# Patient Record
Sex: Female | Born: 1940
Health system: Southern US, Community
[De-identification: ages and names within clinical notes are randomized; demographics above are authoritative.]

## PROBLEM LIST (undated history)

## (undated) DIAGNOSIS — C189 Malignant neoplasm of colon, unspecified: Secondary | ICD-10-CM

## (undated) DIAGNOSIS — C50911 Malignant neoplasm of unspecified site of right female breast: Secondary | ICD-10-CM

## (undated) DIAGNOSIS — K219 Gastro-esophageal reflux disease without esophagitis: Secondary | ICD-10-CM

## (undated) DIAGNOSIS — M1990 Unspecified osteoarthritis, unspecified site: Secondary | ICD-10-CM

## (undated) DIAGNOSIS — Z9889 Other specified postprocedural states: Secondary | ICD-10-CM

## (undated) DIAGNOSIS — R7303 Prediabetes: Secondary | ICD-10-CM

## (undated) DIAGNOSIS — K635 Polyp of colon: Secondary | ICD-10-CM

## (undated) DIAGNOSIS — R112 Nausea with vomiting, unspecified: Secondary | ICD-10-CM

## (undated) DIAGNOSIS — I499 Cardiac arrhythmia, unspecified: Secondary | ICD-10-CM

## (undated) DIAGNOSIS — Z923 Personal history of irradiation: Secondary | ICD-10-CM

## (undated) DIAGNOSIS — I10 Essential (primary) hypertension: Secondary | ICD-10-CM

## (undated) DIAGNOSIS — E559 Vitamin D deficiency, unspecified: Secondary | ICD-10-CM

## (undated) DIAGNOSIS — R51 Headache: Secondary | ICD-10-CM

## (undated) DIAGNOSIS — E236 Other disorders of pituitary gland: Secondary | ICD-10-CM

## (undated) DIAGNOSIS — J189 Pneumonia, unspecified organism: Secondary | ICD-10-CM

## (undated) DIAGNOSIS — F32A Depression, unspecified: Secondary | ICD-10-CM

## (undated) DIAGNOSIS — F329 Major depressive disorder, single episode, unspecified: Secondary | ICD-10-CM

## (undated) HISTORY — DX: Vitamin D deficiency, unspecified: E55.9

## (undated) HISTORY — PX: TUMOR REMOVAL: SHX12

## (undated) HISTORY — DX: Malignant neoplasm of colon, unspecified: C18.9

## (undated) HISTORY — PX: CATARACT EXTRACTION: SUR2

## (undated) HISTORY — DX: Prediabetes: R73.03

## (undated) HISTORY — PX: BACK SURGERY: SHX140

## (undated) HISTORY — PX: EYE SURGERY: SHX253

---

## 1986-06-06 HISTORY — PX: COLON RESECTION: SHX5231

## 1989-02-04 HISTORY — PX: COLON SURGERY: SHX602

## 1997-06-06 DIAGNOSIS — I10 Essential (primary) hypertension: Secondary | ICD-10-CM

## 1997-06-06 HISTORY — DX: Essential (primary) hypertension: I10

## 1997-09-09 ENCOUNTER — Other Ambulatory Visit: Admission: RE | Admit: 1997-09-09 | Discharge: 1997-09-09 | Payer: Self-pay | Admitting: *Deleted

## 1997-09-22 ENCOUNTER — Ambulatory Visit (HOSPITAL_COMMUNITY): Admission: RE | Admit: 1997-09-22 | Discharge: 1997-09-22 | Payer: Self-pay | Admitting: Internal Medicine

## 1998-01-02 ENCOUNTER — Ambulatory Visit (HOSPITAL_COMMUNITY): Admission: RE | Admit: 1998-01-02 | Discharge: 1998-01-02 | Payer: Self-pay | Admitting: Gastroenterology

## 1998-03-02 ENCOUNTER — Ambulatory Visit (HOSPITAL_COMMUNITY): Admission: RE | Admit: 1998-03-02 | Discharge: 1998-03-02 | Payer: Self-pay | Admitting: Gastroenterology

## 1998-04-02 ENCOUNTER — Inpatient Hospital Stay (HOSPITAL_COMMUNITY): Admission: RE | Admit: 1998-04-02 | Discharge: 1998-04-06 | Payer: Self-pay | Admitting: *Deleted

## 1999-02-16 ENCOUNTER — Ambulatory Visit (HOSPITAL_COMMUNITY): Admission: RE | Admit: 1999-02-16 | Discharge: 1999-02-16 | Payer: Self-pay | Admitting: Internal Medicine

## 1999-02-16 ENCOUNTER — Encounter: Payer: Self-pay | Admitting: Internal Medicine

## 2001-05-25 ENCOUNTER — Encounter (INDEPENDENT_AMBULATORY_CARE_PROVIDER_SITE_OTHER): Payer: Self-pay | Admitting: Specialist

## 2001-05-25 ENCOUNTER — Ambulatory Visit (HOSPITAL_COMMUNITY): Admission: RE | Admit: 2001-05-25 | Discharge: 2001-05-25 | Payer: Self-pay | Admitting: Gastroenterology

## 2001-06-19 ENCOUNTER — Encounter: Payer: Self-pay | Admitting: Gastroenterology

## 2001-06-19 ENCOUNTER — Ambulatory Visit (HOSPITAL_COMMUNITY): Admission: RE | Admit: 2001-06-19 | Discharge: 2001-06-19 | Payer: Self-pay | Admitting: Gastroenterology

## 2001-07-09 ENCOUNTER — Ambulatory Visit (HOSPITAL_COMMUNITY): Admission: RE | Admit: 2001-07-09 | Discharge: 2001-07-09 | Payer: Self-pay | Admitting: Gastroenterology

## 2002-01-17 ENCOUNTER — Other Ambulatory Visit: Admission: RE | Admit: 2002-01-17 | Discharge: 2002-01-17 | Payer: Self-pay | Admitting: Internal Medicine

## 2002-06-11 ENCOUNTER — Encounter: Payer: Self-pay | Admitting: Emergency Medicine

## 2002-06-11 ENCOUNTER — Observation Stay (HOSPITAL_COMMUNITY): Admission: EM | Admit: 2002-06-11 | Discharge: 2002-06-12 | Payer: Self-pay | Admitting: Emergency Medicine

## 2003-04-14 ENCOUNTER — Ambulatory Visit (HOSPITAL_COMMUNITY): Admission: RE | Admit: 2003-04-14 | Discharge: 2003-04-14 | Payer: Self-pay | Admitting: Internal Medicine

## 2004-05-25 ENCOUNTER — Ambulatory Visit (HOSPITAL_COMMUNITY): Admission: RE | Admit: 2004-05-25 | Discharge: 2004-05-25 | Payer: Self-pay | Admitting: Internal Medicine

## 2004-06-21 ENCOUNTER — Encounter: Admission: RE | Admit: 2004-06-21 | Discharge: 2004-06-21 | Payer: Self-pay | Admitting: Orthopedic Surgery

## 2006-06-19 ENCOUNTER — Encounter: Admission: RE | Admit: 2006-06-19 | Discharge: 2006-06-19 | Payer: Self-pay | Admitting: Orthopaedic Surgery

## 2006-08-30 ENCOUNTER — Ambulatory Visit (HOSPITAL_COMMUNITY): Admission: RE | Admit: 2006-08-30 | Discharge: 2006-08-31 | Payer: Self-pay | Admitting: Orthopaedic Surgery

## 2006-11-01 ENCOUNTER — Other Ambulatory Visit: Admission: RE | Admit: 2006-11-01 | Discharge: 2006-11-01 | Payer: Self-pay | Admitting: Internal Medicine

## 2007-11-12 ENCOUNTER — Ambulatory Visit: Payer: Self-pay

## 2007-12-11 ENCOUNTER — Other Ambulatory Visit: Admission: RE | Admit: 2007-12-11 | Discharge: 2007-12-11 | Payer: Self-pay | Admitting: Internal Medicine

## 2010-10-22 NOTE — Op Note (Signed)
NAMESANDRA, [name] NO.:  [id number]   MEDICAL RECORD NO.:  [id number]          PATIENT TYPE:  OIB   LOCATION:  5031                         FACILITY:  [facility]   PHYSICIAN:  Sharolyn Douglas, M.D.        DATE OF BIRTH:  [birth year]   DATE OF PROCEDURE:  08/31/2006  DATE OF DISCHARGE:                               OPERATIVE REPORT   DIAGNOSIS:  Lumbar spinal stenosis and herniated nucleus pulposus.   PROCEDURE:  L3-4, L4-5 and L5-S1 laminectomy.   SURGEON:  Sharolyn Douglas, MD.   ASSISTANTJill Side Mahar, PA   ANESTHESIA:  General endotracheal.   ESTIMATED BLOOD LOSS:  Minimal.   COMPLICATIONS:  None.  Needle and sponge count correct.   INDICATIONS:  The patient is a pleasant female with severe left lower  extremity radicular pain and neurogenic claudication.  Her imaging  studies demonstrate a large disk rupture at L4-5 with severe stenosis.  She has moderate stenosis at L3-4 and L5-S1.  She now presents for  lumbar laminectomy and diskectomy in hopes of improving her symptoms.  Risk, benefits, alternatives reviewed.   PROCEDURE:  After informed consent she was taken to the operating room.  Underwent general endotracheal anesthesia without difficulty given  prophylactic IV antibiotics.  Carefully turned prone onto the Wilson  frame.  All bony prominences padded.  Face eyes protected at all times.  Back prepped, draped usual sterile fashion.  Midline incision was made  from L3-S1.  Dissection was carried sharply through the deep fascia.  Subperiosteal exposure carried out over the lamina.  The pars  interarticularis was identified at L3, L4 and L5 bilaterally.  Intraoperative x-ray was taken to confirm levels.  We then completed  laminectomy using Leksell rongeur, a high-speed bur and Kerrison  punches, removing the entire lamina of L4 and L5.  We removed the  inferior two thirds of the L3.  We then remove the ligamentum flavum and  completed the lateral recess  decompression of the posterior pedicles  bilaterally.  We found moderate stenosis at L3-4, severe stenosis at L4-  5 and moderate narrowing at L5-S1.  We then turned our attention to  removing the disk herniation at L4-5.  We found that the L5 nerve root  was severely compressed by the disk herniation.  We mobilized nerve root  and thecal sac medially, identified the disk herniation.  It was entered  with 50 blade.  Epstein curette used to decompress the large disk  rupture back into the interspace which was then removed with a  pituitary.  We removed as much of the loose disk material as possible  using the pituitary rongeurs.  Disk space was irrigated several times  using Angiocath irrigator.  Hemostasis was achieved.  We evaluated the  nerve roots L4, L5 and S1 and felt that they were well decompressed.  Gelfoam was left over the exposed epidural space.  Deep fascia was  closed with a running 4-0 Vicryl suture.  Subcutaneous layer closed with  0-0 Vicryl, 2-0 Vicryl followed by running 3-0 subcuticular Vicryl  suture on the skin edges.  Dermabond was applied.  The patient was  turned supine, extubated without difficulty and transferred to recovery  in stable condition able to move her upper and lower extremities.   It should be noted my assistant [facility], PA was present by  procedure including positioning, the exposure, the laminectomy.  She  also assisted with wound closure.      Sharolyn Douglas, M.D.  Electronically Signed     MC/MEDQ  D:  08/31/2006  T:  08/31/2006  Job:  [id number]

## 2010-10-22 NOTE — Procedures (Signed)
[facility]  Patient:    Kristen [name], Kristen [name] [facility] Visit Number: [id number] MRN: [id number]          Service Type: END Location: ENDO Attending Physician:  Louie Bun Dictated by:   Everardo All Madilyn Fireman, M.D. Proc. Date: 05/25/01 Admit Date:  05/25/2001   CC:         Marinus Maw, M.D.   Procedure Report  PROCEDURE: Esophagogastroduodenoscopy with esophageal dilatation.  INDICATIONS FOR PROCEDURE:  Patient undergoing surveillance colonoscopy who has noted an increasing solid food dysphagia with some episodes of forced regurgitation.  DESCRIPTION OF PROCEDURE:  The patient was placed in the left lateral decubitus position then placed on the pulse monitor with continuous low flow oxygen delivered by nasal cannula. She was sedated with 50 mg IV Demerol and 7 mg IV Versed. The Olympus video endoscope was advanced under direct vision into the oropharynx and esophagus. The esophagus was straight and of normal caliber with the squamocolumnar line sharply identified at 38 cm. There was no visible ring, stricture or other abnormality of the distal esophagus or gastroesophageal junction. However there was a significant amount of pooled saliva in the esophagus. All of this raised the question of possible acolasia. The scope passed through the gastroesophageal junction easily and retroflexed view of the cardia revealed no abnormality. The fundus, body, antrum and pylorus all appeared normal. The duodenum was entered and both the bulb and second portion were well inspected and appeared to be within normal limits. A guidewire was placed through the endoscope channel and the scope withdrawn. A single 17 mm Savary dilator was passed over the guidewire without any significant resistance and no blood seen on withdrawal. The patient was then prepared for colonoscopy to be done immediately after this procedure. She tolerated the procedure well and there were  no immediate complications.  IMPRESSION:  Essentially normal esophagus with no visible ring or stricture to account for her dysphagia status post empiric dilatation.  PLAN:  If does not respond to todays therapy will need workup for acolasia with barium swallow and possibly manometry. Dictated by:   Everardo All Madilyn Fireman, M.D. Attending Physician:  Louie Bun DD:  05/25/01 TD:  05/26/01 Job: [id number]

## 2010-10-22 NOTE — Procedures (Signed)
[facility]  Patient:    Kristen [name], Kristen [name] [facility] Visit Number: [id number] MRN: [id number]          Service Type: END Location: ENDO Attending Physician:  Louie Bun Dictated by:   Everardo All Madilyn Fireman, M.D. Proc. Date: 05/25/01 Admit Date:  05/25/2001   CC:         Marinus Maw, M.D.   Procedure Report  PROCEDURE:  Colonoscopy with polypectomy.  INDICATION FOR PROCEDURE:  History of sessile proximal cecal polyp requiring surgical removal one year ago.  DESCRIPTION OF PROCEDURE:  The patient was placed in the left lateral decubitus position then placed on the pulse monitor with continuous low flow oxygen delivered by nasal cannula. She was sedated with 20 mg IV Demerol and 1 mg IV Versed in addition to the medicine given for the previous EGD. The Olympus video colonoscope was inserted into the rectum and advanced to the cecum, confirmed by transillumination at McBurneys point and visualization of the ileocecal valve and appendiceal orifice. The prep was good. The cecum appeared normal. Within the ascending colon, there was an 8 mm bilobulated polyp which was removed by snare. The remainder of the ascending and transverse colon appeared normal. I should mention that although there appeared to be very little length of resection, there was an ileocolonic anastomosis that was easily identified rather than intact cecum. Within the descending colon, there was seen one more sessile polyp removed by hot biopsy. In the proximal rectum, two diminutive polyps were fulgurated in place with the hot biopsy forceps. No other abnormalities were seen down to the anus. The colonoscope was then withdrawn and the patient returned to the recovery room in stable condition. The patient tolerated the procedure well and there were no immediate complications.  IMPRESSION:  1. Several small colon polyps.  2. Intact ileocolonic anastomosis.  PLAN:  Await biopsy  results and will probably pursue repeat colonoscopy in three years. Dictated by:   Everardo All Madilyn Fireman, M.D. Attending Physician:  Louie Bun DD:  05/25/01 TD:  05/26/01 Job: [id number]

## 2010-10-22 NOTE — Discharge Summary (Signed)
[name], [name]                ACCOUNT NO.:  [id number]   MEDICAL RECORD NO.:  [id number]                   PATIENT TYPE:  INP   LOCATION:  2853                                 FACILITY:  [facility]   PHYSICIAN:  Nanetta Batty, M.D.                DATE OF BIRTH:  Feb 29, 1941   DATE OF ADMISSION:  06/11/2002  DATE OF DISCHARGE:  06/12/2002                                 DISCHARGE SUMMARY   ADMISSION DIAGNOSIS:  1. Unstable angina.  2. Hypertension.  3. Gastroesophageal reflux disease.  4. Insomnia/anxiety.   DISCHARGE DIAGNOSES:  1. Chest pain, questionable etiology, probable gastrointestinal.     a. Status post cardiac catheterization, June 12, 2002, which revealed        no coronary artery disease and normal left ventricular function.  2. Hypertension.  3. Gastroesophageal reflux disease.  4. Insomnia/anxiety.   HISTORY OF PRESENT ILLNESS:  The patient is a 70 year old white female who  has no prior cardiac history and no cardiac risk factors except for  hypertension, who presented to [facility] on June 11, 2002 with  complaints of chest pain.   She stated that her chest pain began at about 9 o'clock that morning.  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  She described it as a crushing and pressure.  She had radiation up to her  right neck.  She became diaphoretic, had not developed nausea and emesis.  There was no shortness of breath.  She then developed pain in her left  shoulder to her left elbow and then both arms felt heavy and she felt  extremely weak.   She sat down but had no relief and after about 30 minutes, it finally began  to ease somewhat.  She went home and continued to rest.  However, after a  while, the pressure seemed to be increasing again.   She tried position  changes but had no relief.  She took Maalox and had no relief.  She  continued to feel weak and did not feel well and contacted her primary care  physician, who referred her to the emergency room.   She said that she was given four baby aspirins but no nitroglycerins.  At  the time of our interview, she was having very slight chest discomfort.   At that time, she was stable with blood pressure 140/76, heart rate was 58,  no significant findings on physical exam.  EKG showed sinus rhythm with no  significant ST-T changes.  First set of enzymes was negative and other labs  were stable.   At that time, she was seen and evaluated by Dr. Nanetta Batty.  It was felt  that chest pain was consistent  with unstable angina.  She is currently pain-  free and EKG and enzymes are stable.  We plan to admit her to telemetry,  check serial enzymes and rule out MI.  She will be placed on aspirin, beta  blocker, heparin and nitroglycerin and we plan for definitive cardiac  catheterization in the morning.   HOSPITAL COURSE:  On June 12, 2002, she underwent cardiac catheterization  by Dr. Darlin Priestly.  She was found to have normal coronary arteries,  normal LV function with EF 60%.  She tolerated the procedure well and had no  complications.  He planned for discharge home later in the day if her  balloon site was stable, planned proton pump inhibitor and possible GI  evaluation if she continued to have chest pain.   Later in the afternoon of June 12, 2002, the patient remained stable.  Groin site is well-healed without hematoma or bleed, she has good peripheral  pulses and has remained hemodynamically stable.  She is now deemed stable  for discharge home.   HOSPITAL CONSULTATIONS:  None.   HOSPITAL PROCEDURES:  Cardiac catheterization on June 12, 2002 by Dr.  Lenise Herald.  This revealed normal coronary arteries, normal left  ventricular function, ejection fraction  60%.  She tolerated the procedure  well and had no complications.   LABORATORY DATA:  I do not have a full set of labs available in the chart at  this time, however, at admission, white count was 7.3, hemoglobin 12.0,  hematocrit 35.6, platelets 243,000.  Sodium was 138, potassium 3.7, chloride  106, BUN 12, creatinine 0.6.  Cardiac enzymes are all negative -- CK is 78,  MB 1.6, troponin less than 0.01.   EKG shows normal sinus rhythm, no ST-T change.   DISCHARGE MEDICATIONS:  1. Protonix 40 mg once a day.  2. Medroxyprogesterone 10 mg q.h.s.  3. Furosemide 40 mg a day.  4. Bisoprolol fumarate 10 mg a day.  5. Fluoxetine HCl 40 mg a day.  6. Estradiol 1 mg tablet -- a half tablet a day.   FOLLOWUP:  She is scheduled to see [place] back in the office for a groin check  on January 13th at 9 a.m.   We have reviewed post-catheterization instructions with her.   ACTIVITY:  No strenuous activity, lifting greater than 5 pounds, driving or  sexual activity for three days.    SPECIAL DISCHARGE INSTRUCTIONS:  Contact our office at [phone] if you have  any bleeding or increased pain in your groin site.      Mary B. Remer Macho, P.A.-C.                   Nanetta Batty, M.D.    MBE/MEDQ  D:  06/12/2002  T:  06/13/2002  Job:  [id number]   cc:   Lucky Cowboy, M.D.  [address], [address]  [place], [place] [address]  Fax: [phone]

## 2010-10-22 NOTE — Cardiovascular Report (Signed)
Kristen [name], Kristen [name]                ACCOUNT NO.:  [id number]   MEDICAL RECORD NO.:  [id number]                   [name] TYPE:  INP   LOCATION:  4733                                 FACILITY:  [facility]   PHYSICIAN:  Darlin Priestly, M.D.             DATE OF BIRTH:  07-15-1940   DATE OF PROCEDURE:  06/12/2002  DATE OF DISCHARGE:                              CARDIAC CATHETERIZATION   PROCEDURES:  1. Left heart catheterization.  2. Coronary angiography.  3. Left ventriculogram.   COMPLICATIONS:  None.   INDICATIONS:  Kristen [name] is a 70 year old female, [name] of Dr. Oneta Rack,  admitted on June 11, 2002, with unstable angina.  Kristen [name] developed  substernal chest pain with radiation to Kristen right side of neck associated  with nausea and diaphoresis.  She is subsequently referred for cardiac  catheterization to rule out significant CAD.   DESCRIPTION OF PROCEDURE:  After given informed written consent, Kristen [name]  was brought to Kristen cardiac catheterization lab where his right and left  groins were shaved, prepped, and draped in Kristen usual sterile fashion.  ECG  monitoring was established.  Using modified Seldinger technique, a #6 French  arterial sheath was inserted in Kristen right femoral artery.  A 6 French  diagnostic catheter was then used to perform diagnostic angiography.  This  reveals a long left main with no significant disease.   Kristen LAD is a medium sized vessel which coursed to Kristen apex and gave rise to  two diagonal branches.  Kristen LAD has mild irregularities and high-grade  stenosis.  Kristen first and second diagonals are medium sized vessels with no  significant disease.   Kristen left circumflex is a medium sized vessel which coursed in Kristen AV groove  and gave rise to two obtuse marginal branches.  Kristen AV groove circumflex has  no significant discharge.  Kristen first OM is a large vessel which bifurcates  distally and has no significant disease.  Kristen second OM is  a medium sized  vessel which bifurcates distally and has no significant disease.   Kristen right coronary artery is a large vessel which is dominant and gives rise  to both Kristen PDA as well as Kristen posterolateral branch.  There is no  significant disease in Kristen RCA, PDA, or posterolateral branch.   LEFT VENTRICULOGRAM:  Kristen left ventriculogram reveals preserved EF of 60%.    CONCLUSIONS:  1. No significant coronary artery disease.  2. Normal left ventricular systolic function.                                                  Darlin Priestly, M.D.    RHM/MEDQ  D:  06/12/2002  T:  06/12/2002  Job:  [id number]   cc:   Lucky Cowboy, M.D.  [address],  [address]  [place], [place] [address]  Fax: [phone]   Nanetta Batty, M.D.  [phone] [address]  [place]  [place] [address]  Fax: [phone]

## 2010-10-22 NOTE — Op Note (Signed)
[facility]  Patient:    Kristen [name], Kristen [name] [facility] Visit Number: [id number] MRN: [id number]          Service Type: END Location: ENDO Attending Physician:  Louie Bun Dictated by:   Everardo All Madilyn Fireman, M.D. Proc. Date: 07/09/01 Admit Date:  07/09/2001 Discharge Date: 07/09/2001   CC:         Marinus Maw, M.D.   Operative Report  PROCEDURE:  Esophageal motility study.  INDICATION FOR PROCEDURE:  Dysphagia with EGD and barium swallow suggesting achalasia.  RESULTS:  I. ESOPHAGEAL BODY:  There is 0% peristalsis with approximately 50% each     simultaneous and retrograde contractions.  The contractions are of normal     velocity and duration but borderline low amplitude in all leads.  II. LOWER ESOPHAGEAL SPHINcTER:  Rest LES pressures 45.5% which is minimally     elevated.  Relaxation is 97% which is normal.  IMPRESSION:  Severe dysmotility of esophageal body with borderline hypertensive lower esophageal sphincter.  Overall this does not meet criteria for a full blown achalasia although, I suspect, manometrically her conditions could be defined as diffuse esophageal spasm with probable transposition to achalasia.  PLAN:  Will review treatment options with the patient.  Will hold off on proceeding with Hellers myotomy due to the lack of full criteria for achalasia.  Will probably initiate a calcium channel blocker and consider Botox injection initially. Dictated by:   Everardo All Madilyn Fireman, M.D. Attending Physician:  Louie Bun DD:  07/18/01 TD:  07/18/01 Job: [id number]

## 2011-04-25 ENCOUNTER — Other Ambulatory Visit (HOSPITAL_COMMUNITY): Payer: Self-pay | Admitting: Internal Medicine

## 2011-04-25 ENCOUNTER — Ambulatory Visit (HOSPITAL_COMMUNITY)
Admission: RE | Admit: 2011-04-25 | Discharge: 2011-04-25 | Disposition: A | Discharge status: Home / Self Care | Payer: Medicare Other | Source: Ambulatory Visit | Attending: Internal Medicine | Admitting: Internal Medicine

## 2011-04-25 DIAGNOSIS — W19XXXA Unspecified fall, initial encounter: Secondary | ICD-10-CM | POA: Insufficient documentation

## 2011-04-25 DIAGNOSIS — Z1231 Encounter for screening mammogram for malignant neoplasm of breast: Secondary | ICD-10-CM

## 2011-04-25 DIAGNOSIS — M25519 Pain in unspecified shoulder: Secondary | ICD-10-CM

## 2011-04-25 DIAGNOSIS — M546 Pain in thoracic spine: Secondary | ICD-10-CM | POA: Insufficient documentation

## 2011-05-13 ENCOUNTER — Ambulatory Visit (HOSPITAL_COMMUNITY)
Admission: RE | Admit: 2011-05-13 | Discharge: 2011-05-13 | Disposition: A | Discharge status: Home / Self Care | Payer: Medicare Other | Source: Ambulatory Visit | Attending: Internal Medicine | Admitting: Internal Medicine

## 2011-05-13 DIAGNOSIS — Z1231 Encounter for screening mammogram for malignant neoplasm of breast: Secondary | ICD-10-CM | POA: Insufficient documentation

## 2011-05-17 ENCOUNTER — Other Ambulatory Visit: Payer: Self-pay | Admitting: Internal Medicine

## 2011-05-17 DIAGNOSIS — R928 Other abnormal and inconclusive findings on diagnostic imaging of breast: Secondary | ICD-10-CM

## 2011-06-01 ENCOUNTER — Ambulatory Visit
Admission: RE | Admit: 2011-06-01 | Discharge: 2011-06-01 | Disposition: A | Discharge status: Home / Self Care | Payer: No Typology Code available for payment source | Source: Ambulatory Visit | Attending: Internal Medicine | Admitting: Internal Medicine

## 2011-06-01 DIAGNOSIS — R928 Other abnormal and inconclusive findings on diagnostic imaging of breast: Secondary | ICD-10-CM

## 2011-07-14 DIAGNOSIS — K5730 Diverticulosis of large intestine without perforation or abscess without bleeding: Secondary | ICD-10-CM | POA: Diagnosis not present

## 2011-07-14 DIAGNOSIS — Z09 Encounter for follow-up examination after completed treatment for conditions other than malignant neoplasm: Secondary | ICD-10-CM | POA: Diagnosis not present

## 2011-07-14 DIAGNOSIS — Z86010 Personal history of colonic polyps: Secondary | ICD-10-CM | POA: Diagnosis not present

## 2011-07-14 DIAGNOSIS — Z980 Intestinal bypass and anastomosis status: Secondary | ICD-10-CM | POA: Diagnosis not present

## 2011-07-14 DIAGNOSIS — D126 Benign neoplasm of colon, unspecified: Secondary | ICD-10-CM | POA: Diagnosis not present

## 2011-07-14 DIAGNOSIS — Z800 Family history of malignant neoplasm of digestive organs: Secondary | ICD-10-CM | POA: Diagnosis not present

## 2011-08-19 DIAGNOSIS — D126 Benign neoplasm of colon, unspecified: Secondary | ICD-10-CM | POA: Diagnosis not present

## 2011-08-24 DIAGNOSIS — I10 Essential (primary) hypertension: Secondary | ICD-10-CM | POA: Diagnosis not present

## 2011-08-24 DIAGNOSIS — E559 Vitamin D deficiency, unspecified: Secondary | ICD-10-CM | POA: Diagnosis not present

## 2011-08-24 DIAGNOSIS — E782 Mixed hyperlipidemia: Secondary | ICD-10-CM | POA: Diagnosis not present

## 2011-08-24 DIAGNOSIS — R7309 Other abnormal glucose: Secondary | ICD-10-CM | POA: Diagnosis not present

## 2011-08-24 DIAGNOSIS — Z79899 Other long term (current) drug therapy: Secondary | ICD-10-CM | POA: Diagnosis not present

## 2011-09-20 ENCOUNTER — Encounter (HOSPITAL_COMMUNITY)
Admission: RE | Disposition: A | Discharge status: Home Health | Payer: Self-pay | Source: Ambulatory Visit | Attending: Gastroenterology

## 2011-09-20 ENCOUNTER — Encounter (HOSPITAL_COMMUNITY): Payer: Self-pay | Admitting: *Deleted

## 2011-09-20 ENCOUNTER — Ambulatory Visit (HOSPITAL_COMMUNITY)
Admission: RE | Admit: 2011-09-20 | Discharge: 2011-09-20 | Disposition: A | Discharge status: Home Health | Payer: Medicare Other | Source: Ambulatory Visit | Attending: Gastroenterology | Admitting: Gastroenterology

## 2011-09-20 DIAGNOSIS — K219 Gastro-esophageal reflux disease without esophagitis: Secondary | ICD-10-CM | POA: Diagnosis not present

## 2011-09-20 DIAGNOSIS — I10 Essential (primary) hypertension: Secondary | ICD-10-CM | POA: Diagnosis not present

## 2011-09-20 DIAGNOSIS — Z79899 Other long term (current) drug therapy: Secondary | ICD-10-CM | POA: Diagnosis not present

## 2011-09-20 DIAGNOSIS — D126 Benign neoplasm of colon, unspecified: Secondary | ICD-10-CM | POA: Diagnosis not present

## 2011-09-20 HISTORY — PX: HOT HEMOSTASIS: SHX5433

## 2011-09-20 HISTORY — DX: Gastro-esophageal reflux disease without esophagitis: K21.9

## 2011-09-20 HISTORY — PX: COLONOSCOPY: SHX5424

## 2011-09-20 HISTORY — DX: Pneumonia, unspecified organism: J18.9

## 2011-09-20 HISTORY — DX: Major depressive disorder, single episode, unspecified: F32.9

## 2011-09-20 HISTORY — DX: Depression, unspecified: F32.A

## 2011-09-20 HISTORY — DX: Essential (primary) hypertension: I10

## 2011-09-20 HISTORY — DX: Headache: R51

## 2011-09-20 SURGERY — COLONOSCOPY
Anesthesia: Moderate Sedation

## 2011-09-20 MED ORDER — MIDAZOLAM HCL 5 MG/5ML IJ SOLN
INTRAMUSCULAR | Status: DC | PRN
  Administered 2011-09-20 (×4): 2.5 mg via INTRAVENOUS

## 2011-09-20 MED ORDER — FENTANYL CITRATE 0.05 MG/ML IJ SOLN
INTRAMUSCULAR | Status: AC
  Filled 2011-09-20: qty 4

## 2011-09-20 MED ORDER — FENTANYL NICU IV SYRINGE 50 MCG/ML
INJECTION | INTRAMUSCULAR | Status: DC | PRN
  Administered 2011-09-20 (×4): 25 ug via INTRAVENOUS

## 2011-09-20 MED ORDER — SODIUM CHLORIDE 0.9 % IV SOLN
Freq: Once | INTRAVENOUS | Status: AC
  Administered 2011-09-20: 500 mL via INTRAVENOUS

## 2011-09-20 MED ORDER — MIDAZOLAM HCL 10 MG/2ML IJ SOLN
INTRAMUSCULAR | Status: AC
  Filled 2011-09-20: qty 4

## 2011-09-20 NOTE — Discharge Instructions (Addendum)
No aspirin or ibuprofen for 2 weeks. Dr. Madilyn Fireman will call with pathology results and further recommendations.  YOU HAD AN ENDOSCOPIC PROCEDURE TODAY: Refer to the procedure report that was given to you for any specific questions about what was found during the examination.  If the procedure report does not answer your questions, please call your gastroenterologist to clarify.  YOU SHOULD EXPECT: Some feelings of bloating in the abdomen. Passage of more gas than usual.  Walking can help get rid of the air that was put into your GI tract during the procedure and reduce the bloating. If you had a lower endoscopy (such as a colonoscopy or flexible sigmoidoscopy) you may notice spotting of blood in your stool or on the toilet paper.   DIET: Your first meal following the procedure should be a light meal and then it is ok to progress to your normal diet.  A half-sandwich or bowl of soup is an example of a good first meal.  Heavy or fried foods are harder to digest and may make you feel nasueas or bloated.  Drink plenty of fluids but you should avoid alcoholic beverages for 24 hours.  ACTIVITY: Your care partner should take you home directly after the procedure.  You should plan to take it easy, moving slowly for the rest of the day.  You can resume normal activity the day after the procedure however you should NOT DRIVE or use heavy machinery for 24 hours (because of the sedation medicines used during the test).    SYMPTOMS TO REPORT IMMEDIATELY  A gastroenterologist can be reached at any hour.  Please call your doctor's office for any of the following symptoms:   Following lower endoscopy (colonoscopy, flexible sigmoidoscopy)  Excessive amounts of blood in the stool  Significant tenderness, worsening of abdominal pains  Swelling of the abdomen that is new, acute  Fever of 100 or higher  Following upper endoscopy (EGD, EUS, ERCP)  Vomiting of blood or coffee ground material  New, significant abdominal  pain  New, significant chest pain or pain under the shoulder blades  Painful or persistently difficult swallowing  New shortness of breath  Black, tarry-looking stools  FOLLOW UP: If any biopsies were taken you will be contacted by phone or by letter within the next 1-3 weeks.  Call your gastroenterologist if you have not heard about the biopsies in 3 weeks.  Please also call your gastroenterologist's office with any specific questions about appointments or follow up tests.

## 2011-09-20 NOTE — Op Note (Signed)
[facility] [address] [place], [place]  [address]  COLONOSCOPY PROCEDURE REPORT  PATIENT:  Kristen [name], Kristen [name]  MR#:  [id number] BIRTHDATE:  1940/11/15, 70 yrs. old  GENDER:  female ENDOSCOPIST:  Dorena Cookey REF. BY: PROCEDURE DATE:  09/20/2011 PROCEDURE: ASA CLASS: INDICATIONS: MEDICATIONS:  Fentanyl 100 mcg, Versed 10 mg DESCRIPTION OF PROCEDURE:   After the risks benefits and alternatives of the procedure were thoroughly explained, informed consent was obtained.  The Pentax Ped Colon [id number] endoscope was introduced through the anus and advanced to the , without limitations.  The quality of the prep was .  The instrument was then slowly withdrawn as the colon was fully examined. <<PROCEDUREIMAGES>>  FINDINGS: There were 2 multilobular soft sessile polyps both overlying a haustral ridge in the proximal colon just distal to the ileocolonic anastomosis. They both straddled the same ridge but were separate from each other. The larger one was estimated to be about 2-1/2 cm in diameter overall and the smaller one about 1-1/2-2 cm. Both of these were very difficult to address endoscopically due to difficulty in positioning and the tethering of this soft slippery polyp tissue over the ridge. With considerable difficulty a significant portion of both polyps were sliced off with the snare and sent in separate specimen containers. Also the surface of the smaller polyp was cauterized with a closed hot biopsy forceps with the possibility that this polyp might have been completely destroyed although that is felt unlikely. A larger one could not be completely removed during this session.  COMPLICATIONS:  None immediate  IMPRESSION: Persistent bulky soft polypoid masses of the proximal colon near previous ileocolonic anastomosis, rule out malignancy.  RECOMMENDATIONS: Await histology and if high-grade dysplasia or carcinoma will recommend surgery. I do not think that  these lesions are likely amenable to complete endoscopic resection, and we'll discuss this with patient. I am willing to try one or 2 more attempts if she chooses, otherwise will refer for elective surgical resection.  ______________________________ Dorena Cookey  CC:  n. eSIGNEDDorena Cookey at 09/20/2011 11:25 AM  Mitzie Na, [phone]

## 2011-09-20 NOTE — H&P (Signed)
   [facility] Admission History & Physical  Chief Complaint:  here for a colonoscopy  HPI: Kristen [name] is an 71 y.o. White  female.   who presents for followup colonoscopy after recent colonoscopy revealed a bulky soft sessile mass at the previous ileocolonic anastomosis. Histology was negative for malignancy. The patient was offered a options of surgery versus repeat colonoscopy with attempted complete destruction of the lesion which the likelihood of which is uncertain. She has no new complaints   Past Medical History  Diagnosis Date  . Hypertension   . Shortness of breath     exertion  . Pneumonia     hx of pneumonia  . GERD (gastroesophageal reflux disease)   . Headache     hx migraines  . Depression     Past Surgical History  Procedure Date  . Colon resection 1988    Medications Prior to Admission  Medication Dose Route Frequency Provider Last Rate Last Dose  . 0.9 %  sodium chloride infusion   Intravenous Once Barrie Folk, MD 20 mL/hr at 09/20/11 0500 500 mL at 09/20/11 0500  . DISCONTD: fentaNYL NICU IV Syringe 50 mcg/mL    PRN Barrie Folk, MD   25 mcg at 09/20/11 1050  . DISCONTD: midazolam (VERSED) 5 MG/5ML injection    PRN Barrie Folk, MD   2.5 mg at 09/20/11 1050   Medications Prior to Admission  Medication Sig Dispense Refill  . aspirin 81 MG tablet Take 81 mg by mouth 2 (two) times daily.      Marland Kitchen atenolol (TENORMIN) 100 MG tablet Take 100 mg by mouth daily.      . fenofibrate micronized (LOFIBRA) 134 MG capsule Take 134 mg by mouth daily before breakfast.      . PARoxetine (PAXIL) 40 MG tablet Take 40 mg by mouth every morning.      . pravastatin (PRAVACHOL) 40 MG tablet Take 40 mg by mouth daily.      . ranitidine (ZANTAC) 300 MG capsule Take 300 mg by mouth every evening.        Allergies:  Allergies  Allergen Reactions  . Codeine Nausea And Vomiting    History reviewed. No pertinent family history.  Social History:  reports that she  has never smoked. She does not have any smokeless tobacco history on file. She reports that she does not drink alcohol or use illicit drugs.  Review of Systems: negative except  as above    Blood pressure 121/72, temperature 98.3 F (36.8 [name]), temperature source Oral, resp. rate 20, height 5\' 6"  (1.676 m), weight 84.823 kg (187 lb), SpO2 96.00%. Head: Normocephalic, without obvious abnormality, atraumatic NeAbdomen soft nondistended nontender history of incompletely resected ileocolonic polypoid mass history of incompletely resected ileocolonic polypoid mass history of incompletely resected ileocolonic polypoid mass proceed to colonoscopy abdomen soft, nontenderck: no adenopathy, no carotid bruit, no JVD, supple, symmetrical, trachea midline and thyroid not enlarged, symmetric, no tenderness/mass/nodules Resp: clear to auscultation bilaterally Cardio: regular rate and rhythm, S1, S2 normal, no murmur, click, rub or gallop [id number] soft, nontender.Extremities: extremities normal, atraumatic, no cyanosis or edema  No results found for this or any previous visit (from the past 48 hour(s)). No results found.  Assessment: Incompletely resected polypoid mass  Plan: Procede with colonoscopy  Kristen [name] 09/20/2011, 11:10 AM

## 2011-09-21 ENCOUNTER — Encounter (HOSPITAL_COMMUNITY): Payer: Self-pay | Admitting: Gastroenterology

## 2011-09-21 ENCOUNTER — Encounter (HOSPITAL_COMMUNITY): Payer: Self-pay

## 2011-11-07 DIAGNOSIS — H2510 Age-related nuclear cataract, unspecified eye: Secondary | ICD-10-CM | POA: Diagnosis not present

## 2011-11-23 DIAGNOSIS — E782 Mixed hyperlipidemia: Secondary | ICD-10-CM | POA: Diagnosis not present

## 2011-11-23 DIAGNOSIS — I10 Essential (primary) hypertension: Secondary | ICD-10-CM | POA: Diagnosis not present

## 2011-11-23 DIAGNOSIS — E559 Vitamin D deficiency, unspecified: Secondary | ICD-10-CM | POA: Diagnosis not present

## 2011-11-23 DIAGNOSIS — J40 Bronchitis, not specified as acute or chronic: Secondary | ICD-10-CM | POA: Diagnosis not present

## 2011-11-23 DIAGNOSIS — R7309 Other abnormal glucose: Secondary | ICD-10-CM | POA: Diagnosis not present

## 2011-11-24 DIAGNOSIS — H2510 Age-related nuclear cataract, unspecified eye: Secondary | ICD-10-CM | POA: Diagnosis not present

## 2011-11-28 DIAGNOSIS — H2510 Age-related nuclear cataract, unspecified eye: Secondary | ICD-10-CM | POA: Diagnosis not present

## 2011-11-28 DIAGNOSIS — IMO0002 Reserved for concepts with insufficient information to code with codable children: Secondary | ICD-10-CM | POA: Diagnosis not present

## 2011-12-07 DIAGNOSIS — H2510 Age-related nuclear cataract, unspecified eye: Secondary | ICD-10-CM | POA: Diagnosis not present

## 2011-12-12 DIAGNOSIS — H2510 Age-related nuclear cataract, unspecified eye: Secondary | ICD-10-CM | POA: Diagnosis not present

## 2011-12-12 DIAGNOSIS — IMO0002 Reserved for concepts with insufficient information to code with codable children: Secondary | ICD-10-CM | POA: Diagnosis not present

## 2011-12-15 ENCOUNTER — Ambulatory Visit (HOSPITAL_COMMUNITY)
Admission: RE | Admit: 2011-12-15 | Discharge: 2011-12-15 | Disposition: A | Discharge status: Home / Self Care | Payer: Medicare Other | Source: Ambulatory Visit | Attending: Gastroenterology | Admitting: Gastroenterology

## 2011-12-15 ENCOUNTER — Encounter (HOSPITAL_COMMUNITY): Payer: Self-pay | Admitting: *Deleted

## 2011-12-15 ENCOUNTER — Encounter (HOSPITAL_COMMUNITY)
Admission: RE | Disposition: A | Discharge status: Home / Self Care | Payer: Self-pay | Source: Ambulatory Visit | Attending: Gastroenterology

## 2011-12-15 DIAGNOSIS — D126 Benign neoplasm of colon, unspecified: Secondary | ICD-10-CM | POA: Diagnosis not present

## 2011-12-15 DIAGNOSIS — I10 Essential (primary) hypertension: Secondary | ICD-10-CM | POA: Diagnosis not present

## 2011-12-15 DIAGNOSIS — D649 Anemia, unspecified: Secondary | ICD-10-CM

## 2011-12-15 DIAGNOSIS — K219 Gastro-esophageal reflux disease without esophagitis: Secondary | ICD-10-CM | POA: Diagnosis not present

## 2011-12-15 DIAGNOSIS — Z9049 Acquired absence of other specified parts of digestive tract: Secondary | ICD-10-CM | POA: Diagnosis not present

## 2011-12-15 HISTORY — PX: COLONOSCOPY: SHX5424

## 2011-12-15 SURGERY — COLONOSCOPY
Anesthesia: Moderate Sedation

## 2011-12-15 MED ORDER — FENTANYL CITRATE 0.05 MG/ML IJ SOLN
INTRAMUSCULAR | Status: DC | PRN
  Administered 2011-12-15 (×2): 25 ug via INTRAVENOUS
  Administered 2011-12-15: 12.5 ug via INTRAVENOUS

## 2011-12-15 MED ORDER — MIDAZOLAM HCL 10 MG/2ML IJ SOLN
INTRAMUSCULAR | Status: AC
  Filled 2011-12-15: qty 4

## 2011-12-15 MED ORDER — MIDAZOLAM HCL 10 MG/2ML IJ SOLN
INTRAMUSCULAR | Status: DC | PRN
  Administered 2011-12-15 (×2): 2 mg via INTRAVENOUS
  Administered 2011-12-15: 1 mg via INTRAVENOUS

## 2011-12-15 MED ORDER — DIPHENHYDRAMINE HCL 50 MG/ML IJ SOLN
INTRAMUSCULAR | Status: AC
  Filled 2011-12-15: qty 1

## 2011-12-15 MED ORDER — SODIUM CHLORIDE 0.9 % IV SOLN
Freq: Once | INTRAVENOUS | Status: AC
  Administered 2011-12-15: 09:00:00 via INTRAVENOUS

## 2011-12-15 MED ORDER — ASPIRIN 81 MG PO TABS: 81.0000 mg | ORAL_TABLET | Freq: Two times a day (BID) | ORAL | Status: DC

## 2011-12-15 MED ORDER — FENTANYL CITRATE 0.05 MG/ML IJ SOLN
INTRAMUSCULAR | Status: AC
  Filled 2011-12-15: qty 4

## 2011-12-15 NOTE — H&P (Signed)
   [facility] Admission History & Physical  Chief Complaint: Patient here for colonoscopy HPI: Kristen [name] is an 71 y.o. white female.  Status post colonoscopy with incomplete resection of a flat carpet shaped villous adenoma in February 2013. There is no dysplasia or malignancy and she presents for repeat colonoscopy with attempted complete ablation of the adenoma.  Past Medical History  Diagnosis Date  . Hypertension   . Shortness of breath     exertion  . Pneumonia     hx of pneumonia  . GERD (gastroesophageal reflux disease)   . Headache     hx migraines  . Depression     Past Surgical History  Procedure Date  . Colon resection 1988  . Colonoscopy 09/20/2011    Procedure: COLONOSCOPY;  Surgeon: Barrie Folk, MD;  Location: [facility];  Service: Endoscopy;  Laterality: N/A;  . Hot hemostasis 09/20/2011    Procedure: HOT HEMOSTASIS (ARGON PLASMA COAGULATION/BICAP);  Surgeon: Barrie Folk, MD;  Location: [facility];  Service: Endoscopy;  Laterality: N/A;  . Cataract extraction June/july 2013    bilateral eyes    Medications Prior to Admission  Medication Sig Dispense Refill  . aspirin 81 MG tablet Take 81 mg by mouth 2 (two) times daily.      Marland Kitchen atenolol (TENORMIN) 100 MG tablet Take 100 mg by mouth daily.      . cholecalciferol (VITAMIN D) 1000 UNITS tablet Take 1,000 Units by mouth daily.      . fenofibrate micronized (LOFIBRA) 134 MG capsule Take 134 mg by mouth daily before breakfast.      . PARoxetine (PAXIL) 40 MG tablet Take 40 mg by mouth every morning.      . pravastatin (PRAVACHOL) 40 MG tablet Take 40 mg by mouth daily.      . ranitidine (ZANTAC) 300 MG capsule Take 300 mg by mouth every evening.        Allergies:  Allergies  Allergen Reactions  . Codeine Nausea And Vomiting    History reviewed. No pertinent family history.  Social History:  reports that she has never smoked. She does not have any smokeless tobacco history on file. She  reports that she drinks alcohol. She reports that she does not use illicit drugs.  Review of Systems: negative except as above   Blood pressure 127/70, temperature 97.2 F (36.2 C), temperature source Oral, resp. rate 21, height 5\' 6"  (1.676 m), weight 81.647 kg (180 lb), SpO2 100.00%. Head: Normocephalic, without obvious abnormality, atraumatic Neck: no adenopathy, no carotid bruit, no JVD, supple, symmetrical, trachea midline and thyroid not enlarged, symmetric, no tenderness/mass/nodules Resp: clear to auscultation bilaterally Cardio: regular rate and rhythm, S1, S2 normal, no murmur, click, rub or gallop GI: Soft nondistended with normoactive bowel sounds. Extremities: extremities normal, atraumatic, no cyanosis or edema  No results found for this or any previous visit (from the past 48 hour(s)). No results found.  Assessment: Incompletely resected colon polyp Plan: Proceed with colonoscopy.  Shonta Bourque C 12/15/2011, 9:13 AM

## 2011-12-15 NOTE — Op Note (Signed)
[facility] [address] [place], [place]  [address]  COLONOSCOPY PROCEDURE REPORT  PATIENT:  Kristen, [name]  MR#:  [id number] BIRTHDATE:  [birth year], 70 yrs. old  GENDER:  female ENDOSCOPIST:  Dorena Cookey REF. BY: PROCEDURE DATE:  12/15/2011 PROCEDURE: ASA CLASS: INDICATIONS:   and completely resected anastomotic colon polyp MEDICATIONS:  Fentanyl 62-1/2 mcg, Versed 5 mg  DESCRIPTION OF PROCEDURE:   After the risks benefits and alternatives of the procedure were thoroughly explained, informed consent was obtained.  The Pentax Ped Colon [id number] endoscope was introduced through the anus and advanced to the , without limitations.  The quality of the prep was .  The instrument was then slowly withdrawn as the colon was fully examined. <<PROCEDUREIMAGES>>  FINDINGS: 6-8 mm sessile polyp removed by snare just distal to the anastomosis. Larger 12 x 8 mm polyp astride the same fold several fragments removed by snare in piecemeal fashion. Attempted to fulgurate the rest with the argon plasma coagulator. Doubt it was fully fulgurated.  COMPLICATIONS:  None immediate  IMPRESSION: Further resection of previously seen. Anastomotic polyps, unsure whether completely removed. RECOMMENDATIONS: Await path and discuss further with patient. If dysplasia or carcinoma in situ would proceed with surgery if not and patient desires would consider one more attempt endoscopically in approximately 6 weeks.  ______________________________ Dorena Cookey  CC:  n. eSIGNED:   Dorena Cookey at 12/15/2011 10:12 AM  Mitzie Na, [id number]

## 2011-12-17 DIAGNOSIS — H16229 Keratoconjunctivitis sicca, not specified as Sjogren's, unspecified eye: Secondary | ICD-10-CM | POA: Diagnosis not present

## 2011-12-19 ENCOUNTER — Encounter (HOSPITAL_COMMUNITY): Payer: Self-pay | Admitting: Gastroenterology

## 2012-01-16 DIAGNOSIS — D126 Benign neoplasm of colon, unspecified: Secondary | ICD-10-CM | POA: Diagnosis not present

## 2012-01-26 ENCOUNTER — Encounter (HOSPITAL_COMMUNITY): Payer: Self-pay | Admitting: Gastroenterology

## 2012-01-26 ENCOUNTER — Encounter (HOSPITAL_COMMUNITY)
Admission: RE | Disposition: A | Discharge status: Home / Self Care | Payer: Self-pay | Source: Ambulatory Visit | Attending: Gastroenterology

## 2012-01-26 ENCOUNTER — Ambulatory Visit (HOSPITAL_COMMUNITY)
Admission: RE | Admit: 2012-01-26 | Discharge: 2012-01-26 | Disposition: A | Discharge status: Home / Self Care | Payer: Medicare Other | Source: Ambulatory Visit | Attending: Gastroenterology | Admitting: Gastroenterology

## 2012-01-26 DIAGNOSIS — Z79899 Other long term (current) drug therapy: Secondary | ICD-10-CM | POA: Insufficient documentation

## 2012-01-26 DIAGNOSIS — D371 Neoplasm of uncertain behavior of stomach: Secondary | ICD-10-CM | POA: Insufficient documentation

## 2012-01-26 DIAGNOSIS — D126 Benign neoplasm of colon, unspecified: Secondary | ICD-10-CM | POA: Insufficient documentation

## 2012-01-26 DIAGNOSIS — E7800 Pure hypercholesterolemia, unspecified: Secondary | ICD-10-CM | POA: Insufficient documentation

## 2012-01-26 DIAGNOSIS — K5730 Diverticulosis of large intestine without perforation or abscess without bleeding: Secondary | ICD-10-CM | POA: Diagnosis not present

## 2012-01-26 DIAGNOSIS — Z86010 Personal history of colonic polyps: Secondary | ICD-10-CM

## 2012-01-26 DIAGNOSIS — I10 Essential (primary) hypertension: Secondary | ICD-10-CM | POA: Insufficient documentation

## 2012-01-26 DIAGNOSIS — D378 Neoplasm of uncertain behavior of other specified digestive organs: Secondary | ICD-10-CM | POA: Insufficient documentation

## 2012-01-26 HISTORY — PX: COLONOSCOPY: SHX5424

## 2012-01-26 HISTORY — DX: Polyp of colon: K63.5

## 2012-01-26 LAB — HM COLONOSCOPY

## 2012-01-26 SURGERY — COLONOSCOPY
Anesthesia: Moderate Sedation

## 2012-01-26 MED ORDER — MIDAZOLAM HCL 10 MG/2ML IJ SOLN
INTRAMUSCULAR | Status: AC
  Filled 2012-01-26: qty 2

## 2012-01-26 MED ORDER — SODIUM CHLORIDE 0.9 % IV SOLN: INTRAVENOUS | Status: DC

## 2012-01-26 MED ORDER — FENTANYL CITRATE 0.05 MG/ML IJ SOLN
INTRAMUSCULAR | Status: AC
  Filled 2012-01-26: qty 2

## 2012-01-26 MED ORDER — MIDAZOLAM HCL 5 MG/5ML IJ SOLN
INTRAMUSCULAR | Status: DC | PRN
  Administered 2012-01-26 (×2): 1 mg via INTRAVENOUS
  Administered 2012-01-26: 2 mg via INTRAVENOUS
  Administered 2012-01-26: 1 mg via INTRAVENOUS
  Administered 2012-01-26: 2 mg via INTRAVENOUS

## 2012-01-26 MED ORDER — SPOT INK MARKER SYRINGE KIT
PACK | SUBMUCOSAL | Status: DC | PRN
  Administered 2012-01-26: 2 mL via SUBMUCOSAL

## 2012-01-26 MED ORDER — FENTANYL CITRATE 0.05 MG/ML IJ SOLN
INTRAMUSCULAR | Status: DC | PRN
  Administered 2012-01-26: 10 ug via INTRAVENOUS
  Administered 2012-01-26 (×2): 25 ug via INTRAVENOUS

## 2012-01-26 NOTE — Op Note (Signed)
[facility] [address] [place], [address]   COLONOSCOPY PROCEDURE REPORT  PATIENT: Kienna, Moncada  MR#: [id number] BIRTHDATE: 01-19-1941 , 70  yrs. old GENDER: Female ENDOSCOPIST: Bernette Redbird, MD REFERRED BY:   Dr. Oneta Rack PROCEDURE DATE:  01/26/2012 PROCEDURE:    colonoscopy with biopsy and directed submucosal injection ASA CLASS:    2 INDICATIONS:  71 year old female,patient of Dr. Dorena Cookey, 14 years status post right small colectomy for large villous adenoma, now with villous polyp near the anastomosis noted in April, partially removed at that time, and with further extirpation of tissue 6 weeks ago MEDICATIONS:    fentanyl 60 mcg, Versed 7 mg IV  DESCRIPTION OF PROCEDURE: the patient came as an outpatient to the [facility] long endoscopy unit and provided written consent. The Pentax adult video colonoscope was advanced without significant difficulty around the colon to her ileocolonic anastomosis, and for a short distance into a normal-appearing terminal ileum. The quality of the prep was excellent and it is felt that all areas were well seen.  On the way in, I encountered a prominent fold in the transverse colon, which appeared to be a small infiltrating colonic mass. Multiple biopsies were obtained of it, and a couple of tattoos were placed on either side of it, and a clip was placed for subsequent radiographic localization. Overall, this appears to be a benign lesion, but it had rather indistinct margins, was several centimeters across in greatest dimension, and did not appear to be the kind of lesion that would be readily and definitively removable by standard colonoscopic methodology, although if benign, it could be considered for possible endoscopic mucosal resection.  At the anastomosis, there was a fair amount of residual spreading verrucous polyp tissue, which I biopsied. Based on conversation with Dr. Madilyn Fireman, where the exam 6 weeks ago  had apparently achieved removal of all visible polyp tissue, it was my opinion that further attempts at colonoscopic excision of this lesion would not likely be effective.  There were a couple of small, hyperplastic-appearing or lymphoid-appearing lesions in the proximal colon which I elected not to biopsy.  There was mild to moderate sigmoid diverticulosis.  Retroflexion in the rectum and reinspection of the rectum were unremarkable. The patient tolerated the procedure well     COMPLICATIONS: None  ENDOSCOPIC IMPRESSION:  1. Persistence/recurrence of polyp tissue at the ileocolonic anastomosis 2. Newly identified lesion in the transverse colon, probably neoplastic, likely benign 3. Sigmoid diverticulosis 4. Status post proximal colectomy with ileocolonic anastomosis in 1999 for villous adenoma  RECOMMENDATIONS:  1. Await pathology results 2. It appears that this patient will probably require surgery for definitive removal of her colonic lesions, or possibly, peripheral [facility] for endoscopic mucosal resection, depending on the pathology results. 3. In the meantime, I will obtain a KUB to localize the transverse colonic lesion    _______________________________ eSigned:  Bernette Redbird, MD 01/26/2012 10:37 AM     PATIENT NAME:  Taylen, Wendland MR#: [id number]

## 2012-01-26 NOTE — H&P (Signed)
  This is a 71 year old female who presents today for repeat colonoscopic treatment of a villous adenomatous polyp in the proximal colon.  The patient has a strong family history of colorectal neoplasia, and she herself had resection of the proximal colon for a large benign villous adenoma in 1999.  On surveillance colonoscopy by Dr. Dorena Cookey in April of this year, there was noted to be villous polyp tissue at her anastomosis. This was treated with snare technique, but on repeat examination in July of this year, approximately 6 weeks ago, it was noted that there was still quite a bit of polyp tissue remaining. He was treated with a combination of snare technique and argon plasma coagulation therapy, to the point where all visible tissue was removed or extirpated.  She presents today to assess the adequacy of her last procedure, and to help determine whether colonoscopic management of this problem is realistic, or whether she will have to have another operation.  Past medical history:  Allergies: Codeine (nausea)  Outpatient medications: Pravastatin, ranitidine, fenofibrate, paroxetine, atenolol, and vitamin D supplement. The patient has also used low-dose aspirin in the past, but has not used it for approximately the past 4 weeks.  Operations: Proximal colon resection 1999, also back surgery, knee surgery, eye surgery  Chronic medical illnesses: Hypertension, hypercholesterolemia  Physical exam: A pleasant, somewhat overweight, healthy-appearing female who does not appear anxious, depressed, or in any distress. Chest clear to auscultation. Heart normal, with regular rhythm and no murmurs. Abdomen somewhat adipose, but without organomegaly, guarding, mass effect, or tenderness.  Impression: Persistent/recurring dysplastic tissue in the proximal colon  Plan: Proceed to colonoscopic evaluation today, with the intent of assessing the degree of residual tissue (if any), and if only a small amount  is present, attempting to remove it in its entirety.  Ria Clock, M.D.   [phone]

## 2012-01-27 ENCOUNTER — Encounter (HOSPITAL_COMMUNITY): Payer: Self-pay

## 2012-01-30 ENCOUNTER — Telehealth (INDEPENDENT_AMBULATORY_CARE_PROVIDER_SITE_OTHER): Payer: Self-pay | Admitting: Surgery

## 2012-01-30 ENCOUNTER — Encounter (HOSPITAL_COMMUNITY): Payer: Self-pay | Admitting: Gastroenterology

## 2012-01-30 NOTE — Telephone Encounter (Signed)
Kristen [name] had an ileal resection by Precious Reel around '99.  She suffers from recurrent cauliflower polyps and has two lesions to address.  One at anastomosis and another in prox transverse colon that has been tatooed.  She will need extended right hemicolectomy.  I am scheduled to see her this Friday.

## 2012-02-03 ENCOUNTER — Ambulatory Visit (INDEPENDENT_AMBULATORY_CARE_PROVIDER_SITE_OTHER): Payer: Medicare Other | Admitting: Surgery

## 2012-02-03 ENCOUNTER — Encounter (INDEPENDENT_AMBULATORY_CARE_PROVIDER_SITE_OTHER): Payer: Self-pay | Admitting: Surgery

## 2012-02-03 VITALS — BP 128/78 | HR 64 | Temp 97.6°F | Resp 20 | Ht 66.0 in | Wt 172.0 lb

## 2012-02-03 DIAGNOSIS — I10 Essential (primary) hypertension: Secondary | ICD-10-CM

## 2012-02-03 DIAGNOSIS — D369 Benign neoplasm, unspecified site: Secondary | ICD-10-CM

## 2012-02-03 DIAGNOSIS — E7800 Pure hypercholesterolemia, unspecified: Secondary | ICD-10-CM

## 2012-02-03 NOTE — Progress Notes (Signed)
Chief Complaint:  Recurrent adenomatous polyps of the right colon  History of Present Illness:  Kristen [name] is an 71 y.o. 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 Mardella Layman presents for repeat partial colectomy for same.  She underwent a colonoscopy by Dr. Madilyn Fireman who found a recurrent adenomatous polyp at her anastomosis and another in the proximal transverse colon which he biopsied and tattooed. She has been through this procedure before and we discussed laparoscopically-assisted partial colectomy. She wants to move forward with resection Nissen as possible. We will do an oral bowel prep and do her surgery at [facility].  Past Medical History  Diagnosis Date  . Hypertension   . Shortness of breath     exertion  . Pneumonia     hx of pneumonia  . GERD (gastroesophageal reflux disease)   . Headache     hx migraines  . Depression   . Colon polyps     Past Surgical History  Procedure Date  . Colon resection 1988  . Colonoscopy 09/20/2011    Procedure: COLONOSCOPY;  Surgeon: Barrie Folk, MD;  Location: [facility];  Service: Endoscopy;  Laterality: N/A;  . Hot hemostasis 09/20/2011    Procedure: HOT HEMOSTASIS (ARGON PLASMA COAGULATION/BICAP);  Surgeon: Barrie Folk, MD;  Location: [facility];  Service: Endoscopy;  Laterality: N/A;  . Cataract extraction June/july 2013    bilateral eyes  . Colonoscopy 12/15/2011    Procedure: COLONOSCOPY;  Surgeon: Barrie Folk, MD;  Location: [facility] ENDOSCOPY;  Service: Endoscopy;  Laterality: N/A;  . Colonoscopy 01/26/2012    Procedure: COLONOSCOPY;  Surgeon: Florencia Reasons, MD;  Location: [facility];  Service: Endoscopy;  Laterality: N/A;    Current Outpatient Prescriptions  Medication Sig Dispense Refill  . atenolol (TENORMIN) 100 MG tablet Take 100 mg by mouth daily.      . cholecalciferol (VITAMIN D)  1000 UNITS tablet Take 1,000 Units by mouth daily.      . fenofibrate micronized (LOFIBRA) 134 MG capsule Take 134 mg by mouth daily before breakfast.      . PARoxetine (PAXIL) 40 MG tablet Take 40 mg by mouth every morning.      . pravastatin (PRAVACHOL) 40 MG tablet Take 40 mg by mouth daily.      . ranitidine (ZANTAC) 300 MG capsule Take 300 mg by mouth every evening.      Marland Kitchen aspirin 81 MG tablet Take 1 tablet (81 mg total) by mouth 2 (two) times daily.  30 tablet  1   Codeine Family History  Problem Relation Age of Onset  . Colon cancer Brother   . Colon cancer Sister   . Heart disease Mother   . Heart disease Father    Social History:   reports that she has never smoked. She does not have any smokeless tobacco history on file. She reports that she drinks alcohol. She reports that she does not use illicit drugs.   REVIEW OF SYSTEMS - PERTINENT POSITIVES ONLY: No history of DVT  Physical Exam:   Blood pressure 128/78, pulse 64, temperature 97.6 F (36.4 C), temperature source Temporal, resp. rate 20, height 5\' 6"  (1.676 m), weight 172 lb (78.019 kg). Body mass index is 27.76 kg/(m^2).  Gen:  WDWN WF NAD  Neurological: Alert and oriented to person, place, and time. Motor and sensory function  is grossly intact  Head: Normocephalic and atraumatic.  Eyes: Conjunctivae are normal. Pupils are equal, round, and reactive to light. No scleral icterus.  Neck: Normal range of motion. Neck supple. No tracheal deviation or thyromegaly present.  Cardiovascular:  SR without murmurs or gallops.  No carotid bruits Respiratory: Effort normal.  No respiratory distress. No chest wall tenderness. Breath sounds normal.  No wheezes, rales or rhonchi.  Abdomen:  Transverse incision in the right mid abdomen GU: Musculoskeletal: Normal range of motion. Extremities are nontender. No cyanosis, edema or clubbing noted Lymphadenopathy: No cervical, preauricular, postauricular or axillary adenopathy is  present Skin: Skin is warm and dry. No rash noted. No diaphoresis. No erythema. No pallor. Pscyh: Normal mood and affect. Behavior is normal. Judgment and thought content normal.   LABORATORY RESULTS: No results found for this or any previous visit (from the past 48 hour(s)).  RADIOLOGY RESULTS: No results found.  Problem List: Patient Active Problem List  Diagnosis  . Hypertension  . Hypercholesterolemia  . Adenomatous polyps-recurrent    Assessment & Plan: Recurrent polyps of the right colon that are not amenable to colonoscopy resection.  Proceed with lap assisted partial colectomy.  Patient aware of risks.     Matt B. Daphine Deutscher, MD, [facility] [phone] beeper [phone]  02/03/2012 1:58 PM

## 2012-02-28 ENCOUNTER — Encounter (INDEPENDENT_AMBULATORY_CARE_PROVIDER_SITE_OTHER): Payer: Self-pay

## 2012-03-15 ENCOUNTER — Encounter (HOSPITAL_COMMUNITY): Payer: Self-pay | Admitting: Pharmacy Technician

## 2012-03-21 ENCOUNTER — Inpatient Hospital Stay (HOSPITAL_COMMUNITY): Admission: RE | Admit: 2012-03-21 | Payer: Medicare Other | Source: Ambulatory Visit

## 2012-03-23 ENCOUNTER — Encounter (HOSPITAL_COMMUNITY): Admission: RE | Payer: Self-pay | Source: Ambulatory Visit

## 2012-03-23 ENCOUNTER — Ambulatory Visit (HOSPITAL_COMMUNITY): Admission: RE | Admit: 2012-03-23 | Payer: Medicare Other | Source: Ambulatory Visit | Admitting: Surgery

## 2012-03-23 SURGERY — LAPAROSCOPIC RIGHT HEMI COLECTOMY
Anesthesia: General | Laterality: Right

## 2012-03-28 DIAGNOSIS — H26499 Other secondary cataract, unspecified eye: Secondary | ICD-10-CM | POA: Diagnosis not present

## 2012-03-28 DIAGNOSIS — H35379 Puckering of macula, unspecified eye: Secondary | ICD-10-CM | POA: Diagnosis not present

## 2012-03-29 DIAGNOSIS — H43399 Other vitreous opacities, unspecified eye: Secondary | ICD-10-CM | POA: Diagnosis not present

## 2012-03-29 DIAGNOSIS — H5347 Heteronymous bilateral field defects: Secondary | ICD-10-CM | POA: Diagnosis not present

## 2012-03-29 DIAGNOSIS — H43819 Vitreous degeneration, unspecified eye: Secondary | ICD-10-CM | POA: Diagnosis not present

## 2012-03-30 DIAGNOSIS — D497 Neoplasm of unspecified behavior of endocrine glands and other parts of nervous system: Secondary | ICD-10-CM | POA: Diagnosis not present

## 2012-03-30 DIAGNOSIS — R220 Localized swelling, mass and lump, head: Secondary | ICD-10-CM | POA: Diagnosis not present

## 2012-03-30 DIAGNOSIS — D353 Benign neoplasm of craniopharyngeal duct: Secondary | ICD-10-CM | POA: Diagnosis not present

## 2012-03-30 DIAGNOSIS — R0989 Other specified symptoms and signs involving the circulatory and respiratory systems: Secondary | ICD-10-CM | POA: Diagnosis not present

## 2012-03-30 DIAGNOSIS — R221 Localized swelling, mass and lump, neck: Secondary | ICD-10-CM | POA: Diagnosis not present

## 2012-04-06 ENCOUNTER — Other Ambulatory Visit: Payer: Self-pay | Admitting: Neurosurgery

## 2012-04-06 ENCOUNTER — Encounter (INDEPENDENT_AMBULATORY_CARE_PROVIDER_SITE_OTHER): Payer: Medicare Other | Admitting: Surgery

## 2012-04-06 DIAGNOSIS — R0989 Other specified symptoms and signs involving the circulatory and respiratory systems: Secondary | ICD-10-CM

## 2012-04-06 DIAGNOSIS — E236 Other disorders of pituitary gland: Secondary | ICD-10-CM

## 2012-04-06 DIAGNOSIS — I499 Cardiac arrhythmia, unspecified: Secondary | ICD-10-CM

## 2012-04-06 DIAGNOSIS — H5347 Heteronymous bilateral field defects: Secondary | ICD-10-CM | POA: Diagnosis not present

## 2012-04-06 HISTORY — DX: Other disorders of pituitary gland: E23.6

## 2012-04-06 HISTORY — DX: Cardiac arrhythmia, unspecified: I49.9

## 2012-04-10 DIAGNOSIS — H43399 Other vitreous opacities, unspecified eye: Secondary | ICD-10-CM | POA: Diagnosis not present

## 2012-04-10 DIAGNOSIS — H43819 Vitreous degeneration, unspecified eye: Secondary | ICD-10-CM | POA: Diagnosis not present

## 2012-04-10 DIAGNOSIS — H5347 Heteronymous bilateral field defects: Secondary | ICD-10-CM | POA: Diagnosis not present

## 2012-04-11 ENCOUNTER — Ambulatory Visit
Admission: RE | Admit: 2012-04-11 | Discharge: 2012-04-11 | Disposition: A | Discharge status: Home / Self Care | Payer: Medicare Other | Source: Ambulatory Visit | Attending: Neurosurgery | Admitting: Neurosurgery

## 2012-04-11 DIAGNOSIS — I658 Occlusion and stenosis of other precerebral arteries: Secondary | ICD-10-CM | POA: Diagnosis not present

## 2012-04-11 DIAGNOSIS — R0989 Other specified symptoms and signs involving the circulatory and respiratory systems: Secondary | ICD-10-CM

## 2012-04-16 ENCOUNTER — Other Ambulatory Visit: Payer: Self-pay | Admitting: Neurosurgery

## 2012-04-17 ENCOUNTER — Encounter (HOSPITAL_COMMUNITY): Payer: Self-pay | Admitting: Pharmacist

## 2012-04-17 ENCOUNTER — Encounter (HOSPITAL_COMMUNITY): Payer: Self-pay | Admitting: *Deleted

## 2012-04-17 DIAGNOSIS — D352 Benign neoplasm of pituitary gland: Secondary | ICD-10-CM | POA: Diagnosis not present

## 2012-04-17 DIAGNOSIS — D353 Benign neoplasm of craniopharyngeal duct: Secondary | ICD-10-CM | POA: Diagnosis not present

## 2012-04-17 MED ORDER — DEXTROSE 5 % IV SOLN
2.0000 g | INTRAVENOUS | Status: DC
  Filled 2012-04-17: qty 2

## 2012-04-17 MED ORDER — CEFAZOLIN SODIUM-DEXTROSE 2-3 GM-% IV SOLR
2.0000 g | INTRAVENOUS | Status: AC
  Administered 2012-04-18: 2 g via INTRAVENOUS
  Filled 2012-04-17 (×2): qty 50

## 2012-04-17 MED ORDER — CHLORHEXIDINE GLUCONATE 4 % EX LIQD: 1.0000 "application " | Freq: Once | CUTANEOUS | Status: DC

## 2012-04-17 MED ORDER — HEPARIN SODIUM (PORCINE) 5000 UNIT/ML IJ SOLN: 5000.0000 [IU] | Freq: Once | INTRAMUSCULAR | Status: DC

## 2012-04-17 NOTE — Progress Notes (Signed)
I called Dr Lovell Sheehan office and asked to have Dr Lovell Sheehan to sign orders.

## 2012-04-18 ENCOUNTER — Ambulatory Visit (HOSPITAL_COMMUNITY): Payer: Medicare Other

## 2012-04-18 ENCOUNTER — Inpatient Hospital Stay (HOSPITAL_COMMUNITY)
Admission: RE | Admit: 2012-04-18 | Discharge: 2012-04-21 | DRG: 621 | Disposition: A | Discharge status: Home / Self Care | Payer: Medicare Other | Source: Ambulatory Visit | Attending: Neurosurgery | Admitting: Neurosurgery

## 2012-04-18 ENCOUNTER — Encounter (HOSPITAL_COMMUNITY): Payer: Self-pay | Admitting: Anesthesiology

## 2012-04-18 ENCOUNTER — Ambulatory Visit (HOSPITAL_COMMUNITY): Payer: Medicare Other | Admitting: Anesthesiology

## 2012-04-18 ENCOUNTER — Encounter (HOSPITAL_COMMUNITY)
Admission: RE | Disposition: A | Discharge status: Home / Self Care | Payer: Self-pay | Source: Ambulatory Visit | Attending: Neurosurgery

## 2012-04-18 ENCOUNTER — Encounter (HOSPITAL_COMMUNITY): Payer: Self-pay | Admitting: *Deleted

## 2012-04-18 ENCOUNTER — Inpatient Hospital Stay (HOSPITAL_COMMUNITY): Payer: Medicare Other

## 2012-04-18 DIAGNOSIS — F3289 Other specified depressive episodes: Secondary | ICD-10-CM | POA: Diagnosis present

## 2012-04-18 DIAGNOSIS — H53469 Homonymous bilateral field defects, unspecified side: Secondary | ICD-10-CM | POA: Diagnosis present

## 2012-04-18 DIAGNOSIS — E7800 Pure hypercholesterolemia, unspecified: Secondary | ICD-10-CM

## 2012-04-18 DIAGNOSIS — Z23 Encounter for immunization: Secondary | ICD-10-CM

## 2012-04-18 DIAGNOSIS — R000 Tachycardia, unspecified: Secondary | ICD-10-CM | POA: Diagnosis not present

## 2012-04-18 DIAGNOSIS — D352 Benign neoplasm of pituitary gland: Secondary | ICD-10-CM | POA: Diagnosis not present

## 2012-04-18 DIAGNOSIS — I10 Essential (primary) hypertension: Secondary | ICD-10-CM | POA: Diagnosis present

## 2012-04-18 DIAGNOSIS — F329 Major depressive disorder, single episode, unspecified: Secondary | ICD-10-CM | POA: Diagnosis not present

## 2012-04-18 DIAGNOSIS — I471 Supraventricular tachycardia: Secondary | ICD-10-CM | POA: Diagnosis not present

## 2012-04-18 DIAGNOSIS — M129 Arthropathy, unspecified: Secondary | ICD-10-CM | POA: Diagnosis present

## 2012-04-18 DIAGNOSIS — K219 Gastro-esophageal reflux disease without esophagitis: Secondary | ICD-10-CM | POA: Diagnosis present

## 2012-04-18 DIAGNOSIS — I498 Other specified cardiac arrhythmias: Secondary | ICD-10-CM | POA: Diagnosis not present

## 2012-04-18 DIAGNOSIS — D353 Benign neoplasm of craniopharyngeal duct: Principal | ICD-10-CM | POA: Diagnosis present

## 2012-04-18 DIAGNOSIS — Z452 Encounter for adjustment and management of vascular access device: Secondary | ICD-10-CM | POA: Diagnosis not present

## 2012-04-18 DIAGNOSIS — I517 Cardiomegaly: Secondary | ICD-10-CM | POA: Diagnosis not present

## 2012-04-18 DIAGNOSIS — Z79899 Other long term (current) drug therapy: Secondary | ICD-10-CM

## 2012-04-18 DIAGNOSIS — J9819 Other pulmonary collapse: Secondary | ICD-10-CM | POA: Diagnosis not present

## 2012-04-18 DIAGNOSIS — R930 Abnormal findings on diagnostic imaging of skull and head, not elsewhere classified: Secondary | ICD-10-CM | POA: Diagnosis not present

## 2012-04-18 DIAGNOSIS — R072 Precordial pain: Secondary | ICD-10-CM | POA: Diagnosis not present

## 2012-04-18 HISTORY — PX: TRANSNASAL APPROACH: SHX6149

## 2012-04-18 HISTORY — DX: Unspecified osteoarthritis, unspecified site: M19.90

## 2012-04-18 HISTORY — PX: CRANIOTOMY: SHX93

## 2012-04-18 HISTORY — DX: Other specified postprocedural states: Z98.890

## 2012-04-18 HISTORY — DX: Other specified postprocedural states: R11.2

## 2012-04-18 LAB — BASIC METABOLIC PANEL
Calcium: 9.3 mg/dL (ref 8.4–10.5)
GFR calc Af Amer: >90 mL/min (ref >90)
GFR calc non Af Amer: 89 mL/min — ABNORMAL LOW (ref >90)
Potassium: 3.5 mEq/L (ref 3.5–5.1)
Sodium: 136 mEq/L (ref 135–145)

## 2012-04-18 LAB — CBC
MCH: 28.9 pg (ref 26.0–34.0)
Platelets: 224 10*3/uL (ref 150–400)
RBC: 4.33 MIL/uL (ref 3.87–5.11)
WBC: 4.6 10*3/uL (ref 4.0–10.5)

## 2012-04-18 LAB — SURGICAL PCR SCREEN
MRSA, PCR: NEGATIVE
Staphylococcus aureus: NEGATIVE

## 2012-04-18 LAB — TYPE AND SCREEN
ABO/RH(D): O POS
Antibody Screen: NEGATIVE

## 2012-04-18 SURGERY — CRANIOTOMY HYPOPHYSECTOMY TRANSNASAL APPROACH
Anesthesia: General | Site: Nose | Laterality: Bilateral | Wound class: Clean Contaminated

## 2012-04-18 MED ORDER — SODIUM CHLORIDE 0.9 % IV SOLN
INTRAVENOUS | Status: DC | PRN
  Administered 2012-04-18: 13:00:00 via INTRAVENOUS

## 2012-04-18 MED ORDER — MORPHINE SULFATE 2 MG/ML IJ SOLN
1.0000 mg | INTRAMUSCULAR | Status: DC | PRN
  Administered 2012-04-19: 2 mg via INTRAVENOUS
  Filled 2012-04-18: qty 1

## 2012-04-18 MED ORDER — SODIUM CHLORIDE 0.9 % IV SOLN
INTRAVENOUS | Status: AC
  Filled 2012-04-18: qty 500

## 2012-04-18 MED ORDER — DOCUSATE SODIUM 100 MG PO CAPS
100.0000 mg | ORAL_CAPSULE | Freq: Two times a day (BID) | ORAL | Status: DC
  Administered 2012-04-18 – 2012-04-21 (×6): 100 mg via ORAL
  Filled 2012-04-18 (×5): qty 1

## 2012-04-18 MED ORDER — INFLUENZA VIRUS VACC SPLIT PF IM SUSP
0.5000 mL | INTRAMUSCULAR | Status: AC
  Administered 2012-04-19: 0.5 mL via INTRAMUSCULAR
  Filled 2012-04-18: qty 0.5

## 2012-04-18 MED ORDER — BACITRACIN 50000 UNITS IM SOLR
INTRAMUSCULAR | Status: AC
  Filled 2012-04-18: qty 1

## 2012-04-18 MED ORDER — DEXAMETHASONE SODIUM PHOSPHATE 10 MG/ML IJ SOLN
6.0000 mg | Freq: Four times a day (QID) | INTRAMUSCULAR | Status: DC
  Administered 2012-04-18 – 2012-04-19 (×3): 6 mg via INTRAVENOUS
  Filled 2012-04-18 (×7): qty 0.6

## 2012-04-18 MED ORDER — PANTOPRAZOLE SODIUM 40 MG IV SOLR
40.0000 mg | Freq: Every day | INTRAVENOUS | Status: DC
  Administered 2012-04-18: 40 mg via INTRAVENOUS
  Filled 2012-04-18 (×2): qty 40

## 2012-04-18 MED ORDER — FAMOTIDINE 20 MG PO TABS
20.0000 mg | ORAL_TABLET | Freq: Two times a day (BID) | ORAL | Status: DC
  Administered 2012-04-18 – 2012-04-21 (×6): 20 mg via ORAL
  Filled 2012-04-18 (×7): qty 1

## 2012-04-18 MED ORDER — ACETAMINOPHEN 325 MG PO TABS: 650.0000 mg | ORAL_TABLET | ORAL | Status: DC | PRN

## 2012-04-18 MED ORDER — LACTATED RINGERS IV SOLN
INTRAVENOUS | Status: DC | PRN
  Administered 2012-04-18 (×2): via INTRAVENOUS

## 2012-04-18 MED ORDER — PROPOFOL 10 MG/ML IV BOLUS
INTRAVENOUS | Status: DC | PRN
  Administered 2012-04-18: 200 mg via INTRAVENOUS

## 2012-04-18 MED ORDER — THROMBIN 5000 UNITS EX KIT
PACK | CUTANEOUS | Status: DC | PRN
  Administered 2012-04-18 (×2): 5000 [IU] via TOPICAL

## 2012-04-18 MED ORDER — 0.9 % SODIUM CHLORIDE (POUR BTL) OPTIME
TOPICAL | Status: DC | PRN
  Administered 2012-04-18: 1000 mL

## 2012-04-18 MED ORDER — ROCURONIUM BROMIDE 100 MG/10ML IV SOLN
INTRAVENOUS | Status: DC | PRN
  Administered 2012-04-18: 10 mg via INTRAVENOUS
  Administered 2012-04-18: 50 mg via INTRAVENOUS

## 2012-04-18 MED ORDER — PNEUMOCOCCAL VAC POLYVALENT 25 MCG/0.5ML IJ INJ
0.5000 mL | INJECTION | INTRAMUSCULAR | Status: AC
  Administered 2012-04-19: 0.5 mL via INTRAMUSCULAR
  Filled 2012-04-18 (×2): qty 0.5

## 2012-04-18 MED ORDER — HYDROCORTISONE SOD SUCCINATE 100 MG IJ SOLR
100.0000 mg | Freq: Four times a day (QID) | INTRAMUSCULAR | Status: AC
  Administered 2012-04-18 – 2012-04-19 (×4): 100 mg via INTRAVENOUS
  Filled 2012-04-18 (×5): qty 2

## 2012-04-18 MED ORDER — ONDANSETRON HCL 4 MG PO TABS: 4.0000 mg | ORAL_TABLET | ORAL | Status: DC | PRN

## 2012-04-18 MED ORDER — NEOSTIGMINE METHYLSULFATE 1 MG/ML IJ SOLN
INTRAMUSCULAR | Status: DC | PRN
  Administered 2012-04-18: 4 mg via INTRAVENOUS

## 2012-04-18 MED ORDER — DEXAMETHASONE SODIUM PHOSPHATE 4 MG/ML IJ SOLN: 4.0000 mg | Freq: Four times a day (QID) | INTRAMUSCULAR | Status: DC

## 2012-04-18 MED ORDER — FENTANYL CITRATE 0.05 MG/ML IJ SOLN
INTRAMUSCULAR | Status: DC | PRN
  Administered 2012-04-18: 50 ug via INTRAVENOUS
  Administered 2012-04-18 (×2): 25 ug via INTRAVENOUS
  Administered 2012-04-18: 100 ug via INTRAVENOUS

## 2012-04-18 MED ORDER — HYDROMORPHONE HCL PF 1 MG/ML IJ SOLN: 0.2500 mg | INTRAMUSCULAR | Status: DC | PRN

## 2012-04-18 MED ORDER — EPHEDRINE SULFATE 50 MG/ML IJ SOLN
INTRAMUSCULAR | Status: DC | PRN
  Administered 2012-04-18 (×2): 5 mg via INTRAVENOUS
  Administered 2012-04-18: 10 mg via INTRAVENOUS

## 2012-04-18 MED ORDER — BACITRACIN 50000 UNITS IM SOLR
INTRAMUSCULAR | Status: DC | PRN
  Administered 2012-04-18: 15:00:00

## 2012-04-18 MED ORDER — ONDANSETRON HCL 4 MG/2ML IJ SOLN
INTRAMUSCULAR | Status: AC
  Filled 2012-04-18: qty 2

## 2012-04-18 MED ORDER — ONDANSETRON HCL 4 MG/2ML IJ SOLN
INTRAMUSCULAR | Status: DC | PRN
  Administered 2012-04-18: 4 mg via INTRAVENOUS

## 2012-04-18 MED ORDER — MUPIROCIN 2 % EX OINT
TOPICAL_OINTMENT | Freq: Two times a day (BID) | CUTANEOUS | Status: DC
  Administered 2012-04-20 – 2012-04-21 (×2): via NASAL
  Filled 2012-04-18: qty 22

## 2012-04-18 MED ORDER — SODIUM CHLORIDE 0.9 % IV SOLN
500.0000 mg | Freq: Two times a day (BID) | INTRAVENOUS | Status: DC
  Administered 2012-04-18 – 2012-04-21 (×6): 500 mg via INTRAVENOUS
  Filled 2012-04-18 (×7): qty 5

## 2012-04-18 MED ORDER — BACITRACIN ZINC 500 UNIT/GM EX OINT
TOPICAL_OINTMENT | CUTANEOUS | Status: DC | PRN
  Administered 2012-04-18: 1 via TOPICAL

## 2012-04-18 MED ORDER — ONDANSETRON HCL 4 MG/2ML IJ SOLN
4.0000 mg | Freq: Once | INTRAMUSCULAR | Status: AC | PRN
  Administered 2012-04-18: 4 mg via INTRAVENOUS

## 2012-04-18 MED ORDER — ATORVASTATIN CALCIUM 20 MG PO TABS
20.0000 mg | ORAL_TABLET | Freq: Every day | ORAL | Status: DC
  Administered 2012-04-19 – 2012-04-20 (×2): 20 mg via ORAL
  Filled 2012-04-18 (×3): qty 1

## 2012-04-18 MED ORDER — LABETALOL HCL 5 MG/ML IV SOLN
10.0000 mg | INTRAVENOUS | Status: DC | PRN
  Administered 2012-04-19: 10 mg via INTRAVENOUS
  Filled 2012-04-18: qty 4

## 2012-04-18 MED ORDER — HYDROMORPHONE HCL 2 MG PO TABS: 2.0000 mg | ORAL_TABLET | ORAL | Status: DC | PRN

## 2012-04-18 MED ORDER — OXYMETAZOLINE HCL 0.05 % NA SOLN
NASAL | Status: DC | PRN
  Administered 2012-04-18: 1 via NASAL

## 2012-04-18 MED ORDER — GLYCOPYRROLATE 0.2 MG/ML IJ SOLN
INTRAMUSCULAR | Status: DC | PRN
  Administered 2012-04-18: 0.6 mg via INTRAVENOUS

## 2012-04-18 MED ORDER — FUROSEMIDE 40 MG PO TABS
40.0000 mg | ORAL_TABLET | Freq: Every morning | ORAL | Status: DC
  Administered 2012-04-19 – 2012-04-21 (×3): 40 mg via ORAL
  Filled 2012-04-18 (×3): qty 1

## 2012-04-18 MED ORDER — PAROXETINE HCL 20 MG PO TABS: 20.0000 mg | ORAL_TABLET | Freq: Every day | ORAL | Status: DC

## 2012-04-18 MED ORDER — MUPIROCIN 2 % EX OINT
TOPICAL_OINTMENT | CUTANEOUS | Status: AC
  Administered 2012-04-18: 1
  Filled 2012-04-18: qty 22

## 2012-04-18 MED ORDER — DEXAMETHASONE SODIUM PHOSPHATE 4 MG/ML IJ SOLN: 4.0000 mg | Freq: Three times a day (TID) | INTRAMUSCULAR | Status: DC

## 2012-04-18 MED ORDER — DEXAMETHASONE SODIUM PHOSPHATE 4 MG/ML IJ SOLN
INTRAMUSCULAR | Status: DC | PRN
  Administered 2012-04-18: 8 mg via INTRAVENOUS

## 2012-04-18 MED ORDER — HYDROMORPHONE HCL PF 1 MG/ML IJ SOLN
INTRAMUSCULAR | Status: DC | PRN
  Administered 2012-04-18: .25 mg via INTRAVENOUS

## 2012-04-18 MED ORDER — LACTATED RINGERS IV SOLN
INTRAVENOUS | Status: DC
  Administered 2012-04-18: 1000 mL via INTRAVENOUS
  Administered 2012-04-19: 14:00:00 via INTRAVENOUS
  Administered 2012-04-20: 75 mL/h via INTRAVENOUS

## 2012-04-18 MED ORDER — CEFAZOLIN SODIUM-DEXTROSE 2-3 GM-% IV SOLR: 2.0000 g | INTRAVENOUS | Status: DC

## 2012-04-18 MED ORDER — LABETALOL HCL 5 MG/ML IV SOLN
10.0000 mg | INTRAVENOUS | Status: DC | PRN
  Administered 2012-04-18 (×2): 10 mg via INTRAVENOUS

## 2012-04-18 MED ORDER — ARTIFICIAL TEARS OP OINT
TOPICAL_OINTMENT | OPHTHALMIC | Status: DC | PRN
  Administered 2012-04-18: 1 via OPHTHALMIC

## 2012-04-18 MED ORDER — DEXTROSE 5 % IV SOLN
INTRAVENOUS | Status: DC | PRN
  Administered 2012-04-18: 14:00:00 via INTRAVENOUS

## 2012-04-18 MED ORDER — CEFAZOLIN SODIUM-DEXTROSE 2-3 GM-% IV SOLR
2.0000 g | Freq: Three times a day (TID) | INTRAVENOUS | Status: DC
  Administered 2012-04-18 – 2012-04-21 (×8): 2 g via INTRAVENOUS
  Filled 2012-04-18 (×10): qty 50

## 2012-04-18 MED ORDER — HEMOSTATIC AGENTS (NO CHARGE) OPTIME
TOPICAL | Status: DC | PRN
  Administered 2012-04-18: 1 via TOPICAL

## 2012-04-18 MED ORDER — LIDOCAINE-EPINEPHRINE 1 %-1:100000 IJ SOLN
INTRAMUSCULAR | Status: DC | PRN
  Administered 2012-04-18: 10 mL

## 2012-04-18 MED ORDER — ACETAMINOPHEN 650 MG RE SUPP: 650.0000 mg | RECTAL | Status: DC | PRN

## 2012-04-18 MED ORDER — PROMETHAZINE HCL 25 MG PO TABS: 12.5000 mg | ORAL_TABLET | ORAL | Status: DC | PRN

## 2012-04-18 MED ORDER — LIDOCAINE HCL (CARDIAC) 20 MG/ML IV SOLN
INTRAVENOUS | Status: DC | PRN
  Administered 2012-04-18: 80 mg via INTRAVENOUS

## 2012-04-18 MED ORDER — FENOFIBRATE 160 MG PO TABS
160.0000 mg | ORAL_TABLET | Freq: Every day | ORAL | Status: DC
  Administered 2012-04-19 – 2012-04-21 (×3): 160 mg via ORAL
  Filled 2012-04-18 (×3): qty 1

## 2012-04-18 MED ORDER — PAROXETINE HCL 20 MG PO TABS
20.0000 mg | ORAL_TABLET | Freq: Every day | ORAL | Status: DC
  Administered 2012-04-18 – 2012-04-20 (×3): 20 mg via ORAL
  Filled 2012-04-18 (×4): qty 1

## 2012-04-18 MED ORDER — ONDANSETRON HCL 4 MG/2ML IJ SOLN: 4.0000 mg | INTRAMUSCULAR | Status: DC | PRN

## 2012-04-18 MED ORDER — LABETALOL HCL 5 MG/ML IV SOLN
INTRAVENOUS | Status: AC
  Administered 2012-04-18: 10 mg via INTRAVENOUS
  Filled 2012-04-18: qty 4

## 2012-04-18 SURGICAL SUPPLY — 120 items
BENZOIN TINCTURE PRP APPL 2/3 (GAUZE/BANDAGES/DRESSINGS) ×2 IMPLANT
BLADE EYE SICKLE 84 5 BEAV (BLADE) IMPLANT
BLADE SURG 10 STRL SS (BLADE) ×2 IMPLANT
BLADE SURG 11 STRL SS (BLADE) IMPLANT
BLADE SURG 15 STRL LF DISP TIS (BLADE) IMPLANT
BLADE SURG 15 STRL SS (BLADE)
BUR MATCHSTICK NEURO 3.0 LAGG (BURR) IMPLANT
CANISTER SUCTION 2500CC (MISCELLANEOUS) ×2 IMPLANT
CATH ROBINSON RED A/P 14FR (CATHETERS) IMPLANT
CLEANER TIP ELECTROSURG 2X2 (MISCELLANEOUS) IMPLANT
CLOTH BEACON ORANGE TIMEOUT ST (SAFETY) ×2 IMPLANT
COAGULATOR SUCT 8FR VV (MISCELLANEOUS) IMPLANT
CONT SPEC 4OZ CLIKSEAL STRL BL (MISCELLANEOUS) ×4 IMPLANT
CORDS BIPOLAR (ELECTRODE) ×2 IMPLANT
COTTONBALL LRG STERILE PKG (GAUZE/BANDAGES/DRESSINGS) IMPLANT
DECANTER SPIKE VIAL GLASS SM (MISCELLANEOUS) ×2 IMPLANT
DEPRESSOR TONGUE BLADE STERILE (MISCELLANEOUS) IMPLANT
DRAIN SUBARACHNOID (WOUND CARE) IMPLANT
DRAPE EENT ADH APERT 15X15 STR (DRAPES) ×2 IMPLANT
DRAPE INCISE IOBAN 66X45 STRL (DRAPES) ×2 IMPLANT
DRAPE MICROSCOPE LEICA (MISCELLANEOUS) ×2 IMPLANT
DRAPE POUCH INSTRU U-SHP 10X18 (DRAPES) ×2 IMPLANT
DRAPE PROXIMA HALF (DRAPES) ×4 IMPLANT
DRESSING NASAL POPE 10X1.5X2.5 (GAUZE/BANDAGES/DRESSINGS) ×1 IMPLANT
DRESSING TELFA 8X3 (GAUZE/BANDAGES/DRESSINGS) IMPLANT
DRSG NASAL POPE 10X1.5X2.5 (GAUZE/BANDAGES/DRESSINGS) ×2
ELECT CAUTERY BLADE 6.4 (BLADE) ×4 IMPLANT
ELECT COATED BLADE 2.86 ST (ELECTRODE) ×2 IMPLANT
ELECT NEEDLE TIP 2.8 STRL (NEEDLE) ×2 IMPLANT
ELECT REM PT RETURN 9FT ADLT (ELECTROSURGICAL) ×2
ELECTRODE REM PT RTRN 9FT ADLT (ELECTROSURGICAL) ×1 IMPLANT
GAUZE PACKING FOLDED 1IN STRL (GAUZE/BANDAGES/DRESSINGS) IMPLANT
GAUZE PACKING FOLDED 2  STR (GAUZE/BANDAGES/DRESSINGS)
GAUZE PACKING FOLDED 2 STR (GAUZE/BANDAGES/DRESSINGS) IMPLANT
GAUZE SPONGE 2X2 8PLY STRL LF (GAUZE/BANDAGES/DRESSINGS) IMPLANT
GLOVE BIO SURGEON STRL SZ 6.5 (GLOVE) ×8 IMPLANT
GLOVE BIO SURGEON STRL SZ7 (GLOVE) IMPLANT
GLOVE BIO SURGEON STRL SZ7.5 (GLOVE) IMPLANT
GLOVE BIO SURGEON STRL SZ8 (GLOVE) IMPLANT
GLOVE BIO SURGEON STRL SZ8.5 (GLOVE) ×2 IMPLANT
GLOVE BIOGEL M 8.0 STRL (GLOVE) IMPLANT
GLOVE BIOGEL PI IND STRL 6.5 (GLOVE) ×1 IMPLANT
GLOVE BIOGEL PI INDICATOR 6.5 (GLOVE) ×1
GLOVE ECLIPSE 6.5 STRL STRAW (GLOVE) ×4 IMPLANT
GLOVE ECLIPSE 7.0 STRL STRAW (GLOVE) IMPLANT
GLOVE ECLIPSE 7.5 STRL STRAW (GLOVE) IMPLANT
GLOVE ECLIPSE 8.0 STRL XLNG CF (GLOVE) IMPLANT
GLOVE ECLIPSE 8.5 STRL (GLOVE) IMPLANT
GLOVE EXAM NITRILE LRG STRL (GLOVE) IMPLANT
GLOVE EXAM NITRILE MD LF STRL (GLOVE) IMPLANT
GLOVE EXAM NITRILE XL STR (GLOVE) IMPLANT
GLOVE EXAM NITRILE XS STR PU (GLOVE) IMPLANT
GLOVE INDICATOR 6.5 STRL GRN (GLOVE) IMPLANT
GLOVE INDICATOR 7.0 STRL GRN (GLOVE) ×2 IMPLANT
GLOVE INDICATOR 7.5 STRL GRN (GLOVE) IMPLANT
GLOVE INDICATOR 8.0 STRL GRN (GLOVE) IMPLANT
GLOVE INDICATOR 8.5 STRL (GLOVE) IMPLANT
GLOVE OPTIFIT SS 7.5 STRL LX (GLOVE) IMPLANT
GLOVE SS BIOGEL STRL SZ 6.5 (GLOVE) ×2 IMPLANT
GLOVE SS BIOGEL STRL SZ 7.5 (GLOVE) ×1 IMPLANT
GLOVE SS BIOGEL STRL SZ 8 (GLOVE) ×1 IMPLANT
GLOVE SUPERSENSE BIOGEL SZ 6.5 (GLOVE) ×2
GLOVE SUPERSENSE BIOGEL SZ 7.5 (GLOVE) ×1
GLOVE SUPERSENSE BIOGEL SZ 8 (GLOVE) ×1
GLOVE SURG SS PI 6.5 STRL IVOR (GLOVE) IMPLANT
GOWN BRE IMP SLV AUR LG STRL (GOWN DISPOSABLE) ×8 IMPLANT
GOWN BRE IMP SLV AUR XL STRL (GOWN DISPOSABLE) ×10 IMPLANT
GOWN PREVENTION PLUS XLARGE (GOWN DISPOSABLE) IMPLANT
GOWN STRL NON-REIN LRG LVL3 (GOWN DISPOSABLE) IMPLANT
KIT BASIN OR (CUSTOM PROCEDURE TRAY) ×2 IMPLANT
KIT ROOM TURNOVER OR (KITS) ×2 IMPLANT
MARKER SKIN DUAL TIP RULER LAB (MISCELLANEOUS) IMPLANT
NEEDLE 27GAX1X1/2 (NEEDLE) IMPLANT
NEEDLE HYPO 25X1 1.5 SAFETY (NEEDLE) ×2 IMPLANT
NEEDLE SPNL 22GX3.5 QUINCKE BK (NEEDLE) ×2 IMPLANT
NS IRRIG 1000ML POUR BTL (IV SOLUTION) ×2 IMPLANT
PAD ARMBOARD 7.5X6 YLW CONV (MISCELLANEOUS) ×6 IMPLANT
PATTIES SURGICAL .25X.25 (GAUZE/BANDAGES/DRESSINGS) IMPLANT
PATTIES SURGICAL .5 X.5 (GAUZE/BANDAGES/DRESSINGS) ×2 IMPLANT
PATTIES SURGICAL .5 X3 (DISPOSABLE) ×2 IMPLANT
PENCIL BUTTON HOLSTER BLD 10FT (ELECTRODE) ×2 IMPLANT
PENCIL FOOT CONTROL (ELECTRODE) IMPLANT
PIN MAYFIELD SKULL DISP (PIN) ×2 IMPLANT
RUBBERBAND STERILE (MISCELLANEOUS) ×4 IMPLANT
SHEET SIL 040 (INSTRUMENTS) IMPLANT
SPLINT NASAL DOYLE BI-VL (GAUZE/BANDAGES/DRESSINGS) ×2 IMPLANT
SPONGE GAUZE 2X2 STER 10/PKG (GAUZE/BANDAGES/DRESSINGS)
SPONGE GAUZE 4X4 12PLY (GAUZE/BANDAGES/DRESSINGS) IMPLANT
SPONGE LAP 4X18 X RAY DECT (DISPOSABLE) IMPLANT
STAPLER SKIN PROX WIDE 3.9 (STAPLE) ×2 IMPLANT
STRIP CLOSURE SKIN 1/2X4 (GAUZE/BANDAGES/DRESSINGS) ×2 IMPLANT
STRIP CLOSURE SKIN 1/4X4 (GAUZE/BANDAGES/DRESSINGS) IMPLANT
SUT BONE WAX W31G (SUTURE) IMPLANT
SUT CHROMIC 3 0 PS 2 (SUTURE) ×2 IMPLANT
SUT CHROMIC 4 0 P 3 18 (SUTURE) ×2 IMPLANT
SUT CHROMIC 4 0 PS 2 18 (SUTURE) ×2 IMPLANT
SUT ETHILON 3 0 PS 1 (SUTURE) IMPLANT
SUT ETHILON 4 0 PS 2 18 (SUTURE) IMPLANT
SUT ETHILON 6 0 P 1 (SUTURE) IMPLANT
SUT NOVAFIL 6 0 PRE 2 4412 13 (SUTURE) IMPLANT
SUT PLAIN 4 0 ~~LOC~~ 1 (SUTURE) IMPLANT
SUT PROLENE 6 0 BV (SUTURE) IMPLANT
SUT SILK 2 0 FS (SUTURE) IMPLANT
SUT VIC AB 2-0 CP2 18 (SUTURE) ×2 IMPLANT
SUT VIC AB 2-0 CT1 27 (SUTURE)
SUT VIC AB 2-0 CT1 27XBRD (SUTURE) IMPLANT
SUT VIC AB 2-0 CT2 18 VCP726D (SUTURE) ×2 IMPLANT
SUT VIC AB 3-0 SH 8-18 (SUTURE) IMPLANT
SUT VIC AB 4-0 P-3 18X BRD (SUTURE) IMPLANT
SUT VIC AB 4-0 P3 18 (SUTURE)
SYR 5ML LL (SYRINGE) IMPLANT
SYR CONTROL 10ML LL (SYRINGE) ×2 IMPLANT
SYR TB 1ML 25GX5/8 (SYRINGE) IMPLANT
TAPE CLOTH SURG 4X10 WHT LF (GAUZE/BANDAGES/DRESSINGS) ×2 IMPLANT
TOWEL OR 17X24 6PK STRL BLUE (TOWEL DISPOSABLE) ×2 IMPLANT
TOWEL OR 17X26 10 PK STRL BLUE (TOWEL DISPOSABLE) ×4 IMPLANT
TRAY ENT MC OR (CUSTOM PROCEDURE TRAY) ×2 IMPLANT
TRAY FOLEY CATH 14FRSI W/METER (CATHETERS) ×2 IMPLANT
UNDERPAD 30X30 INCONTINENT (UNDERPADS AND DIAPERS) IMPLANT
WATER STERILE IRR 1000ML POUR (IV SOLUTION) ×2 IMPLANT

## 2012-04-18 NOTE — Anesthesia Preprocedure Evaluation (Signed)
Anesthesia Evaluation  Patient identified by MRN, date of birth, ID band Patient awake    Reviewed: Allergy & Precautions, H&P , NPO status , Patient's Chart, lab work & pertinent test results  History of Anesthesia Complications (+) PONV  Airway Mallampati: I TM Distance: >3 FB Neck ROM: full    Dental   Pulmonary          Cardiovascular hypertension, Rhythm:regular Rate:Normal     Neuro/Psych  Headaches, PSYCHIATRIC DISORDERS    GI/Hepatic GERD-  ,  Endo/Other    Renal/GU      Musculoskeletal   Abdominal   Peds  Hematology   Anesthesia Other Findings   Reproductive/Obstetrics                           Anesthesia Physical Anesthesia Plan  ASA: II  Anesthesia Plan: General   Post-op Pain Management:    Induction: Intravenous  Airway Management Planned: Oral ETT  Additional Equipment: CVP  Intra-op Plan:   Post-operative Plan: Extubation in OR  Informed Consent: I have reviewed the patients History and Physical, chart, labs and discussed the procedure including the risks, benefits and alternatives for the proposed anesthesia with the patient or authorized representative who has indicated his/her understanding and acceptance.     Plan Discussed with: CRNA, Anesthesiologist and Surgeon  Anesthesia Plan Comments:         Anesthesia Quick Evaluation

## 2012-04-18 NOTE — H&P (Signed)
Subjective: The patient is a 71 year old white female who has had problems with her vision since August of 2013. She was seen by Dr. Luciana Axe who worked up with a visual field testing. This demonstrated bitemporal hemianopsia. The patient was further worked up with a brain MRI which demonstrated a sellar and suprasellar lesion with optic nerve chiasm compression. Patient was referred to me and I worked up further with a neurologic testing. Her prolactin level was mildly elevated which I attributed to stalk effect. I discussed the various treatment options with the patient and her family. We have discussed a transphenoidal resection of this tumor. I described the surgery to her. She has weighed the risks, benefits, and alternatives surgery and decided proceed with a transphenoidal resection of this tumor.  Past Medical History  Diagnosis Date  . Hypertension   . Shortness of breath     exertion  . Pneumonia     hx of pneumonia  . GERD (gastroesophageal reflux disease)   . Headache     hx migraines  . Depression   . Colon polyps   . Arthritis   . PONV (postoperative nausea and vomiting)     Past Surgical History  Procedure Date  . Colon resection 1988  . Colonoscopy 09/20/2011    Procedure: COLONOSCOPY;  Surgeon: Barrie Folk, MD;  Location: [facility] ENDOSCOPY;  Service: Endoscopy;  Laterality: N/A;  . Hot hemostasis 09/20/2011    Procedure: HOT HEMOSTASIS (ARGON PLASMA COAGULATION/BICAP);  Surgeon: Barrie Folk, MD;  Location: [facility];  Service: Endoscopy;  Laterality: N/A;  . Cataract extraction June/july 2013    bilateral eyes  . Colonoscopy 12/15/2011    Procedure: COLONOSCOPY;  Surgeon: Barrie Folk, MD;  Location: [facility] ENDOSCOPY;  Service: Endoscopy;  Laterality: N/A;  . Colonoscopy 01/26/2012    Procedure: COLONOSCOPY;  Surgeon: Florencia Reasons, MD;  Location: [facility] ENDOSCOPY;  Service: Endoscopy;  Laterality: N/A;  . Colon surgery   . Eye surgery     cataract bil  . Tumor removal    Left leg  . Back surgery     Allergies  Allergen Reactions  . Codeine Nausea And Vomiting    History  Substance Use Topics  . Smoking status: Never Smoker   . Smokeless tobacco: Not on file  . Alcohol Use: No     Comment: a couple times a year, one drink at a time    Family History  Problem Relation Age of Onset  . Colon cancer Brother   . Colon cancer Sister   . Heart disease Mother   . Heart disease Father    Prior to Admission medications   Medication Sig Start Date End Date Taking? Authorizing Provider  cholecalciferol (VITAMIN D) 1000 UNITS tablet Take 4,000 Units by mouth every morning.    Yes Historical Provider, MD  atorvastatin (LIPITOR) 20 MG tablet Take 20 mg by mouth every morning.    Historical Provider, MD  fenofibrate micronized (LOFIBRA) 134 MG capsule Take 134 mg by mouth at bedtime.     Historical Provider, MD  furosemide (LASIX) 40 MG tablet Take 40 mg by mouth every morning.    Historical Provider, MD  PARoxetine (PAXIL) 40 MG tablet Take 20 mg by mouth at bedtime.     Historical Provider, MD  ranitidine (ZANTAC) 300 MG capsule Take 300-600 mg by mouth See admin instructions. 1 capsule every morning but may take an extra at night if needed    Historical Provider, MD  Review of Systems  Positive ROS: As above  All other systems have been reviewed and were otherwise negative with the exception of those mentioned in the HPI and as above.  Objective: Vital signs in last 24 hours: Temp:  [98.1 F (36.7 C)] 98.1 F (36.7 C) (11/13 1020) Pulse Rate:  [79] 79  (11/13 1020) Resp:  [18] 18  (11/13 1020) BP: (149)/(92) 149/92 mmHg (11/13 1020) SpO2:  [99 %] 99 % (11/13 1020)  General Appearance: Alert, cooperative, no distress, appears stated age Head: Normocephalic, without obvious abnormality, atraumatic Eyes: PERRL, conjunctiva/corneas clear, EOM's intact, fundi benign, both eyes      Ears: Normal TM's and external ear canals, both ears Throat: Lips,  mucosa, and tongue normal; teeth and gums normal Neck: Supple, symmetrical, trachea midline, no adenopathy; thyroid: No enlargement/tenderness/nodules; no carotid bruit or JVD Back: Symmetric, no curvature, ROM normal, no CVA tenderness Lungs: Clear to auscultation bilaterally, respirations unlabored Heart: Regular rate and rhythm, S1 and S2 normal, no murmur, rub or gallop Abdomen: Soft, non-tender, bowel sounds active all four quadrants, no masses, no organomegaly Extremities: Extremities normal, atraumatic, no cyanosis or edema Pulses: 2+ and symmetric all extremities Skin: Skin color, texture, turgor normal, no rashes or lesions  NEUROLOGIC:   Mental status: alert and oriented, no aphasia, good attention span, Fund of knowledge/ memory ok Motor Exam - grossly normal Sensory Exam - grossly normal Reflexes:  Coordination - grossly normal Gait - grossly normal Balance - grossly normal Cranial Nerves: I: smell Not tested  II: visual acuity  OS: The patient has a bitemporal hemianopsia    OD: As above   II: visual fields Full to confrontation  II: pupils Equal, round, reactive to light  III,VII: ptosis None  III,IV,VI: extraocular muscles  Full ROM  V: mastication Normal  V: facial light touch sensation  Normal  V,VII: corneal reflex  Present  VII: facial muscle function - upper  Normal  VII: facial muscle function - lower Normal  VIII: hearing Not tested  IX: soft palate elevation  Normal  IX,X: gag reflex Present  XI: trapezius strength  5/5  XI: sternocleidomastoid strength 5/5  XI: neck flexion strength  5/5  XII: tongue strength  Normal    Data Review Lab Results  Component Value Date   WBC 4.6 04/18/2012   HGB 12.5 04/18/2012   HCT 37.2 04/18/2012   MCV 85.9 04/18/2012   PLT 224 04/18/2012   Lab Results  Component Value Date   NA 136 04/18/2012   K 3.5 04/18/2012   CL 103 04/18/2012   CO2 25 04/18/2012   BUN 10 04/18/2012   CREATININE 0.62 04/18/2012    GLUCOSE 95 04/18/2012   No results found for this basename: INR, PROTIME    Assessment/Plan: Sellar and suprasellar lesion, bitemporal hemianopsia: I discussed situation with the patient and her family. I reviewed the MR scan and lab testing with them. We have discussed the various treatment options including a transsphenoidal resection of this tumor. I described the surgery to them. I've shown him surgical models. We have discussed the risks, benefits, alternatives, and likelihood of achieving our goals with surgery. I have answered all their questions. The patient would like to proceed with the operation. Dr. Ezzard Standing is going to do the exposure surgery.   Tressie Stalker D 04/18/2012 12:46 PM

## 2012-04-18 NOTE — Transfer of Care (Signed)
Immediate Anesthesia Transfer of Care Note  Patient: Kristen [name]  Procedure(s) Performed: Procedure(s) (LRB) with comments: CRANIOTOMY HYPOPHYSECTOMY TRANSNASAL APPROACH (Bilateral) - Transphenoidal resection of Pituitary tumor.  TRANSNASAL APPROACH (Bilateral) - Transnasal approach  Patient Location: PACU  Anesthesia Type:General  Level of Consciousness: sedated, patient cooperative and responds to stimulation  Airway & Oxygen Therapy: Patient Spontanous Breathing and Patient connected to face mask oxygen  Post-op Assessment: Report given to PACU RN and Post -op Vital signs reviewed and stable  Post vital signs: Reviewed and stable  Complications: No apparent anesthesia complications

## 2012-04-18 NOTE — Progress Notes (Signed)
Subjective:  The patient is somnolent but easily arousable. She is in no apparent distress.  Objective: Vital signs in last 24 hours: Temp:  [98.1 F (36.7 C)] 98.1 F (36.7 C) (11/13 1020) Pulse Rate:  [79] 79  (11/13 1020) Resp:  [18] 18  (11/13 1020) BP: (149)/(92) 149/92 mmHg (11/13 1020) SpO2:  [99 %] 99 % (11/13 1020)  Intake/Output from previous day:   Intake/Output this shift: Total I/O In: 1700 [I.V.:1700] Out: 345 [Urine:325; Blood:20]  Physical exam the patient is somnolent but easy arousable. She is moving all 4 extremities. Her pupils are miotic and equal. There is no evidence of CSF rhinorrhea.  Lab Results:  [facility] 04/18/12 1042  WBC 4.6  HGB 12.5  HCT 37.2  PLT 224   BMET  Basename 04/18/12 1042  NA 136  K 3.5  CL 103  CO2 25  GLUCOSE 95  BUN 10  CREATININE 0.62  CALCIUM 9.3    Studies/Results: Dg Skull 1-3 Views  04/18/2012  *RADIOLOGY REPORT*  Clinical Data: Transphenoidal excision of pituitary tumor  DG C-ARM 1-60 MIN,SKULL - 1-3 VIEW  Comparison:  None.  Findings:  Two spot intraoperative radiographic images of the skull are provided for review.  Images demonstrate support apparatus overlying the expected location of the maxilla.  Endotracheal and enteric tube tips are excluded from view.  Cranial stabilization devices overlie the skull.  Image labeled #1 demonstrates a radiopaque surgical instrument with distal ring apparatus overlying the expected location of the sella.  Imaged labeled #2 demonstrates a linear radiopaque surgical instrument with tip overlying the expected location of the sella.  IMPRESSION: Intraoperative radiographic images of the skull as above.   Original Report Authenticated By: Tacey Ruiz, MD    Dg Chest 2 View  04/18/2012  *RADIOLOGY REPORT*  Clinical Data: History of spinal stenosis  CHEST - 2 VIEW  Comparison: Chest x-ray of 08/30/2006  Findings: No active infiltrate or effusion is seen.  Mediastinal contours are  stable.  The heart is mildly enlarged and stable. There are diffuse degenerative changes throughout the thoracic spine.  IMPRESSION: No active lung disease.  Stable chest x-ray.   Original Report Authenticated By: Dwyane Dee, M.D.    Dg C-arm 1-60 Min  04/18/2012  *RADIOLOGY REPORT*  Clinical Data: Transphenoidal excision of pituitary tumor  DG C-ARM 1-60 MIN,SKULL - 1-3 VIEW  Comparison:  None.  Findings:  Two spot intraoperative radiographic images of the skull are provided for review.  Images demonstrate support apparatus overlying the expected location of the maxilla.  Endotracheal and enteric tube tips are excluded from view.  Cranial stabilization devices overlie the skull.  Image labeled #1 demonstrates a radiopaque surgical instrument with distal ring apparatus overlying the expected location of the sella.  Imaged labeled #2 demonstrates a linear radiopaque surgical instrument with tip overlying the expected location of the sella.  IMPRESSION: Intraoperative radiographic images of the skull as above.   Original Report Authenticated By: Tacey Ruiz, MD     Assessment/Plan: The patient is doing well.  LOS: 0 days     Marquis Diles D 04/18/2012, 4:58 PM

## 2012-04-18 NOTE — Op Note (Signed)
Brief history: The patient is a 71 year old white female who has presented with vision loss. She was worked up with Astronomer by Dr. Luciana Axe. This demonstrated a bitemporal hemianopsia. He worked up further with a drain MRI which demonstrated a sellar and suprasellar lesion with compression of the optic chiasm. The patient underwent a neurologic workup which demonstrated a mildly elevated prolactin level. This was attributed to stalk effect. I discussed the various treatment options with the patient including surgery. She has weighed the risks, benefits, and alternatives surgery and decided proceed with a transsphenoidal resection of the tumor. Dr. Ezzard Standing is going to do the exposure.  Preop diagnosis: Pituitary macroadenoma with suprasellar extension , bitemporal hemianopsia  Postop diagnosis: Same  Procedure: Transsphenoidal resection of pituitary macroadenoma using microdissection and harvesting of abdominal fat graft.  Surgeon: Dr. Trey Paula Mickenzie Stolar/Dr. Narda Bonds  Assistant: Dr. Barbaraann Barthel  Anesthesia: Gen. endotracheal  Estimated blood loss: Minimal  Specimens: Tumor  Drains: None  Complications: None  Description of procedure: The patient was brought to the operating room by the anesthesia team. General endotracheal anesthesia was induced. The patient remained in the supine position. Her calvarium was then supported in the [place] 3 point headrest. Dr. Ezzard Standing position the patient and performed the exposure surgery. For further details of this part of the operation please refer to his operative note.  Once Dr. Ezzard Standing had exposed the floor of the sella via the sphenoid sinus I took over the operation. The floor of the sella was thin and I was able to remove it easily with the Kerrison punches. This exposed the dura along the floor of the sella. I coagulated this with bipolar electrocautery. I then incised the dura in the midline in a cruciate fashion with a 15 blade scalpel. This  exposed tissue which appeared to be tumor. I removed it with the ring curettes and the pituitary forceps. We sent off specimens to the pathologist. Upon removing a small amount of this tissue we encountered a large cystic cavity. The cyst was filled with light brownish fluid. We evacuated this fluid with suction. I then dissected around the borders of the cystic cavity with the ring curettes we obtained several more specimens of tumor. At this point it appeared that the diaphragm sella was sagging down into the cavity. We confirmed the extent of the resection using the intraoperative fluoroscopy. We then obtained hemostasis using Gelfoam. We then removed the Gelfoam and irrigated out the sella with bacitracin solution. He came back clear.  I then turned my attention to harvesting a fat graft. I used a scalpel to make an incision in the right upper quadrant of the patient's abdomen. I used the Industrial/product designer for exposure. I then used light cautery to obtain a abdominal subcutaneous fat graft. We then obtained hemostasis using electrocautery. We removed the retractor. I then reapproximated patient's subcutaneous tissue with interrupted 2-0 Vicryl suture. I reapproximated the skin with Steri-Strips and benzoin.  I then placed a small piece of the fat graft in the sella. I secured this in place with a small piece of cartilage.  At this point Dr. Ezzard Standing took over the operation and performed the closure. The drapes were then removed. The [place] 3 point headrest was removed from patient's calvarium. The patient was subtotally accident by the anesthesia team. She was then transported to the post anesthesia care unit in stable condition. All sponge instrument and needle counts were reportedly correct at the end this case.

## 2012-04-18 NOTE — Anesthesia Procedure Notes (Signed)
Procedure Name: Intubation Date/Time: 04/18/2012 1:51 PM Performed by: Julianne Rice K Pre-anesthesia Checklist: Emergency Drugs available, Timeout performed, Patient identified, Suction available and Patient being monitored Patient Re-evaluated:Patient Re-evaluated prior to inductionOxygen Delivery Method: Circle system utilized Preoxygenation: Pre-oxygenation with 100% oxygen Intubation Type: IV induction Ventilation: Mask ventilation without difficulty Laryngoscope Size: Mac and 3 Grade View: Grade II Tube type: Oral Tube size: 7.5 mm Number of attempts: 1 Airway Equipment and Method: Stylet and LTA kit utilized Placement Confirmation: ETT inserted through vocal cords under direct vision,  positive ETCO2 and breath sounds checked- equal and bilateral Secured at: 22 cm Tube secured with: Tape Dental Injury: Teeth and Oropharynx as per pre-operative assessment

## 2012-04-18 NOTE — Brief Op Note (Signed)
04/18/2012  4:28 PM  PATIENT:  Kristen [name]  71 y.o. female  PRE-OPERATIVE DIAGNOSIS:  Pituitary tumor  POST-OPERATIVE DIAGNOSIS:  Pituitary tumor  PROCEDURE:  Procedure(s) (LRB) with comments: CRANIOTOMY HYPOPHYSECTOMY TRANSNASAL APPROACH (Bilateral) - Transphenoidal resection of Pituitary tumor.  TRANSNASAL APPROACH (Bilateral) - Transnasal approach  SURGEON:  Surgeon(s) and Role: Panel 1:    * Cristi Loron, MD - Primary    * Carmela Hurt, MD  Panel 2:    * Drema Halon, MD - Primary  PHYSICIAN ASSISTANT:   ASSISTANTS: none   ANESTHESIA:   general  EBL:  Total I/O In: 1050 [I.V.:1050] Out: 325 [Urine:325]  BLOOD ADMINISTERED:none  DRAINS: none   LOCAL MEDICATIONS USED:  XYLOCAINE with EPI  SPECIMEN:  Source of Specimen:  pituitary gland  DISPOSITION OF SPECIMEN:  PATHOLOGY  COUNTS:  YES  TOURNIQUET:  * No tourniquets in log *  DICTATION: .Other Dictation: Dictation Number [id number]  PLAN OF CARE: Admit to inpatient   PATIENT DISPOSITION:  PACU - hemodynamically stable.   Delay start of Pharmacological VTE agent (>24hrs) due to surgical blood loss or risk of bleeding: yes

## 2012-04-18 NOTE — Preoperative (Signed)
Beta Blockers   Reason not to administer Beta Blockers:Not Applicable 

## 2012-04-18 NOTE — Anesthesia Postprocedure Evaluation (Signed)
  Anesthesia Post-op Note  Patient: Kristen [name]  Procedure(s) Performed: Procedure(s) (LRB) with comments: CRANIOTOMY HYPOPHYSECTOMY TRANSNASAL APPROACH (Bilateral) - Transphenoidal resection of Pituitary tumor.  TRANSNASAL APPROACH (Bilateral) - Transnasal approach  Patient Location: PACU  Anesthesia Type:General  Level of Consciousness: awake and alert   Airway and Oxygen Therapy: Patient Spontanous Breathing  Post-op Pain: mild  Post-op Assessment: Post-op Vital signs reviewed, Patient's Cardiovascular Status Stable, Respiratory Function Stable, Patent Airway, No signs of Nausea or vomiting and Pain level controlled  Post-op Vital Signs: stable  Complications: No apparent anesthesia complications

## 2012-04-19 ENCOUNTER — Encounter (HOSPITAL_COMMUNITY): Payer: Self-pay | Admitting: Neurosurgery

## 2012-04-19 DIAGNOSIS — R072 Precordial pain: Secondary | ICD-10-CM

## 2012-04-19 DIAGNOSIS — R000 Tachycardia, unspecified: Secondary | ICD-10-CM

## 2012-04-19 LAB — CK TOTAL AND CKMB (NOT AT ARMC)
CK, MB: 2.5 ng/mL (ref 0.3–4.0)
Total CK: 78 U/L (ref 7–177)

## 2012-04-19 LAB — BASIC METABOLIC PANEL
BUN: 9 mg/dL (ref 6–23)
Creatinine, Ser: 0.54 mg/dL (ref 0.50–1.10)
GFR calc Af Amer: >90 mL/min (ref >90)
GFR calc non Af Amer: >90 mL/min (ref >90)
Potassium: 3.5 mEq/L (ref 3.5–5.1)

## 2012-04-19 LAB — TROPONIN I: Troponin I: <0.3 ng/mL (ref <0.30)

## 2012-04-19 MED ORDER — METOPROLOL TARTRATE 25 MG PO TABS
25.0000 mg | ORAL_TABLET | Freq: Two times a day (BID) | ORAL | Status: DC
  Administered 2012-04-19 – 2012-04-20 (×2): 25 mg via ORAL
  Filled 2012-04-19 (×3): qty 1

## 2012-04-19 MED ORDER — PANTOPRAZOLE SODIUM 40 MG PO TBEC
40.0000 mg | DELAYED_RELEASE_TABLET | Freq: Every day | ORAL | Status: DC
  Administered 2012-04-19 – 2012-04-21 (×3): 40 mg via ORAL
  Filled 2012-04-19 (×3): qty 1

## 2012-04-19 MED ORDER — DEXAMETHASONE 4 MG PO TABS
4.0000 mg | ORAL_TABLET | Freq: Four times a day (QID) | ORAL | Status: DC
  Filled 2012-04-19 (×3): qty 1

## 2012-04-19 MED ORDER — DEXAMETHASONE 4 MG PO TABS: 4.0000 mg | ORAL_TABLET | Freq: Three times a day (TID) | ORAL | Status: DC

## 2012-04-19 MED ORDER — AMIODARONE HCL IN DEXTROSE 360-4.14 MG/200ML-% IV SOLN
60.0000 mg/h | INTRAVENOUS | Status: DC
  Administered 2012-04-19: 30.6 mg/h via INTRAVENOUS
  Administered 2012-04-19: 60 mg/h via INTRAVENOUS
  Filled 2012-04-19 (×9): qty 200

## 2012-04-19 MED ORDER — DILTIAZEM HCL 100 MG IV SOLR
5.0000 mg/h | INTRAVENOUS | Status: DC
  Administered 2012-04-19 (×2): 10 mg/h via INTRAVENOUS
  Filled 2012-04-19: qty 100

## 2012-04-19 MED ORDER — DEXAMETHASONE 6 MG PO TABS
6.0000 mg | ORAL_TABLET | Freq: Four times a day (QID) | ORAL | Status: AC
  Administered 2012-04-19 – 2012-04-21 (×8): 6 mg via ORAL
  Filled 2012-04-19 (×8): qty 1

## 2012-04-19 NOTE — Progress Notes (Signed)
The patient has developed a rapid regular supraventricular tachycardia. She appears comfortable. An EKG and cardiac enzymes are pending. I've contacted lobe our cardiology. They have kindly agreed to see the patient.

## 2012-04-19 NOTE — Progress Notes (Signed)
Will continue with beta blockade given atrial tachycardia.  Amiodarone 24 hours.

## 2012-04-19 NOTE — Consult Note (Signed)
CARDIOLOGY CONSULT NOTE  Patient ID: ESRA FRANKOWSKI MRN: [id number], DOB/AGE: 1941/04/14   Admit date: 04/18/2012 Date of Consult: 04/19/2012  Primary Physician: Nadean Corwin, MD Primary Cardiologist: New to [facility] Reason for Consultation: Tachycardia  History of Present Illness Ms. Beltran is a very pleasant 71 year old woman with HTN, dyslipidemia and GERD who was admitted following transnasal resection of a pituitary tumor yesterday. This afternoon she developed an abrupt tachycardia; therefore, we have been asked to see her in consultation. Ms. Natt reports 2 episodes of tachypalpitations in the last year. She describes "racing" palpitations that begin and end abruptly, lasting approximately 15 minutes in duration. These palpitations are accompanied by chest "tightness", SOB, cough and dizziness. She denies syncope. Currently she reports chest tightness but denies palpitations or SOB. She is hemodynamically stable. Her first set of cardiac enzymes are negative. She denies any history of CAD, MI, CHF or valvular heart disease.  Past Medical History Past Medical History  Diagnosis Date  . Hypertension   . Shortness of breath     exertion  . Pneumonia     hx of pneumonia  . GERD (gastroesophageal reflux disease)   . Headache     hx migraines  . Depression   . Colon polyps   . Arthritis   . PONV (postoperative nausea and vomiting)     Past Surgical History Past Surgical History  Procedure Date  . Colon resection 1988  . Colonoscopy 09/20/2011    Procedure: COLONOSCOPY;  Surgeon: Barrie Folk, MD;  Location: [facility] ENDOSCOPY;  Service: Endoscopy;  Laterality: N/A;  . Hot hemostasis 09/20/2011    Procedure: HOT HEMOSTASIS (ARGON PLASMA COAGULATION/BICAP);  Surgeon: Barrie Folk, MD;  Location: [facility]COPY;  Service: Endoscopy;  Laterality: N/A;  . Cataract extraction June/july 2013    bilateral eyes  . Colonoscopy 12/15/2011    Procedure: COLONOSCOPY;  Surgeon: Barrie Folk, MD;  Location: [facility] ENDOSCOPY;  Service: Endoscopy;  Laterality: N/A;  . Colonoscopy 01/26/2012    Procedure: COLONOSCOPY;  Surgeon: Florencia Reasons, MD;  Location: [facility] ENDOSCOPY;  Service: Endoscopy;  Laterality: N/A;  . Colon surgery   . Eye surgery     cataract bil  . Tumor removal     Left leg  . Back surgery      Allergies/Intolerances Allergies  Allergen Reactions  . Codeine Nausea And Vomiting    Inpatient Medications    . atorvastatin  20 mg Oral q1800  . [EXPIRED] bacitracin      .  ceFAZolin (ANCEF) IV  2 g Intravenous Q8H  . dexamethasone  4 mg Oral Q6H  . dexamethasone  4 mg Oral Q8H  . dexamethasone  6 mg Oral Q6H  . docusate sodium  100 mg Oral BID  . famotidine  20 mg Oral BID  . fenofibrate  160 mg Oral Daily  . furosemide  40 mg Oral q morning - 10a  . hydrocortisone sod succinate (SOLU-CORTEF) injection  100 mg Intravenous Q6H  . [COMPLETED] influenza  inactive virus vaccine  0.5 mL Intramuscular Tomorrow-1000  . levetiracetam  500 mg Intravenous Q12H  . mupirocin ointment   Nasal BID  . [EXPIRED] ondansetron      . pantoprazole  40 mg Oral Daily  . PARoxetine  20 mg Oral QHS  . [COMPLETED] pneumococcal 23 valent vaccine  0.5 mL Intramuscular Tomorrow-1000  . [EXPIRED] sodium chloride      . [DISCONTINUED]  ceFAZolin (ANCEF) IV  2 g  Intravenous On Call to OR  . [DISCONTINUED] dexamethasone  4 mg Intravenous Q6H  . [DISCONTINUED] dexamethasone  4 mg Intravenous Q8H  . [DISCONTINUED] dexamethasone  6 mg Intravenous Q6H  . [DISCONTINUED] pantoprazole (PROTONIX) IV  40 mg Intravenous QHS  . [DISCONTINUED] PARoxetine  20 mg Oral QHS      . lactated ringers 75 mL/hr at 04/19/12 1400    Family History Family History  Problem Relation Age of Onset  . Colon cancer Brother   . Colon cancer Sister   . Heart disease Mother   . Heart disease Father      Social History Social History  . Marital Status: Widowed   Social History Main Topics  .  Smoking status: Never Smoker   . Smokeless tobacco: Not on file  . Alcohol Use: No     Comment: a couple times a year, one drink at a time  . Drug Use: No   Review of Systems General: No chills, fever, night sweats or weight changes  Cardiovascular: +chest pain +palpitations No edema, orthopnea, paroxysmal nocturnal dyspnea Dermatological: No rash, lesions or masses Respiratory: +cough with palpitations Urologic: No hematuria, dysuria Abdominal: No nausea, vomiting, diarrhea, bright red blood per rectum, melena, or hematemesis Neurologic: No visual changes, weakness, changes in mental status All other systems reviewed and are otherwise negative except as noted above.  Physical Exam Blood pressure 141/80, pulse 133, temperature 98.2 F (36.8 C), temperature source Oral, resp. rate 16, height 5\' 6"  (1.676 m), weight 170 lb 3.1 oz (77.2 kg), SpO2 96.00%.  General: Well developed, well appearing 71 year old female in no acute distress. HEENT: Normocephalic, atraumatic. EOMs intact. Sclera nonicteric. Oropharynx clear.  Neck: Supple. No JVD. Lungs: Respirations regular and unlabored, CTA bilaterally. No wheezes, rales or rhonchi. Heart: Regular, tachycardic. S1, S2 present. No murmurs, rub, S3 or S4. Abdomen: Soft, non-tender, non-distended. BS present x 4 quadrants. No hepatosplenomegaly.  Extremities: No clubbing, cyanosis or edema. DP/PT/Radials 2+ and equal bilaterally. Psych: Normal affect. Neuro: Alert and oriented X 3. Moves all extremities spontaneously. Musculoskeletal: No kyphosis. Skin: Intact. Warm and dry. No rashes or petechiae in exposed areas.   Labs CK 73, CK-MB 2.5, troponin <0.3  Lab Results  Component Value Date   WBC 4.6 04/18/2012   HGB 12.5 04/18/2012   HCT 37.2 04/18/2012   MCV 85.9 04/18/2012   PLT 224 04/18/2012    Lab 04/19/12 0515  NA 135  K 3.5  CL 100  CO2 23  BUN 9  CREATININE 0.54  CALCIUM 9.1  PROT --  BILITOT --  ALKPHOS --  ALT --    AST --  GLUCOSE 186*   No components found with this basename: MAGNESIUM No components found with this basename: POCBNP:3 No results found for this basename: TSH,T4TOTAL,FREET3,T3FREE,THYROIDAB in the last 72 hours   Radiology/Studies Dg Chest 2 View 04/18/2012  *RADIOLOGY REPORT*  Clinical Data: History of spinal stenosis  CHEST - 2 VIEW  Comparison: Chest x-ray of 08/30/2006  Findings: No active infiltrate or effusion is seen.  Mediastinal contours are stable.  The heart is mildly enlarged and stable. There are diffuse degenerative changes throughout the thoracic spine.  IMPRESSION: No active lung disease.  Stable chest x-ray.   Original Report Authenticated By: Dwyane Dee, M.D.    US Carotid Duplex Bilateral 04/11/2012  *RADIOLOGY REPORT*  Clinical Data: Carotid bruit, history of hypertension and visual disturbance, hypercholesterolemia, headaches  BILATERAL CAROTID DUPLEX ULTRASOUND   IMPRESSION:  1.  Minimal amount of bilateral intimal wall thickening, not resulting in elevated peak systolic velocities to suggest hemodynamically significant stenosis. 2.  Incidental note is made of an approximately 0.9 cm hypoechoic solid nodule within the right lobe of the thyroid.  Further evaluation with dedicated thyroid ultrasound may be performed as clinically indicated.   Original Report Authenticated By: Tacey Ruiz, MD     12-lead ECG today at 1320 shows a regular, narrow complex tachycardia at 145 bpm, long RP tachycardia, most consistent with an atrial tachycardia although cannot completely exclude atypical atrial flutter; there are no ischemic ST-T wave changes Telemetry shows regular, narrow complex tachycardia at 134 bpm - long RP tachycardia, most likely an atrial tachycardia   Assessment and Plan 1. Atrial tachycardia Ms. Kooyman has an atrial tachycardia. She is mildly symptomatic and hemodynamically stable. She was given IV labetalol x1 with minimal improvement. At this time, Dr. Eden Emms  recommends adding IV diltiazem infusion in addition to PO metoprolol tartrate. We agree with cycling her cardiac enzymes and will obtain an echocardiogram and thyroid profile. We will follow with you.   This plan of care was formulated with Dr. Eden Emms who was in to interview and examine the patient. Signed, EDMISTEN, Nehemiah Settle, PA-C 04/19/2012, 2:07 PM  Appears to be long RP tachycardia which can be hard to break.  Discussed with Dr Lovell Sheehan and ideally she would have no anticoagulation for 5 days.  Risk of apoplexy if she bleeds post transphenoidal resection.  Was on atenolol at home and will give oral dose now.  Low risk of thombus with atrial tachycardia Start cardizem and amiodarone to help with rate control and conversion.  She is tolerating it well and is essentially asymptomatic with good BP  Charlton Haws

## 2012-04-19 NOTE — Progress Notes (Signed)
Patient ID: Kristen [name], female   DOB: December 11, 1940, 71 y.o.   MRN: [id number] Subjective:  The patient is alert and pleasant. She looks well. She says her vision is "much better".  Objective: Vital signs in last 24 hours: Temp:  [97.9 F (36.6 C)-98.7 F (37.1 C)] 97.9 F (36.6 C) (11/14 0000) Pulse Rate:  [58-81] 81  (11/14 0700) Resp:  [9-22] 13  (11/14 0700) BP: (126-184)/(59-103) 130/66 mmHg (11/14 0700) SpO2:  [93 %-100 %] 93 % (11/14 0700) Weight:  [77.2 kg (170 lb 3.1 oz)] 77.2 kg (170 lb 3.1 oz) (11/13 1930)  Intake/Output from previous day: 11/13 0701 - 11/14 0700 In: 3795 [P.O.:840; I.V.:2750; IV Piggyback:205] Out: 2005 [Urine:1985; Blood:20] Intake/Output this shift:    Physical exam the patient is alert and oriented. Her vision is grossly normal. There is no evidence of CSF rhinorrhea. She is moving all 4 extremities well. Her speech is normal.  Lab Results:  Basename 04/18/12 1042  WBC 4.6  HGB 12.5  HCT 37.2  PLT 224   BMET  Basename 04/19/12 0515 04/18/12 1042  NA 135 136  K 3.5 3.5  CL 100 103  CO2 23 25  GLUCOSE 186* 95  BUN 9 10  CREATININE 0.54 0.62  CALCIUM 9.1 9.3    Studies/Results: Dg Skull 1-3 Views  04/18/2012  *RADIOLOGY REPORT*  Clinical Data: Transphenoidal excision of pituitary tumor  DG C-ARM 1-60 MIN,SKULL - 1-3 VIEW  Comparison:  None.  Findings:  Two spot intraoperative radiographic images of the skull are provided for review.  Images demonstrate support apparatus overlying the expected location of the maxilla.  Endotracheal and enteric tube tips are excluded from view.  Cranial stabilization devices overlie the skull.  Image labeled #1 demonstrates a radiopaque surgical instrument with distal ring apparatus overlying the expected location of the sella.  Imaged labeled #2 demonstrates a linear radiopaque surgical instrument with tip overlying the expected location of the sella.  IMPRESSION: Intraoperative radiographic images of  the skull as above.   Original Report Authenticated By: Tacey Ruiz, MD    Dg Chest 2 View  04/18/2012  *RADIOLOGY REPORT*  Clinical Data: History of spinal stenosis  CHEST - 2 VIEW  Comparison: Chest x-ray of 08/30/2006  Findings: No active infiltrate or effusion is seen.  Mediastinal contours are stable.  The heart is mildly enlarged and stable. There are diffuse degenerative changes throughout the thoracic spine.  IMPRESSION: No active lung disease.  Stable chest x-ray.   Original Report Authenticated By: Dwyane Dee, M.D.    Dg Chest Port 1 View  04/18/2012  *RADIOLOGY REPORT*  Clinical Data: Post right IJ central venous line placement  PORTABLE CHEST - 1 VIEW  Comparison:  Chest x-ray of 04/18/2012.  Findings: A right IJ central venous line is now present with the tip overlying the lower SVC.  No pneumothorax is seen.  There has been an increase in bibasilar linear atelectasis right greater than left.  No effusion is seen.  Heart size is stable.  IMPRESSION:  1.  Right IJ central venous line tip overlies the lower SVC.  No pneumothorax. 2.  Increase in bibasilar linear atelectasis.   Original Report Authenticated By: Dwyane Dee, M.D.    Dg C-arm 1-60 Min  04/18/2012  *RADIOLOGY REPORT*  Clinical Data: Transphenoidal excision of pituitary tumor  DG C-ARM 1-60 MIN,SKULL - 1-3 VIEW  Comparison:  None.  Findings:  Two spot intraoperative radiographic images of the skull are provided for  review.  Images demonstrate support apparatus overlying the expected location of the maxilla.  Endotracheal and enteric tube tips are excluded from view.  Cranial stabilization devices overlie the skull.  Image labeled #1 demonstrates a radiopaque surgical instrument with distal ring apparatus overlying the expected location of the sella.  Imaged labeled #2 demonstrates a linear radiopaque surgical instrument with tip overlying the expected location of the sella.  IMPRESSION: Intraoperative radiographic images of the  skull as above.   Original Report Authenticated By: Tacey Ruiz, MD     Assessment/Plan: Postop day 1: The patient is doing well. Her labs look good there is no evidence of diabetes insipidus. We will move her to the floor today. She may be able to go home tomorrow.  LOS: 1 day     Camyah Pultz D 04/19/2012, 8:07 AM

## 2012-04-19 NOTE — Progress Notes (Signed)
UR COMPLETED  

## 2012-04-19 NOTE — Progress Notes (Signed)
Ms. Scherr has converted to NSR. She remains hemodynamically stable. I discussed plan of care with Dr. Eden Emms. We will discontinue IV diltiazem and continue metoprolol tartrate. For the next 24 hours will give IV amiodarone infusion to promote maintenance SR in the post-operative period. No need for anticoagulation at this time.

## 2012-04-19 NOTE — Progress Notes (Signed)
POD 1 Awake Alert   VSS AF No bleeding or drainage from nose.  Minimal discomfort. Will plan on removing nasal packs tomorrow.

## 2012-04-19 NOTE — Progress Notes (Signed)
Pts heart rate suddemly increased to 150. BP stable Pt. States she has tightness in center of chest 3/10 discomfort.. Pt returned to bed, o2 placed, 12 lead ordered, MD notified. Continue close monitoring.

## 2012-04-19 NOTE — Op Note (Signed)
NAMELORIEN, PARSLOW NO.:  [id number]  MEDICAL RECORD NO.:  [id number]  LOCATION:  3108                         FACILITY:  [facility]  PHYSICIAN:  Kristine Garbe. Ezzard Standing, M.D.DATE OF BIRTH:  Jul 07, 1940  DATE OF PROCEDURE:  04/18/2012 DATE OF DISCHARGE:                              OPERATIVE REPORT   PREOPERATIVE DIAGNOSIS:  Pituitary tumor.  POSTOPERATIVE DIAGNOSIS:  Pituitary tumor.  OPERATION PERFORMED:  Transseptal transsphenoidal resection of pituitary tumor.  SURGEON:  Kristine Garbe. Ezzard Standing, MD  CO-SURGEON:  Cristi Loron, MD  ANESTHESIA:  General endotracheal.  COMPLICATIONS:  None.  BRIEF CLINICAL NOTE:  Kristen [name] is a 71 year old female who several weeks ago noticed some decline in her vision.  She had cataract surgery performed by Dr. Emmit Pomfret a couple months ago and noticed some decrease in her vision and on evaluation of her visual loss, she was found to have a pituitary tumor that extended up to the optic chiasm. He has felt that this was contributing some to her visual changes.  She has had some gradual decline in her vision over the last week or 2 and is taken to the operating room semi-emergently for resection of pituitary tumor via transsphenoidal approach.  DESCRIPTION OF PROCEDURE:  The patient was admitted via the neuro OR and positioned in pins by myself and Dr. Lovell Sheehan.  The nose and abdomen was then prepped with Betadine solution.  The nose was then further prepped by myself with cotton pledgets soaked in Afrin.  Septum and floor of the nose was injected with approximately 10 mL of 1% Xylocaine with 1:100,000 epinephrine.  A extended hemitransfixion incision was made along the cartilage of the septum on the right side.  This extended down to the floor of the nose.  Mucoperichondrial and mucoperiosteal flaps were elevated posteriorly on the right side.  Then on the left side, the mucoperiosteum was elevated off to the floor of  the nose up to the cartilaginous septum.  Cartilaginous septum was then transected vertically about 2 to 2.5 cm posteriorly just anterior to the bony septum.  The cartilaginous septum was mobilized off of the maxillary crest into the left airway.  Some cartilage and bone was resected and used later for closure.  The mucoperiosteal flaps were elevated posteriorly on both sides back to the base of the sphenoid sinus.  The septal bone was removed.  The sphenoid sinus was entered with a small osteotome and the sphenoidotomy was enlarged with Kerrison's.  The Hardy retractors were then positioned and the sphenoidotomy was enlarged.  The microscope was brought in for visualization to be used by Neurosurgery. The sella was exposed.  Majority of the mucoperiosteum was removed from the sphenoid sinuses on both sides of the septum.  After adequate exposure of the sella, Dr. Lovell Sheehan scrubbed in for removal of the sphenoid tumor.  Following removal of the sphenoid tumor, I was called back in for closure.  The sella and sphenoid had been packed with some fat.  The cartilaginous septum was brought back onto the maxillary crest.  The hemitransfixion incision was closed with interrupted 4-0 chromic suture.  The septum was basted with another 4-0 chromic suture. Silastic splints were  secured to either side of the septum with a 3-0 nylon suture.  The nose was then packed on both sides with anterior posterior Merocel packs.  They were soaked in bacitracin ointment and hydrated with saline.  This completed the procedure.  The patient was subsequently awakened from anesthesia and transferred to recovery room postop in stable condition.  Will be admitted for observation in the neuro ICU for the next 48 hours.  We will plan on removing the nasal packs in 2-3 days.  However, follow up in my office in 7-10 days for recheck and removal of the silastic splints on the septum.           ______________________________ Kristine Garbe. Ezzard Standing, M.D.     CEN/MEDQ  D:  04/18/2012  T:  04/19/2012  Job:  [id number]  cc:   Cristi Loron, M.D.

## 2012-04-19 NOTE — Progress Notes (Signed)
  Echocardiogram 2D Echocardiogram has been performed.  Kristen [name] 04/19/2012, 5:54 PM

## 2012-04-20 DIAGNOSIS — I10 Essential (primary) hypertension: Secondary | ICD-10-CM

## 2012-04-20 DIAGNOSIS — E7800 Pure hypercholesterolemia, unspecified: Secondary | ICD-10-CM

## 2012-04-20 LAB — CK TOTAL AND CKMB (NOT AT ARMC)
CK, MB: 2.6 ng/mL (ref 0.3–4.0)
Total CK: 81 U/L (ref 7–177)

## 2012-04-20 LAB — BASIC METABOLIC PANEL
BUN: 10 mg/dL (ref 6–23)
Calcium: 9.1 mg/dL (ref 8.4–10.5)
GFR calc non Af Amer: 89 mL/min — ABNORMAL LOW (ref >90)
Glucose, Bld: 166 mg/dL — ABNORMAL HIGH (ref 70–99)

## 2012-04-20 LAB — TROPONIN I: Troponin I: <0.3 ng/mL (ref <0.30)

## 2012-04-20 NOTE — Progress Notes (Signed)
Patient ID: Kristen [name], female   DOB: Oct 13, 1940, 71 y.o.   MRN: [id number] Subjective:  The patient is alert and pleasant. She looks and feels well. She has no complaints.  Objective: Vital signs in last 24 hours: Temp:  [98 F (36.7 C)-99.3 F (37.4 C)] 98 F (36.7 C) (11/15 0400) Pulse Rate:  [58-146] 58  (11/15 0700) Resp:  [11-23] 13  (11/15 0700) BP: (104-161)/(51-93) 141/57 mmHg (11/15 0700) SpO2:  [92 %-99 %] 95 % (11/15 0700) Weight:  [78.9 kg (173 lb 15.1 oz)] 78.9 kg (173 lb 15.1 oz) (11/15 0600)  Intake/Output from previous day: 11/14 0701 - 11/15 0700 In: 2600 [P.O.:240; I.V.:2000; IV Piggyback:360] Out: 1825 [Urine:1825] Intake/Output this shift:    Physical exam the patient is alert and oriented. Her pupils in vision are equal. Her speech is normal her strength is normal. There is no evidence of CSF rhinorrhea.  Lab Results:  [facility] 04/18/12 1042  WBC 4.6  HGB 12.5  HCT 37.2  PLT 224   BMET  Basename 04/20/12 0420 04/19/12 0515  NA 138 135  K 3.8 3.5  CL 104 100  CO2 28 23  GLUCOSE 166* 186*  BUN 10 9  CREATININE 0.62 0.54  CALCIUM 9.1 9.1    Studies/Results: Dg Skull 1-3 Views  04/18/2012  *RADIOLOGY REPORT*  Clinical Data: Transphenoidal excision of pituitary tumor  DG C-ARM 1-60 MIN,SKULL - 1-3 VIEW  Comparison:  None.  Findings:  Two spot intraoperative radiographic images of the skull are provided for review.  Images demonstrate support apparatus overlying the expected location of the maxilla.  Endotracheal and enteric tube tips are excluded from view.  Cranial stabilization devices overlie the skull.  Image labeled #1 demonstrates a radiopaque surgical instrument with distal ring apparatus overlying the expected location of the sella.  Imaged labeled #2 demonstrates a linear radiopaque surgical instrument with tip overlying the expected location of the sella.  IMPRESSION: Intraoperative radiographic images of the skull as above.   Original  Report Authenticated By: Tacey Ruiz, MD    Dg Chest 2 View  04/18/2012  *RADIOLOGY REPORT*  Clinical Data: History of spinal stenosis  CHEST - 2 VIEW  Comparison: Chest x-ray of 08/30/2006  Findings: No active infiltrate or effusion is seen.  Mediastinal contours are stable.  The heart is mildly enlarged and stable. There are diffuse degenerative changes throughout the thoracic spine.  IMPRESSION: No active lung disease.  Stable chest x-ray.   Original Report Authenticated By: Dwyane Dee, M.D.    Dg Chest Port 1 View  04/18/2012  *RADIOLOGY REPORT*  Clinical Data: Post right IJ central venous line placement  PORTABLE CHEST - 1 VIEW  Comparison:  Chest x-ray of 04/18/2012.  Findings: A right IJ central venous line is now present with the tip overlying the lower SVC.  No pneumothorax is seen.  There has been an increase in bibasilar linear atelectasis right greater than left.  No effusion is seen.  Heart size is stable.  IMPRESSION:  1.  Right IJ central venous line tip overlies the lower SVC.  No pneumothorax. 2.  Increase in bibasilar linear atelectasis.   Original Report Authenticated By: Dwyane Dee, M.D.    Dg C-arm 1-60 Min  04/18/2012  *RADIOLOGY REPORT*  Clinical Data: Transphenoidal excision of pituitary tumor  DG C-ARM 1-60 MIN,SKULL - 1-3 VIEW  Comparison:  None.  Findings:  Two spot intraoperative radiographic images of the skull are provided for review.  Images demonstrate  support apparatus overlying the expected location of the maxilla.  Endotracheal and enteric tube tips are excluded from view.  Cranial stabilization devices overlie the skull.  Image labeled #1 demonstrates a radiopaque surgical instrument with distal ring apparatus overlying the expected location of the sella.  Imaged labeled #2 demonstrates a linear radiopaque surgical instrument with tip overlying the expected location of the sella.  IMPRESSION: Intraoperative radiographic images of the skull as above.   Original Report  Authenticated By: Tacey Ruiz, MD     Assessment/Plan: Postop day #2: The patient is doing well neurologically and there is no signs of diabetes insipidus. Her urine output and sodium are normal.  Supraventricular tachycardia: I appreciate Dr. Talmadge Chad help. The patient's back into sinus rhythm.  I will transfer the patient to the floor on telemetry if it's okay with cardiology. The patient will be tentatively discharged to home tomorrow. I gave the patient discharge instructions.  LOS: 2 days     Sary Bogie D 04/20/2012, 7:37 AM

## 2012-04-20 NOTE — Progress Notes (Signed)
Patient ID: Kristen [name], female   DOB: [birth year], 71 y.o.   MRN: [id number]    Subjective:  Denies SSCP, palpitations or Dyspnea   Objective:  Filed Vitals:   04/20/12 0500 04/20/12 0600 04/20/12 0700 04/20/12 0808  BP: 140/61 114/61 141/57   Pulse: 60 61 58   Temp:    97.6 F (36.4 C)  TempSrc:    Axillary  Resp: 11 11 13    Height:      Weight:  173 lb 15.1 oz (78.9 kg)    SpO2: 95% 95% 95%     Intake/Output from previous day:  Intake/Output Summary (Last 24 hours) at 04/20/12 4098 Last data filed at 04/20/12 0700  Gross per 24 hour  Intake 2525.03 ml  Output   1825 ml  Net 700.03 ml    Physical Exam: Affect appropriate Healthy:  appears stated age HEENT transphenoidal surgery Neck supple with no adenopathy JVP normal no bruits no thyromegaly Lungs clear with no wheezing and good diaphragmatic motion Heart:  S1/S2 no murmur, no rub, gallop or click PMI normal Abdomen: benighn, BS positve, no tenderness, no AAA no bruit.  No HSM or HJR Distal pulses intact with no bruits No edema Neuro non-focal Skin warm and dry No muscular weakness   Lab Results: Basic Metabolic Panel:  Basename 04/20/12 0420 04/19/12 0515  NA 138 135  K 3.8 3.5  CL 104 100  CO2 28 23  GLUCOSE 166* 186*  BUN 10 9  CREATININE 0.62 0.54  CALCIUM 9.1 9.1  MG -- --  PHOS -- --    CBC:  Basename 04/18/12 1042  WBC 4.6  NEUTROABS --  HGB 12.5  HCT 37.2  MCV 85.9  PLT 224   Cardiac Enzymes:  Basename 04/20/12 0104 04/19/12 1916 04/19/12 1337  CKTOTAL 81 78 73  CKMB 2.6 2.5 2.5  CKMBINDEX -- -- --  TROPONINI <0.30 <0.30 <0.30   Thyroid Function Tests:  Basename 04/19/12 1700  TSH 0.791  T4TOTAL --  T3FREE --  THYROIDAB --    Imaging: Dg Skull 1-3 Views  04/18/2012  *RADIOLOGY REPORT*  Clinical Data: Transphenoidal excision of pituitary tumor  DG C-ARM 1-60 MIN,SKULL - 1-3 VIEW  Comparison:  None.  Findings:  Two spot intraoperative radiographic images of  the skull are provided for review.  Images demonstrate support apparatus overlying the expected location of the maxilla.  Endotracheal and enteric tube tips are excluded from view.  Cranial stabilization devices overlie the skull.  Image labeled #1 demonstrates a radiopaque surgical instrument with distal ring apparatus overlying the expected location of the sella.  Imaged labeled #2 demonstrates a linear radiopaque surgical instrument with tip overlying the expected location of the sella.  IMPRESSION: Intraoperative radiographic images of the skull as above.   Original Report Authenticated By: Tacey Ruiz, MD    Dg Chest 2 View  04/18/2012  *RADIOLOGY REPORT*  Clinical Data: History of spinal stenosis  CHEST - 2 VIEW  Comparison: Chest x-ray of 08/30/2006  Findings: No active infiltrate or effusion is seen.  Mediastinal contours are stable.  The heart is mildly enlarged and stable. There are diffuse degenerative changes throughout the thoracic spine.  IMPRESSION: No active lung disease.  Stable chest x-ray.   Original Report Authenticated By: Dwyane Dee, M.D.    Dg Chest Port 1 View  04/18/2012  *RADIOLOGY REPORT*  Clinical Data: Post right IJ central venous line placement  PORTABLE CHEST - 1 VIEW  Comparison:  Chest  x-ray of 04/18/2012.  Findings: A right IJ central venous line is now present with the tip overlying the lower SVC.  No pneumothorax is seen.  There has been an increase in bibasilar linear atelectasis right greater than left.  No effusion is seen.  Heart size is stable.  IMPRESSION:  1.  Right IJ central venous line tip overlies the lower SVC.  No pneumothorax. 2.  Increase in bibasilar linear atelectasis.   Original Report Authenticated By: Dwyane Dee, M.D.    Dg C-arm 1-60 Min  04/18/2012  *RADIOLOGY REPORT*  Clinical Data: Transphenoidal excision of pituitary tumor  DG C-ARM 1-60 MIN,SKULL - 1-3 VIEW  Comparison:  None.  Findings:  Two spot intraoperative radiographic images of the  skull are provided for review.  Images demonstrate support apparatus overlying the expected location of the maxilla.  Endotracheal and enteric tube tips are excluded from view.  Cranial stabilization devices overlie the skull.  Image labeled #1 demonstrates a radiopaque surgical instrument with distal ring apparatus overlying the expected location of the sella.  Imaged labeled #2 demonstrates a linear radiopaque surgical instrument with tip overlying the expected location of the sella.  IMPRESSION: Intraoperative radiographic images of the skull as above.   Original Report Authenticated By: Tacey Ruiz, MD     Cardiac Studies:  ECG:  Baseline ECG NSR normal  11/14 long R-P tachycardia   Telemetry: NSR rate 60-70 04/20/2012   Echo:  Reviewed EF 60-65% normal  Medications:     . atorvastatin  20 mg Oral q1800  .  ceFAZolin (ANCEF) IV  2 g Intravenous Q8H  . dexamethasone  4 mg Oral Q6H  . dexamethasone  4 mg Oral Q8H  . dexamethasone  6 mg Oral Q6H  . docusate sodium  100 mg Oral BID  . famotidine  20 mg Oral BID  . fenofibrate  160 mg Oral Daily  . furosemide  40 mg Oral q morning - 10a  . [COMPLETED] hydrocortisone sod succinate (SOLU-CORTEF) injection  100 mg Intravenous Q6H  . [COMPLETED] influenza  inactive virus vaccine  0.5 mL Intramuscular Tomorrow-1000  . levetiracetam  500 mg Intravenous Q12H  . metoprolol tartrate  25 mg Oral BID  . mupirocin ointment   Nasal BID  . pantoprazole  40 mg Oral Daily  . PARoxetine  20 mg Oral QHS  . [COMPLETED] pneumococcal 23 valent vaccine  0.5 mL Intramuscular Tomorrow-1000  . [DISCONTINUED] dexamethasone  4 mg Intravenous Q6H  . [DISCONTINUED] dexamethasone  4 mg Intravenous Q8H  . [DISCONTINUED] dexamethasone  6 mg Intravenous Q6H  . [DISCONTINUED] pantoprazole (PROTONIX) IV  40 mg Intravenous QHS       . amiodarone (NEXTERONE PREMIX) 360 mg/200 mL dextrose 30.6 mg/hr (04/20/12 0700)  . lactated ringers 75 mL/hr at 04/20/12 0700  .  [DISCONTINUED] diltiazem (CARDIZEM) infusion Stopped (04/19/12 1551)    Assessment/Plan:  SVT:  Self limited  On PO beta blocker at home resume this.  D/C amiodarone OK to D/C F/U with primary Echo normal baseline ECG normal and TSH normal  No clinical symptoms previously Chol:  Continue statin Pituitary Tumor:  Successful surgery FU with neuro  Charlton Haws 04/20/2012, 8:26 AM

## 2012-04-20 NOTE — Progress Notes (Signed)
POD 2 AF VSS  Doing well with minimal complaints. Nasal packing removed with minimal bleeding. OK to discharge.  Follow up 1 week.

## 2012-04-21 DIAGNOSIS — I498 Other specified cardiac arrhythmias: Secondary | ICD-10-CM

## 2012-04-21 DIAGNOSIS — I471 Supraventricular tachycardia: Secondary | ICD-10-CM | POA: Diagnosis not present

## 2012-04-21 LAB — BASIC METABOLIC PANEL
CO2: 27 mEq/L (ref 19–32)
Calcium: 9 mg/dL (ref 8.4–10.5)
Chloride: 103 mEq/L (ref 96–112)
Glucose, Bld: 200 mg/dL — ABNORMAL HIGH (ref 70–99)
Potassium: 3.6 mEq/L (ref 3.5–5.1)
Sodium: 140 mEq/L (ref 135–145)

## 2012-04-21 MED ORDER — DEXAMETHASONE 4 MG PO TABS: 4.0000 mg | ORAL_TABLET | Freq: Two times a day (BID) | ORAL | Status: DC

## 2012-04-21 NOTE — Progress Notes (Signed)
   TELEMETRY: Reviewed telemetry pt in NSR Filed Vitals:   04/20/12 2200 04/21/12 0200 04/21/12 0600 04/21/12 0946  BP: 151/88 135/50 164/66 162/67  Pulse: 63 53 60 65  Temp: 97.8 F (36.6 C) 97.6 F (36.4 C) 97.5 F (36.4 C) 97.8 F (36.6 C)  TempSrc: Oral Oral Oral Oral  Resp: 18 18 18 18   Height:      Weight:      SpO2: 98% 98% 98% 98%    Intake/Output Summary (Last 24 hours) at 04/21/12 1143 Last data filed at 04/20/12 2231  Gross per 24 hour  Intake    360 ml  Output      0 ml  Net    360 ml    SUBJECTIVE Feels well- ready to go home.  LABS: Basic Metabolic Panel:  Basename 04/21/12 0750 04/20/12 0420  NA 140 138  K 3.6 3.8  CL 103 104  CO2 27 28  GLUCOSE 200* 166*  BUN 13 10  CREATININE 0.56 0.62  CALCIUM 9.0 9.1  MG -- --  PHOS -- --   Cardiac Enzymes:  Basename 04/20/12 0104 04/19/12 1916 04/19/12 1337  CKTOTAL 81 78 73  CKMB 2.6 2.5 2.5  CKMBINDEX -- -- --  TROPONINI <0.30 <0.30 <0.30   Thyroid Function Tests:  Basename 04/19/12 1700  TSH 0.791  T4TOTAL --  T3FREE --  THYROIDAB --    Radiology/Studies:   Dg Chest 2 View  04/18/2012  *RADIOLOGY REPORT*  Clinical Data: History of spinal stenosis  CHEST - 2 VIEW  Comparison: Chest x-ray of 08/30/2006  Findings: No active infiltrate or effusion is seen.  Mediastinal contours are stable.  The heart is mildly enlarged and stable. There are diffuse degenerative changes throughout the thoracic spine.  IMPRESSION: No active lung disease.  Stable chest x-ray.   Original Report Authenticated By: Dwyane Dee, M.D.      PHYSICAL EXAM General: Well developed, well nourished, in no acute distress. Neck:  JVD not elevated. Lungs: Clear bilaterally to auscultation without wheezes, rales, or rhonchi. Breathing is unlabored. Heart: RRR S1 S2 without murmurs, rubs, or gallops.  Neuro: Alert and oriented X 3. Moves all extremities spontaneously. Psych:  Responds to questions appropriately with a normal  affect.  ASSESSMENT AND PLAN: 1. SVT- no recurrence. Continue prior beta blocker. OK for discharge from our standpoint. Follow up with Primary care.  Active Problems:  * No active hospital problems. *     Signed, Peter [place] MD,FACC 04/21/2012 11:45 AM

## 2012-04-21 NOTE — Discharge Summary (Signed)
  BP 162/67  Pulse 65  Temp 97.8 F (36.6 C) (Oral)  Resp 18  Ht 5\' 6"  (1.676 m)  Wt 78.9 kg (173 lb 15.1 oz)  BMI 28.08 kg/m2  SpO2 98% Admission dx: Pituitary mass Discharge dx: Pituitary mass Procedures: Transsphenoidal pituitary mass resection Complications: none Discharge Status: alive and well Surgeon: Tressie Stalker Mrs. Myracle was admitted to the hospital and taken to the OR for a transsphenoidal pituitary mass resection. She had a cystic mass which was compressing the optic chiasm. Post op she reports improved vision. She has a normal neurologic exam at discharge. Her nasal packs have been removed, and she is doing well.

## 2012-04-21 NOTE — Plan of Care (Signed)
Problem: Consults Goal: Diagnosis - Craniotomy Pituitary tumor

## 2012-05-07 NOTE — Progress Notes (Signed)
Patient is scheduled for surgery on 05/16/12.  Need orders placed in EPIC.  Thank You.

## 2012-05-22 ENCOUNTER — Encounter (INDEPENDENT_AMBULATORY_CARE_PROVIDER_SITE_OTHER): Payer: Self-pay

## 2012-05-31 ENCOUNTER — Encounter (INDEPENDENT_AMBULATORY_CARE_PROVIDER_SITE_OTHER): Payer: Medicare Other | Admitting: Surgery

## 2012-06-08 ENCOUNTER — Encounter (HOSPITAL_COMMUNITY): Payer: Self-pay | Admitting: Pharmacy Technician

## 2012-06-14 NOTE — Progress Notes (Signed)
Dr. Daphine Deutscher,           Mitzie Na -surgery date 1/22  - is coming to [facility] Tuesday  1/14  For her preop visit.  Please enter her preop orders in Epic.                       Thanks

## 2012-06-19 ENCOUNTER — Inpatient Hospital Stay (HOSPITAL_COMMUNITY): Admission: RE | Admit: 2012-06-19 | Payer: Medicare Other | Source: Ambulatory Visit

## 2012-06-19 ENCOUNTER — Other Ambulatory Visit (INDEPENDENT_AMBULATORY_CARE_PROVIDER_SITE_OTHER): Payer: Self-pay | Admitting: Surgery

## 2012-06-20 ENCOUNTER — Encounter (HOSPITAL_COMMUNITY)
Admission: RE | Admit: 2012-06-20 | Discharge: 2012-06-20 | Disposition: A | Discharge status: Home / Self Care | Payer: Medicare Other | Source: Ambulatory Visit | Attending: Surgery | Admitting: Surgery

## 2012-06-20 ENCOUNTER — Ambulatory Visit (HOSPITAL_COMMUNITY)
Admission: RE | Admit: 2012-06-20 | Discharge: 2012-06-20 | Disposition: A | Discharge status: Home / Self Care | Payer: Medicare Other | Source: Ambulatory Visit | Attending: Surgery | Admitting: Surgery

## 2012-06-20 ENCOUNTER — Encounter (HOSPITAL_COMMUNITY): Payer: Self-pay | Admitting: Pharmacy Technician

## 2012-06-20 ENCOUNTER — Encounter (HOSPITAL_COMMUNITY): Payer: Self-pay

## 2012-06-20 ENCOUNTER — Telehealth (INDEPENDENT_AMBULATORY_CARE_PROVIDER_SITE_OTHER): Payer: Self-pay

## 2012-06-20 ENCOUNTER — Telehealth (INDEPENDENT_AMBULATORY_CARE_PROVIDER_SITE_OTHER): Payer: Self-pay | Admitting: General Surgery

## 2012-06-20 DIAGNOSIS — Z01818 Encounter for other preprocedural examination: Secondary | ICD-10-CM | POA: Insufficient documentation

## 2012-06-20 DIAGNOSIS — Z01812 Encounter for preprocedural laboratory examination: Secondary | ICD-10-CM | POA: Insufficient documentation

## 2012-06-20 HISTORY — DX: Cardiac arrhythmia, unspecified: I49.9

## 2012-06-20 HISTORY — DX: Other disorders of pituitary gland: E23.6

## 2012-06-20 LAB — BASIC METABOLIC PANEL
Chloride: 103 mEq/L (ref 96–112)
GFR calc Af Amer: >90 mL/min (ref >90)
GFR calc non Af Amer: >90 mL/min (ref >90)
Glucose, Bld: 88 mg/dL (ref 70–99)
Potassium: 4.5 mEq/L (ref 3.5–5.1)
Sodium: 139 mEq/L (ref 135–145)

## 2012-06-20 LAB — CBC
Hemoglobin: 12.8 g/dL (ref 12.0–15.0)
MCHC: 33.1 g/dL (ref 30.0–36.0)
RBC: 4.37 MIL/uL (ref 3.87–5.11)
WBC: 6.7 10*3/uL (ref 4.0–10.5)

## 2012-06-20 LAB — SURGICAL PCR SCREEN
MRSA, PCR: NEGATIVE
Staphylococcus aureus: NEGATIVE

## 2012-06-20 MED ORDER — CHLORHEXIDINE GLUCONATE 4 % EX LIQD: 1.0000 "application " | Freq: Once | CUTANEOUS | Status: DC

## 2012-06-20 NOTE — Telephone Encounter (Signed)
LMOM for pt to let her know that her Rx and directions for her pre-op bowel prep have been signed, and that they are at the front desk.  She may pick them up at any time during office hours

## 2012-06-20 NOTE — Pre-Procedure Instructions (Addendum)
PREOP CBC, BMET, CXR WERE DONE TODAY AT [facility] AS PER ANESTHESIOLOGIST'S GUIDELINES AND T/S WILL BE DONE DAY OF SURGERY. PT HAS EKG 01/2012. PT'S PREOP B/P TODAY 163/89 -- PT STATES SHE STOPPED HER B/P MEDICATION AFTER HER CRANIOTOMY SURGERY NOV 2013--WITHOUT TALKING TO HER MEDICAL DOCTOR.  I ASKED PT TO CALL HER MEDICAL DOCTOR'S OFFICE - AND LET THEM KNOW ABOUT HER PLANNED SURGERY NEXT WEEK AND HER B/P READING TODAY AND THAT SHE HAS BEEN OFF HER B/P MEDICATION.  PT STATES SHE WILL GO BY HER DOCTOR'S OFFICE THIS AFTERNOON.

## 2012-06-20 NOTE — Telephone Encounter (Signed)
Received call from preop screening about bowel prep.  Per Dr. Daphine Deutscher, called in NuLytely, E-mycin and Neomycin to [facility].  Pt is aware.

## 2012-06-20 NOTE — Patient Instructions (Addendum)
YOUR SURGERY IS SCHEDULED AT [facility]  ON:  Wednesday  1/22  REPORT TO [facility]AY [facility] AT:  6:30 AM      PHONE # FOR SHORT STAY IS [phone]   FLEETS ENEMA NIGHT BEFORE YOUR SURGERY.   DO NOT EAT OR DRINK ANYTHING AFTER MIDNIGHT THE NIGHT BEFORE YOUR SURGERY.  YOU MAY BRUSH YOUR TEETH, RINSE OUT YOUR MOUTH--BUT NO WATER, NO FOOD, NO CHEWING GUM, NO MINTS, NO CANDIES, NO CHEWING TOBACCO.  PLEASE TAKE THE FOLLOWING MEDICATIONS THE AM OF YOUR SURGERY WITH A FEW SIPS OF WATER:  RANITIDINE    DO NOT BRING VALUABLES, MONEY, CREDIT CARDS.  DO NOT WEAR JEWELRY, MAKE-UP, NAIL POLISH AND NO METAL PINS OR CLIPS IN YOUR HAIR. CONTACT LENS, DENTURES / PARTIALS, GLASSES SHOULD NOT BE WORN TO SURGERY AND IN MOST CASES-HEARING AIDS WILL NEED TO BE REMOVED.  BRING YOUR GLASSES CASE, ANY EQUIPMENT NEEDED FOR YOUR CONTACT LENS. FOR PATIENTS ADMITTED TO THE HOSPITAL--CHECK OUT TIME THE DAY OF DISCHARGE IS 11:00 AM.  ALL INPATIENT ROOMS ARE PRIVATE - WITH BATHROOM, TELEPHONE, TELEVISION AND WIFI INTERNET.                             PLEASE READ OVER ANY  FACT SHEETS THAT YOU WERE GIVEN: MRSA INFORMATION, BLOOD TRANSFUSION INFORMATION. FAILURE TO FOLLOW THESE INSTRUCTIONS MAY RESULT IN THE CANCELLATION OF YOUR SURGERY.   PATIENT SIGNATURE_________________________________   PT CALLED THIS AM ( 06/25/12 ) TO SAY THAT SHE HAS STARTED BACK ON HER B/P MEDICATIONS--ATENOLOL AND LISINOPRIL.  I HAVE ADDED THOSE MEDS TO HER EPIC HOME MEDICATIONS AND INSTRUCTED PT TO TAKE HER ATENOLOL THE AM OF HER SURGERY.   PT ALSO STATES THE DRUG STORE TOLD HER THAT DR. MARTIN'S OFFICE HAS CALLED IN RX FOR LAXATIVE --I INSTRUCTED PT TO CALL DR. MARTIN'S OFFICE FOR INSTRUCTIONS ABOUT BOWEL PREP.  PT ALSO SAID SHE TAKES HER RANITIDINE AT BEDTIME- NOT IN AM--SO I INSTRUCTED HER TO TAKE RANITIDINE AT BEDTIME NIGHT BEFORE SURGERY INSTEAD OF THE MORNING OF SURGERY.  SHE VOICED UNDERSTANDING.

## 2012-06-25 ENCOUNTER — Telehealth (INDEPENDENT_AMBULATORY_CARE_PROVIDER_SITE_OTHER): Payer: Self-pay

## 2012-06-25 ENCOUNTER — Telehealth (INDEPENDENT_AMBULATORY_CARE_PROVIDER_SITE_OTHER): Payer: Self-pay | Admitting: General Surgery

## 2012-06-25 NOTE — Telephone Encounter (Signed)
Verified with pt that her prep and antibiotics are waiting to be p/u at [facility].  Went over all her instructions again.

## 2012-06-25 NOTE — Pre-Procedure Instructions (Signed)
PT CALLED THIS AM - STATES SHE DID NOT SEE HER MEDICAL DOCTOR--BUT DID DECIDE TO START BACK ON HER ATENOLOL AND HER LISINOPRIL  MEDICATIONS THAT SHE HAD STOPPED ON HER OWN BACK IN October 2013.  PT STATES SHE PICKED UP HER PRESCRIPTIONS SAT 06/23/12 AND STARTED THE MEDICATIONS Saturday.  PT ALSO STATES THAT THE PHARMACIST SAID DR. MARTIN'S OFFICE HAS CALLED IN PRESCRIPTION LAXATIVE SHE IS TO TAKE BEFORE SURGERY--BUT DR. MARTIN'S OFFICE HAS NOT CALLED HER WITH ANY INSTRUCTIONS.  PT INSTRUCTED TO CALL DR. MARTIN'S OFFICE - ASK TO SPEAK WITH HIS NURSE ABOUT HER BOWEL PREP AND ASK IF SHE IS TO STILL DO THE FLEETS ENEMA AS ORDERED.

## 2012-06-25 NOTE — Telephone Encounter (Signed)
Patient called in wanting to know if there was an additional prep required for her to take prior to surgery on 06/27/12. Patient stated she was told at hospital she only needed a fleets enema to prep for surgery. She is scheduled for a lap right hemi colectomy a [facility] by Dr. Daphine Deutscher. Advised patient that he will be here this afternoon and he should be able to call in the request to her pharmacy [facility]). Patient advised that she will be given prep instructions when called back to be advised that the prescription has been called in. Patient agreed.

## 2012-06-27 ENCOUNTER — Inpatient Hospital Stay (HOSPITAL_COMMUNITY): Payer: Medicare Other | Admitting: Anesthesiology

## 2012-06-27 ENCOUNTER — Encounter (HOSPITAL_COMMUNITY)
Admission: RE | Disposition: A | Discharge status: Home / Self Care | Payer: Self-pay | Source: Ambulatory Visit | Attending: Surgery

## 2012-06-27 ENCOUNTER — Encounter (HOSPITAL_COMMUNITY): Payer: Self-pay | Admitting: Anesthesiology

## 2012-06-27 ENCOUNTER — Encounter (HOSPITAL_COMMUNITY): Payer: Self-pay | Admitting: *Deleted

## 2012-06-27 ENCOUNTER — Inpatient Hospital Stay (HOSPITAL_COMMUNITY)
Admission: RE | Admit: 2012-06-27 | Discharge: 2012-07-01 | DRG: 331 | Disposition: A | Discharge status: Home / Self Care | Payer: Medicare Other | Source: Ambulatory Visit | Attending: Surgery | Admitting: Surgery

## 2012-06-27 DIAGNOSIS — K219 Gastro-esophageal reflux disease without esophagitis: Secondary | ICD-10-CM | POA: Diagnosis present

## 2012-06-27 DIAGNOSIS — D126 Benign neoplasm of colon, unspecified: Secondary | ICD-10-CM | POA: Diagnosis not present

## 2012-06-27 DIAGNOSIS — F329 Major depressive disorder, single episode, unspecified: Secondary | ICD-10-CM | POA: Diagnosis present

## 2012-06-27 DIAGNOSIS — D369 Benign neoplasm, unspecified site: Secondary | ICD-10-CM

## 2012-06-27 DIAGNOSIS — C189 Malignant neoplasm of colon, unspecified: Secondary | ICD-10-CM | POA: Diagnosis not present

## 2012-06-27 DIAGNOSIS — C184 Malignant neoplasm of transverse colon: Secondary | ICD-10-CM | POA: Diagnosis not present

## 2012-06-27 DIAGNOSIS — E7800 Pure hypercholesterolemia, unspecified: Secondary | ICD-10-CM | POA: Diagnosis not present

## 2012-06-27 DIAGNOSIS — I498 Other specified cardiac arrhythmias: Secondary | ICD-10-CM | POA: Diagnosis not present

## 2012-06-27 DIAGNOSIS — R0602 Shortness of breath: Secondary | ICD-10-CM | POA: Diagnosis not present

## 2012-06-27 DIAGNOSIS — I10 Essential (primary) hypertension: Secondary | ICD-10-CM | POA: Diagnosis not present

## 2012-06-27 DIAGNOSIS — F3289 Other specified depressive episodes: Secondary | ICD-10-CM | POA: Diagnosis present

## 2012-06-27 DIAGNOSIS — F325 Major depressive disorder, single episode, in full remission: Secondary | ICD-10-CM | POA: Diagnosis present

## 2012-06-27 DIAGNOSIS — E663 Overweight: Secondary | ICD-10-CM

## 2012-06-27 HISTORY — PX: LAPAROSCOPIC RIGHT HEMI COLECTOMY: SHX5926

## 2012-06-27 LAB — CBC
MCV: 89.6 fL (ref 78.0–100.0)
Platelets: 228 10*3/uL (ref 150–400)
RBC: 4.04 MIL/uL (ref 3.87–5.11)
WBC: 14.1 10*3/uL — ABNORMAL HIGH (ref 4.0–10.5)

## 2012-06-27 LAB — ABO/RH: ABO/RH(D): O POS

## 2012-06-27 LAB — CREATININE, SERUM: GFR calc Af Amer: >90 mL/min (ref >90)

## 2012-06-27 LAB — TYPE AND SCREEN: Antibody Screen: NEGATIVE

## 2012-06-27 SURGERY — LAPAROSCOPIC RIGHT HEMI COLECTOMY
Anesthesia: General | Site: Abdomen | Wound class: Clean Contaminated

## 2012-06-27 MED ORDER — ACETAMINOPHEN 10 MG/ML IV SOLN
INTRAVENOUS | Status: DC | PRN
  Administered 2012-06-27: 1000 mg via INTRAVENOUS

## 2012-06-27 MED ORDER — LACTATED RINGERS IV SOLN: INTRAVENOUS | Status: DC

## 2012-06-27 MED ORDER — PROPOFOL 10 MG/ML IV BOLUS
INTRAVENOUS | Status: DC | PRN
  Administered 2012-06-27: 160 mg via INTRAVENOUS

## 2012-06-27 MED ORDER — OXYCODONE-ACETAMINOPHEN 5-325 MG PO TABS: 1.0000 | ORAL_TABLET | ORAL | Status: DC | PRN

## 2012-06-27 MED ORDER — PROMETHAZINE HCL 25 MG/ML IJ SOLN: 6.2500 mg | INTRAMUSCULAR | Status: DC | PRN

## 2012-06-27 MED ORDER — DEXAMETHASONE SODIUM PHOSPHATE 10 MG/ML IJ SOLN
INTRAMUSCULAR | Status: DC | PRN
  Administered 2012-06-27: 10 mg via INTRAVENOUS

## 2012-06-27 MED ORDER — SODIUM CHLORIDE 0.9 % IV SOLN
1.0000 g | INTRAVENOUS | Status: AC
  Administered 2012-06-27: 1 g via INTRAVENOUS

## 2012-06-27 MED ORDER — ATENOLOL 100 MG PO TABS
100.0000 mg | ORAL_TABLET | Freq: Every day | ORAL | Status: DC
  Administered 2012-06-29 – 2012-07-01 (×3): 100 mg via ORAL
  Filled 2012-06-27 (×4): qty 1

## 2012-06-27 MED ORDER — GLYCOPYRROLATE 0.2 MG/ML IJ SOLN
INTRAMUSCULAR | Status: DC | PRN
  Administered 2012-06-27: 0.6 mg via INTRAVENOUS

## 2012-06-27 MED ORDER — ONDANSETRON HCL 4 MG/2ML IJ SOLN
INTRAMUSCULAR | Status: DC | PRN
  Administered 2012-06-27: 4 mg via INTRAVENOUS

## 2012-06-27 MED ORDER — HEPARIN SODIUM (PORCINE) 5000 UNIT/ML IJ SOLN
5000.0000 [IU] | Freq: Three times a day (TID) | INTRAMUSCULAR | Status: DC
  Administered 2012-06-27 – 2012-07-01 (×11): 5000 [IU] via SUBCUTANEOUS
  Filled 2012-06-27 (×14): qty 1

## 2012-06-27 MED ORDER — HEPARIN SODIUM (PORCINE) 5000 UNIT/ML IJ SOLN
5000.0000 [IU] | Freq: Three times a day (TID) | INTRAMUSCULAR | Status: DC
  Filled 2012-06-27 (×2): qty 1

## 2012-06-27 MED ORDER — LISINOPRIL 20 MG PO TABS
20.0000 mg | ORAL_TABLET | Freq: Every day | ORAL | Status: DC
  Administered 2012-06-27 – 2012-07-01 (×4): 20 mg via ORAL
  Filled 2012-06-27 (×5): qty 1

## 2012-06-27 MED ORDER — 0.9 % SODIUM CHLORIDE (POUR BTL) OPTIME
TOPICAL | Status: DC | PRN
  Administered 2012-06-27: 3000 mL

## 2012-06-27 MED ORDER — BUPIVACAINE LIPOSOME 1.3 % IJ SUSP
20.0000 mL | Freq: Once | INTRAMUSCULAR | Status: AC
  Administered 2012-06-27: 20 mL
  Filled 2012-06-27: qty 20

## 2012-06-27 MED ORDER — ACETAMINOPHEN 10 MG/ML IV SOLN
1000.0000 mg | Freq: Four times a day (QID) | INTRAVENOUS | Status: AC
  Administered 2012-06-27 – 2012-06-28 (×4): 1000 mg via INTRAVENOUS
  Filled 2012-06-27 (×6): qty 100

## 2012-06-27 MED ORDER — HYDROMORPHONE HCL PF 1 MG/ML IJ SOLN: 0.2500 mg | INTRAMUSCULAR | Status: DC | PRN

## 2012-06-27 MED ORDER — LACTATED RINGERS IV SOLN
INTRAVENOUS | Status: DC | PRN
  Administered 2012-06-27 (×4): via INTRAVENOUS

## 2012-06-27 MED ORDER — HYDROMORPHONE HCL PF 1 MG/ML IJ SOLN
INTRAMUSCULAR | Status: DC | PRN
  Administered 2012-06-27 (×4): 0.5 mg via INTRAVENOUS

## 2012-06-27 MED ORDER — MEPERIDINE HCL 50 MG/ML IJ SOLN: 6.2500 mg | INTRAMUSCULAR | Status: DC | PRN

## 2012-06-27 MED ORDER — LIDOCAINE HCL (CARDIAC) 20 MG/ML IV SOLN
INTRAVENOUS | Status: DC | PRN
  Administered 2012-06-27: 50 mg via INTRAVENOUS

## 2012-06-27 MED ORDER — KCL IN DEXTROSE-NACL 20-5-0.45 MEQ/L-%-% IV SOLN
INTRAVENOUS | Status: DC
  Administered 2012-06-27 – 2012-06-28 (×3): via INTRAVENOUS
  Administered 2012-06-29: 1000 mL via INTRAVENOUS
  Administered 2012-06-29: 05:00:00 via INTRAVENOUS
  Administered 2012-06-30: 100 mL/h via INTRAVENOUS
  Filled 2012-06-27 (×8): qty 1000

## 2012-06-27 MED ORDER — MIDAZOLAM HCL 5 MG/5ML IJ SOLN
INTRAMUSCULAR | Status: DC | PRN
  Administered 2012-06-27: 2 mg via INTRAVENOUS

## 2012-06-27 MED ORDER — HEPARIN SODIUM (PORCINE) 5000 UNIT/ML IJ SOLN
5000.0000 [IU] | Freq: Once | INTRAMUSCULAR | Status: AC
  Administered 2012-06-27: 5000 [IU] via SUBCUTANEOUS
  Filled 2012-06-27: qty 1

## 2012-06-27 MED ORDER — FENTANYL CITRATE 0.05 MG/ML IJ SOLN
INTRAMUSCULAR | Status: DC | PRN
  Administered 2012-06-27: 100 ug via INTRAVENOUS

## 2012-06-27 MED ORDER — NEOSTIGMINE METHYLSULFATE 1 MG/ML IJ SOLN
INTRAMUSCULAR | Status: DC | PRN
  Administered 2012-06-27: 4 mg via INTRAVENOUS

## 2012-06-27 MED ORDER — ROCURONIUM BROMIDE 100 MG/10ML IV SOLN
INTRAVENOUS | Status: DC | PRN
  Administered 2012-06-27: 50 mg via INTRAVENOUS
  Administered 2012-06-27 (×3): 10 mg via INTRAVENOUS

## 2012-06-27 MED ORDER — LACTATED RINGERS IR SOLN
Status: DC | PRN
  Administered 2012-06-27: 3000 mL

## 2012-06-27 MED ORDER — EPHEDRINE SULFATE 50 MG/ML IJ SOLN
INTRAMUSCULAR | Status: DC | PRN
  Administered 2012-06-27: 10 mg via INTRAVENOUS
  Administered 2012-06-27: 5 mg via INTRAVENOUS

## 2012-06-27 MED ORDER — HYDROMORPHONE HCL PF 1 MG/ML IJ SOLN: 0.5000 mg | INTRAMUSCULAR | Status: DC | PRN

## 2012-06-27 SURGICAL SUPPLY — 77 items
APPLIER CLIP 5 13 M/L LIGAMAX5 (MISCELLANEOUS)
APPLIER CLIP ROT 10 11.4 M/L (STAPLE)
BLADE EXTENDED COATED 6.5IN (ELECTRODE) IMPLANT
BLADE HEX COATED 2.75 (ELECTRODE) ×2 IMPLANT
BLADE SURG SZ10 CARB STEEL (BLADE) ×2 IMPLANT
CABLE HIGH FREQUENCY MONO STRZ (ELECTRODE) ×2 IMPLANT
CANISTER SUCTION 2500CC (MISCELLANEOUS) ×2 IMPLANT
CELLS DAT CNTRL 66122 CELL SVR (MISCELLANEOUS) ×1 IMPLANT
CLIP APPLIE 5 13 M/L LIGAMAX5 (MISCELLANEOUS) IMPLANT
CLIP APPLIE ROT 10 11.4 M/L (STAPLE) IMPLANT
CLOTH BEACON ORANGE TIMEOUT ST (SAFETY) ×2 IMPLANT
COVER MAYO STAND STRL (DRAPES) ×2 IMPLANT
DECANTER SPIKE VIAL GLASS SM (MISCELLANEOUS) IMPLANT
DRAIN CHANNEL 19F RND (DRAIN) IMPLANT
DRAPE LAPAROSCOPIC ABDOMINAL (DRAPES) ×2 IMPLANT
DRAPE LG THREE QUARTER DISP (DRAPES) IMPLANT
DRAPE WARM FLUID 44X44 (DRAPE) ×2 IMPLANT
DRSG TEGADERM 2-3/8X2-3/4 SM (GAUZE/BANDAGES/DRESSINGS) ×6 IMPLANT
ELECT REM PT RETURN 9FT ADLT (ELECTROSURGICAL) ×2
ELECTRODE REM PT RTRN 9FT ADLT (ELECTROSURGICAL) ×1 IMPLANT
GAUZE SPONGE 2X2 8PLY STRL LF (GAUZE/BANDAGES/DRESSINGS) ×1 IMPLANT
GLOVE BIOGEL M 8.0 STRL (GLOVE) ×4 IMPLANT
GLOVE BIOGEL PI IND STRL 7.0 (GLOVE) ×1 IMPLANT
GLOVE BIOGEL PI INDICATOR 7.0 (GLOVE) ×1
GOWN STRL NON-REIN LRG LVL3 (GOWN DISPOSABLE) IMPLANT
GOWN STRL REIN XL XLG (GOWN DISPOSABLE) ×14 IMPLANT
GRASPER LAPSCPC 5X35 EPIX (ENDOMECHANICALS) IMPLANT
HAND ACTIVATED (MISCELLANEOUS) IMPLANT
KIT BASIN OR (CUSTOM PROCEDURE TRAY) ×2 IMPLANT
LEGGING LITHOTOMY PAIR STRL (DRAPES) IMPLANT
LIGASURE IMPACT 36 18CM CVD LR (INSTRUMENTS) IMPLANT
NS IRRIG 1000ML POUR BTL (IV SOLUTION) ×4 IMPLANT
PENCIL BUTTON HOLSTER BLD 10FT (ELECTRODE) ×2 IMPLANT
RELOAD PROXIMATE 75MM BLUE (ENDOMECHANICALS) ×6 IMPLANT
RTRCTR WOUND ALEXIS 18CM MED (MISCELLANEOUS) ×2
SCISSORS LAP 5X35 DISP (ENDOMECHANICALS) ×2 IMPLANT
SEALER TISSUE G2 CVD JAW 35 (ENDOMECHANICALS) IMPLANT
SEALER TISSUE G2 CVD JAW 45CM (ENDOMECHANICALS)
SET IRRIG TUBING LAPAROSCOPIC (IRRIGATION / IRRIGATOR) ×2 IMPLANT
SHEARS CURVED HARMONIC AC 45CM (MISCELLANEOUS) ×2 IMPLANT
SLEEVE SURGEON STRL (DRAPES) ×2 IMPLANT
SOLUTION ANTI FOG 6CC (MISCELLANEOUS) ×2 IMPLANT
SPONGE GAUZE 2X2 STER 10/PKG (GAUZE/BANDAGES/DRESSINGS) ×1
SPONGE GAUZE 4X4 12PLY (GAUZE/BANDAGES/DRESSINGS) IMPLANT
SPONGE LAP 18X18 X RAY DECT (DISPOSABLE) ×4 IMPLANT
STAPLER PROXIMATE 75MM BLUE (STAPLE) ×2 IMPLANT
STAPLER VISISTAT 35W (STAPLE) ×2 IMPLANT
SUCTION POOLE TIP (SUCTIONS) ×2 IMPLANT
SUT NOVA NAB DX-16 0-1 5-0 T12 (SUTURE) ×4 IMPLANT
SUT PDS AB 1 CTX 36 (SUTURE) ×2 IMPLANT
SUT PDS AB 1 TP1 96 (SUTURE) IMPLANT
SUT PDS AB 4-0 SH 27 (SUTURE) ×4 IMPLANT
SUT PROLENE 2 0 KS (SUTURE) IMPLANT
SUT SILK 2 0 (SUTURE)
SUT SILK 2 0 SH CR/8 (SUTURE) ×2 IMPLANT
SUT SILK 2-0 18XBRD TIE 12 (SUTURE) IMPLANT
SUT SILK 3 0 (SUTURE)
SUT SILK 3 0 SH CR/8 (SUTURE) ×6 IMPLANT
SUT SILK 3-0 18XBRD TIE 12 (SUTURE) IMPLANT
SUT VIC AB 2-0 SH 18 (SUTURE) IMPLANT
SUT VIC AB 3-0 SH 18 (SUTURE) ×2 IMPLANT
SUT VICRYL 2 0 18  UND BR (SUTURE)
SUT VICRYL 2 0 18 UND BR (SUTURE) IMPLANT
SYR 30ML LL (SYRINGE) IMPLANT
SYS LAPSCP GELPORT 120MM (MISCELLANEOUS)
SYSTEM LAPSCP GELPORT 120MM (MISCELLANEOUS) IMPLANT
TOWEL OR 17X26 10 PK STRL BLUE (TOWEL DISPOSABLE) ×4 IMPLANT
TRAY FOLEY CATH 14FRSI W/METER (CATHETERS) ×2 IMPLANT
TRAY LAP CHOLE (CUSTOM PROCEDURE TRAY) ×2 IMPLANT
TROCAR BLADELESS OPT 5 75 (ENDOMECHANICALS) ×2 IMPLANT
TROCAR SLEEVE XCEL 5X75 (ENDOMECHANICALS) ×6 IMPLANT
TROCAR XCEL BLUNT TIP 100MML (ENDOMECHANICALS) IMPLANT
TROCAR XCEL NON-BLD 11X100MML (ENDOMECHANICALS) IMPLANT
TROCAR XCEL NON-BLD 5MMX100MML (ENDOMECHANICALS) IMPLANT
TUBING FILTER THERMOFLATOR (ELECTROSURGICAL) ×2 IMPLANT
YANKAUER SUCT BULB TIP 10FT TU (MISCELLANEOUS) ×2 IMPLANT
YANKAUER SUCT BULB TIP NO VENT (SUCTIONS) ×2 IMPLANT

## 2012-06-27 NOTE — Interval H&P Note (Signed)
History and Physical Interval Note:  06/27/2012 8:33 AM  Kristen [name]  has presented today for surgery, with the diagnosis of recurrent colon polyps  The various methods of treatment have been discussed with the patient and family. After consideration of risks, benefits and other options for treatment, the patient has consented to  Procedure(s) (LRB) with comments: LAPAROSCOPIC RIGHT HEMI COLECTOMY (N/A) - Laparoscopic Assited Right Hemicolectomy as a surgical intervention .  The patient's history has been reviewed, patient examined, no change in status, stable for surgery.  I have reviewed the patient's chart and labs.  Questions were answered to the patient's satisfaction.     Darion Milewski B

## 2012-06-27 NOTE — Anesthesia Postprocedure Evaluation (Signed)
  Anesthesia Post-op Note  Patient: Kristen [name]  Procedure(s) Performed: Procedure(s) (LRB): LAPAROSCOPIC RIGHT HEMI COLECTOMY (N/A)  Patient Location: PACU  Anesthesia Type: General  Level of Consciousness: awake and alert   Airway and Oxygen Therapy: Patient Spontanous Breathing  Post-op Pain: mild  Post-op Assessment: Post-op Vital signs reviewed, Patient's Cardiovascular Status Stable, Respiratory Function Stable, Patent Airway and No signs of Nausea or vomiting  Last Vitals:  Filed Vitals:   06/27/12 1230  BP: 133/72  Pulse: 59  Temp:   Resp: 17    Post-op Vital Signs: stable   Complications: No apparent anesthesia complications

## 2012-06-27 NOTE — Anesthesia Preprocedure Evaluation (Addendum)
Anesthesia Evaluation  Patient identified by MRN, date of birth, ID band Patient awake    Reviewed: Allergy & Precautions, H&P , NPO status , Patient's Chart, lab work & pertinent test results  History of Anesthesia Complications (+) PONV  Airway Mallampati: II TM Distance: >3 FB Neck ROM: Full    Dental No notable dental hx.    Pulmonary  breath sounds clear to auscultation  Pulmonary exam normal       Cardiovascular hypertension, Pt. on medications + dysrhythmias Supra Ventricular Tachycardia Rhythm:Regular Rate:Normal     Neuro/Psych negative neurological ROS  negative psych ROS   GI/Hepatic Neg liver ROS, GERD-  Medicated and Controlled,  Endo/Other  negative endocrine ROS  Renal/GU negative Renal ROS  negative genitourinary   Musculoskeletal negative musculoskeletal ROS (+)   Abdominal   Peds negative pediatric ROS (+)  Hematology negative hematology ROS (+)   Anesthesia Other Findings Upper front caps  Reproductive/Obstetrics negative OB ROS                          Anesthesia Physical Anesthesia Plan  ASA: II  Anesthesia Plan: General   Post-op Pain Management:    Induction: Intravenous  Airway Management Planned: Oral ETT  Additional Equipment:   Intra-op Plan:   Post-operative Plan: Extubation in OR  Informed Consent: I have reviewed the patients History and Physical, chart, labs and discussed the procedure including the risks, benefits and alternatives for the proposed anesthesia with the patient or authorized representative who has indicated his/her understanding and acceptance.   Dental advisory given  Plan Discussed with: CRNA  Anesthesia Plan Comments:         Anesthesia Quick Evaluation

## 2012-06-27 NOTE — Op Note (Signed)
Surgeon: Wenda Low, MD, FACS  Asst:  Jaclynn Guarneri, MD,FACS  Anes:  general  Procedure: Lap assisted redo right colectomy resection a polyp at the anastomosis and a tatooed area in the distal transverse colon  Diagnosis: Adenomatous polyps  Complications: none  EBL:   25 cc  Description of Procedure:  The patient was taken to room 1 and given general anesthesia. The abdomen was prepped with PCMX and draped sterilely. Timeout was performed and access was achieved to the left upper quadrant with a 0 5 mm Optiview without difficulty. 3 other trocar sites were placed. Adhesions from her previous transverse incision where a previous descending colectomy had been done were taken down with sharp dissection. The previous resected area of a lot of adhesions particularly in the right lower quadrant these were taken down with sharp dissection and went up into the right upper quadrant to the Roux the remaining portion of the anastomosis: Small bowel away from adhesions to the liver gallbladder and stomach. Once this was been done we felt we had this mobilized well I used her old transverse incision opening at about 9 cm and inserting the wound protector. We will more mobilization proximal distal regions were able to be brought up in the wound.  Telephone conversation with Dr. Madilyn Fireman to clarify the significance of the tattooed region and. We would need to extend our resection will further. We could palpate the recurrent polyp at the anastomosis. I divided the colon and small bowel initially and resected that before extending the colon resection distally beyond 2 tattooed areas. These 2 specimens worse since separately as they were resected separately and then an anastomosis created.  A functional end-to-end anastomosis was created about aligning the antimesenteric borders of the terminal ileum along the tenia of the transverse colon. Sodomy's were made in both the bowel and the GIA was inserted and fired the  common channel was created. The opening it remained was closed in 2 layers from either end with an inner layer of 40 DDS in our layer of 3-0 silk. Wound was irrigated with saline. Gloves were changed up which was used closure. Posterior sheath peritoneum was closed with running #1 PDS. The anterior sheath and scar layer was closed with interrupted #1 Novafils. Fascial checked with Exparel and the wounds were again once again irrigated and closed with staples. Patient tolerated procedure well was taken recovery room in satisfactory condition. The 2 polypoid lesions or deferred for permanent pathologic sections.  Matt B. Daphine Deutscher, MD, [facility], [place] [phone]

## 2012-06-27 NOTE — H&P (Signed)
Chief Complaint: recurrent adenomatous polyps at ileocolostomy  History of Present Illness:  Kristen [name] is an 72 y.o. 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 Mardella Layman presents for repeat partial colectomy for same. She underwent a colonoscopy by Dr. Madilyn Fireman who found a recurrent adenomatous polyp at her anastomosis and another in the proximal transverse colon which he biopsied and tattooed. She has been through this procedure before and we discussed laparoscopically-assisted partial colectomy. She wanted to move forward with resection as soon as possible but surgery was delayed for pituitary surgery. . We will do an oral bowel prep and do her surgery at [facility].    Past Medical History  Diagnosis Date  . Shortness of breath     exertion  . Pneumonia     hx of pneumonia  . GERD (gastroesophageal reflux disease)   . Headache     hx migraines  . Depression   . Colon polyps   . Arthritis   . Hypertension     pt states she has not taken any b/p medication since her brain surgery in oct 3013--took herself off the medication  . Dysrhythmia 04/2012    SVT - RATE 145 POST OP CRANIOTOMY SURGERY --TX'D AND RESOLVED--PT STATES NO FURTHER PROBLEM THAT SHE IS AWARE OF -SHE HAS NOT HAD TO SEE CARDIOLOGIST  . Pituitary mass NOV 2013    MASS FOUND AFTER PT EXPERIENCED BILATERAL BLURRED / HAZY VISION -ESP LEFT EYE.  S/P CRANIOTOMY AND EYESIGHT RETURNED TO NORMAL--PT STILL HAS A LOT OF TENDERNESS RIGHT SIDE OF NOSE --THE CRANIOTOMY WAS DONE BY TRANSNASAL APPROACH  . PONV (postoperative nausea and vomiting)     PT STATES PLEASE NOTE SHE HAS TENDERNESS RIGHT SIDE OF NOSE - SINCE TRANSNASAL CRANIOTOMY 04/2012    Past Surgical History  Procedure Date  . Colon resection 1988  . Colonoscopy 09/20/2011    Procedure: COLONOSCOPY;  Surgeon: Barrie Folk, MD;   Location: [facility] ENDOSCOPY;  Service: Endoscopy;  Laterality: N/A;  . Hot hemostasis 09/20/2011    Procedure: HOT HEMOSTASIS (ARGON PLASMA COAGULATION/BICAP);  Surgeon: Barrie Folk, MD;  Location: [facility]OSCOPY;  Service: Endoscopy;  Laterality: N/A;  . Cataract extraction June/july 2013    bilateral eyes  . Colonoscopy 12/15/2011    Procedure: COLONOSCOPY;  Surgeon: Barrie Folk, MD;  Location: [facility] ENDOSCOPY;  Service: Endoscopy;  Laterality: N/A;  . Colonoscopy 01/26/2012    Procedure: COLONOSCOPY;  Surgeon: Florencia Reasons, MD;  Location: [facility] ENDOSCOPY;  Service: Endoscopy;  Laterality: N/A;  . Colon surgery   . Eye surgery     cataract bil  . Tumor removal     Left leg  . Back surgery   . Craniotomy 04/18/2012    Procedure: CRANIOTOMY HYPOPHYSECTOMY TRANSNASAL APPROACH;  Surgeon: Cristi Loron, MD;  Location: [facility] NEURO ORS;  Service: Neurosurgery;  Laterality: Bilateral;  Transphenoidal resection of Pituitary tumor.   . Transnasal approach 04/18/2012    Procedure: TRANSNASAL APPROACH;  Surgeon: Drema Halon, MD;  Location: [facility] NEUR[facility] ORS;  Service: ENT;  Laterality: Bilateral;  Transnasal approach    Current Facility-Administered Medications  Medication Dose Route Frequency Provider Last Rate Last Dose  . ertapenem (INVANZ) 1 g in sodium chloride 0.9 % 50 mL IVPB  1 g Intravenous On Call to OR Valarie Merino, MD      .  heparin injection 5,000 Units  5,000 Units Subcutaneous Once Valarie Merino, MD       Codeine and Fenofibrate Family History  Problem Relation Age of Onset  . Colon cancer Brother   . Colon cancer Sister   . Heart disease Mother   . Heart disease Father    Social History:   reports that she has never smoked. She does not have any smokeless tobacco history on file. She reports that she does not drink alcohol or use illicit drugs.   REVIEW OF SYSTEMS - PERTINENT POSITIVES ONLY: noncontributory  Physical Exam:   Blood pressure 149/44, pulse 51,  temperature 97.5 F (36.4 C), resp. rate 20, SpO2 99.00%. There is no height or weight on file to calculate BMI.  Gen:  WDWN F NAD  Neurological: Alert and oriented to person, place, and time. Motor and sensory function is grossly intact  Head: Normocephalic and atraumatic.  Eyes: Conjunctivae are normal. Pupils are equal, round, and reactive to light. No scleral icterus.  Neck: Normal range of motion. Neck supple. No tracheal deviation or thyromegaly present.  Cardiovascular:  SR without murmurs or gallops.  No carotid bruits Respiratory: Effort normal.  No respiratory distress. No chest wall tenderness. Breath sounds normal.  No wheezes, rales or rhonchi.  Abdomen: well healed scar from prior abdominal surgery GU: Musculoskeletal: Normal range of motion. Extremities are nontender. No cyanosis, edema or clubbing noted Lymphadenopathy: No cervical, preauricular, postauricular or axillary adenopathy is present Skin: Skin is warm and dry. No rash noted. No diaphoresis. No erythema. No pallor. Pscyh: Normal mood and affect. Behavior is normal. Judgment and thought content normal.   LABORATORY RESULTS: No results found for this or any previous visit (from the past 48 hour(s)).  RADIOLOGY RESULTS: No results found.  Problem List: Patient Active Problem List  Diagnosis  . Hypertension  . Hypercholesterolemia  . Adenomatous polyps-recurrent  . SVT (supraventricular tachycardia)    Assessment & Plan: Adenomatous polyps of the ileocolostomy    Matt B. Daphine Deutscher, MD, [facility] [phone] beeper [phone]  06/27/2012 6:32 AM

## 2012-06-27 NOTE — Transfer of Care (Signed)
Immediate Anesthesia Transfer of Care Note  Patient: Kristen [name]  Procedure(s) Performed: Procedure(s) (LRB) with comments: LAPAROSCOPIC RIGHT HEMI COLECTOMY (N/A) - Laparoscopic Assited Right Hemicolectomy  Patient Location: PACU  Anesthesia Type:General  Level of Consciousness: awake, alert  and oriented  Airway & Oxygen Therapy: Patient Spontanous Breathing and Patient connected to face mask oxygen  Post-op Assessment: Report given to PACU RN and Post -op Vital signs reviewed and stable  Post vital signs: Reviewed and stable  Complications: No apparent anesthesia complications

## 2012-06-27 NOTE — Preoperative (Signed)
Beta Blockers   Reason not to administer Beta Blockers:Not Applicable 

## 2012-06-28 ENCOUNTER — Encounter (HOSPITAL_COMMUNITY): Payer: Self-pay | Admitting: Surgery

## 2012-06-28 LAB — CBC
HCT: 32 % — ABNORMAL LOW (ref 36.0–46.0)
Platelets: 209 10*3/uL (ref 150–400)
RDW: 13.1 % (ref 11.5–15.5)
WBC: 11 10*3/uL — ABNORMAL HIGH (ref 4.0–10.5)

## 2012-06-28 LAB — BASIC METABOLIC PANEL
BUN: 14 mg/dL (ref 6–23)
Chloride: 105 mEq/L (ref 96–112)
GFR calc Af Amer: >90 mL/min (ref >90)
Potassium: 4.4 mEq/L (ref 3.5–5.1)
Sodium: 138 mEq/L (ref 135–145)

## 2012-06-28 NOTE — Progress Notes (Signed)
INITIAL NUTRITION ASSESSMENT  DOCUMENTATION CODES Per approved criteria  -Not Applicable   INTERVENTION: - Diet advancement per MD - Will continue to monitor   NUTRITION DIAGNOSIS: Inadequate oral intake related to inability to eat as evidenced by NPO.   Goal: Advance diet as tolerated to regular diet  Monitor:  Weights, labs, diet advancement, BM  Reason for Assessment: Nutrition risk   72 y.o. female  Admitting Dx: Recurrent adenomatous polyps at ileocolostomy  ASSESSMENT: Pt reports 30 pound intentional weight loss since November 2013 from watching her portions, eating more vegetables and salmon, and cutting out junk food. Pt reports she typically eats 2 healthy meals/day at home. POD# 1 right hemi-colectomy. Pt without flatus, currently NPO.   Height: Ht Readings from Last 1 Encounters:  06/27/12 5\' 6"  (1.676 m)    Weight: Wt Readings from Last 1 Encounters:  06/27/12 167 lb (75.751 kg)    Ideal Body Weight: 130 lb  % Ideal Body Weight: 128  Wt Readings from Last 10 Encounters:  06/27/12 167 lb (75.751 kg)  06/27/12 167 lb (75.751 kg)  06/20/12 177 lb (80.287 kg)  04/20/12 173 lb 15.1 oz (78.9 kg)  04/20/12 173 lb 15.1 oz (78.9 kg)  02/03/12 172 lb (78.019 kg)  12/15/11 180 lb (81.647 kg)  12/15/11 180 lb (81.647 kg)  09/20/11 187 lb (84.823 kg)  09/20/11 187 lb (84.823 kg)    Usual Body Weight: 197 lb  % Usual Body Weight: 85  BMI:  Body mass index is 26.95 kg/(m^2).  Estimated Nutritional Needs: Kcal: 1500-1900 Protein: 75-90g Fluid: 1.5-1.9L/day   Diet Order: NPO  EDUCATION NEEDS: -No education needs identified at this time   Intake/Output Summary (Last 24 hours) at 06/28/12 1042 Last data filed at 06/28/12 1000  Gross per 24 hour  Intake 2258.33 ml  Output   2050 ml  Net 208.33 ml    Last BM: 1/21   Labs:   Lab 06/28/12 0410 06/27/12 1721  NA 138 --  K 4.4 --  CL 105 --  CO2 27 --  BUN 14 --  CREATININE 0.58 0.61    CALCIUM 8.4 --  MG -- --  PHOS -- --  GLUCOSE 179* --    CBG (last 3)  No results found for this basename: GLUCAP:3 in the last 72 hours  Scheduled Meds:   . acetaminophen  1,000 mg Intravenous Q6H  . atenolol  100 mg Oral Daily  . heparin  5,000 Units Subcutaneous Q8H  . lisinopril  20 mg Oral Daily    Continuous Infusions:   . dextrose 5 % and 0.45 % NaCl with KCl 20 mEq/L 100 mL/hr at 06/28/12 4782    Past Medical History  Diagnosis Date  . Shortness of breath     exertion  . Pneumonia     hx of pneumonia  . GERD (gastroesophageal reflux disease)   . Headache     hx migraines  . Depression   . Colon polyps   . Arthritis   . Hypertension     pt states she has not taken any b/p medication since her brain surgery in oct 3013--took herself off the medication  . Dysrhythmia 04/2012    SVT - RATE 145 POST OP CRANIOTOMY SURGERY --TX'D AND RESOLVED--PT STATES NO FURTHER PROBLEM THAT SHE IS AWARE OF -SHE HAS NOT HAD TO SEE CARDIOLOGIST  . Pituitary mass NOV 2013    MASS FOUND AFTER PT EXPERIENCED BILATERAL BLURRED / HAZY VISION -ESP LEFT EYE.  S/P CRANIOTOMY AND EYESIGHT RETURNED TO NORMAL--PT STILL HAS A LOT OF TENDERNESS RIGHT SIDE OF NOSE --THE CRANIOTOMY WAS DONE BY TRANSNASAL APPROACH  . PONV (postoperative nausea and vomiting)     PT STATES PLEASE NOTE SHE HAS TENDERNESS RIGHT SIDE OF NOSE - SINCE TRANSNASAL CRANIOTOMY 04/2012    Past Surgical History  Procedure Date  . Colon resection 1988  . Colonoscopy 09/20/2011    Procedure: COLONOSCOPY;  Surgeon: Barrie Folk, MD;  Location: [facility] ENDOSCOPY;  Service: Endoscopy;  Laterality: N/A;  . Hot hemostasis 09/20/2011    Procedure: HOT HEMOSTASIS (ARGON PLASMA COAGULATION/BICAP);  Surgeon: Barrie Folk, MD;  Location: [facility] ENDOSCOPY;  Service: Endoscopy;  Laterality: N/A;  . Cataract extraction June/july 2013    bilateral eyes  . Colonoscopy 12/15/2011    Procedure: COLONOSCOPY;  Surgeon: Barrie Folk, MD;  Location: [facility]  ENDOSCOPY;  Service: Endoscopy;  Laterality: N/A;  . Colonoscopy 01/26/2012    Procedure: COLONOSCOPY;  Surgeon: Florencia Reasons, MD;  Location: [facility] ENDOSCOPY;  Service: Endoscopy;  Laterality: N/A;  . Colon surgery   . Eye surgery     cataract bil  . Tumor removal     Left leg  . Back surgery   . Craniotomy 04/18/2012    Procedure: CRANIOTOMY HYPOPHYSECTOMY TRANSNASAL APPROACH;  Surgeon: Cristi Loron, MD;  Location: [facility] NEURO ORS;  Service: Neurosurgery;  Laterality: Bilateral;  Transphenoidal resection of Pituitary tumor.   . Transnasal approach 04/18/2012    Procedure: TRANSNASAL APPROACH;  Surgeon: Drema Halon, MD;  Location: [facility] NEURO ORS;  Service: ENT;  Laterality: Bilateral;  Transnasal approach    Levon Hedger MS, RD, LDN [phone] Pager [phone] After Hours Pager

## 2012-06-28 NOTE — Care Management Note (Signed)
    Page 1 of 1   06/28/2012     11:32:55 AM   CARE MANAGEMENT NOTE 06/28/2012  Patient:  Kristen [name], Kristen [name]   Account Number:  [id number]  Date Initiated:  06/28/2012  Documentation initiated by:  Lorenda Ishihara  Subjective/Objective Assessment:   72 yo female admitted s/p lap colectomy. PTA lived at home with family.     Action/Plan:   Home when stable   Anticipated DC Date:  07/02/2012   Anticipated DC Plan:  HOME/SELF CARE      DC Planning Services  CM consult      Choice offered to / List presented to:             Status of service:  Completed, signed off Medicare Important Message given?   (If response is "NO", the following Medicare IM given date fields will be blank) Date Medicare IM given:   Date Additional Medicare IM given:    Discharge Disposition:  HOME/SELF CARE  Per UR Regulation:  Reviewed for med. necessity/level of care/duration of stay  If discussed at Long Length of Stay Meetings, dates discussed:    Comments:

## 2012-06-28 NOTE — Progress Notes (Signed)
Patient ID: Kristen [name], female   DOB: [birth year], 72 y.o.   MRN: [id number] [facility] Progress Note:   1 Day Post-Op  Subjective: Mental status is clear.  Minimal to no pain Objective: Vital signs in last 24 hours: Temp:  [97.4 F (36.3 C)-98.2 F (36.8 C)] 98.2 F (36.8 C) (01/23 0522) Pulse Rate:  [48-60] 50  (01/23 0522) Resp:  [8-17] 16  (01/23 0522) BP: (80-156)/(46-76) 107/63 mmHg (01/23 0522) SpO2:  [92 %-100 %] 98 % (01/23 0522) Weight:  [167 lb (75.751 kg)] 167 lb (75.751 kg) (01/22 1639)  Intake/Output from previous day: 01/22 0701 - 01/23 0700 In: 4258.3 [I.V.:4258.3] Out: 1300 [Urine:1100; Blood:200] Intake/Output this shift:    Physical Exam: Work of breathing is normal.  Minimal pain.  Lab Results:  Results for orders placed during the hospital encounter of 06/27/12 (from the past 48 hour(s))  ABO/RH     Status: Normal   Collection Time   06/27/12  6:30 AM      Component Value Range Comment   ABO/RH(D) O POS     TYPE AND SCREEN     Status: Normal   Collection Time   06/27/12  6:50 AM      Component Value Range Comment   ABO/RH(D) O POS      Antibody Screen NEG      Sample Expiration 06/30/2012     CBC     Status: Abnormal   Collection Time   06/27/12  5:21 PM      Component Value Range Comment   WBC 14.1 (*) 4.0 - 10.5 K/uL    RBC 4.04  3.87 - 5.11 MIL/uL    Hemoglobin 11.8 (*) 12.0 - 15.0 g/dL    HCT 78.2  95.6 - 21.3 %    MCV 89.6  78.0 - 100.0 fL    MCH 29.2  26.0 - 34.0 pg    MCHC 32.6  30.0 - 36.0 g/dL    RDW 08.6  57.8 - 46.9 %    Platelets 228  150 - 400 K/uL   CREATININE, SERUM     Status: Abnormal   Collection Time   06/27/12  5:21 PM      Component Value Range Comment   Creatinine, Ser 0.61  0.50 - 1.10 mg/dL    GFR calc non Af Amer 88 (*) >90 mL/min    GFR calc Af Amer >90  >90 mL/min   CBC     Status: Abnormal   Collection Time   06/28/12  4:10 AM      Component Value Range Comment   WBC 11.0 (*) 4.0 - 10.5 K/uL     RBC 3.58 (*) 3.87 - 5.11 MIL/uL    Hemoglobin 10.5 (*) 12.0 - 15.0 g/dL    HCT 62.9 (*) 52.8 - 46.0 %    MCV 89.4  78.0 - 100.0 fL    MCH 29.3  26.0 - 34.0 pg    MCHC 32.8  30.0 - 36.0 g/dL    RDW 41.3  24.4 - 01.0 %    Platelets 209  150 - 400 K/uL   BASIC METABOLIC PANEL     Status: Abnormal   Collection Time   06/28/12  4:10 AM      Component Value Range Comment   Sodium 138  135 - 145 mEq/L    Potassium 4.4  3.5 - 5.1 mEq/L    Chloride 105  96 - 112 mEq/L    CO2  27  19 - 32 mEq/L    Glucose, Bld 179 (*) 70 - 99 mg/dL    BUN 14  6 - 23 mg/dL    Creatinine, Ser 1.61  0.50 - 1.10 mg/dL    Calcium 8.4  8.4 - 09.6 mg/dL    GFR calc non Af Amer 90 (*) >90 mL/min    GFR calc Af Amer >90  >90 mL/min     Radiology/Results: No results found.  Anti-infectives: Anti-infectives     Start     Dose/Rate Route Frequency Ordered Stop   06/27/12 0617   ertapenem (INVANZ) 1 g in sodium chloride 0.9 % 50 mL IVPB        1 g 100 mL/hr over 30 Minutes Intravenous On call to O.R. 06/27/12 0617 06/27/12 0849          Assessment/Plan: Problem List: Patient Active Problem List  Diagnosis  . Hypertension  . Hypercholesterolemia  . Adenomatous polyps-recurrent  . SVT (supraventricular tachycardia)    No flatus yet 1 Day Post-Op    LOS: 1 day   Matt B. Daphine Deutscher, MD, [facility] [phone] beeper [phone]  06/28/2012 7:48 AM

## 2012-06-29 LAB — CBC
HCT: 29.9 % — ABNORMAL LOW (ref 36.0–46.0)
Hemoglobin: 9.7 g/dL — ABNORMAL LOW (ref 12.0–15.0)
RBC: 3.3 MIL/uL — ABNORMAL LOW (ref 3.87–5.11)
WBC: 7.5 10*3/uL (ref 4.0–10.5)

## 2012-06-29 LAB — BASIC METABOLIC PANEL
BUN: 10 mg/dL (ref 6–23)
Chloride: 107 mEq/L (ref 96–112)
Glucose, Bld: 111 mg/dL — ABNORMAL HIGH (ref 70–99)
Potassium: 3.8 mEq/L (ref 3.5–5.1)

## 2012-06-29 NOTE — Progress Notes (Signed)
Patient ID: Kristen [name], female   DOB: 17-Mar-1941, 72 y.o.   MRN: [id number] [facility] Surgery Progress Note:   2 Days Post-Op  Subjective: Mental status is clear.  Passing flatus Objective: Vital signs in last 24 hours: Temp:  [97.4 F (36.3 C)-98.6 F (37 C)] 98 F (36.7 C) (01/24 0822) Pulse Rate:  [51-57] 51  (01/24 0822) Resp:  [16-18] 16  (01/24 0822) BP: (117-156)/(57-85) 156/82 mmHg (01/24 0822) SpO2:  [96 %-100 %] 97 % (01/24 0822)  Intake/Output from previous day: 01/23 0701 - 01/24 0700 In: 2315 [I.V.:2315] Out: 2350 [Urine:2350] Intake/Output this shift: Total I/O In: -  Out: 450 [Urine:450]  Physical Exam: Work of breathing is normal.  Nontender  Lab Results:  Results for orders placed during the hospital encounter of 06/27/12 (from the past 48 hour(s))  CBC     Status: Abnormal   Collection Time   06/27/12  5:21 PM      Component Value Range Comment   WBC 14.1 (*) 4.0 - 10.5 K/uL    RBC 4.04  3.87 - 5.11 MIL/uL    Hemoglobin 11.8 (*) 12.0 - 15.0 g/dL    HCT 11.9  14.7 - 82.9 %    MCV 89.6  78.0 - 100.0 fL    MCH 29.2  26.0 - 34.0 pg    MCHC 32.6  30.0 - 36.0 g/dL    RDW 56.2  13.0 - 86.5 %    Platelets 228  150 - 400 K/uL   CREATININE, SERUM     Status: Abnormal   Collection Time   06/27/12  5:21 PM      Component Value Range Comment   Creatinine, Ser 0.61  0.50 - 1.10 mg/dL    GFR calc non Af Amer 88 (*) >90 mL/min    GFR calc Af Amer >90  >90 mL/min   CBC     Status: Abnormal   Collection Time   06/28/12  4:10 AM      Component Value Range Comment   WBC 11.0 (*) 4.0 - 10.5 K/uL    RBC 3.58 (*) 3.87 - 5.11 MIL/uL    Hemoglobin 10.5 (*) 12.0 - 15.0 g/dL    HCT 78.4 (*) 69.6 - 46.0 %    MCV 89.4  78.0 - 100.0 fL    MCH 29.3  26.0 - 34.0 pg    MCHC 32.8  30.0 - 36.0 g/dL    RDW 29.5  28.4 - 13.2 %    Platelets 209  150 - 400 K/uL   BASIC METABOLIC PANEL     Status: Abnormal   Collection Time   06/28/12  4:10 AM      Component Value  Range Comment   Sodium 138  135 - 145 mEq/L    Potassium 4.4  3.5 - 5.1 mEq/L    Chloride 105  96 - 112 mEq/L    CO2 27  19 - 32 mEq/L    Glucose, Bld 179 (*) 70 - 99 mg/dL    BUN 14  6 - 23 mg/dL    Creatinine, Ser 4.40  0.50 - 1.10 mg/dL    Calcium 8.4  8.4 - 10.2 mg/dL    GFR calc non Af Amer 90 (*) >90 mL/min    GFR calc Af Amer >90  >90 mL/min   CBC     Status: Abnormal   Collection Time   06/29/12  4:10 AM      Component Value Range  Comment   WBC 7.5  4.0 - 10.5 K/uL    RBC 3.30 (*) 3.87 - 5.11 MIL/uL    Hemoglobin 9.7 (*) 12.0 - 15.0 g/dL    HCT 95.6 (*) 21.3 - 46.0 %    MCV 90.6  78.0 - 100.0 fL    MCH 29.4  26.0 - 34.0 pg    MCHC 32.4  30.0 - 36.0 g/dL    RDW 08.6  57.8 - 46.9 %    Platelets 174  150 - 400 K/uL   BASIC METABOLIC PANEL     Status: Abnormal   Collection Time   06/29/12  4:10 AM      Component Value Range Comment   Sodium 140  135 - 145 mEq/L    Potassium 3.8  3.5 - 5.1 mEq/L    Chloride 107  96 - 112 mEq/L    CO2 27  19 - 32 mEq/L    Glucose, Bld 111 (*) 70 - 99 mg/dL    BUN 10  6 - 23 mg/dL    Creatinine, Ser 6.29  0.50 - 1.10 mg/dL    Calcium 8.2 (*) 8.4 - 10.5 mg/dL    GFR calc non Af Amer 89 (*) >90 mL/min    GFR calc Af Amer >90  >90 mL/min     Radiology/Results: No results found.  Anti-infectives: Anti-infectives     Start     Dose/Rate Route Frequency Ordered Stop   06/27/12 0617   ertapenem (INVANZ) 1 g in sodium chloride 0.9 % 50 mL IVPB        1 g 100 mL/hr over 30 Minutes Intravenous On call to O.R. 06/27/12 0617 06/27/12 0849          Assessment/Plan: Problem List: Patient Active Problem List  Diagnosis  . Hypertension  . Hypercholesterolemia  . Adenomatous polyps-recurrent  . SVT (supraventricular tachycardia)    Doing well.  Will start clear liquid diet 2 Days Post-Op    LOS: 2 days   Matt B. Daphine Deutscher, MD, [facility] [phone] beeper [phone]  06/29/2012 8:43 AM

## 2012-06-30 DIAGNOSIS — K219 Gastro-esophageal reflux disease without esophagitis: Secondary | ICD-10-CM | POA: Diagnosis present

## 2012-06-30 DIAGNOSIS — F325 Major depressive disorder, single episode, in full remission: Secondary | ICD-10-CM | POA: Diagnosis present

## 2012-06-30 MED ORDER — DIPHENHYDRAMINE HCL 50 MG/ML IJ SOLN: 12.5000 mg | Freq: Four times a day (QID) | INTRAMUSCULAR | Status: DC | PRN

## 2012-06-30 MED ORDER — SODIUM CHLORIDE 0.9 % IJ SOLN: 3.0000 mL | INTRAMUSCULAR | Status: DC | PRN

## 2012-06-30 MED ORDER — SODIUM CHLORIDE 0.9 % IJ SOLN: 3.0000 mL | Freq: Two times a day (BID) | INTRAMUSCULAR | Status: DC

## 2012-06-30 MED ORDER — ALUM & MAG HYDROXIDE-SIMETH 200-200-20 MG/5ML PO SUSP: 30.0000 mL | Freq: Four times a day (QID) | ORAL | Status: DC | PRN

## 2012-06-30 MED ORDER — PAROXETINE HCL 20 MG PO TABS
20.0000 mg | ORAL_TABLET | Freq: Every day | ORAL | Status: DC
Start: 2012-06-30 — End: 2012-07-01
  Administered 2012-06-30: 20 mg via ORAL
  Filled 2012-06-30 (×2): qty 1

## 2012-06-30 MED ORDER — SODIUM CHLORIDE 0.9 % IJ SOLN
3.0000 mL | Freq: Two times a day (BID) | INTRAMUSCULAR | Status: DC
  Administered 2012-06-30: 3 mL via INTRAVENOUS

## 2012-06-30 MED ORDER — PROMETHAZINE HCL 25 MG/ML IJ SOLN: 12.5000 mg | Freq: Four times a day (QID) | INTRAMUSCULAR | Status: DC | PRN

## 2012-06-30 MED ORDER — SODIUM CHLORIDE 0.9 % IJ SOLN
3.0000 mL | INTRAMUSCULAR | Status: DC | PRN
  Administered 2012-06-30: 3 mL via INTRAVENOUS

## 2012-06-30 MED ORDER — MAGIC MOUTHWASH
15.0000 mL | Freq: Four times a day (QID) | ORAL | Status: DC | PRN
  Filled 2012-06-30: qty 15

## 2012-06-30 MED ORDER — MENTHOL 3 MG MT LOZG: 1.0000 | LOZENGE | OROMUCOSAL | Status: DC | PRN

## 2012-06-30 MED ORDER — OXYCODONE HCL 5 MG PO TABS: 5.0000 mg | ORAL_TABLET | ORAL | Status: DC | PRN

## 2012-06-30 MED ORDER — PHENOL 1.4 % MT LIQD: 2.0000 | OROMUCOSAL | Status: DC | PRN

## 2012-06-30 MED ORDER — HYDROMORPHONE HCL PF 1 MG/ML IJ SOLN: 0.5000 mg | INTRAMUSCULAR | Status: DC | PRN

## 2012-06-30 MED ORDER — FAMOTIDINE 40 MG PO TABS
40.0000 mg | ORAL_TABLET | Freq: Every day | ORAL | Status: DC
  Administered 2012-06-30: 40 mg via ORAL
  Filled 2012-06-30 (×2): qty 1

## 2012-06-30 MED ORDER — LIP MEDEX EX OINT
1.0000 "application " | TOPICAL_OINTMENT | Freq: Two times a day (BID) | CUTANEOUS | Status: DC
  Administered 2012-06-30 – 2012-07-01 (×3): 1 via TOPICAL

## 2012-06-30 MED ORDER — METOPROLOL TARTRATE 1 MG/ML IV SOLN
5.0000 mg | Freq: Four times a day (QID) | INTRAVENOUS | Status: DC | PRN
  Filled 2012-06-30: qty 5

## 2012-06-30 MED ORDER — ACETAMINOPHEN 500 MG PO TABS
1000.0000 mg | ORAL_TABLET | Freq: Three times a day (TID) | ORAL | Status: DC
  Administered 2012-06-30 – 2012-07-01 (×4): 1000 mg via ORAL
  Filled 2012-06-30 (×6): qty 2

## 2012-06-30 MED ORDER — LACTATED RINGERS IV BOLUS (SEPSIS): 1000.0000 mL | Freq: Three times a day (TID) | INTRAVENOUS | Status: DC | PRN

## 2012-06-30 NOTE — Progress Notes (Addendum)
Kristen [name] [id number] [birth year]   Subjective:  No events Feels good Clears OK - no N/V  Objective:  Vital signs:  Filed Vitals:   06/29/12 0822 06/29/12 1400 06/29/12 2200 06/30/12 0545  BP: 156/82 161/75 169/73 166/72  Pulse: 51 47 55 52  Temp: 98 F (36.7 C) 98.1 F (36.7 C) 98.6 F (37 C) 98.6 F (37 C)  TempSrc: Oral Oral Oral Oral  Resp: 16 18 18 18   Height:      Weight:      SpO2: 97% 99% 97% 96%    Last BM Date: 06/29/12  Intake/Output   Yesterday:  01/24 0701 - 01/25 0700 In: 3270 [P.O.:720; I.V.:2550] Out: 3800 [Urine:3800] This shift:     Bowel function:  Flatus: y  BM: y  Physical Exam:  General: Pt awake/alert/oriented x4 in no acute distress Eyes: PERRL, normal EOM.  Sclera clear.  No icterus Neuro: CN II-XII intact w/o focal sensory/motor deficits. Lymph: No head/neck/groin lymphadenopathy Psych:  No delerium/psychosis/paranoia HENT: Normocephalic, Mucus membranes moist.  No thrush Neck: Supple, No tracheal deviation Chest: No chest wall pain w good excursion CV:  Pulses intact.  Regular rhythm MS: Normal AROM mjr joints.  No obvious deformity Abdomen: Soft.  Nondistended.  Soft.  Dressings c/d/i.  Mildly tender at incisions only.  No incarcerated hernias. Ext:  SCDs BLE.  No mjr edema.  No cyanosis Skin: No petechiae / purpurae  Problem List:  Principal Problem:  *Adenomatous polyps-recurrent Active Problems:  Hypertension  GERD (gastroesophageal reflux disease)  Depression   Assessment  Kristen [name]  72 y.o. female  3 Days Post-Op  Procedure(s): LAPAROSCOPIC RIGHT HEMI COLECTOMY  Postop ileus resolving  Plan:  -wean off IVF -adv diet gradually -GERD - H2B -HTN - metorolol & pain control -Depression - SSRI -VTE prophylaxis- SCDs, etc -mobilize as tolerated to help recovery  D/C patient from hospital when patient meets criteria (prob in 1-2 days):  Tolerating oral intake well Ambulating in  walkways Adequate pain control without IV medications Urinating  Having flatus   Ardeth Sportsman, M.D., F.A.C.S. Gastrointestinal and Minimally Invasive [facility] [address] [place], [place] [address] [phone] Main / Paging [phone] Voice Mail   06/30/2012  CARE TEAM:  PCP: Nadean Corwin, MD  Outpatient Care Team: Patient Care Team: Lucky Cowboy, MD as PCP - General (Internal Medicine)  Inpatient Treatment Team: Treatment Team: Attending Provider: Valarie Merino, MD; Technician: Vella Raring, NT; Registered Nurse: Dina Rich, RN; Dietitian: Lavena Bullion, RD; Registered Nurse: Tristan Schroeder, RN; Registered Nurse: Gwyneth Sprout, RN; Registered Nurse: Jonathon Bellows, RN; Technician: Harrie Jeans Debbink, NT   Results:   Labs: Results for orders placed during the hospital encounter of 06/27/12 (from the past 48 hour(s))  CBC     Status: Abnormal   Collection Time   06/29/12  4:10 AM      Component Value Range Comment   WBC 7.5  4.0 - 10.5 K/uL    RBC 3.30 (*) 3.87 - 5.11 MIL/uL    Hemoglobin 9.7 (*) 12.0 - 15.0 g/dL    HCT 95.2 (*) 84.1 - 46.0 %    MCV 90.6  78.0 - 100.0 fL    MCH 29.4  26.0 - 34.0 pg    MCHC 32.4  30.0 - 36.0 g/dL    RDW 32.4  40.1 - 02.7 %    Platelets 174  150 -  400 K/uL   BASIC METABOLIC PANEL     Status: Abnormal   Collection Time   06/29/12  4:10 AM      Component Value Range Comment   Sodium 140  135 - 145 mEq/L    Potassium 3.8  3.5 - 5.1 mEq/L    Chloride 107  96 - 112 mEq/L    CO2 27  19 - 32 mEq/L    Glucose, Bld 111 (*) 70 - 99 mg/dL    BUN 10  6 - 23 mg/dL    Creatinine, Ser 1.61  0.50 - 1.10 mg/dL    Calcium 8.2 (*) 8.4 - 10.5 mg/dL    GFR calc non Af Amer 89 (*) >90 mL/min    GFR calc Af Amer >90  >90 mL/min     Imaging / Studies: No results found.  Medications / Allergies: per chart  Antibiotics: Anti-infectives      Start     Dose/Rate Route Frequency Ordered Stop   06/27/12 0617   ertapenem (INVANZ) 1 g in sodium chloride 0.9 % 50 mL IVPB        1 g 100 mL/hr over 30 Minutes Intravenous On call to O.R. 06/27/12 0960 06/27/12 4540

## 2012-07-01 DIAGNOSIS — E663 Overweight: Secondary | ICD-10-CM

## 2012-07-01 MED ORDER — OXYCODONE HCL 5 MG PO TABS: 5.0000 mg | ORAL_TABLET | Freq: Four times a day (QID) | ORAL | Status: DC | PRN

## 2012-07-01 NOTE — Discharge Summary (Signed)
Physician Discharge Summary  Patient ID: Kristen [name] MRN: [id number] DOB/AGE: 09-17-1940 72 y.o.  Admit date: 06/27/2012 Discharge date: 07/01/2012  Admission Diagnoses: Principal Problem:  *Adenomatous polyps-recurrent Active Problems:  Hypertension  GERD (gastroesophageal reflux disease)  Depression  Discharge Diagnoses:  Principal Problem:  *Adenomatous polyps-recurrent Active Problems:  Hypertension  GERD (gastroesophageal reflux disease)  Depression  Obesity (BMI 30-39.9)   Discharged Condition: good  Hospital Course:   Pleasant female with recurrent polyps and location from prior partial colectomy.  Underwent laparoscopically assisted resection of the area of recurrence.  Postoperatively, the patient mobilized and advanced to a solid diet gradually as she began to have flatus and bowel movements.  Pain was minimal & well-controlled.    By the time of discharge, the patient was walking well the hallways, eating food well, having flatus.  Pain was-controlled on an oral regimen.  Based on meeting DC criteria and recovering well, I felt it was safe for the patient to be discharged home with close followup.  Instructions were discussed in detail.  They are written as well.  Consults: None  Significant Diagnostic Studies:   Treatments: Lap assisted right partial colectomy  Discharge Exam: Blood pressure 171/84, pulse 48, temperature 97.9 F (36.6 C), temperature source Oral, resp. rate 18, height 5\' 6"  (1.676 m), weight 167 lb (75.751 kg), SpO2 97.00%.  General: Pt awake/alert/oriented x4 in no major acute distress.  Smiling, calm. Eyes: PERRL, normal EOM. Sclera nonicteric Neuro: CN II-XII intact w/o focal sensory/motor deficits. Lymph: No head/neck/groin lymphadenopathy Psych:  No delerium/psychosis/paranoia.  Happy with recovery so far.  In good spirits HENT: Normocephalic, Mucus membranes moist.  No thrush Neck: Supple, No tracheal deviation Chest: No pain.   Good respiratory excursion. CV:  Pulses intact.  Regular rhythm MS: Normal AROM mjr joints.  No obvious deformity Abdomen: Soft, Nondistended.  Incisions closed, Nontender.  No incarcerated hernias. Ext:  SCDs BLE.  No significant edema.  No cyanosis Skin: No petechiae / purpurae   Disposition: 01-Home or Self Care  Discharge Orders    Future Orders Please Complete By Expires   Diet - low sodium heart healthy      Increase activity slowly          Medication List     As of 07/01/2012  7:57 AM    TAKE these medications         atenolol 100 MG tablet   Commonly known as: TENORMIN   Take 100 mg by mouth daily. PT JUST STARTED BACK ON ATENOLOL SAT 06/23/12--STATES SHE HAD BEEN ON ATENOLOL BUT HAD STOPPED LAST OCT 2013 ON HER OWN.  SHE WILL TAKE IN THE MORNINGS.      lisinopril 20 MG tablet   Commonly known as: PRINIVIL,ZESTRIL   Take 20 mg by mouth daily. PT JUST STARTED BACK ON LISINOPRIL SAT 06/23/12 --SHE HAD STOPPED TAKING OCT 2014 ON HER OWN.  SHE IS TAKING EVERY AM      oxyCODONE 5 MG immediate release tablet   Commonly known as: Oxy IR/ROXICODONE   Take 1-2 tablets (5-10 mg total) by mouth every 6 (six) hours as needed for pain.      PARoxetine 40 MG tablet   Commonly known as: PAXIL   Take 20 mg by mouth at bedtime.      ranitidine 300 MG capsule   Commonly known as: ZANTAC   Take 300 mg by mouth at bedtime.      SYSTANE ULTRA 0.4-0.3 % Soln  Generic drug: Polyethyl Glycol-Propyl Glycol   Place 1 drop into both eyes 2 (two) times daily as needed. For dry eyes.           Follow-up Information    Schedule an appointment as soon as possible for a visit with Valarie Merino, MD. (Come by office to get skin staples removed)    Contact information:   [address] [place] [address] [phone]          Signed: Ardeth Sportsman. 07/01/2012, 7:57 AM

## 2012-07-01 NOTE — Progress Notes (Signed)
Assessment unchanged. Pt able to tell nurse when expected to see MD for staple removal and f/u appt. Verbalized understanding of all other dc instructions. Script x1 given as provided by MD. AVS reviewed and pt introduced to My Chart. Pt stated "I'll have to get my niece to help me set that up." Discharged via wheelchair to front entrance to meet awaiting vehicle to carry home. Accompanied by NT and son.

## 2012-07-12 ENCOUNTER — Encounter (INDEPENDENT_AMBULATORY_CARE_PROVIDER_SITE_OTHER): Payer: Self-pay

## 2012-07-12 ENCOUNTER — Ambulatory Visit (INDEPENDENT_AMBULATORY_CARE_PROVIDER_SITE_OTHER): Payer: Medicare Other

## 2012-07-12 DIAGNOSIS — Z4802 Encounter for removal of sutures: Secondary | ICD-10-CM

## 2012-07-19 ENCOUNTER — Encounter (INDEPENDENT_AMBULATORY_CARE_PROVIDER_SITE_OTHER): Payer: Medicare Other | Admitting: Surgery

## 2012-07-27 ENCOUNTER — Encounter (INDEPENDENT_AMBULATORY_CARE_PROVIDER_SITE_OTHER): Payer: Medicare Other | Admitting: Surgery

## 2012-07-31 ENCOUNTER — Encounter (INDEPENDENT_AMBULATORY_CARE_PROVIDER_SITE_OTHER): Payer: Self-pay | Admitting: Surgery

## 2012-08-01 DIAGNOSIS — H5347 Heteronymous bilateral field defects: Secondary | ICD-10-CM | POA: Diagnosis not present

## 2012-08-01 DIAGNOSIS — H5340 Unspecified visual field defects: Secondary | ICD-10-CM | POA: Diagnosis not present

## 2012-08-07 DIAGNOSIS — H43399 Other vitreous opacities, unspecified eye: Secondary | ICD-10-CM | POA: Diagnosis not present

## 2012-08-07 DIAGNOSIS — H43819 Vitreous degeneration, unspecified eye: Secondary | ICD-10-CM | POA: Diagnosis not present

## 2012-08-07 DIAGNOSIS — H5347 Heteronymous bilateral field defects: Secondary | ICD-10-CM | POA: Diagnosis not present

## 2012-08-07 DIAGNOSIS — H5340 Unspecified visual field defects: Secondary | ICD-10-CM | POA: Diagnosis not present

## 2012-10-12 ENCOUNTER — Ambulatory Visit (INDEPENDENT_AMBULATORY_CARE_PROVIDER_SITE_OTHER): Payer: Medicare Other | Admitting: Surgery

## 2012-10-12 ENCOUNTER — Other Ambulatory Visit (INDEPENDENT_AMBULATORY_CARE_PROVIDER_SITE_OTHER): Payer: Self-pay

## 2012-10-12 ENCOUNTER — Encounter (INDEPENDENT_AMBULATORY_CARE_PROVIDER_SITE_OTHER): Payer: Self-pay | Admitting: Surgery

## 2012-10-12 VITALS — BP 164/86 | HR 74 | Temp 97.5°F | Ht 66.0 in | Wt 174.8 lb

## 2012-10-12 DIAGNOSIS — C189 Malignant neoplasm of colon, unspecified: Secondary | ICD-10-CM

## 2012-10-12 DIAGNOSIS — Z85038 Personal history of other malignant neoplasm of large intestine: Secondary | ICD-10-CM | POA: Insufficient documentation

## 2012-10-12 DIAGNOSIS — C801 Malignant (primary) neoplasm, unspecified: Secondary | ICD-10-CM

## 2012-10-12 NOTE — Patient Instructions (Signed)
Followup with Dr. Madilyn Fireman for surveillance colonoscopy See appt at [facility]

## 2012-10-12 NOTE — Progress Notes (Signed)
Allyiah Bartholome Bill 72 y.o.  Body mass index is 28.23 kg/(m^2).  Patient Active Problem List   Diagnosis Date Noted  . Obesity (BMI 30-39.9) 07/01/2012  . GERD (gastroesophageal reflux disease)   . Depression   . SVT (supraventricular tachycardia) 04/21/2012  . Hypertension 02/03/2012  . Hypercholesterolemia 02/03/2012  . Adenomatous polyps-recurrent 02/03/2012    Allergies  Allergen Reactions  . Codeine Nausea And Vomiting  . Fenofibrate Cough    Past Surgical History  Procedure Laterality Date  . Colon resection  1988  . Colonoscopy  09/20/2011    Procedure: COLONOSCOPY;  Surgeon: Barrie Folk, MD;  Location: [facility] ENDOSCOPY;  Service: Endoscopy;  Laterality: N/A;  . Hot hemostasis  09/20/2011    Procedure: HOT HEMOSTASIS (ARGON PLASMA COAGULATION/BICAP);  Surgeon: Barrie Folk, MD;  Location: [facility];  Service: Endoscopy;  Laterality: N/A;  . Cataract extraction  June/july 2013    bilateral eyes  . Colonoscopy  12/15/2011    Procedure: COLONOSCOPY;  Surgeon: Barrie Folk, MD;  Location: [facility]OSCOPY;  Service: Endoscopy;  Laterality: N/A;  . Colonoscopy  01/26/2012    Procedure: COLONOSCOPY;  Surgeon: Florencia Reasons, MD;  Location: [facility];  Service: Endoscopy;  Laterality: N/A;  . Colon surgery    . Eye surgery      cataract bil  . Tumor removal      Left leg  . Back surgery    . Craniotomy  04/18/2012    Procedure: CRANIOTOMY HYPOPHYSECTOMY TRANSNASAL APPROACH;  Surgeon: Cristi Loron, MD;  Location: [facility];  Service: Neurosurgery;  Laterality: Bilateral;  Transphenoidal resection of Pituitary tumor.   . Transnasal approach  04/18/2012    Procedure: TRANSNASAL APPROACH;  Surgeon: Drema Halon, MD;  Location: [facility];  Service: ENT;  Laterality: Bilateral;  Transnasal approach  . Laparoscopic right hemi colectomy  06/27/2012    Procedure: LAPAROSCOPIC RIGHT HEMI COLECTOMY;  Surgeon: Valarie Merino, MD;  Location: [facility];  Service: General;   Laterality: N/A;  Laparoscopic Assited Right Hemicolectomy   MCKEOWN,WILLIAM DAVID, MD No diagnosis found.  Incision healed nicely.  A copy of the path report was given to her. She had a focus of adenocarcinoma in one of these polyps. Nodes were all negative. This is a lady whom I went back to take her right colon out after a needle lens he had taken out part of her colon for polyps many years ago.  She will need surveillance colonoscopy by Dr. Madilyn Fireman on an ongoing basis. 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 All plan to see her again in 6 months and routine followup. Matt B. Daphine Deutscher, MD, [facility] [phone] beeper [phone]  10/12/2012 10:00 AM

## 2012-10-16 ENCOUNTER — Telehealth: Payer: Self-pay | Admitting: *Deleted

## 2012-10-16 NOTE — Telephone Encounter (Signed)
Left VM for patient to return phone call.

## 2012-10-17 ENCOUNTER — Telehealth: Payer: Self-pay | Admitting: *Deleted

## 2012-10-17 NOTE — Telephone Encounter (Signed)
Spoke with patient by phone and confirmed appointment with Dr. Truett Perna for 10/30/12.  Contact names and phone numbers were provided.

## 2012-10-22 ENCOUNTER — Ambulatory Visit: Payer: Medicare Other

## 2012-10-22 ENCOUNTER — Ambulatory Visit: Payer: Medicare Other | Admitting: Radiation Oncology

## 2012-10-30 ENCOUNTER — Telehealth: Payer: Self-pay | Admitting: Oncology

## 2012-10-30 ENCOUNTER — Encounter: Payer: Self-pay | Admitting: Oncology

## 2012-10-30 ENCOUNTER — Ambulatory Visit (HOSPITAL_BASED_OUTPATIENT_CLINIC_OR_DEPARTMENT_OTHER): Payer: Medicare Other | Admitting: Oncology

## 2012-10-30 ENCOUNTER — Ambulatory Visit: Payer: Medicare Other

## 2012-10-30 VITALS — BP 142/77 | HR 70 | Temp 98.0°F | Resp 18 | Ht 66.0 in | Wt 176.9 lb

## 2012-10-30 DIAGNOSIS — C189 Malignant neoplasm of colon, unspecified: Secondary | ICD-10-CM

## 2012-10-30 NOTE — Progress Notes (Signed)
[facility] New Patient Consult   Referring MD: Wenda Low   Kristen [name] 72 y.o.  01-Dec-1940    Reason for Referral: Colon cancer     HPI: She underwent a colonoscopy by Dr. Matthias Hughs on 01/26/2012. In 1999 she underwent a surgical procedure for resection of a large villous adenoma of the right colon. A mass was noted in the transverse colon. The mass was biopsied and tattoos were placed. At the right colon anastomosis there was residual polyp tissue which was biopsied. Dr. Madilyn Fireman had performed a colonoscopy approximately 6 weeks prior for removal of polyp tissue at the site. The pathology revealed fragments of tubular adenoma with high-grade dysplasia at the transverse colon. Biopsy from the "polyp "revealed a tubulovillous adenoma with no high-grade dysplasia or malignancy.  Prior to planned surgery she was noted to have bitemporal hemianopsia by Dr. Luciana Axe. Further workup revealed a sellar and suprasellar lesion with optic nerve compression. She underwent transsphenoidal resection of the lesion by Dr. Lovell Sheehan and Dr. Ezzard Standing on 04/18/2012 and the pathology was consistent with a pituitary adenoma.  She was taken the operating room by Dr. Daphine Deutscher on 06/27/2012 and underwent a laparoscopic assisted resection of the recurrent polyp at the previous anastomosis and the transverse colon lesion.  The pathology ([id number]) revealed a tubulovillous adenoma with no evidence of high-grade dysplasia or malignancy at the anastomotic site. A separate tubular adenoma with no evidence of high-grade dysplasia or malignancy was also seen in the specimen. 4 lymph nodes were negative and the resection margins were negative. The transverse colon lesion was an invasive moderately differentiated adenocarcinoma arising in a 2 per adenoma. Tumor invaded into the submucosa. No evidence of angiolymphatic invasion or perineural invasion. A tubular adenoma with no evidence of high-grade dysplasia or  malignancy was also noted in the specimen. 3 lymph nodes were negative for metastatic disease. The resection margins were negative.  She reports feeling well prior to the colon surgery. She is referred for oncology evaluation.   Past Medical History  Diagnosis Date  . Shortness of breath     exertion  . Pneumonia     hx of pneumonia  . GERD (gastroesophageal reflux disease)   . Headache(784.0)     hx migraines  . Depression   . Colon polyps-multiple, tubulovillous adenoma and tubular adenomas   1999, 2013, 2014   . Arthritis   . Hypertension     pt states she has not taken any b/p medication since her brain surgery in oct 3013--took herself off the medication  . Dysrhythmia 04/2012    SVT - RATE 145 POST OP CRANIOTOMY SURGERY --TX'D AND RESOLVED--PT STATES NO FURTHER PROBLEM THAT SHE IS AWARE OF -SHE HAS NOT HAD TO SEE CARDIOLOGIST  . Pituitary mass-adenoma  NOV 2013    MASS FOUND AFTER PT EXPERIENCED BILATERAL BLURRED / HAZY VISION -ESP LEFT EYE.  S/P CRANIOTOMY AND EYESIGHT RETURNED TO NORMAL--PT STILL HAS A LOT OF TENDERNESS RIGHT SIDE OF NOSE --THE CRANIOTOMY WAS DONE BY TRANSNASAL APPROACH  . PONV (postoperative nausea and vomiting)     PT STATES PLEASE NOTE SHE HAS TENDERNESS RIGHT SIDE OF NOSE - SINCE TRANSNASAL CRANIOTOMY 04/2012   .     History of esophageal stricture treated at [facility]  .    G3 P3  Past Surgical History  Procedure Laterality Date  . Colon resection  1999  . Colonoscopy  09/20/2011    Procedure: COLONOSCOPY;  Surgeon: Barrie Folk, MD;  Location: [facility];  Service: Endoscopy;  Laterality: N/A;  . Hot hemostasis  09/20/2011    Procedure: HOT HEMOSTASIS (ARGON PLASMA COAGULATION/BICAP);  Surgeon: Barrie Folk, MD;  Location: [facility];  Service: Endoscopy;  Laterality: N/A;  . Cataract extraction  June/july 2013    bilateral eyes  . Colonoscopy  12/15/2011    Procedure: COLONOSCOPY;  Surgeon: Barrie Folk, MD;  Location: [facility] ENDOSCOPY;  Service:  Endoscopy;  Laterality: N/A;  . Colonoscopy  01/26/2012    Procedure: COLONOSCOPY;  Surgeon: Florencia Reasons, MD;  Location: [facility];  Service: Endoscopy;  Laterality: N/A;       . Eye surgery      cataract bil  . Tumor removal      Left leg  . Back surgery    . Craniotomy  04/18/2012    Procedure: CRANIOTOMY HYPOPHYSECTOMY TRANSNASAL APPROACH;  Surgeon: Cristi Loron, MD;  Location: [facility];  Service: Neurosurgery;  Laterality: Bilateral;  Transphenoidal resection of Pituitary tumor.   . Transnasal approach  04/18/2012    Procedure: TRANSNASAL APPROACH;  Surgeon: Drema Halon, MD;  Location: [facility];  Service: ENT;  Laterality: Bilateral;  Transnasal approach  . Laparoscopic right hemi colectomy  06/27/2012    Procedure: LAPAROSCOPIC RIGHT HEMI COLECTOMY;  Surgeon: Valarie Merino, MD;  Location: [facility];  Service: General;  Laterality: N/A;  Laparoscopic Assited Right Hemicolectomy    Family History  Problem Relation Age of Onset  .  gastric cancer  Brother  73   . Colon cancer Sister  52   . Heart disease Mother   . Heart disease Father    .   Colon cancer                                                      Nephew                                                   32  .   Breast cancer /hysterectomy                             Sister  .  Colon polyps                                                        Nephew  .  Colon polyps                                                        son  Current outpatient prescriptions:PARoxetine (PAXIL) 40 MG tablet, Take 20 mg by mouth at bedtime. , Disp: , Rfl: ;  ranitidine (ZANTAC) 300 MG capsule, Take 300 mg by mouth at bedtime. , Disp: , Rfl: ;  atenolol (TENORMIN) 100 MG tablet, Take  100 mg by mouth daily. PT JUST STARTED BACK ON ATENOLOL SAT 06/23/12--STATES SHE HAD BEEN ON ATENOLOL BUT HAD STOPPED LAST OCT 2013 ON HER OWN.  SHE WILL TAKE IN THE MORNINGS., Disp: , Rfl:  lisinopril (PRINIVIL,ZESTRIL) 20 MG tablet, Take  20 mg by mouth daily. PT JUST STARTED BACK ON LISINOPRIL SAT 06/23/12 --SHE HAD STOPPED TAKING OCT 2014 ON HER OWN.  SHE IS TAKING EVERY AM, Disp: , Rfl: ;  Polyethyl Glycol-Propyl Glycol (SYSTANE ULTRA) 0.4-0.3 % SOLN, Place 1 drop into both eyes 2 (two) times daily as needed. For dry eyes., Disp: , Rfl:   Allergies:  Allergies  Allergen Reactions  . Codeine Nausea And Vomiting  . Fenofibrate Cough    Social History:  She lives with her sister in [place]. She is a Futures trader. She does not use tobacco. She drinks alcohol on rare occasion. No transfusion history. No risk factor for HIV or hepatitis   ROS:   Positives include: Intentional 30 pound weight loss prior to surgery, occasional solid dysphagia, decreased visual acuity  A complete ROS was otherwise negative.  Physical Exam:  Blood pressure 142/77, pulse 70, temperature 98 F (36.7 C), temperature source Oral, resp. rate 18, height 5\' 6"  (1.676 m), weight 176 lb 14.4 oz (80.241 kg).  HEENT: Oropharynx without visible mass, neck without mass Lungs: Clear bilaterally Cardiac: Regular rate and rhythm Abdomen: No hepatomegaly, no mass  Vascular: No leg edema Lymph nodes: No cervical, supraclavicular, axillary, or inguinal nodes Neurologic: Alert and oriented, the motor exam appears intact in the upper and lower extremities Skin: No rash Musculoskeletal: No spine tenderness   LAB: None today  Radiology: Chest x-ray 06/20/2012-negative   Assessment/Plan:   1. Stage I (T1 N0) . moderately differentiated adenocarcinoma of the transverse colon, status post a partial colectomy 06/27/2012 2. Tubulovillous adenoma at the right colon, status post initial resection in 1999 followed by a repeat resection 06/27/2012 3. Multiple colon polyps noted on the Colon resection specimen 06/27/2012 4. Transsphenoidal resection of a pituitary adenoma 04/18/2012 5. Family history of multiple cancers including colon cancer   Disposition:    Kristen [name] was diagnosed with stage I colon cancer when she underwent a partial colectomy on 06/27/2012. She has a good prognosis for a long-term disease-free survival. There is no indication for adjuvant therapy.  We discussed lifestyle modifications that may improve her prognosis including exercise and diet.   She has an extensive family history of cancer including colon cancer in 2 relatives. She has a personal history of multiple polyps and colon cancer. We will test  the 06/27/2012 tumor for microsatellite instability. She will be referred to the genetics counselor.  Ms. Peabody should continue surveillance colonoscopies with the next colonoscopy in January of 2015.  She plans to continue clinical followup with Dr. Madilyn Fireman and Dr. Oneta Rack. She is not scheduled for a followup appointment in the oncology clinic. We will be glad to see her in the future as needed.  Jaysion Ramseyer 10/30/2012, 6:02 PM

## 2012-10-30 NOTE — Progress Notes (Signed)
Met with patient and her niece.  Explained role of nurse navigator.  Educational information provided on colon cancer.  Per Dr. Truett Perna, patient does not need any treatment.  She will make an appointment to see the Genetics counselor.  Request for MSI/IHC testing per order of Dr. Truett Perna was sent to pathology on accession # [id number] .  Patient was reminded that she will need another colonoscopy 1 year from the last one.  Patient was without questions.

## 2012-10-30 NOTE — Telephone Encounter (Signed)
, °

## 2012-10-30 NOTE — Progress Notes (Signed)
Checked in new patient. She didn't have her [employer] card, but still has. No financial issues.

## 2012-10-31 DIAGNOSIS — D126 Benign neoplasm of colon, unspecified: Secondary | ICD-10-CM | POA: Diagnosis not present

## 2012-10-31 DIAGNOSIS — C189 Malignant neoplasm of colon, unspecified: Secondary | ICD-10-CM | POA: Diagnosis not present

## 2012-12-13 ENCOUNTER — Other Ambulatory Visit: Payer: Medicare Other | Admitting: Lab

## 2012-12-13 ENCOUNTER — Ambulatory Visit (HOSPITAL_BASED_OUTPATIENT_CLINIC_OR_DEPARTMENT_OTHER): Payer: Medicare Other | Admitting: Genetic Counselor

## 2012-12-13 ENCOUNTER — Encounter: Payer: Self-pay | Admitting: Genetic Counselor

## 2012-12-13 DIAGNOSIS — Z85038 Personal history of other malignant neoplasm of large intestine: Secondary | ICD-10-CM

## 2012-12-13 DIAGNOSIS — D369 Benign neoplasm, unspecified site: Secondary | ICD-10-CM

## 2012-12-13 NOTE — Progress Notes (Signed)
Dr.  Thornton Papas requested a consultation for genetic counseling and risk assessment for Kristen [name], a 72 y.o. female, for discussion of her personal history of colon cancer (x2), pituitary adenoma and colon polyps and family history of colon polyps and colon, stomach and breast cancer.  She presents to clinic today to discuss the possibility of a genetic predisposition to cancer, and to further clarify her risks, as well as her family members' risks for cancer.   HISTORY OF PRESENT ILLNESS: In 1988, at the age of 41, Kristen [name] was diagnosed with colon cancer, which was treated with a colon resection but not with chemotherapy.  In 2014, at the age of 53, [name] was diagnosed with colon cancer which involved another colon resection.  This tumor was MSI stable with no loss of IHC.  [name] has also had a pituitary tumor, and reports having approximately 10 polyps in her lifetime.    Past Medical History  Diagnosis Date  . Shortness of breath     exertion  . Pneumonia     hx of pneumonia  . GERD (gastroesophageal reflux disease)   . Headache(784.0)     hx migraines  . Depression   . Colon polyps   . Arthritis   . Hypertension     pt states she has not taken any b/p medication since her brain surgery in oct 3013--took herself off the medication  . Dysrhythmia 04/2012    SVT - RATE 145 POST OP CRANIOTOMY SURGERY --TX'D AND RESOLVED--PT STATES NO FURTHER PROBLEM THAT SHE IS AWARE OF -SHE HAS NOT HAD TO SEE CARDIOLOGIST  . Pituitary mass NOV 2013    MASS FOUND AFTER PT EXPERIENCED BILATERAL BLURRED / HAZY VISION -ESP LEFT EYE.  S/P CRANIOTOMY AND EYESIGHT RETURNED TO NORMAL--PT STILL HAS A LOT OF TENDERNESS RIGHT SIDE OF NOSE --THE CRANIOTOMY WAS DONE BY TRANSNASAL APPROACH  . PONV (postoperative nausea and vomiting)     PT STATES PLEASE NOTE SHE HAS TENDERNESS RIGHT SIDE OF NOSE - SINCE TRANSNASAL CRANIOTOMY 04/2012  . Colon cancer 1990s, 2014    Past Surgical History   Procedure Laterality Date  . Colon resection  1988  . Colonoscopy  09/20/2011    Procedure: COLONOSCOPY;  Surgeon: Barrie Folk, MD;  Location: [facility];  Service: Endoscopy;  Laterality: N/A;  . Hot hemostasis  09/20/2011    Procedure: HOT HEMOSTASIS (ARGON PLASMA COAGULATION/BICAP);  Surgeon: Barrie Folk, MD;  Location: [facility];  Service: Endoscopy;  Laterality: N/A;  . Cataract extraction  June/july 2013    bilateral eyes  . Colonoscopy  12/15/2011    Procedure: COLONOSCOPY;  Surgeon: Barrie Folk, MD;  Location: [facility]COPY;  Service: Endoscopy;  Laterality: N/A;  . Colonoscopy  01/26/2012    Procedure: COLONOSCOPY;  Surgeon: Florencia Reasons, MD;  Location: [facility];  Service: Endoscopy;  Laterality: N/A;  . Colon surgery    . Eye surgery      cataract bil  . Tumor removal      Left leg  . Back surgery    . Craniotomy  04/18/2012    Procedure: CRANIOTOMY HYPOPHYSECTOMY TRANSNASAL APPROACH;  Surgeon: Cristi Loron, MD;  Location: [facility];  Service: Neurosurgery;  Laterality: Bilateral;  Transphenoidal resection of Pituitary tumor.   . Transnasal approach  04/18/2012    Procedure: TRANSNASAL APPROACH;  Surgeon: Drema Halon, MD;  Location: [facility];  Service: ENT;  Laterality: Bilateral;  Transnasal  approach  . Laparoscopic right hemi colectomy  06/27/2012    Procedure: LAPAROSCOPIC RIGHT HEMI COLECTOMY;  Surgeon: Valarie Merino, MD;  Location: [facility];  Service: General;  Laterality: N/A;  Laparoscopic Assited Right Hemicolectomy    History   Social History  . Marital Status: Widowed    Spouse Name: N/A    Number of Children: 3  . Years of Education: N/A   Social History Main Topics  . Smoking status: Never Smoker   . Smokeless tobacco: None  . Alcohol Use: No     Comment: a couple times a year, one drink at a time  . Drug Use: No  . Sexually Active: None   Other Topics Concern  . None   Social History Narrative  . None     REPRODUCTIVE HISTORY AND PERSONAL RISK ASSESSMENT FACTORS: Colonoscopy:  Yes Colon Polyps: ~10 lifetime Uterus Intact: yes Ovaries Intact: yes G3P3A0, first live birth at age 2  Smoking Status: Non Smoker Alcohol intake: approximately 1 drink per year.    FAMILY HISTORY:  We obtained a detailed, 4-generation family history.  Significant diagnoses are listed below: Family History  Problem Relation Age of Onset  . Colon cancer Sister 23  . Heart disease Mother   . Heart disease Father   . Cancer Maternal Aunt     unknown form of cancer dx in her 9s-50s  . Liver cancer Maternal Uncle     heavy ETOH user  . Breast cancer Sister 48  . Dementia Sister   . Colon cancer Cousin   . Colon polyps Son   . Colon polyps Son   . Colon cancer Other 30  . Colon polyps Other   . Brain cancer Cousin     dx in his 55s; paternal cousin   Patient's maternal ancestors are of unknown descent, and paternal ancestors are of [place] and [place] descent. There is no reported Ashkenazi Jewish ancestry. There is no  known consanguinity.  GENETIC COUNSELING ASSESSMENT: [name] is a 72 y.o. female with a personal history of colorectal cancer and family history of colon, stomach and breast cacner which somewhat suggestive of Lynch syndrome and predisposition to cancer. We, therefore, discussed and recommended the following at today's visit.   DISCUSSION: We reviewed the characteristics, features and inheritance patterns of hereditary cancer syndromes. We also discussed genetic testing, including the appropriate family members to test, the process of testing, insurance coverage and turn-around-time for results. We recommended Kristen [name] pursue genetic testing for Colon Cancer Panel at [facility].   PLAN: After considering the risks, benefits, and limitations, Kristen [name] provided informed consent to pursue genetic testing and the blood sample will be sent to [facility] for analysis of the Colon cancer panel. We discussed the implications of a positive, negative and/ or variant of uncertain significance genetic test result. Results should be available within approximately 4-5 weeks' time, at which point they will be disclosed by telephone to [facility], as will any additional her recommendations warranted by these results. Joyceann CARMON BRIGANDI will receive a summary of her genetic counseling visit and a copy of her results once available. This information will also be available in Epic. We encouraged Lindee JAMELLE NOY to remain in contact with cancer genetics annually so that we can continuously update the family history and inform her of any changes in cancer genetics and testing that may be of benefit for her family. Yoneko M Pawelski's questions  were answered to her satisfaction today. Our contact information was provided should additional questions or concerns arise.   Per the patient's request, we will contact her by telephone to discuss these results. A follow up genetic counseling visit will be scheduled if indicated.  The patient was seen for a total of 60 minutes, greater than 50% of which was spent face-to-face counseling.  This plan is being carried out per Dr. Feliz Beam recommendations.  This note will also be sent to the referring provider via the electronic medical record. The patient will be supplied with a summary of this genetic counseling discussion as well as educational information on the discussed hereditary cancer syndromes following the conclusion of their visit.   Patient was discussed with Dr. Drue Second.   _______________________________________________________________________ For Office Staff:  Number of people involved in session: 2 Was an Intern/ student involved with case: no

## 2012-12-14 DIAGNOSIS — D369 Benign neoplasm, unspecified site: Secondary | ICD-10-CM | POA: Diagnosis not present

## 2012-12-14 DIAGNOSIS — Z85038 Personal history of other malignant neoplasm of large intestine: Secondary | ICD-10-CM | POA: Diagnosis not present

## 2013-01-03 ENCOUNTER — Encounter: Payer: Self-pay | Admitting: Genetic Counselor

## 2013-01-03 ENCOUNTER — Telehealth: Payer: Self-pay | Admitting: Genetic Counselor

## 2013-01-03 NOTE — Telephone Encounter (Signed)
Revealed negative genetic test results with a CDH1 likely benign variant.

## 2013-03-04 DIAGNOSIS — E782 Mixed hyperlipidemia: Secondary | ICD-10-CM | POA: Diagnosis not present

## 2013-03-04 DIAGNOSIS — E559 Vitamin D deficiency, unspecified: Secondary | ICD-10-CM | POA: Diagnosis not present

## 2013-03-04 DIAGNOSIS — Z79899 Other long term (current) drug therapy: Secondary | ICD-10-CM | POA: Diagnosis not present

## 2013-03-04 DIAGNOSIS — E278 Other specified disorders of adrenal gland: Secondary | ICD-10-CM | POA: Diagnosis not present

## 2013-03-04 DIAGNOSIS — R7309 Other abnormal glucose: Secondary | ICD-10-CM | POA: Diagnosis not present

## 2013-03-04 DIAGNOSIS — I10 Essential (primary) hypertension: Secondary | ICD-10-CM | POA: Diagnosis not present

## 2013-03-22 ENCOUNTER — Encounter (INDEPENDENT_AMBULATORY_CARE_PROVIDER_SITE_OTHER): Payer: Self-pay | Admitting: Surgery

## 2013-04-17 ENCOUNTER — Ambulatory Visit (INDEPENDENT_AMBULATORY_CARE_PROVIDER_SITE_OTHER): Payer: Medicare Other | Admitting: Surgery

## 2013-04-17 ENCOUNTER — Encounter (INDEPENDENT_AMBULATORY_CARE_PROVIDER_SITE_OTHER): Payer: Self-pay | Admitting: Surgery

## 2013-04-17 ENCOUNTER — Ambulatory Visit: Payer: Medicare Other | Admitting: *Deleted

## 2013-04-17 VITALS — BP 124/64 | HR 70 | Resp 18 | Ht 66.0 in | Wt 171.8 lb

## 2013-04-17 DIAGNOSIS — Z23 Encounter for immunization: Secondary | ICD-10-CM | POA: Diagnosis not present

## 2013-04-17 DIAGNOSIS — Z85038 Personal history of other malignant neoplasm of large intestine: Secondary | ICD-10-CM

## 2013-04-17 NOTE — Progress Notes (Signed)
Kristen [name] 72 y.o.  Body mass index is 27.74 kg/(m^2).  Patient Active Problem List   Diagnosis Date Noted  . History of colon cancer, stage II 10/12/2012  . Obesity (BMI 30-39.9) 07/01/2012  . GERD (gastroesophageal reflux disease)   . Depression   . SVT (supraventricular tachycardia) 04/21/2012  . Hypertension 02/03/2012  . Hypercholesterolemia 02/03/2012  . Adenomatous polyps-recurrent 02/03/2012    Allergies  Allergen Reactions  . Codeine Nausea And Vomiting  . Fenofibrate Cough    Past Surgical History  Procedure Laterality Date  . Colon resection  1988  . Colonoscopy  09/20/2011    Procedure: COLONOSCOPY;  Surgeon: Barrie Folk, MD;  Location: [facility];  Service: Endoscopy;  Laterality: N/A;  . Hot hemostasis  09/20/2011    Procedure: HOT HEMOSTASIS (ARGON PLASMA COAGULATION/BICAP);  Surgeon: Barrie Folk, MD;  Location: [facility];  Service: Endoscopy;  Laterality: N/A;  . Cataract extraction  June/july 2013    bilateral eyes  . Colonoscopy  12/15/2011    Procedure: COLONOSCOPY;  Surgeon: Barrie Folk, MD;  Location: [facility] ENDOSCOPY;  Service: Endoscopy;  Laterality: N/A;  . Colonoscopy  01/26/2012    Procedure: COLONOSCOPY;  Surgeon: Florencia Reasons, MD;  Location: [facility];  Service: Endoscopy;  Laterality: N/A;  . Colon surgery    . Eye surgery      cataract bil  . Tumor removal      Left leg  . Back surgery    . Craniotomy  04/18/2012    Procedure: CRANIOTOMY HYPOPHYSECTOMY TRANSNASAL APPROACH;  Surgeon: Cristi Loron, MD;  Location: [facility];  Service: Neurosurgery;  Laterality: Bilateral;  Transphenoidal resection of Pituitary tumor.   . Transnasal approach  04/18/2012    Procedure: TRANSNASAL APPROACH;  Surgeon: Drema Halon, MD;  Location: [facility];  Service: ENT;  Laterality: Bilateral;  Transnasal approach  . Laparoscopic right hemi colectomy  06/27/2012    Procedure: LAPAROSCOPIC RIGHT HEMI COLECTOMY;  Surgeon: Valarie Merino, MD;  Location: [facility];  Service: General;  Laterality: N/A;  Laparoscopic Assited Right Hemicolectomy   MCKEOWN,WILLIAM DAVID, MD No diagnosis found.  followup for Kristen [name] who had her lap assisted right hemicolectomy in January. Her incision has healed fine. She is genetic counseling which did not show any genetic predisposition for recurrent colon polyps and cancer. She's also seen Dr. Truett Perna.  Since he seems to be doing very well I will be glad to see her again when necessary. Dr. Madilyn Fireman will to her followup colonoscopies. Matt B. Daphine Deutscher, MD, [facility] [phone] beeper [phone]  04/17/2013 4:10 PM

## 2013-04-17 NOTE — Patient Instructions (Signed)
Thanks for your patience.  If you need further assistance after leaving the office, please call our office and speak with a [facility] nurse.  [phone].  If you want to leave a message for Dr. Nakaya Mishkin, please call his office phone at [phone]. 

## 2013-06-10 DIAGNOSIS — E559 Vitamin D deficiency, unspecified: Secondary | ICD-10-CM | POA: Insufficient documentation

## 2013-06-10 DIAGNOSIS — R7303 Prediabetes: Secondary | ICD-10-CM | POA: Insufficient documentation

## 2013-06-11 ENCOUNTER — Encounter: Payer: Medicare Other | Admitting: Internal Medicine

## 2013-06-11 DIAGNOSIS — E782 Mixed hyperlipidemia: Secondary | ICD-10-CM | POA: Insufficient documentation

## 2013-06-11 NOTE — Progress Notes (Signed)
Patient ID: Kristen [name], female   DOB: [birth year], 73 y.o.   MRN: [id number]   This very nice 73 y.o. [facility] presents for 3 month follow up with Hypertension, Hyperlipidemia, Pre-Diabetes and Vitamin D Deficiency.    HTN predates since   . BP has been controlled at home. Today's   . Patient denies any cardiac type chest pain, palpitations, dyspnea/orthopnea/PND, dizziness, claudication, or dependent edema.   Hyperlipidemia is controlled with diet & meds. Last Cholesterol was  , Triglycerides were    , HDL    and LDL    . Patient denies myalgias or other med SE's.    Also, the patient has history of PreDiabetes/insulin resistance since     with last A1c of    . Patient denies any symptoms of reactive hypoglycemia, diabetic polys, paresthesias or visual blurring.   Further, Patient has history of Vitamin D Deficiency with last vitamin D of   . Patient supplements vitamin D without any suspected side-effects.    Medication List         aspirin 81 MG tablet  Take 81 mg by mouth daily.     atenolol 100 MG tablet  Commonly known as:  TENORMIN       lisinopril 20 MG tablet  Commonly known as:  PRINIVIL,ZESTRIL       PARoxetine 40 MG tablet  Commonly known as:  PAXIL  Take 20 mg by mouth at bedtime.     ranitidine 300 MG capsule  Commonly known as:  ZANTAC  Take 300 mg by mouth at bedtime.     SYSTANE ULTRA 0.4-0.3 % Soln  Generic drug:  Polyethyl Glycol-Propyl Glycol  Place 1 drop into both eyes 2 (two) times daily as needed. For dry eyes.          Allergies  Allergen Reactions  . Aspirin     High Doses  . Codeine Nausea And Vomiting  . Fenofibrate Cough  . Hydrochlorothiazide     Fatigue  . Metoclopramide     Sedation    PMHx:   Past Medical History  Diagnosis Date  . Shortness of breath     exertion  . Pneumonia     hx of pneumonia  . GERD (gastroesophageal reflux disease)   . Headache(784.0)     hx migraines  . Depression   . Colon polyps   . Arthritis    . Hypertension     pt states she has not taken any b/p medication since her brain surgery in oct 3013--took herself off the medication  . Dysrhythmia 04/2012    SVT - RATE 145 POST OP CRANIOTOMY SURGERY --TX'D AND RESOLVED--PT STATES NO FURTHER PROBLEM THAT SHE IS AWARE OF -SHE HAS NOT HAD TO SEE CARDIOLOGIST  . Pituitary mass NOV 2013    MASS FOUND AFTER PT EXPERIENCED BILATERAL BLURRED / HAZY VISION -ESP LEFT EYE.  S/P CRANIOTOMY AND EYESIGHT RETURNED TO NORMAL--PT STILL HAS A LOT OF TENDERNESS RIGHT SIDE OF NOSE --THE CRANIOTOMY WAS DONE BY TRANSNASAL APPROACH  . PONV (postoperative nausea and vomiting)     PT STATES PLEASE NOTE SHE HAS TENDERNESS RIGHT SIDE OF NOSE - SINCE TRANSNASAL CRANIOTOMY 04/2012  . Colon cancer 1990s, 2014  . Vitamin D deficiency   . Prediabetes     FHx:    Reviewed / unchanged  SHx:    Reviewed / unchanged  Systems Review: Constitutional: Denies fever, chills, wt changes, headaches, insomnia, fatigue, night sweats, change in appetite. Eyes:  Denies redness, blurred vision, diplopia, discharge, itchy, watery eyes.  ENT: Denies discharge, congestion, post nasal drip, epistaxis, sore throat, earache, hearing loss, dental pain, tinnitus, vertigo, sinus pain, snoring.  CV: Denies chest pain, palpitations, irregular heartbeat, syncope, dyspnea, diaphoresis, orthopnea, PND, claudication, edema. Respiratory: denies cough, dyspnea, DOE, pleurisy, hoarseness, laryngitis, wheezing.  Gastrointestinal: Denies dysphagia, odynophagia, heartburn, reflux, water brash, abdominal pain or cramps, nausea, vomiting, bloating, diarrhea, constipation, hematemesis, melena, hematochezia,  or hemorrhoids. Genitourinary: Denies dysuria, frequency, urgency, nocturia, hesitancy, discharge, hematuria, flank pain. Musculoskeletal: Denies arthralgias, myalgias, stiffness, jt. swelling, pain, limp, strain/sprain.  Skin: Denies pruritus, rash, hives, warts, acne, eczema, change in skin  lesion(s). Neuro: No weakness, tremor, incoordination, spasms, paresthesia, or pain. Psychiatric: Denies confusion, memory loss, or sensory loss. Endo: Denies change in weight, skin, hair change.  Heme/Lymph: No excessive bleeding, bruising, orenlarged lymph nodes.  There were no vitals filed for this visit.  Estimated body mass index is 27.74 kg/(m^2) as calculated from the following:   Height as of 04/17/13: 5\' 6"  (1.676 m).   Weight as of 04/17/13: 171 lb 12.8 oz (77.928 kg).  On Exam: Appears well nourished - in no distress. Eyes: PERRLA, EOMs, conjunctiva no swelling or erythema. Sinuses: No frontal/maxillary tenderness ENT/Mouth: EAC's clear, TM's nl w/o erythema, bulging. Nares clear w/o erythema, swelling, exudates. Oropharynx clear without erythema or exudates. Oral hygiene is good. Tongue normal, non obstructing. Hearing intact.  Neck: Supple. Thyroid nl. Car 2+/2+ without bruits, nodes or JVD. Chest: Respirations nl with BS clear & equal w/o rales, rhonchi, wheezing or stridor.  Cor: Heart sounds normal w/ regular rate and rhythm without sig. murmurs, gallops, clicks, or rubs. Peripheral pulses normal and equal  without edema.  Abdomen: Soft & bowel sounds normal. Non-tender w/o guarding, rebound, hernias, masses, or organomegaly.  Lymphatics: Unremarkable.  Musculoskeletal: Full ROM all peripheral extremities, joint stability, 5/5 strength, and normal gait.  Skin: Warm, dry without exposed rashes, lesions, ecchymosis apparent.  Neuro: Cranial nerves intact, reflexes equal bilaterally. Sensory-motor testing grossly intact. Tendon reflexes grossly intact.  Pysch: Alert & oriented x 3. Insight and judgement nl & appropriate. No ideations.  Assessment and Plan:  1. Hypertension - Continue monitor blood pressure at home. Continue diet/meds same.  2. Hyperlipidemia - Continue diet/meds, exercise,& lifestyle modifications. Continue monitor periodic cholesterol/liver & renal  functions   3. Pre-diabetes/Insulin Resistance - Continue diet, exercise, lifestyle modifications. Monitor appropriate labs.  4. Vitamin D Deficiency - Continue supplementation.  Recommended regular exercise, BP monitoring, weight control, and discussed med and SE's. Recommended labs to assess and monitor clinical status. Further disposition pending results of labs.  This encounter was created in error - please disregard.

## 2013-06-11 NOTE — Patient Instructions (Signed)

## 2013-07-16 DIAGNOSIS — Z86010 Personal history of colonic polyps: Secondary | ICD-10-CM | POA: Diagnosis not present

## 2013-07-16 DIAGNOSIS — R197 Diarrhea, unspecified: Secondary | ICD-10-CM | POA: Diagnosis not present

## 2013-08-29 ENCOUNTER — Other Ambulatory Visit: Payer: Self-pay | Admitting: Gastroenterology

## 2013-08-29 DIAGNOSIS — Z09 Encounter for follow-up examination after completed treatment for conditions other than malignant neoplasm: Secondary | ICD-10-CM | POA: Diagnosis not present

## 2013-08-29 DIAGNOSIS — Z86010 Personal history of colonic polyps: Secondary | ICD-10-CM | POA: Diagnosis not present

## 2013-08-29 DIAGNOSIS — K639 Disease of intestine, unspecified: Secondary | ICD-10-CM | POA: Diagnosis not present

## 2013-08-29 DIAGNOSIS — D126 Benign neoplasm of colon, unspecified: Secondary | ICD-10-CM | POA: Diagnosis not present

## 2013-09-10 ENCOUNTER — Encounter: Payer: Self-pay | Admitting: Internal Medicine

## 2013-09-10 ENCOUNTER — Ambulatory Visit: Payer: Medicare Other | Admitting: Internal Medicine

## 2013-09-10 VITALS — BP 144/80 | HR 60 | Temp 98.1°F | Resp 16 | Ht 64.5 in | Wt 170.8 lb

## 2013-09-10 DIAGNOSIS — R7309 Other abnormal glucose: Secondary | ICD-10-CM | POA: Diagnosis not present

## 2013-09-10 DIAGNOSIS — Z789 Other specified health status: Secondary | ICD-10-CM

## 2013-09-10 DIAGNOSIS — E782 Mixed hyperlipidemia: Secondary | ICD-10-CM

## 2013-09-10 DIAGNOSIS — Z1331 Encounter for screening for depression: Secondary | ICD-10-CM

## 2013-09-10 DIAGNOSIS — E559 Vitamin D deficiency, unspecified: Secondary | ICD-10-CM | POA: Diagnosis not present

## 2013-09-10 DIAGNOSIS — N6019 Diffuse cystic mastopathy of unspecified breast: Secondary | ICD-10-CM

## 2013-09-10 DIAGNOSIS — R7303 Prediabetes: Secondary | ICD-10-CM

## 2013-09-10 DIAGNOSIS — Z1212 Encounter for screening for malignant neoplasm of rectum: Secondary | ICD-10-CM

## 2013-09-10 DIAGNOSIS — I10 Essential (primary) hypertension: Secondary | ICD-10-CM | POA: Diagnosis not present

## 2013-09-10 DIAGNOSIS — Z79899 Other long term (current) drug therapy: Secondary | ICD-10-CM | POA: Insufficient documentation

## 2013-09-10 LAB — CBC WITH DIFFERENTIAL/PLATELET
Basophils Absolute: 0.1 10*3/uL (ref 0.0–0.1)
Basophils Relative: 1 % (ref 0–1)
EOS ABS: 0.2 10*3/uL (ref 0.0–0.7)
EOS PCT: 3 % (ref 0–5)
HCT: 36.1 % (ref 36.0–46.0)
Hemoglobin: 12.3 g/dL (ref 12.0–15.0)
LYMPHS ABS: 1.7 10*3/uL (ref 0.7–4.0)
LYMPHS PCT: 28 % (ref 12–46)
MCH: 28.6 pg (ref 26.0–34.0)
MCHC: 34.1 g/dL (ref 30.0–36.0)
MCV: 84 fL (ref 78.0–100.0)
MONOS PCT: 7 % (ref 3–12)
Monocytes Absolute: 0.4 10*3/uL (ref 0.1–1.0)
Neutro Abs: 3.7 10*3/uL (ref 1.7–7.7)
Neutrophils Relative %: 61 % (ref 43–77)
Platelets: 260 10*3/uL (ref 150–400)
RBC: 4.3 MIL/uL (ref 3.87–5.11)
RDW: 14.1 % (ref 11.5–15.5)
WBC: 6 10*3/uL (ref 4.0–10.5)

## 2013-09-10 MED ORDER — ATORVASTATIN CALCIUM 80 MG PO TABS
ORAL_TABLET | ORAL | Status: DC
Start: 2013-09-10 — End: 2013-10-15

## 2013-09-10 NOTE — Progress Notes (Signed)
Patient ID: Kristen [name], female   DOB: 1941-01-14, 73 y.o.   MRN: [id number]   Annual Screening Comprehensive Examination  This very nice 73 y.o. female presents for complete physical.  Patient has been followed for HTN, Prediabetes, Hyperlipidemia, and Vitamin D Deficiency.    HTN predates since 1999. Patient's BP has been controlled at home. Today's BP: 144/80 mmHg. Patient denies any cardiac symptoms as chest pain, palpitations, shortness of breath, dizziness or ankle swelling.   Patient's hyperlipidemia is controlled with diet and medications. Patient denies myalgias or other medication SE's. Last cholesterol last visit was 199, triglycerides 149, HDL 45 and LDL 124 in Oct 2014 - not at goal and she was started on Atorvastatin 80 mg x 1/2 tab 3 x wk.     Patient has prediabetes/insulin resistance predating since July 2011 with A1c 5.5% and elevated insulin at 90 and with last A1c 6.1% in Oct 2014. Patient denies reactive hypoglycemic symptoms, visual blurring, diabetic polys, or paresthesias.    Finally, patient has history of Vitamin D Deficiency of 31 in 2008 and with last vitamin D was 45 in Oct 2014 with recommendation to start 5000-6000 u/da.     Medication Sig  . aspirin 81 MG tablet Take 81 mg by mouth daily.  Marland Kitchen atenolol (TENORMIN) 100 MG tablet Take 1 TAB Daily  . lisinopril 20 MG tablet Take 20 mg by mouth daily.   Marland Kitchen PARoxetine (PAXIL) 40 MG tablet Take 20 mg by mouth at bedtime.   . ranitidine (ZANTAC) 300 MG capsule Take 300 mg by mouth at bedtime.      Allergies  Allergen Reactions  . Aspirin     High Doses  . Codeine Nausea And Vomiting  . Fenofibrate Cough  . Hydrochlorothiazide     Fatigue  . Metoclopramide     Sedation    Past Medical History  Diagnosis Date  . Shortness of breath     exertion  . Pneumonia     hx of pneumonia  . GERD (gastroesophageal reflux disease)   . Headache(784.0)     hx migraines  . Depression   . Colon polyps   .  Arthritis   . Hypertension     pt states she has not taken any b/p medication since her brain surgery in oct 3013--took herself off the medication  . Dysrhythmia 04/2012    SVT - RATE 145 POST OP CRANIOTOMY SURGERY --TX'D AND RESOLVED--PT STATES NO FURTHER PROBLEM THAT SHE IS AWARE OF -SHE HAS NOT HAD TO SEE CARDIOLOGIST  . Pituitary mass NOV 2013    MASS FOUND AFTER PT EXPERIENCED BILATERAL BLURRED / HAZY VISION -ESP LEFT EYE.  S/P CRANIOTOMY AND EYESIGHT RETURNED TO NORMAL--PT STILL HAS A LOT OF TENDERNESS RIGHT SIDE OF NOSE --THE CRANIOTOMY WAS DONE BY TRANSNASAL APPROACH  . PONV (postoperative nausea and vomiting)     PT STATES PLEASE NOTE SHE HAS TENDERNESS RIGHT SIDE OF NOSE - SINCE TRANSNASAL CRANIOTOMY 04/2012  . Colon cancer 1990s, 2014  . Vitamin D deficiency   . Prediabetes     Past Surgical History  Procedure Laterality Date  . Colon resection  1988  . Colonoscopy  09/20/2011    Procedure: COLONOSCOPY;  Surgeon: Missy Sabins, MD;  Location: [facility];  Service: Endoscopy;  Laterality: N/A;  . Hot hemostasis  09/20/2011      . Cataract extraction  June/july 2013    bilateral eyes  . Colonoscopy  12/15/2011    Procedure: COLONOSCOPY;  Surgeon: Missy Sabins, MD;   . Colonoscopy  01/26/2012    Procedure: COLONOSCOPY;  Surgeon: Cleotis Nipper, MD                              Mar 2015  . Colon surgery    . Eye surgery      cataract bil  . Tumor removal      Left leg  . Back surgery    . Craniotomy  04/18/2012    Procedure: CRANIOTOMY HYPOPHYSECTOMY TRANSNASAL APPROACH;  Surgeon: Ophelia Charter, MD;  L Bilateral;  Transphenoidal resection of Pituitary tumor.     Procedure: TRANSNASAL APPROACH;  Surgeon: Rozetta Nunnery, MD;  Location: [facility];  Service: ENT;  Laterality: Bilateral;  Transnasal approach  . Laparoscopic right hemi colectomy  06/27/2012    Procedure: LAPAROSCOPIC RIGHT HEMI COLECTOMY;  Surgeon: Pedro Earls, MD;  Location: [facility];  Service:  General;  Laterality: N/A;  Laparoscopic Assited Right Hemicolectomy    Family History  Problem Relation Age of Onset  . Colon cancer Sister 41  . Heart disease Mother   . Heart disease Father   . Cancer Maternal Aunt     unknown form of cancer dx in her 42s-50s  . Liver cancer Maternal Uncle     heavy ETOH user  . Breast cancer Sister 69  . Dementia Sister   . Colon cancer Cousin   . Colon polyps Son   . Colon polyps Son   . Colon cancer Other 30  . Colon polyps Other   . Brain cancer Cousin     dx in his 58s; paternal cousin    History  Substance Use Topics  . Smoking status: Never Smoker   . Smokeless tobacco: Not on file  . Alcohol Use: No     Comment: a couple times a year, one drink at a time    ROS Constitutional: Denies fever, chills, weight loss/gain, headaches, insomnia, fatigue, night sweats, and change in appetite. Eyes: Denies redness, blurred vision, diplopia, discharge, itchy, watery eyes.  ENT: Denies discharge, congestion, post nasal drip, epistaxis, sore throat, earache, hearing loss, dental pain, Tinnitus, Vertigo, Sinus pain, snoring.  Cardio: Denies chest pain, palpitations, irregular heartbeat, syncope, dyspnea, diaphoresis, orthopnea, PND, claudication, edema Respiratory: denies cough, dyspnea, DOE, pleurisy, hoarseness, laryngitis, wheezing.  Gastrointestinal: Denies dysphagia, heartburn, reflux, water brash, pain, cramps, nausea, vomiting, bloating, diarrhea, constipation, hematemesis, melena, hematochezia, jaundice, hemorrhoids Genitourinary: Denies dysuria, frequency, urgency, nocturia, hesitancy, discharge, hematuria, flank pain Breast:Breast lumps, nipple discharge, bleeding.  Musculoskeletal: Denies arthralgia, myalgia, stiffness, Jt. Swelling, pain, limp, and strain/sprain. Skin: Denies puritis, rash, hives, warts, acne, eczema, changing in skin lesion Neuro: No weakness, tremor, incoordination, spasms, paresthesia, pain Psychiatric: Denies  confusion, memory loss, sensory loss Endocrine: Denies change in weight, skin, hair change, nocturia, and paresthesia, diabetic polys, visual blurring, hyper / hypo glycemic episodes.  Heme/Lymph: No excessive bleeding, bruising, enlarged lymph nodes.   Physical Exam    BP 144/80  Pulse 60  Temp 98.1 F   Resp 16  Ht 5' 4.5"   Wt 170 lb 12.8 oz   BMI 28.88 kg/m2  General Appearance: Well nourished, in no apparent distress. Eyes: PERRLA, EOMs, conjunctiva no swelling or erythema, normal fundi and vessels. Sinuses: No frontal/maxillary tenderness ENT/Mouth: EACs patent / TMs  nl. Nares clear without erythema, swelling, mucoid exudates. Oral hygiene is good. No erythema, swelling, or exudate.  Tongue normal, non-obstructing. Tonsils not swollen or erythematous. Hearing normal.  Neck: Supple, thyroid normal. No bruits, nodes or JVD. Respiratory: Respiratory effort normal.  BS equal and clear bilateral without rales, rhonci, wheezing or stridor. Cardio: Heart sounds are normal with regular rate and rhythm and no murmurs, rubs or gallops. Peripheral pulses are normal and equal bilaterally without edema. No aortic or femoral bruits. Chest: symmetric with normal excursions and percussion. Breasts: Symmetric, without lumps, nipple discharge, retractions, or fibrocystic changes.  Abdomen: Flat, soft, with bowl sounds. Nontender, no guarding, rebound, hernias, masses, or organomegaly.  Lymphatics: Non tender without lymphadenopathy.  Genitourinary:  Musculoskeletal: Full ROM all peripheral extremities, joint stability, 5/5 strength, and normal gait. Skin: Warm and dry without rashes, lesions, cyanosis, clubbing or  ecchymosis.  Neuro: Cranial nerves intact, reflexes equal bilaterally. Normal muscle tone, no cerebellar symptoms. Sensation intact.  Pysch: Awake and oriented X 3, normal affect, Insight and Judgment appropriate.   Assessment and Plan  1. Annual Screening Examination 2.  Hypertension  3. Hyperlipidemia 4. Pre Diabetes 5. Vitamin D Deficiency 6. Colon Cancer, Hx/o 7. Depression, Hx/o  Continue prudent diet as discussed, weight control, BP monitoring, regular exercise, and medications. Discussed med's effects and SE's. Screening labs and tests as requested with regular follow-up as recommended.

## 2013-09-10 NOTE — Patient Instructions (Signed)

## 2013-09-11 LAB — VITAMIN D 25 HYDROXY (VIT D DEFICIENCY, FRACTURES): VIT D 25 HYDROXY: 65 ng/mL (ref 30–89)

## 2013-09-11 LAB — LIPID PANEL
CHOL/HDL RATIO: 4.6 ratio
Cholesterol: 185 mg/dL (ref 0–200)
HDL: 40 mg/dL (ref >39)
LDL CALC: 114 mg/dL — AB (ref 0–99)
Triglycerides: 154 mg/dL — ABNORMAL HIGH (ref <150)
VLDL: 31 mg/dL (ref 0–40)

## 2013-09-11 LAB — BASIC METABOLIC PANEL WITH GFR
BUN: 10 mg/dL (ref 6–23)
CALCIUM: 9.4 mg/dL (ref 8.4–10.5)
CHLORIDE: 106 meq/L (ref 96–112)
CO2: 26 mEq/L (ref 19–32)
Creat: 0.7 mg/dL (ref 0.50–1.10)
GFR, Est African American: >89 mL/min
GFR, Est Non African American: 86 mL/min
GLUCOSE: 86 mg/dL (ref 70–99)
Potassium: 4.8 mEq/L (ref 3.5–5.3)
Sodium: 140 mEq/L (ref 135–145)

## 2013-09-11 LAB — URINALYSIS, MICROSCOPIC ONLY
Bacteria, UA: NONE SEEN
Casts: NONE SEEN
Crystals: NONE SEEN
SQUAMOUS EPITHELIAL / LPF: NONE SEEN

## 2013-09-11 LAB — HEPATIC FUNCTION PANEL
ALK PHOS: 76 U/L (ref 39–117)
ALT: 21 U/L (ref 0–35)
AST: 16 U/L (ref 0–37)
Albumin: 4.3 g/dL (ref 3.5–5.2)
BILIRUBIN DIRECT: 0.1 mg/dL (ref 0.0–0.3)
Indirect Bilirubin: 0.4 mg/dL (ref 0.2–1.2)
Total Bilirubin: 0.5 mg/dL (ref 0.2–1.2)
Total Protein: 6.5 g/dL (ref 6.0–8.3)

## 2013-09-11 LAB — HEMOGLOBIN A1C
HEMOGLOBIN A1C: 6.4 % — AB (ref <5.7)
Mean Plasma Glucose: 137 mg/dL — ABNORMAL HIGH (ref <117)

## 2013-09-11 LAB — MAGNESIUM: MAGNESIUM: 1.9 mg/dL (ref 1.5–2.5)

## 2013-09-11 LAB — MICROALBUMIN / CREATININE URINE RATIO
CREATININE, URINE: 142.7 mg/dL
MICROALB/CREAT RATIO: 19.1 mg/g (ref 0.0–30.0)
Microalb, Ur: 2.73 mg/dL — ABNORMAL HIGH (ref 0.00–1.89)

## 2013-09-11 LAB — INSULIN, FASTING: Insulin fasting, serum: 10 u[IU]/mL (ref 3–28)

## 2013-09-11 LAB — TSH: TSH: 1.448 u[IU]/mL (ref 0.350–4.500)

## 2013-09-24 ENCOUNTER — Ambulatory Visit (HOSPITAL_COMMUNITY): Payer: Medicare Other

## 2013-10-15 ENCOUNTER — Encounter (INDEPENDENT_AMBULATORY_CARE_PROVIDER_SITE_OTHER): Payer: Self-pay

## 2013-10-15 ENCOUNTER — Other Ambulatory Visit: Payer: Self-pay | Admitting: Internal Medicine

## 2013-10-15 ENCOUNTER — Ambulatory Visit (HOSPITAL_COMMUNITY)
Admission: RE | Admit: 2013-10-15 | Discharge: 2013-10-15 | Disposition: A | Discharge status: Home / Self Care | Payer: Medicare Other | Source: Ambulatory Visit | Attending: Internal Medicine | Admitting: Internal Medicine

## 2013-10-15 ENCOUNTER — Other Ambulatory Visit: Payer: Self-pay | Admitting: *Deleted

## 2013-10-15 DIAGNOSIS — Z1231 Encounter for screening mammogram for malignant neoplasm of breast: Secondary | ICD-10-CM | POA: Insufficient documentation

## 2013-10-15 DIAGNOSIS — N6019 Diffuse cystic mastopathy of unspecified breast: Secondary | ICD-10-CM

## 2013-10-15 MED ORDER — ATORVASTATIN CALCIUM 80 MG PO TABS: ORAL_TABLET | ORAL | Status: DC

## 2013-11-18 ENCOUNTER — Other Ambulatory Visit: Payer: Self-pay | Admitting: *Deleted

## 2013-11-18 MED ORDER — PAROXETINE HCL 40 MG PO TABS: ORAL_TABLET | ORAL | Status: DC

## 2013-12-10 ENCOUNTER — Ambulatory Visit: Payer: Self-pay | Admitting: Physician Assistant

## 2014-03-12 ENCOUNTER — Encounter: Payer: Self-pay | Admitting: Internal Medicine

## 2014-03-12 ENCOUNTER — Ambulatory Visit (INDEPENDENT_AMBULATORY_CARE_PROVIDER_SITE_OTHER): Payer: Medicare Other | Admitting: Internal Medicine

## 2014-03-12 VITALS — BP 140/82 | HR 80 | Temp 98.0°F | Resp 18 | Ht 65.25 in | Wt 166.0 lb

## 2014-03-12 DIAGNOSIS — R7309 Other abnormal glucose: Secondary | ICD-10-CM | POA: Diagnosis not present

## 2014-03-12 DIAGNOSIS — E559 Vitamin D deficiency, unspecified: Secondary | ICD-10-CM

## 2014-03-12 DIAGNOSIS — D497 Neoplasm of unspecified behavior of endocrine glands and other parts of nervous system: Secondary | ICD-10-CM | POA: Insufficient documentation

## 2014-03-12 DIAGNOSIS — R7303 Prediabetes: Secondary | ICD-10-CM

## 2014-03-12 DIAGNOSIS — I10 Essential (primary) hypertension: Secondary | ICD-10-CM

## 2014-03-12 DIAGNOSIS — Z79899 Other long term (current) drug therapy: Secondary | ICD-10-CM

## 2014-03-12 DIAGNOSIS — E782 Mixed hyperlipidemia: Secondary | ICD-10-CM

## 2014-03-12 DIAGNOSIS — Z23 Encounter for immunization: Secondary | ICD-10-CM | POA: Diagnosis not present

## 2014-03-12 NOTE — Progress Notes (Signed)
Patient ID: Kristen [name], female   DOB: 04/24/1941, 73 y.o.   MRN: [id number]   This very nice 73 y.o.female presents for 3 month follow up with Hypertension, Hyperlipidemia, Pre-Diabetes and Vitamin D Deficiency. Patient relate a fall by slipping ~ 2 weeks ago striking her Rt chest/breast on the sofa arm with soreness gradually improving.    Patient is treated for HTN & BP has been controlled at home. Today's BP: 140/82 mmHg. Patient has had no complaints of any cardiac type chest pain, palpitations, dyspnea/orthopnea/PND, dizziness, claudication, or dependent edema.   Hyperlipidemia is not controlled with diet & meds. Patient denies myalgias or other med SE's. Last Lipids were Total Chol 185; HDL  40; LDL  114*; Trigl 154 on 09/10/2013.   Also, the patient has history of  PreDiabetes and has had no symptoms of reactive hypoglycemia, diabetic polys, paresthesias or visual blurring.  Last A1c was  6.4% on 09/10/2013.    Further, the patient also has history of Vitamin D Deficiency (31in 2008) and supplements vitamin D without any suspected side-effects. Last vitamin D was  65 on 09/10/2013.   Medication List   aspirin 81 MG tablet  Take 81 mg by mouth daily.     atorvastatin 80 MG tablet  Commonly known as:  LIPITOR  1/2 to 1 tab daily or as directed for cholesterol     lisinopril-hydrochlorothiazide 20-12.5 MG per tablet  Commonly known as:  PRINZIDE,ZESTORETIC  Take 1 tablet by mouth daily.     PARoxetine 20 MG tablet  Commonly known as:  PAXIL  Take 20 mg by mouth daily.     ranitidine 300 MG capsule  Commonly known as:  ZANTAC  Take 300 mg by mouth at bedtime.     VITAMIN D PO  Take 2,000 Units by mouth 3 (three) times daily.     Allergies  Allergen Reactions  . Aspirin     High Doses  . Codeine Nausea And Vomiting  . Fenofibrate Cough  . Hydrochlorothiazide     Fatigue  . Metoclopramide     Sedation   PMHx:   Past Medical History  Diagnosis Date  . Shortness of  breath     exertion  . Pneumonia     hx of pneumonia  . GERD (gastroesophageal reflux disease)   . Headache(784.0)     hx migraines  . Depression   . Colon polyps   . Arthritis   . Hypertension     pt states she has not taken any b/p medication since her brain surgery in oct 3013--took herself off the medication  . Dysrhythmia 04/2012    SVT - RATE 145 POST OP CRANIOTOMY SURGERY --TX'D AND RESOLVED--PT STATES NO FURTHER PROBLEM THAT SHE IS AWARE OF -SHE HAS NOT HAD TO SEE CARDIOLOGIST  . Pituitary mass NOV 2013    MASS FOUND AFTER PT EXPERIENCED BILATERAL BLURRED / HAZY VISION -ESP LEFT EYE.  S/P CRANIOTOMY AND EYESIGHT RETURNED TO NORMAL--PT STILL HAS A LOT OF TENDERNESS RIGHT SIDE OF NOSE --THE CRANIOTOMY WAS DONE BY TRANSNASAL APPROACH  . PONV (postoperative nausea and vomiting)     PT STATES PLEASE NOTE SHE HAS TENDERNESS RIGHT SIDE OF NOSE - SINCE TRANSNASAL CRANIOTOMY 04/2012  . Colon cancer 1990s, 2014  . Vitamin D deficiency   . Prediabetes    FHx:    Reviewed / unchanged  SHx:    Reviewed / unchanged Systems Review:  Constitutional: Denies fever, chills, wt changes, headaches, insomnia, fatigue,  night sweats, change in appetite. Eyes: Denies redness, blurred vision, diplopia, discharge, itchy, watery eyes.  ENT: Denies discharge, congestion, post nasal drip, epistaxis, sore throat, earache, hearing loss, dental pain, tinnitus, vertigo, sinus pain, snoring.  CV: Denies chest pain, palpitations, irregular heartbeat, syncope, dyspnea, diaphoresis, orthopnea, PND, claudication or edema. Respiratory: denies cough, dyspnea, DOE, pleurisy, hoarseness, laryngitis, wheezing.  Gastrointestinal: Denies dysphagia, odynophagia, heartburn, reflux, water brash, abdominal pain or cramps, nausea, vomiting, bloating, diarrhea, constipation, hematemesis, melena, hematochezia  or hemorrhoids. Genitourinary: Denies dysuria, frequency, urgency, nocturia, hesitancy, discharge, hematuria or flank  pain. Musculoskeletal: Denies arthralgias, myalgias, stiffness, jt. swelling, pain, limping or strain/sprain.  Skin: Denies pruritus, rash, hives, warts, acne, eczema or change in skin lesion(s). Neuro: No weakness, tremor, incoordination, spasms, paresthesia or pain. Psychiatric: Denies confusion, memory loss or sensory loss. Endo: Denies change in weight, skin or hair change.  Heme/Lymph: No excessive bleeding, bruising or enlarged lymph nodes.  Exam:  BP 140/82  Pulse 80  Temp 98 F   Resp 18  Ht 5' 5.25"   Wt 166 lb   BMI 27.42   Appears well nourished and in no distress. Eyes: PERRLA, EOMs, conjunctiva no swelling or erythema. Sinuses: No frontal/maxillary tenderness ENT/Mouth: EAC's clear, TM's nl w/o erythema, bulging. Nares clear w/o erythema, swelling, exudates. Oropharynx clear without erythema or exudates. Oral hygiene is good. Tongue normal, non obstructing. Hearing intact.  Neck: Supple. Thyroid nl. Car 2+/2+ without bruits, nodes or JVD. Chest: Respirations nl with BS clear & equal w/o rales, rhonchi, wheezing or stridor.  Cor: Heart sounds normal w/ regular rate and rhythm without sig. murmurs, gallops, clicks, or rubs. Peripheral pulses normal and equal  without edema.  Abdomen: Soft & bowel sounds normal. Non-tender w/o guarding, rebound, hernias, masses, or organomegaly.  Lymphatics: Unremarkable.  Musculoskeletal: Full ROM all peripheral extremities, joint stability, 5/5 strength, and normal gait.  Skin: Warm, dry without exposed rashes, lesions or ecchymosis apparent.  Neuro: Cranial nerves intact, reflexes equal bilaterally. Sensory-motor testing grossly intact. Tendon reflexes grossly intact.  Pysch: Alert & oriented x 3.  Insight and judgement nl & appropriate. No ideations.  Assessment and Plan:  1. Hypertension - Continue monitor blood pressure at home. Continue diet/meds same.  2. Hyperlipidemia - Continue diet/meds, exercise,& lifestyle modifications.  Continue monitor periodic cholesterol/liver & renal functions   3.  Pre-Diabetes - Continue diet, exercise, lifestyle modifications. Monitor appropriate labs.  4. Vitamin D Deficiency - Continue supplementation.   Recommended regular exercise, BP monitoring, weight control, and discussed med and SE's. Recommended labs to assess and monitor clinical status. Further disposition pending results of labs.

## 2014-03-12 NOTE — Patient Instructions (Signed)
   Recommend the book "The END of DIETING" by Dr Joel Furman   and the book "The END of DIABETES " by Dr Joel Fuhrman  At [employer] - get book & Audio CD's      Being diabetic has a  300% increased risk for heart attack, stroke, cancer, and alzheimer- type vascular dementia. It is very important that you work harder with diet by avoiding all foods that are white except chicken & fish. Avoid white rice (Febus & wild rice is OK), white potatoes (sweetpotatoes in moderation is OK), White bread or wheat bread or anything made out of white flour like bagels, donuts, rolls, buns, biscuits, cakes, pastries, cookies, pizza crust, and pasta (made from white flour & egg whites) - vegetarian pasta or spinach or wheat pasta is OK. Multigrain breads like Arnold's or Pepperidge Farm, or multigrain sandwich thins or flatbreads.  Diet, exercise and weight loss can reverse and cure diabetes in the early stages.  Diet, exercise and weight loss is very important in the control and prevention of complications of diabetes which affects every system in your body, ie. Brain - dementia/stroke, eyes - glaucoma/blindness, heart - heart attack/heart failure, kidneys - dialysis, stomach - gastric paralysis, intestines - malabsorption, nerves - severe painful neuritis, circulation - gangrene & loss of a leg(s), and finally cancer and Alzheimers.    I recommend avoid fried & greasy foods,  sweets/candy, white rice (Milberger or wild rice or Quinoa is OK), white potatoes (sweet potatoes are OK) - anything made from white flour - bagels, doughnuts, rolls, buns, biscuits,white and wheat breads, pizza crust and traditional pasta made of white flour & egg white(vegetarian pasta or spinach or wheat pasta is OK).  Multi-grain bread is OK - like multi-grain flat bread or sandwich thins. Avoid alcohol in excess. Exercise is also important.    Eat all the vegetables you want - avoid meat, especially red meat and dairy - especially cheese.  Cheese  is the most concentrated form of trans-fats which is the worst thing to clog up our arteries. Veggie cheese is OK which can be found in the fresh produce section at [employer] or [employer] or [employer]   

## 2014-03-13 LAB — CBC WITH DIFFERENTIAL/PLATELET
Basophils Absolute: 0 10*3/uL (ref 0.0–0.1)
Basophils Relative: 0 % (ref 0–1)
EOS ABS: 0.1 10*3/uL (ref 0.0–0.7)
EOS PCT: 2 % (ref 0–5)
HEMATOCRIT: 37.2 % (ref 36.0–46.0)
Hemoglobin: 12.5 g/dL (ref 12.0–15.0)
LYMPHS ABS: 1.9 10*3/uL (ref 0.7–4.0)
Lymphocytes Relative: 30 % (ref 12–46)
MCH: 28.4 pg (ref 26.0–34.0)
MCHC: 33.6 g/dL (ref 30.0–36.0)
MCV: 84.5 fL (ref 78.0–100.0)
MONO ABS: 0.3 10*3/uL (ref 0.1–1.0)
Monocytes Relative: 5 % (ref 3–12)
Neutro Abs: 4 10*3/uL (ref 1.7–7.7)
Neutrophils Relative %: 63 % (ref 43–77)
Platelets: 271 10*3/uL (ref 150–400)
RBC: 4.4 MIL/uL (ref 3.87–5.11)
RDW: 14 % (ref 11.5–15.5)
WBC: 6.3 10*3/uL (ref 4.0–10.5)

## 2014-03-13 LAB — BASIC METABOLIC PANEL WITH GFR
BUN: 15 mg/dL (ref 6–23)
CALCIUM: 9.6 mg/dL (ref 8.4–10.5)
CO2: 29 meq/L (ref 19–32)
CREATININE: 0.66 mg/dL (ref 0.50–1.10)
Chloride: 104 mEq/L (ref 96–112)
GFR, Est African American: >89 mL/min
GFR, Est Non African American: 88 mL/min
GLUCOSE: 90 mg/dL (ref 70–99)
Potassium: 4.2 mEq/L (ref 3.5–5.3)
Sodium: 141 mEq/L (ref 135–145)

## 2014-03-13 LAB — HEPATIC FUNCTION PANEL
ALBUMIN: 4.4 g/dL (ref 3.5–5.2)
ALT: 19 U/L (ref 0–35)
AST: 17 U/L (ref 0–37)
Alkaline Phosphatase: 91 U/L (ref 39–117)
Bilirubin, Direct: 0.1 mg/dL (ref 0.0–0.3)
Indirect Bilirubin: 0.5 mg/dL (ref 0.2–1.2)
Total Bilirubin: 0.6 mg/dL (ref 0.2–1.2)
Total Protein: 7.1 g/dL (ref 6.0–8.3)

## 2014-03-13 LAB — MAGNESIUM: Magnesium: 1.9 mg/dL (ref 1.5–2.5)

## 2014-03-13 LAB — HEMOGLOBIN A1C
Hgb A1c MFr Bld: 6.1 % — ABNORMAL HIGH (ref <5.7)
Mean Plasma Glucose: 128 mg/dL — ABNORMAL HIGH (ref <117)

## 2014-03-13 LAB — LIPID PANEL
Cholesterol: 199 mg/dL (ref 0–200)
HDL: 53 mg/dL (ref >39)
LDL CALC: 121 mg/dL — AB (ref 0–99)
Total CHOL/HDL Ratio: 3.8 Ratio
Triglycerides: 126 mg/dL (ref <150)
VLDL: 25 mg/dL (ref 0–40)

## 2014-03-13 LAB — INSULIN, FASTING: INSULIN FASTING, SERUM: 7.8 u[IU]/mL (ref 2.0–19.6)

## 2014-03-13 LAB — VITAMIN D 25 HYDROXY (VIT D DEFICIENCY, FRACTURES): Vit D, 25-Hydroxy: 82 ng/mL (ref 30–89)

## 2014-03-13 LAB — TSH: TSH: 1.644 u[IU]/mL (ref 0.350–4.500)

## 2014-03-20 ENCOUNTER — Other Ambulatory Visit: Payer: Self-pay | Admitting: *Deleted

## 2014-03-20 MED ORDER — PAROXETINE HCL 20 MG PO TABS
20.0000 mg | ORAL_TABLET | Freq: Every day | ORAL | Status: DC
Start: 2014-03-20 — End: 2014-10-09

## 2014-03-20 MED ORDER — LISINOPRIL-HYDROCHLOROTHIAZIDE 20-12.5 MG PO TABS: 1.0000 | ORAL_TABLET | Freq: Every day | ORAL | Status: DC

## 2014-06-14 ENCOUNTER — Encounter: Payer: Self-pay | Admitting: *Deleted

## 2014-06-19 ENCOUNTER — Ambulatory Visit: Payer: Self-pay | Admitting: Physician Assistant

## 2014-06-19 ENCOUNTER — Encounter: Payer: Self-pay | Admitting: Internal Medicine

## 2014-08-18 ENCOUNTER — Emergency Department (HOSPITAL_COMMUNITY): Payer: Medicare Other

## 2014-08-18 ENCOUNTER — Observation Stay (HOSPITAL_COMMUNITY)
Admission: EM | Admit: 2014-08-18 | Discharge: 2014-08-20 | Disposition: A | Discharge status: Home / Self Care | Payer: Medicare Other | Attending: Internal Medicine | Admitting: Internal Medicine

## 2014-08-18 ENCOUNTER — Encounter (HOSPITAL_COMMUNITY): Payer: Self-pay | Admitting: Emergency Medicine

## 2014-08-18 DIAGNOSIS — K219 Gastro-esophageal reflux disease without esophagitis: Secondary | ICD-10-CM | POA: Insufficient documentation

## 2014-08-18 DIAGNOSIS — R42 Dizziness and giddiness: Secondary | ICD-10-CM | POA: Diagnosis not present

## 2014-08-18 DIAGNOSIS — Z885 Allergy status to narcotic agent status: Secondary | ICD-10-CM | POA: Diagnosis not present

## 2014-08-18 DIAGNOSIS — F329 Major depressive disorder, single episode, unspecified: Secondary | ICD-10-CM | POA: Diagnosis not present

## 2014-08-18 DIAGNOSIS — R634 Abnormal weight loss: Secondary | ICD-10-CM | POA: Insufficient documentation

## 2014-08-18 DIAGNOSIS — Z9049 Acquired absence of other specified parts of digestive tract: Secondary | ICD-10-CM | POA: Insufficient documentation

## 2014-08-18 DIAGNOSIS — R55 Syncope and collapse: Secondary | ICD-10-CM | POA: Diagnosis not present

## 2014-08-18 DIAGNOSIS — R404 Transient alteration of awareness: Secondary | ICD-10-CM | POA: Diagnosis not present

## 2014-08-18 DIAGNOSIS — W57XXXA Bitten or stung by nonvenomous insect and other nonvenomous arthropods, initial encounter: Secondary | ICD-10-CM

## 2014-08-18 DIAGNOSIS — Z85841 Personal history of malignant neoplasm of brain: Secondary | ICD-10-CM | POA: Diagnosis not present

## 2014-08-18 DIAGNOSIS — G43909 Migraine, unspecified, not intractable, without status migrainosus: Secondary | ICD-10-CM | POA: Diagnosis not present

## 2014-08-18 DIAGNOSIS — Z7982 Long term (current) use of aspirin: Secondary | ICD-10-CM | POA: Insufficient documentation

## 2014-08-18 DIAGNOSIS — Z8701 Personal history of pneumonia (recurrent): Secondary | ICD-10-CM | POA: Diagnosis not present

## 2014-08-18 DIAGNOSIS — Z86010 Personal history of colonic polyps: Secondary | ICD-10-CM | POA: Diagnosis not present

## 2014-08-18 DIAGNOSIS — Z85038 Personal history of other malignant neoplasm of large intestine: Secondary | ICD-10-CM | POA: Insufficient documentation

## 2014-08-18 DIAGNOSIS — D352 Benign neoplasm of pituitary gland: Secondary | ICD-10-CM

## 2014-08-18 DIAGNOSIS — D497 Neoplasm of unspecified behavior of endocrine glands and other parts of nervous system: Secondary | ICD-10-CM | POA: Diagnosis present

## 2014-08-18 DIAGNOSIS — G9389 Other specified disorders of brain: Secondary | ICD-10-CM | POA: Diagnosis not present

## 2014-08-18 DIAGNOSIS — T148 Other injury of unspecified body region: Secondary | ICD-10-CM

## 2014-08-18 DIAGNOSIS — E559 Vitamin D deficiency, unspecified: Secondary | ICD-10-CM | POA: Insufficient documentation

## 2014-08-18 DIAGNOSIS — Z6826 Body mass index (BMI) 26.0-26.9, adult: Secondary | ICD-10-CM | POA: Insufficient documentation

## 2014-08-18 DIAGNOSIS — R7309 Other abnormal glucose: Secondary | ICD-10-CM | POA: Diagnosis not present

## 2014-08-18 DIAGNOSIS — I10 Essential (primary) hypertension: Secondary | ICD-10-CM | POA: Diagnosis not present

## 2014-08-18 DIAGNOSIS — R7303 Prediabetes: Secondary | ICD-10-CM | POA: Diagnosis present

## 2014-08-18 LAB — URINALYSIS, ROUTINE W REFLEX MICROSCOPIC
Bilirubin Urine: NEGATIVE
Glucose, UA: NEGATIVE mg/dL
Hgb urine dipstick: NEGATIVE
KETONES UR: NEGATIVE mg/dL
NITRITE: NEGATIVE
Protein, ur: NEGATIVE mg/dL
SPECIFIC GRAVITY, URINE: 1.011 (ref 1.005–1.030)
UROBILINOGEN UA: 0.2 mg/dL (ref 0.0–1.0)
pH: 7 (ref 5.0–8.0)

## 2014-08-18 LAB — BASIC METABOLIC PANEL
Anion gap: 12 (ref 5–15)
BUN: 13 mg/dL (ref 6–23)
CALCIUM: 9.7 mg/dL (ref 8.4–10.5)
CO2: 25 mmol/L (ref 19–32)
CREATININE: 0.77 mg/dL (ref 0.50–1.10)
Chloride: 102 mmol/L (ref 96–112)
GFR calc Af Amer: >90 mL/min (ref >90)
GFR calc non Af Amer: 81 mL/min — ABNORMAL LOW (ref >90)
GLUCOSE: 93 mg/dL (ref 70–99)
Potassium: 3.9 mmol/L (ref 3.5–5.1)
Sodium: 139 mmol/L (ref 135–145)

## 2014-08-18 LAB — CBC WITH DIFFERENTIAL/PLATELET
BASOS ABS: 0 10*3/uL (ref 0.0–0.1)
BASOS PCT: 0 % (ref 0–1)
EOS ABS: 0.2 10*3/uL (ref 0.0–0.7)
EOS PCT: 2 % (ref 0–5)
HCT: 40 % (ref 36.0–46.0)
HEMOGLOBIN: 13 g/dL (ref 12.0–15.0)
Lymphocytes Relative: 25 % (ref 12–46)
Lymphs Abs: 1.8 10*3/uL (ref 0.7–4.0)
MCH: 28.6 pg (ref 26.0–34.0)
MCHC: 32.5 g/dL (ref 30.0–36.0)
MCV: 87.9 fL (ref 78.0–100.0)
MONO ABS: 0.4 10*3/uL (ref 0.1–1.0)
Monocytes Relative: 6 % (ref 3–12)
Neutro Abs: 5 10*3/uL (ref 1.7–7.7)
Neutrophils Relative %: 67 % (ref 43–77)
Platelets: 246 10*3/uL (ref 150–400)
RBC: 4.55 MIL/uL (ref 3.87–5.11)
RDW: 13 % (ref 11.5–15.5)
WBC: 7.4 10*3/uL (ref 4.0–10.5)

## 2014-08-18 LAB — I-STAT TROPONIN, ED
TROPONIN I, POC: 0 ng/mL (ref 0.00–0.08)
TROPONIN I, POC: 0 ng/mL (ref 0.00–0.08)

## 2014-08-18 LAB — URINE MICROSCOPIC-ADD ON

## 2014-08-18 MED ORDER — SODIUM CHLORIDE 0.9 % IV BOLUS (SEPSIS)
1000.0000 mL | Freq: Once | INTRAVENOUS | Status: AC
  Administered 2014-08-18: 1000 mL via INTRAVENOUS

## 2014-08-18 MED ORDER — IOHEXOL 350 MG/ML SOLN
50.0000 mL | Freq: Once | INTRAVENOUS | Status: AC | PRN
  Administered 2014-08-18: 50 mL via INTRAVENOUS

## 2014-08-18 NOTE — ED Notes (Signed)
Pt back from CT

## 2014-08-18 NOTE — ED Provider Notes (Signed)
CSN: [id number]     Arrival date & time 08/18/14  1502 History   First MD Initiated Contact with Patient 08/18/14 1624     Chief Complaint  Patient presents with  . Near Syncope     (Consider location/radiation/quality/duration/timing/severity/associated sxs/prior Treatment) Patient is a 74 y.o. female presenting with syncope.  Loss of Consciousness Episode history:  Multiple Most recent episode:  Today Duration: very brief. Timing: 2x. Progression:  Partially resolved Chronicity:  New Context comment:  While bending over, question worsening on standing Witnessed: no   Relieved by:  Nothing Worsened by:  Nothing tried Associated symptoms: no chest pain, no diaphoresis, no dizziness, no focal weakness, no headaches, no nausea, no recent fall, no seizures, no shortness of breath and no vomiting     Past Medical History  Diagnosis Date  . Shortness of breath     exertion  . Pneumonia     hx of pneumonia  . GERD (gastroesophageal reflux disease)   . Headache(784.0)     hx migraines  . Depression   . Colon polyps   . Arthritis   . Hypertension     pt states she has not taken any b/p medication since her brain surgery in oct 3013--took herself off the medication  . Dysrhythmia 04/2012    SVT - RATE 145 POST OP CRANIOTOMY SURGERY --TX'D AND RESOLVED--PT STATES NO FURTHER PROBLEM THAT SHE IS AWARE OF -SHE HAS NOT HAD TO SEE CARDIOLOGIST  . Pituitary mass NOV 2013    MASS FOUND AFTER PT EXPERIENCED BILATERAL BLURRED / HAZY VISION -ESP LEFT EYE.  S/P CRANIOTOMY AND EYESIGHT RETURNED TO NORMAL--PT STILL HAS A LOT OF TENDERNESS RIGHT SIDE OF NOSE --THE CRANIOTOMY WAS DONE BY TRANSNASAL APPROACH  . PONV (postoperative nausea and vomiting)     PT STATES PLEASE NOTE SHE HAS TENDERNESS RIGHT SIDE OF NOSE - SINCE TRANSNASAL CRANIOTOMY 04/2012  . Colon cancer 1990s, 2014  . Vitamin D deficiency   . Prediabetes    Past Surgical History  Procedure Laterality Date  . Colon resection   1988  . Colonoscopy  09/20/2011    Procedure: COLONOSCOPY;  Surgeon: Missy Sabins, MD;  Location: [facility];  Service: Endoscopy;  Laterality: N/A;  . Hot hemostasis  09/20/2011    Procedure: HOT HEMOSTASIS (ARGON PLASMA COAGULATION/BICAP);  Surgeon: Missy Sabins, MD;  Location: [facility];  Service: Endoscopy;  Laterality: N/A;  . Cataract extraction  June/july 2013    bilateral eyes  . Colonoscopy  12/15/2011    Procedure: COLONOSCOPY;  Surgeon: Missy Sabins, MD;  Location: [facility];  Service: Endoscopy;  Laterality: N/A;  . Colonoscopy  01/26/2012    Procedure: COLONOSCOPY;  Surgeon: Cleotis Nipper, MD;  Location: [facility];  Service: Endoscopy;  Laterality: N/A;  . Colon surgery    . Eye surgery      cataract bil  . Tumor removal      Left leg  . Back surgery    . Craniotomy  04/18/2012    Procedure: CRANIOTOMY HYPOPHYSECTOMY TRANSNASAL APPROACH;  Surgeon: Ophelia Charter, MD;  Location: [facility];  Service: Neurosurgery;  Laterality: Bilateral;  Transphenoidal resection of Pituitary tumor.   . Transnasal approach  04/18/2012    Procedure: TRANSNASAL APPROACH;  Surgeon: Rozetta Nunnery, MD;  Location: [facility];  Service: ENT;  Laterality: Bilateral;  Transnasal approach  . Laparoscopic right hemi colectomy  06/27/2012    Procedure: LAPAROSCOPIC RIGHT HEMI COLECTOMY;  Surgeon: Rodman Key  Verdie Drown, MD;  Location: [facility];  Service: General;  Laterality: N/A;  Laparoscopic Assited Right Hemicolectomy   Family History  Problem Relation Age of Onset  . Colon cancer Sister 39  . Heart disease Mother   . Heart disease Father   . Cancer Maternal Aunt     unknown form of cancer dx in her 27s-50s  . Liver cancer Maternal Uncle     heavy ETOH user  . Breast cancer Sister 58  . Dementia Sister   . Colon cancer Cousin   . Colon polyps Son   . Colon polyps Son   . Colon cancer Other 30  . Colon polyps Other   . Brain cancer Cousin     dx in his 51s; paternal cousin    History  Substance Use Topics  . Smoking status: Never Smoker   . Smokeless tobacco: Not on file  . Alcohol Use: No     Comment: a couple times a year, one drink at a time   OB History    No data available     Review of Systems  Constitutional: Negative for diaphoresis.  Respiratory: Negative for shortness of breath.   Cardiovascular: Positive for syncope. Negative for chest pain.  Gastrointestinal: Negative for nausea and vomiting.  Neurological: Negative for dizziness, focal weakness, seizures and headaches.  All other systems reviewed and are negative.     Allergies  Aspirin; Codeine; Fenofibrate; Hydrochlorothiazide; and Metoclopramide  Home Medications   Prior to Admission medications   Medication Sig Start Date End Date Taking? Authorizing Provider  aspirin 81 MG tablet Take 81 mg by mouth daily.   Yes Historical Provider, MD  atorvastatin (LIPITOR) 80 MG tablet 1/2 to 1 tab daily or as directed for cholesterol 10/15/13  Yes Unk Pinto, MD  Cholecalciferol (VITAMIN D PO) Take 2,000 Units by mouth 3 (three) times daily.   Yes Historical Provider, MD  lisinopril-hydrochlorothiazide (PRINZIDE,ZESTORETIC) 20-12.5 MG per tablet Take 1 tablet by mouth daily. 03/20/14  Yes Unk Pinto, MD  PARoxetine (PAXIL) 20 MG tablet Take 1 tablet (20 mg total) by mouth daily. Patient taking differently: Take 10 mg by mouth daily.  03/20/14  Yes Unk Pinto, MD  ranitidine (ZANTAC) 300 MG capsule Take 300 mg by mouth daily as needed for heartburn.    Yes Historical Provider, MD   BP 148/69 mmHg  Pulse 92  Temp(Src) 98.1 F (36.7 C) (Oral)  Resp 17  Ht 5\' 6"  (1.676 m)  Wt 167 lb 14.4 oz (76.159 kg)  BMI 27.11 kg/m2  SpO2 100% Physical Exam  Constitutional: She is oriented to person, place, and time. She appears well-developed and well-nourished.  HENT:  Head: Normocephalic and atraumatic.  Right Ear: External ear normal.  Left Ear: External ear normal.  Eyes:  Conjunctivae and EOM are normal. Pupils are equal, round, and reactive to light.  Neck: Normal range of motion. Neck supple.  Cardiovascular: Normal rate, regular rhythm, normal heart sounds and intact distal pulses.   Pulmonary/Chest: Effort normal and breath sounds normal.  Abdominal: Soft. Bowel sounds are normal. There is no tenderness.  Musculoskeletal: Normal range of motion.  Neurological: She is alert and oriented to person, place, and time. She has normal strength and normal reflexes. No cranial nerve deficit or sensory deficit.  Skin: Skin is warm and dry.  Vitals reviewed.   ED Course  Procedures (including critical care time) Labs Review Labs Reviewed  BASIC METABOLIC PANEL - Abnormal; Notable for the following:  GFR calc non Af Amer 81 (*)    All other components within normal limits  URINALYSIS, ROUTINE W REFLEX MICROSCOPIC - Abnormal; Notable for the following:    Leukocytes, UA TRACE (*)    All other components within normal limits  CBC WITH DIFFERENTIAL/PLATELET  URINE MICROSCOPIC-ADD ON  I-STAT TROPOININ, ED  I-STAT TROPOININ, ED    Imaging Review Ct Angio Head W/cm &/or Wo Cm  08/18/2014   CLINICAL DATA:  Near syncope x2 today while working outside. Vision became black. Initial encounter.  EXAM: CT ANGIOGRAPHY HEAD  TECHNIQUE: Multidetector CT imaging of the head was performed using the standard protocol during bolus administration of intravenous contrast. Multiplanar CT image reconstructions and MIPs were obtained to evaluate the vascular anatomy.  CONTRAST:  40mL OMNIPAQUE IOHEXOL 350 MG/ML SOLN  COMPARISON:  CT head earlier today.  FINDINGS: CTA HEAD  Anterior circulation: No significant stenosis, proximal occlusion, aneurysm, or vascular malformation.  Posterior circulation: No significant stenosis, proximal occlusion, aneurysm, or vascular malformation.  Venous sinuses: As permitted by contrast timing, patent.  Anatomic variants: None of significance.  Delayed  phase: There is mild enhancement of the 1 cm sized intrasellar lesion, slightly eccentric to the LEFT. The patient has undergone previous trans-sphenoidal resection for tumor. Findings are consistent with recurrent pituitary adenoma. No evidence for arterial signature to suggest aneurysm.  IMPRESSION: Findings consistent with recurrent pituitary macroadenoma. No evidence for arterial signature on CTA to suggest a berry aneurysm.  No intracranial flow reducing lesion, berry aneurysm, or venous sinus thrombosis.  Nonemergent MRI brain pituitary protocol is recommended for further evaluation.   Electronically Signed   By: Rolla Flatten M.D.   On: 08/18/2014 21:00   Dg Chest 2 View  08/18/2014   CLINICAL DATA:  Initial evaluation for near syncope, dizziness, left leg stiffness  EXAM: CHEST  2 VIEW  COMPARISON:  06/20/2012  FINDINGS: Heart size and vascular pattern are normal. Lungs are clear. A snap projects over the left upper lobe. No effusion or pneumothorax.  IMPRESSION: No active cardiopulmonary disease.   Electronically Signed   By: Skipper Cliche M.D.   On: 08/18/2014 16:10   Ct Head Wo Contrast  08/18/2014   CLINICAL DATA:  Near-syncope x2 today.  EXAM: CT HEAD WITHOUT CONTRAST  TECHNIQUE: Contiguous axial images were obtained from the base of the skull through the vertex without intravenous contrast.  COMPARISON:  Pituitary protocol MR 03/30/2012.  FINDINGS: There is a hyperdense focus in the sella turcica measuring 1.1 by 1.0 cm on image 10. The brain otherwise appears normal without hemorrhage, infarct, midline shift or abnormal extra-axial fluid collection. No hydrocephalus or pneumocephalus. The calvarium is intact. Imaged paranasal sinuses and mastoid air cells are clear.  IMPRESSION: Hyper dense focus in the sella turcica is worrisome for recurrent pituitary adenoma. Aneurysm is possible but much less likely. Nonemergent pituitary protocol MRI is recommended for further evaluation. The patient's  examination is otherwise negative.   Electronically Signed   By: Inge Rise M.D.   On: 08/18/2014 18:44     EKG Interpretation   Date/Time:  Monday August 18 2014 15:27:27 EDT Ventricular Rate:  54 PR Interval:  192 QRS Duration: 84 QT Interval:  416 QTC Calculation: 394 R Axis:   22 Text Interpretation:  Sinus bradycardia with sinus arrhythmia Low voltage  QRS Cannot rule out Anterior infarct , age undetermined Abnormal ECG rate  has decreased since last tracing Confirmed by Debby Freiberg [phone]) on  08/18/2014 4:46:07 PM  MDM   Final diagnoses:  Syncope  Syncope    74 y.o. female with pertinent PMH of prior brain tumor presents with brief syncopal episode as above.  On my exam pt has isolated complaint of fatigue.  Exam unremarkable, no focal neuro deficits.    Wu unremarkable.  Given age and relative paucity of antecedent symptoms or clear etiology, admitted in stable condition  I have reviewed all laboratory and imaging studies if ordered as above  1. Syncope   2. Syncope         Debby Freiberg, MD 08/19/14 [phone]

## 2014-08-18 NOTE — ED Notes (Signed)
Admitting MD at bedside.

## 2014-08-18 NOTE — ED Notes (Signed)
Pt here for near syncope x 2 today while working outside; pt sts vision went black and was dizzy for a couple of seconds x 2 episode

## 2014-08-18 NOTE — H&P (Signed)
PCP: Alesia Richards, MD  [facility]  Chief Complaint:  syncope  HPI: Kristen [name] is a 74 y.o. female   has a past medical history of Shortness of breath; Pneumonia; GERD (gastroesophageal reflux disease); Headache(784.0); Depression; Colon polyps; Arthritis; Hypertension; Dysrhythmia (04/2012); Pituitary mass (NOV 2013); PONV (postoperative nausea and vomiting); Colon cancer (1990s, 2014); Vitamin D deficiency; and Prediabetes.   Issue and has history of transverse colon cancer diagnosed in 1999 at [place] post resection she's been followed by GI in the last colonoscopy done in August 2013. She was found to have anomalous polyp at that time which had to be resected with biopsy showing evidence of tubovillous adenoma which was resected. Patient also has history of suprasellar lesion consistent with pituitary treated normal she status post transsphenoidal resection by Dr. Arnoldo Morale and Dr. Lucia Gaskins in November 2013 she presented to emergency department after she was found down by her grandson she cannot recollect any syncope. Patient was pulling grass bent over.  She tried to stand up but had to grab a post. Patient adamant that she did not passed out. She was brought to ER During evaluation in emergency department she was found to have Hyperdense focus in the sella turcica is worrisome for recurrent pituitary adenoma. This was discussed with neurology who recommends outpatient follow-up.  Of note: patient had a tick bite yesterday. Also over past year she has lost 30-50 lbs. Her last colonoscopy was in 2015. She has had regular mammograms last was 10/2013.  EKG is unremarkable troponin is unremarkable 2 patient is being admitted for further evaluation Hospitalist was called for admission for syncope  Review of Systems:    Pertinent positives include:  Syncope headaches,   Constitutional:  No weight loss, night sweats, Fevers, chills, fatigue, weight loss diarrhea reports low stools  ever since her surgery HEENT:  No Difficulty swallowing,Tooth/dental problems,Sore throat,  No sneezing, itching, ear ache, nasal congestion, post nasal drip,  Cardio-vascular:  No chest pain, Orthopnea, PND, anasarca, dizziness, palpitations.no Bilateral lower extremity swelling  GI:  No heartburn, indigestion, abdominal pain, nausea, vomiting, change in bowel habits, loss of appetite, melena, blood in stool, hematemesis Resp:  no shortness of breath at rest. No dyspnea on exertion, No excess mucus, no productive cough, No non-productive cough, No coughing up of blood.No change in color of mucus.No wheezing. Skin:  no rash or lesions. No jaundice GU:  no dysuria, change in color of urine, no urgency or frequency. No straining to urinate.  No flank pain.  Musculoskeletal:  No joint pain or no joint swelling. No decreased range of motion. No back pain.  Psych:  No change in mood or affect. No depression or anxiety. No memory loss.  Neuro: no localizing neurological complaints, no tingling, no weakness, no double vision, no gait abnormality, no slurred speech, no confusion  Otherwise ROS are negative except for above, 10 systems were reviewed  Past Medical History: Past Medical History  Diagnosis Date  . Shortness of breath     exertion  . Pneumonia     hx of pneumonia  . GERD (gastroesophageal reflux disease)   . Headache(784.0)     hx migraines  . Depression   . Colon polyps   . Arthritis   . Hypertension     pt states she has not taken any b/p medication since her brain surgery in oct 3013--took herself off the medication  . Dysrhythmia 04/2012    SVT - RATE 145 POST OP CRANIOTOMY SURGERY --  TX'D AND RESOLVED--PT STATES NO FURTHER PROBLEM THAT SHE IS AWARE OF -SHE HAS NOT HAD TO SEE CARDIOLOGIST  . Pituitary mass NOV 2013    MASS FOUND AFTER PT EXPERIENCED BILATERAL BLURRED / HAZY VISION -ESP LEFT EYE.  S/P CRANIOTOMY AND EYESIGHT RETURNED TO NORMAL--PT STILL HAS A LOT OF  TENDERNESS RIGHT SIDE OF NOSE --THE CRANIOTOMY WAS DONE BY TRANSNASAL APPROACH  . PONV (postoperative nausea and vomiting)     PT STATES PLEASE NOTE SHE HAS TENDERNESS RIGHT SIDE OF NOSE - SINCE TRANSNASAL CRANIOTOMY 04/2012  . Colon cancer 1990s, 2014  . Vitamin D deficiency   . Prediabetes    Past Surgical History  Procedure Laterality Date  . Colon resection  1988  . Colonoscopy  09/20/2011    Procedure: COLONOSCOPY;  Surgeon: Missy Sabins, MD;  Location: [facility];  Service: Endoscopy;  Laterality: N/A;  . Hot hemostasis  09/20/2011    Procedure: HOT HEMOSTASIS (ARGON PLASMA COAGULATION/BICAP);  Surgeon: Missy Sabins, MD;  Location: [facility];  Service: Endoscopy;  Laterality: N/A;  . Cataract extraction  June/july 2013    bilateral eyes  . Colonoscopy  12/15/2011    Procedure: COLONOSCOPY;  Surgeon: Missy Sabins, MD;  Location: [facility];  Service: Endoscopy;  Laterality: N/A;  . Colonoscopy  01/26/2012    Procedure: COLONOSCOPY;  Surgeon: Cleotis Nipper, MD;  Location: [facility];  Service: Endoscopy;  Laterality: N/A;  . Colon surgery    . Eye surgery      cataract bil  . Tumor removal      Left leg  . Back surgery    . Craniotomy  04/18/2012    Procedure: CRANIOTOMY HYPOPHYSECTOMY TRANSNASAL APPROACH;  Surgeon: Ophelia Charter, MD;  Location: [facility];  Service: Neurosurgery;  Laterality: Bilateral;  Transphenoidal resection of Pituitary tumor.   . Transnasal approach  04/18/2012    Procedure: TRANSNASAL APPROACH;  Surgeon: Rozetta Nunnery, MD;  Location: [facility];  Service: ENT;  Laterality: Bilateral;  Transnasal approach  . Laparoscopic right hemi colectomy  06/27/2012    Procedure: LAPAROSCOPIC RIGHT HEMI COLECTOMY;  Surgeon: Pedro Earls, MD;  Location: [facility];  Service: General;  Laterality: N/A;  Laparoscopic Assited Right Hemicolectomy     Medications: Prior to Admission medications   Medication Sig Start Date End Date Taking?  Authorizing Provider  aspirin 81 MG tablet Take 81 mg by mouth daily.   Yes Historical Provider, MD  atorvastatin (LIPITOR) 80 MG tablet 1/2 to 1 tab daily or as directed for cholesterol 10/15/13  Yes Unk Pinto, MD  Cholecalciferol (VITAMIN D PO) Take 2,000 Units by mouth 3 (three) times daily.   Yes Historical Provider, MD  lisinopril-hydrochlorothiazide (PRINZIDE,ZESTORETIC) 20-12.5 MG per tablet Take 1 tablet by mouth daily. 03/20/14  Yes Unk Pinto, MD  PARoxetine (PAXIL) 20 MG tablet Take 1 tablet (20 mg total) by mouth daily. Patient taking differently: Take 10 mg by mouth daily.  03/20/14  Yes Unk Pinto, MD  ranitidine (ZANTAC) 300 MG capsule Take 300 mg by mouth at bedtime.    Yes Historical Provider, MD    Allergies:   Allergies  Allergen Reactions  . Aspirin     High Doses  . Codeine Nausea And Vomiting  . Fenofibrate Cough  . Hydrochlorothiazide     Fatigue  . Metoclopramide     Sedation    Social History:  Ambulatory  independently   Lives at home  With family  reports that she has never smoked. She does not have any smokeless tobacco history on file. She reports that she does not drink alcohol or use illicit drugs.    Family History: family history includes Brain cancer in her cousin; Breast cancer (age of onset: 4) in her sister; Cancer in her maternal aunt; Colon cancer in her cousin; Colon cancer (age of onset: 87) in her other; Colon cancer (age of onset: 76) in her sister; Colon polyps in her other, son, and son; Dementia in her sister; Heart disease in her father and mother; Liver cancer in her maternal uncle.    Physical Exam: Patient Vitals for the past 24 hrs:  BP Temp Temp src Pulse Resp SpO2 Height Weight  08/18/14 2230 135/72 mmHg - - 89 15 99 % - -  08/18/14 2200 155/76 mmHg - - (!) 54 14 98 % - -  08/18/14 2130 163/98 mmHg - - 63 16 99 % - -  08/18/14 2058 173/73 mmHg - - (!) 48 14 97 % - -  08/18/14 2000 142/65 mmHg - - (!)  51 13 99 % - -  08/18/14 1930 141/71 mmHg - - (!) 54 17 100 % - -  08/18/14 1826 - - - 70 11 97 % - -  08/18/14 1824 170/92 mmHg - - - - - - -  08/18/14 1800 159/76 mmHg - - (!) 51 15 100 % - -  08/18/14 1745 167/84 mmHg - - (!) 58 19 100 % - -  08/18/14 1715 168/73 mmHg - - 63 20 98 % - -  08/18/14 1645 169/94 mmHg - - 66 14 100 % - -  08/18/14 1635 162/82 mmHg - - (!) 53 10 100 % - -  08/18/14 1531 152/70 mmHg 98.1 F (36.7 C) Oral 67 18 98 % 5\' 6"  (1.676 m) 76.159 kg (167 lb 14.4 oz)    1. General:  in No Acute distress 2. Psychological: Alert and Oriented 3. Head/ENT:   Moist  Mucous Membranes                          Head Non traumatic, neck supple                          Normal   Dentition 4. SKIN:  decreased Skin turgor,  Skin clean Dry and intact no rash 5. Heart: Regular rate and rhythm no Murmur, Rub or gallop 6. Lungs: Clear to auscultation bilaterally, no wheezes or crackles   7. Abdomen: Soft, non-tender, Non distended 8. Lower extremities: no clubbing, cyanosis, or edema 9. Neurologically Grossly intact, moving all 4 extremities equally 10. MSK: Normal range of motion  body mass index is 27.11 kg/(m^2).   Labs on Admission:   Results for orders placed or performed during the hospital encounter of 08/18/14 (from the past 24 hour(s))  Basic metabolic panel     Status: Abnormal   Collection Time: 08/18/14  3:27 PM  Result Value Ref Range   Sodium 139 135 - 145 mmol/L   Potassium 3.9 3.5 - 5.1 mmol/L   Chloride 102 96 - 112 mmol/L   CO2 25 19 - 32 mmol/L   Glucose, Bld 93 70 - 99 mg/dL   BUN 13 6 - 23 mg/dL   Creatinine, Ser 0.77 0.50 - 1.10 mg/dL   Calcium 9.7 8.4 - 10.5 mg/dL   GFR calc non Af Amer 81 (  L) >90 mL/min   GFR calc Af Amer >90 >90 mL/min   Anion gap 12 5 - 15  CBC with Differential     Status: None   Collection Time: 08/18/14  3:27 PM  Result Value Ref Range   WBC 7.4 4.0 - 10.5 K/uL   RBC 4.55 3.87 - 5.11 MIL/uL   Hemoglobin 13.0 12.0 -  15.0 g/dL   HCT 40.0 36.0 - 46.0 %   MCV 87.9 78.0 - 100.0 fL   MCH 28.6 26.0 - 34.0 pg   MCHC 32.5 30.0 - 36.0 g/dL   RDW 13.0 11.5 - 15.5 %   Platelets 246 150 - 400 K/uL   Neutrophils Relative % 67 43 - 77 %   Neutro Abs 5.0 1.7 - 7.7 K/uL   Lymphocytes Relative 25 12 - 46 %   Lymphs Abs 1.8 0.7 - 4.0 K/uL   Monocytes Relative 6 3 - 12 %   Monocytes Absolute 0.4 0.1 - 1.0 K/uL   Eosinophils Relative 2 0 - 5 %   Eosinophils Absolute 0.2 0.0 - 0.7 K/uL   Basophils Relative 0 0 - 1 %   Basophils Absolute 0.0 0.0 - 0.1 K/uL  I-stat troponin, ED (not at [place])     Status: None   Collection Time: 08/18/14  3:55 PM  Result Value Ref Range   Troponin i, poc 0.00 0.00 - 0.08 ng/mL   Comment 3          Urinalysis, Routine w reflex microscopic     Status: Abnormal   Collection Time: 08/18/14  5:20 PM  Result Value Ref Range   Color, Urine YELLOW YELLOW   APPearance CLEAR CLEAR   Specific Gravity, Urine 1.011 1.005 - 1.030   pH 7.0 5.0 - 8.0   Glucose, UA NEGATIVE NEGATIVE mg/dL   Hgb urine dipstick NEGATIVE NEGATIVE   Bilirubin Urine NEGATIVE NEGATIVE   Ketones, ur NEGATIVE NEGATIVE mg/dL   Protein, ur NEGATIVE NEGATIVE mg/dL   Urobilinogen, UA 0.2 0.0 - 1.0 mg/dL   Nitrite NEGATIVE NEGATIVE   Leukocytes, UA TRACE (A) NEGATIVE  Urine microscopic-add on     Status: None   Collection Time: 08/18/14  5:20 PM  Result Value Ref Range   WBC, UA 0-2 <3 WBC/hpf   Bacteria, UA RARE RARE  I-stat troponin, ED     Status: None   Collection Time: 08/18/14  8:13 PM  Result Value Ref Range   Troponin i, poc 0.00 0.00 - 0.08 ng/mL   Comment 3            UA shows no evidence of UTI  Lab Results  Component Value Date   HGBA1C 6.1* 03/12/2014    Estimated Creatinine Clearance: 64.4 mL/min (by C-G formula based on Cr of 0.77).  BNP (last 3 results) No results for input(s): PROBNP in the last 8760 hours.  Other results:  I have pearsonaly reviewed this: ECG REPORT  Rate:54    Rhythm: Sinus bradycardia ST&T Change: No ischemic changes   Filed Weights   08/18/14 1531  Weight: 76.159 kg (167 lb 14.4 oz)    Cultures: No results found for: SDES, SPECREQUEST, CULT, REPTSTATUS   Radiological Exams on Admission: Ct Angio Head W/cm &/or Wo Cm  08/18/2014   CLINICAL DATA:  Near syncope x2 today while working outside. Vision became black. Initial encounter.  EXAM: CT ANGIOGRAPHY HEAD  TECHNIQUE: Multidetector CT imaging of the head was performed using the standard protocol during bolus  administration of intravenous contrast. Multiplanar CT image reconstructions and MIPs were obtained to evaluate the vascular anatomy.  CONTRAST:  28mL OMNIPAQUE IOHEXOL 350 MG/ML SOLN  COMPARISON:  CT head earlier today.  FINDINGS: CTA HEAD  Anterior circulation: No significant stenosis, proximal occlusion, aneurysm, or vascular malformation.  Posterior circulation: No significant stenosis, proximal occlusion, aneurysm, or vascular malformation.  Venous sinuses: As permitted by contrast timing, patent.  Anatomic variants: None of significance.  Delayed phase: There is mild enhancement of the 1 cm sized intrasellar lesion, slightly eccentric to the LEFT. The patient has undergone previous trans-sphenoidal resection for tumor. Findings are consistent with recurrent pituitary adenoma. No evidence for arterial signature to suggest aneurysm.  IMPRESSION: Findings consistent with recurrent pituitary macroadenoma. No evidence for arterial signature on CTA to suggest a berry aneurysm.  No intracranial flow reducing lesion, berry aneurysm, or venous sinus thrombosis.  Nonemergent MRI brain pituitary protocol is recommended for further evaluation.   Electronically Signed   By: Rolla Flatten M.D.   On: 08/18/2014 21:00   Dg Chest 2 View  08/18/2014   CLINICAL DATA:  Initial evaluation for near syncope, dizziness, left leg stiffness  EXAM: CHEST  2 VIEW  COMPARISON:  06/20/2012  FINDINGS: Heart size and vascular  pattern are normal. Lungs are clear. A snap projects over the left upper lobe. No effusion or pneumothorax.  IMPRESSION: No active cardiopulmonary disease.   Electronically Signed   By: Skipper Cliche M.D.   On: 08/18/2014 16:10   Ct Head Wo Contrast  08/18/2014   CLINICAL DATA:  Near-syncope x2 today.  EXAM: CT HEAD WITHOUT CONTRAST  TECHNIQUE: Contiguous axial images were obtained from the base of the skull through the vertex without intravenous contrast.  COMPARISON:  Pituitary protocol MR 03/30/2012.  FINDINGS: There is a hyperdense focus in the sella turcica measuring 1.1 by 1.0 cm on image 10. The brain otherwise appears normal without hemorrhage, infarct, midline shift or abnormal extra-axial fluid collection. No hydrocephalus or pneumocephalus. The calvarium is intact. Imaged paranasal sinuses and mastoid air cells are clear.  IMPRESSION: Hyper dense focus in the sella turcica is worrisome for recurrent pituitary adenoma. Aneurysm is possible but much less likely. Nonemergent pituitary protocol MRI is recommended for further evaluation. The patient's examination is otherwise negative.   Electronically Signed   By: Inge Rise M.D.   On: 08/18/2014 18:44    Chart has been reviewed  Assessment/Plan  74 yo w hx of colon cancer and pituitary adenoma here with questionable syncope  Present on Admission:  . Hypertension continue home medications  . Syncope monitor on telemetry. Obtain echogram cycle cardiac enzymes obtain carotid Dopplers. Most likely reason for syncope is orthostatic as patient was bending over and then suddenly stood up  . Prediabetes obtain hemoglobin A1c currently blood glucose within normal limits  . Weight loss have had regular mammograms as well as up-to-date colonoscopy. Will need to be followed up as an outpatient father workup also needed given history of colon cancer the past   Prophylaxis:   Lovenox, Protonix  CODE STATUS:  FULL CODE  Other plan as per  orders.  I have spent a total of 61min on this admission  Stefanny Pieri 08/18/2014, 11:20 PM  [facility]  Pager [phone]   after 2 AM please page floor coverage PA If 7AM-7PM, please contact the day team taking care of the patient  [email or web address]  Password [email or web address]

## 2014-08-19 ENCOUNTER — Observation Stay (HOSPITAL_COMMUNITY): Payer: Medicare Other

## 2014-08-19 DIAGNOSIS — I10 Essential (primary) hypertension: Secondary | ICD-10-CM | POA: Diagnosis present

## 2014-08-19 DIAGNOSIS — R930 Abnormal findings on diagnostic imaging of skull and head, not elsewhere classified: Secondary | ICD-10-CM | POA: Diagnosis not present

## 2014-08-19 DIAGNOSIS — R55 Syncope and collapse: Secondary | ICD-10-CM | POA: Diagnosis not present

## 2014-08-19 DIAGNOSIS — D352 Benign neoplasm of pituitary gland: Secondary | ICD-10-CM | POA: Diagnosis not present

## 2014-08-19 LAB — TROPONIN I
Troponin I: <0.03 ng/mL (ref <0.031)
Troponin I: <0.03 ng/mL (ref <0.031)
Troponin I: <0.03 ng/mL (ref <0.031)

## 2014-08-19 LAB — CBC
HCT: 38.9 % (ref 36.0–46.0)
Hemoglobin: 12.8 g/dL (ref 12.0–15.0)
MCH: 28.7 pg (ref 26.0–34.0)
MCHC: 32.9 g/dL (ref 30.0–36.0)
MCV: 87.2 fL (ref 78.0–100.0)
Platelets: 239 10*3/uL (ref 150–400)
RBC: 4.46 MIL/uL (ref 3.87–5.11)
RDW: 13 % (ref 11.5–15.5)
WBC: 6.4 10*3/uL (ref 4.0–10.5)

## 2014-08-19 LAB — COMPREHENSIVE METABOLIC PANEL
ALBUMIN: 3.8 g/dL (ref 3.5–5.2)
ALT: 27 U/L (ref 0–35)
ANION GAP: 3 — AB (ref 5–15)
AST: 24 U/L (ref 0–37)
Alkaline Phosphatase: 72 U/L (ref 39–117)
BUN: 9 mg/dL (ref 6–23)
CO2: 31 mmol/L (ref 19–32)
Calcium: 8.9 mg/dL (ref 8.4–10.5)
Chloride: 106 mmol/L (ref 96–112)
Creatinine, Ser: 0.66 mg/dL (ref 0.50–1.10)
GFR calc non Af Amer: 85 mL/min — ABNORMAL LOW (ref >90)
Glucose, Bld: 101 mg/dL — ABNORMAL HIGH (ref 70–99)
POTASSIUM: 3.9 mmol/L (ref 3.5–5.1)
SODIUM: 140 mmol/L (ref 135–145)
TOTAL PROTEIN: 6.3 g/dL (ref 6.0–8.3)
Total Bilirubin: 0.9 mg/dL (ref 0.3–1.2)

## 2014-08-19 LAB — PHOSPHORUS: Phosphorus: 3.6 mg/dL (ref 2.3–4.6)

## 2014-08-19 LAB — MAGNESIUM: MAGNESIUM: 2 mg/dL (ref 1.5–2.5)

## 2014-08-19 LAB — TSH: TSH: 2.354 u[IU]/mL (ref 0.350–4.500)

## 2014-08-19 MED ORDER — ASPIRIN 81 MG PO CHEW
81.0000 mg | CHEWABLE_TABLET | Freq: Every day | ORAL | Status: DC
  Administered 2014-08-19 – 2014-08-20 (×2): 81 mg via ORAL
  Filled 2014-08-19 (×3): qty 1

## 2014-08-19 MED ORDER — DOCUSATE SODIUM 100 MG PO CAPS
100.0000 mg | ORAL_CAPSULE | Freq: Two times a day (BID) | ORAL | Status: DC
  Filled 2014-08-19 (×3): qty 1

## 2014-08-19 MED ORDER — ACETAMINOPHEN 650 MG RE SUPP: 650.0000 mg | Freq: Four times a day (QID) | RECTAL | Status: DC | PRN

## 2014-08-19 MED ORDER — PAROXETINE HCL 10 MG PO TABS
10.0000 mg | ORAL_TABLET | Freq: Every day | ORAL | Status: DC
  Administered 2014-08-19 – 2014-08-20 (×2): 10 mg via ORAL
  Filled 2014-08-19 (×2): qty 1

## 2014-08-19 MED ORDER — SODIUM CHLORIDE 0.9 % IV SOLN
INTRAVENOUS | Status: AC
  Administered 2014-08-19 (×2): via INTRAVENOUS

## 2014-08-19 MED ORDER — SODIUM CHLORIDE 0.9 % IJ SOLN
3.0000 mL | Freq: Two times a day (BID) | INTRAMUSCULAR | Status: DC
  Administered 2014-08-19: 3 mL via INTRAVENOUS

## 2014-08-19 MED ORDER — GADOBENATE DIMEGLUMINE 529 MG/ML IV SOLN
15.0000 mL | Freq: Once | INTRAVENOUS | Status: AC | PRN
  Administered 2014-08-19: 15 mL via INTRAVENOUS

## 2014-08-19 MED ORDER — HYDROCODONE-ACETAMINOPHEN 5-325 MG PO TABS: 1.0000 | ORAL_TABLET | ORAL | Status: DC | PRN

## 2014-08-19 MED ORDER — ACETAMINOPHEN 325 MG PO TABS: 650.0000 mg | ORAL_TABLET | Freq: Four times a day (QID) | ORAL | Status: DC | PRN

## 2014-08-19 MED ORDER — ONDANSETRON HCL 4 MG/2ML IJ SOLN: 4.0000 mg | Freq: Four times a day (QID) | INTRAMUSCULAR | Status: DC | PRN

## 2014-08-19 MED ORDER — ATORVASTATIN CALCIUM 80 MG PO TABS
80.0000 mg | ORAL_TABLET | Freq: Every day | ORAL | Status: DC
  Filled 2014-08-19 (×2): qty 1

## 2014-08-19 MED ORDER — ENOXAPARIN SODIUM 40 MG/0.4ML ~~LOC~~ SOLN
40.0000 mg | SUBCUTANEOUS | Status: DC
  Administered 2014-08-20: 40 mg via SUBCUTANEOUS
  Filled 2014-08-19 (×3): qty 0.4

## 2014-08-19 MED ORDER — ONDANSETRON HCL 4 MG PO TABS: 4.0000 mg | ORAL_TABLET | Freq: Four times a day (QID) | ORAL | Status: DC | PRN

## 2014-08-19 NOTE — Progress Notes (Signed)
UR completed 

## 2014-08-19 NOTE — Progress Notes (Signed)
08/19/2014 05:05 AM The patient arrived on the floor from the ED. Patient is alert and oriented. Patient is stable. Kristen [name]

## 2014-08-19 NOTE — Progress Notes (Signed)
  Echocardiogram 2D Echocardiogram has been performed.  Kristen [name] FRANCES 08/19/2014, 2:21 PM

## 2014-08-19 NOTE — Progress Notes (Signed)
[facility] PROGRESS NOTE  Kristen [name] JFH:[id number] DOB: [birth year] DOA: 08/18/2014 PCP: Alesia Richards, MD  Assessment/Plan:  Principal Problem:   Syncope: suspect secondary to orthostasis. HCTZ held. Continue IVF. Echo. Carotids pending. Active Problems:   Hypertension: see above   Pituitary tumor (excised 2013): CT brain shows 1 cm pituitary lesion. Patient reports left eye visual acuity changes. Concerned about recurrence. Dr. Arnoldo Morale is pts neurosurgeon . Needs MRI brain sooner than later. Pt agreeable to stay. Will get MRI  HPI/Subjective: Feels better. Reports left eye vision difficulty since Christmas, similar to symptoms previous to when she had pituitary tumor.  Ambulating difficulty.  Objective: Filed Vitals:   08/19/14 1457  BP: 120/53  Pulse: 59  Temp: 98.5 F (36.9 C)  Resp: 19    Intake/Output Summary (Last 24 hours) at 08/19/14 1653 Last data filed at 08/19/14 1256  Gross per 24 hour  Intake    600 ml  Output      0 ml  Net    600 ml   Filed Weights   08/18/14 1531 08/19/14 0511  Weight: 76.159 kg (167 lb 14.4 oz) 74.118 kg (163 lb 6.4 oz)    Exam:   General:  A and o.   HEENT: PERRL. EOMI. Visual acuity.  Cardiovascular: RRR without MGR  Respiratory: CTA without WRR  Abdomen: S, NT, ND, normal bowel sounds  Ext: no CCE  Basic Metabolic Panel:  Recent Labs Lab 08/18/14 1527 08/19/14 0607  NA 139 140  K 3.9 3.9  CL 102 106  CO2 25 31  GLUCOSE 93 101*  BUN 13 9  CREATININE 0.77 0.66  CALCIUM 9.7 8.9  MG  --  2.0  PHOS  --  3.6   Liver Function Tests:  Recent Labs Lab 08/19/14 0607  AST 24  ALT 27  ALKPHOS 72  BILITOT 0.9  PROT 6.3  ALBUMIN 3.8   No results for input(s): LIPASE, AMYLASE in the last 168 hours. No results for input(s): AMMONIA in the last 168 hours. CBC:  Recent Labs Lab 08/18/14 1527 08/19/14 0607  WBC 7.4 6.4  NEUTROABS 5.0  --   HGB 13.0 12.8  HCT 40.0 38.9  MCV 87.9  87.2  PLT 246 239   Cardiac Enzymes:  Recent Labs Lab 08/19/14 0607 08/19/14 1143  TROPONINI <0.03 <0.03   BNP (last 3 results) No results for input(s): BNP in the last 8760 hours.  ProBNP (last 3 results) No results for input(s): PROBNP in the last 8760 hours.  CBG: No results for input(s): GLUCAP in the last 168 hours.  No results found for this or any previous visit (from the past 240 hour(s)).   Studies: Ct Angio Head W/cm &/or Wo Cm  08/18/2014   CLINICAL DATA:  Near syncope x2 today while working outside. Vision became black. Initial encounter.  EXAM: CT ANGIOGRAPHY HEAD  TECHNIQUE: Multidetector CT imaging of the head was performed using the standard protocol during bolus administration of intravenous contrast. Multiplanar CT image reconstructions and MIPs were obtained to evaluate the vascular anatomy.  CONTRAST:  14mL OMNIPAQUE IOHEXOL 350 MG/ML SOLN  COMPARISON:  CT head earlier today.  FINDINGS: CTA HEAD  Anterior circulation: No significant stenosis, proximal occlusion, aneurysm, or vascular malformation.  Posterior circulation: No significant stenosis, proximal occlusion, aneurysm, or vascular malformation.  Venous sinuses: As permitted by contrast timing, patent.  Anatomic variants: None of significance.  Delayed phase: There is mild enhancement of the 1 cm sized intrasellar lesion, slightly  eccentric to the LEFT. The patient has undergone previous trans-sphenoidal resection for tumor. Findings are consistent with recurrent pituitary adenoma. No evidence for arterial signature to suggest aneurysm.  IMPRESSION: Findings consistent with recurrent pituitary macroadenoma. No evidence for arterial signature on CTA to suggest a berry aneurysm.  No intracranial flow reducing lesion, berry aneurysm, or venous sinus thrombosis.  Nonemergent MRI brain pituitary protocol is recommended for further evaluation.   Electronically Signed   By: Rolla Flatten M.D.   On: 08/18/2014 21:00   Dg  Chest 2 View  08/18/2014   CLINICAL DATA:  Initial evaluation for near syncope, dizziness, left leg stiffness  EXAM: CHEST  2 VIEW  COMPARISON:  06/20/2012  FINDINGS: Heart size and vascular pattern are normal. Lungs are clear. A snap projects over the left upper lobe. No effusion or pneumothorax.  IMPRESSION: No active cardiopulmonary disease.   Electronically Signed   By: Skipper Cliche M.D.   On: 08/18/2014 16:10   Ct Head Wo Contrast  08/18/2014   CLINICAL DATA:  Near-syncope x2 today.  EXAM: CT HEAD WITHOUT CONTRAST  TECHNIQUE: Contiguous axial images were obtained from the base of the skull through the vertex without intravenous contrast.  COMPARISON:  Pituitary protocol MR 03/30/2012.  FINDINGS: There is a hyperdense focus in the sella turcica measuring 1.1 by 1.0 cm on image 10. The brain otherwise appears normal without hemorrhage, infarct, midline shift or abnormal extra-axial fluid collection. No hydrocephalus or pneumocephalus. The calvarium is intact. Imaged paranasal sinuses and mastoid air cells are clear.  IMPRESSION: Hyper dense focus in the sella turcica is worrisome for recurrent pituitary adenoma. Aneurysm is possible but much less likely. Nonemergent pituitary protocol MRI is recommended for further evaluation. The patient's examination is otherwise negative.   Electronically Signed   By: Inge Rise M.D.   On: 08/18/2014 18:44    Scheduled Meds: . aspirin  81 mg Oral Daily  . atorvastatin  80 mg Oral q1800  . docusate sodium  100 mg Oral BID  . enoxaparin (LOVENOX) injection  40 mg Subcutaneous Q24H  . PARoxetine  10 mg Oral Daily  . sodium chloride  3 mL Intravenous Q12H   Continuous Infusions:   Time spent: 35 minutes  [facility] [email or web address], password [email or web address] 08/19/2014, 4:53 PM

## 2014-08-20 DIAGNOSIS — R55 Syncope and collapse: Secondary | ICD-10-CM | POA: Diagnosis not present

## 2014-08-20 DIAGNOSIS — I10 Essential (primary) hypertension: Secondary | ICD-10-CM | POA: Diagnosis not present

## 2014-08-20 DIAGNOSIS — D352 Benign neoplasm of pituitary gland: Secondary | ICD-10-CM | POA: Diagnosis not present

## 2014-08-20 LAB — HEMOGLOBIN A1C
Hgb A1c MFr Bld: 6 % — ABNORMAL HIGH (ref 4.8–5.6)
Mean Plasma Glucose: 126 mg/dL

## 2014-08-20 MED ORDER — LISINOPRIL 10 MG PO TABS: 10.0000 mg | ORAL_TABLET | Freq: Every day | ORAL | Status: DC

## 2014-08-20 MED ORDER — SODIUM CHLORIDE 0.9 % IJ SOLN: 3.0000 mL | Freq: Two times a day (BID) | INTRAMUSCULAR | Status: DC

## 2014-08-20 MED ORDER — SODIUM CHLORIDE 0.9 % IJ SOLN: 3.0000 mL | INTRAMUSCULAR | Status: DC | PRN

## 2014-08-20 MED ORDER — SODIUM CHLORIDE 0.9 % IV SOLN: 250.0000 mL | INTRAVENOUS | Status: DC | PRN

## 2014-08-20 NOTE — Discharge Summary (Signed)
Physician Discharge Summary  Kristen [name] HTD:[id number] DOB: 1941-02-18 DOA: 08/18/2014  PCP: Alesia Richards, MD  Admit date: 08/18/2014 Discharge date: 08/20/2014  Recommendations for Outpatient Follow-up:  1. Outpatient f/u with neurosurgery for recurrence of pituitary macroadenoma. Discussed with Dr. Arnoldo Morale. 2. Monitor blood pressure  Discharge Diagnoses:  Principal Problem:   Syncope: suspect volume depletion/transient hypotension Active Problems:   Hypertension   Prediabetes   Pituitary tumor (excised 2013)   Weight loss   Essential hypertension, benign   Pituitary macroadenoma   Discharge Condition: stable  Diet recommendation: heart healthy carbohydrate modified  Filed Weights   08/18/14 1531 08/19/14 0511  Weight: 76.159 kg (167 lb 14.4 oz) 74.118 kg (163 lb 6.4 oz)    History of present illness:  74 y.o. female   has a past medical history of Shortness of breath; Pneumonia; GERD (gastroesophageal reflux disease); Headache(784.0); Depression; Colon polyps; Arthritis; Hypertension; Dysrhythmia (04/2012); Pituitary mass (NOV 2013); PONV (postoperative nausea and vomiting); Colon cancer (1990s, 2014); Vitamin D deficiency; and Prediabetes.   Issue and has history of transverse colon cancer diagnosed in 1999 at [place] post resection she's been followed by GI in the last colonoscopy done in August 2013. She was found to have anomalous polyp at that time which had to be resected with biopsy showing evidence of tubovillous adenoma which was resected. Patient also has history of suprasellar lesion consistent with pituitary treated normal she status post transsphenoidal resection by Dr. Arnoldo Morale and Dr. Lucia Gaskins in November 2013 she presented to emergency department after she was found down by her grandson she cannot recollect any syncope. Patient was pulling grass bent over. She tried to stand up but had to grab a post. Patient adamant that she did not passed out. She was  brought to ER During evaluation in emergency department she was found to have Hyperdense focus in the sella turcica is worrisome for recurrent pituitary adenoma. This was discussed with neurology who recommends outpatient follow-up.  Of note: patient had a tick bite yesterday. Also over past year she has lost 30-50 lbs. Her last colonoscopy was in 2015. She has had regular mammograms last was 10/2013.  EKG is unremarkable troponin is unremarkable 2 patient is being admitted for further evaluation  Hospital Course:  Syncope: suspect secondary to orthostasis. HCTZ held. Monitored on telemetry. IVF. Echo. Carotids without significant findings. No further episodes.  Have stopped HCTZ at discharge and lowered lisinopril dose. With weight loss, likely needs adjustment in antihypertensives longterm. Needs outpatient monitoring   Pituitary tumor (excised 2013): CT brain shows 1 cm pituitary lesion. Patient reports left eye visual acuity changes. MRI confirmed recurrence of tumor. Discussed with Dr. Arnoldo Morale, neurosurgery. He will f/u in office. Patient requesting discharge to get back to her pet bird.   Procedures:  none  Consultations:  none  Discharge Exam: Filed Vitals:   08/20/14 1501  BP: 155/57  Pulse: 65  Temp: 98.4 F (36.9 C)  Resp: 18    General: a and o HEENT: visual acuity good in both eyes Cardiovascular: RRR Respiratory: CTA  Discharge Instructions   Discharge Instructions    Diet - low sodium heart healthy    Complete by:  As directed      Increase activity slowly    Complete by:  As directed           Current Discharge Medication List    START taking these medications   Details  lisinopril (PRINIVIL,ZESTRIL) 10 MG tablet Take 1 tablet (10 mg  total) by mouth daily. Qty: 30 tablet, Refills: 0      CONTINUE these medications which have NOT CHANGED   Details  aspirin 81 MG tablet Take 81 mg by mouth daily.    atorvastatin (LIPITOR) 80 MG tablet 1/2 to 1  tab daily or as directed for cholesterol Qty: 30 tablet, Refills: 3    Cholecalciferol (VITAMIN D PO) Take 2,000 Units by mouth 3 (three) times daily.    PARoxetine (PAXIL) 20 MG tablet Take 1 tablet (20 mg total) by mouth daily. Qty: 90 tablet, Refills: 1    ranitidine (ZANTAC) 300 MG capsule Take 300 mg by mouth daily as needed for heartburn.       STOP taking these medications     lisinopril-hydrochlorothiazide (PRINZIDE,ZESTORETIC) 20-12.5 MG per tablet        Allergies  Allergen Reactions  . Aspirin     High Doses  . Codeine Nausea And Vomiting  . Fenofibrate Cough  . Hydrochlorothiazide     Fatigue  . Metoclopramide     Sedation   Follow-up Information    Follow up with Alesia Richards, MD.   Specialty:  Internal Medicine   Why:  1-2 weeks to check blood pressure   Contact information:   [address] [place] [address] [phone]       Follow up with Ophelia Charter, MD.   Specialty:  Neurosurgery   Why:  for abnormal MRI brain   Contact information:   [address] [place] [address] [phone]        The results of significant diagnostics from this hospitalization (including imaging, microbiology, ancillary and laboratory) are listed below for reference.    Significant Diagnostic Studies: Ct Angio Head W/cm &/or Wo Cm  08/18/2014   CLINICAL DATA:  Near syncope x2 today while working outside. Vision became black. Initial encounter.  EXAM: CT ANGIOGRAPHY HEAD  TECHNIQUE: Multidetector CT imaging of the head was performed using the standard protocol during bolus administration of intravenous contrast. Multiplanar CT image reconstructions and MIPs were obtained to evaluate the vascular anatomy.  CONTRAST:  63mL OMNIPAQUE IOHEXOL 350 MG/ML SOLN  COMPARISON:  CT head earlier today.  FINDINGS: CTA HEAD  Anterior circulation: No significant stenosis, proximal occlusion, aneurysm, or vascular malformation.   Posterior circulation: No significant stenosis, proximal occlusion, aneurysm, or vascular malformation.  Venous sinuses: As permitted by contrast timing, patent.  Anatomic variants: None of significance.  Delayed phase: There is mild enhancement of the 1 cm sized intrasellar lesion, slightly eccentric to the LEFT. The patient has undergone previous trans-sphenoidal resection for tumor. Findings are consistent with recurrent pituitary adenoma. No evidence for arterial signature to suggest aneurysm.  IMPRESSION: Findings consistent with recurrent pituitary macroadenoma. No evidence for arterial signature on CTA to suggest a berry aneurysm.  No intracranial flow reducing lesion, berry aneurysm, or venous sinus thrombosis.  Nonemergent MRI brain pituitary protocol is recommended for further evaluation.   Electronically Signed   By: Rolla Flatten M.D.   On: 08/18/2014 21:00   Dg Chest 2 View  08/18/2014   CLINICAL DATA:  Initial evaluation for near syncope, dizziness, left leg stiffness  EXAM: CHEST  2 VIEW  COMPARISON:  06/20/2012  FINDINGS: Heart size and vascular pattern are normal. Lungs are clear. A snap projects over the left upper lobe. No effusion or pneumothorax.  IMPRESSION: No active cardiopulmonary disease.   Electronically Signed   By: Skipper Cliche M.D.   On:  08/18/2014 16:10   Ct Head Wo Contrast  08/18/2014   CLINICAL DATA:  Near-syncope x2 today.  EXAM: CT HEAD WITHOUT CONTRAST  TECHNIQUE: Contiguous axial images were obtained from the base of the skull through the vertex without intravenous contrast.  COMPARISON:  Pituitary protocol MR 03/30/2012.  FINDINGS: There is a hyperdense focus in the sella turcica measuring 1.1 by 1.0 cm on image 10. The brain otherwise appears normal without hemorrhage, infarct, midline shift or abnormal extra-axial fluid collection. No hydrocephalus or pneumocephalus. The calvarium is intact. Imaged paranasal sinuses and mastoid air cells are clear.  IMPRESSION: Hyper  dense focus in the sella turcica is worrisome for recurrent pituitary adenoma. Aneurysm is possible but much less likely. Nonemergent pituitary protocol MRI is recommended for further evaluation. The patient's examination is otherwise negative.   Electronically Signed   By: Inge Rise M.D.   On: 08/18/2014 18:44   Mr Jeri Cos RS Contrast  08/19/2014   CLINICAL DATA:  74 year old female status post pituitary tumor resection in 2013. Left eye visual changes. Restaging. Subsequent encounter.  EXAM: MRI HEAD WITHOUT AND WITH CONTRAST  TECHNIQUE: Multiplanar, multiecho pulse sequences of the brain and surrounding structures were obtained without and with intravenous contrast.  CONTRAST:  38mL MULTIHANCE GADOBENATE DIMEGLUMINE 529 MG/ML IV SOLN  COMPARISON:  Head CT without contrast 08/18/2014. Preoperative brain MRI 03/30/2012.  FINDINGS: Cerebral volume is within normal limits for age and has not significantly changed since 2013. Major intracranial vascular flow voids are stable. No restricted diffusion to suggest acute infarction. No midline shift, mass effect, evidence of mass lesion, ventriculomegaly, extra-axial collection or acute intracranial hemorrhage. Cervicomedullary junction within normal limits. Negative for age visualized cervical spine.  Scattered small nonspecific foci of cerebral white matter T2 and FLAIR hyperintensity have not significantly changed allowing for differences in technique.  Hyperostosis frontalis. Visualized bone marrow signal including at the skullbase is within normal limits. Stable postoperative changes to the globes. Visible internal auditory structures appear normal. Mastoids remain clear. Mild paranasal sinus mucosal thickening similar to the prior study. Negative scalp soft tissues.  Decreased suprasellar mass effect compared to the 2013 preoperative study. The pituitary infundibulum now was visible. Hypothalamus within normal limits. There is a heterogeneously enhancing  and partially T1 intrinsic hourglass shaped mass with both intra sellar and suprasellar components today encompassing 16 x 15 x 17 mm (versus 2-3 cm in size preoperatively). No definite cavernous sinus invasion. The suprasellar component deviates the infundibulum to the left (series 13, images 4 and 5). Effacement of the optic chiasm. No orbital apex involvement identified.  No other abnormal intracranial enhancement identified.  IMPRESSION: 1. Recurrent pituitary adenoma with intra sellar and suprasellar components. Associated deviation of the infundibulum to the left with otherwise mild suprasellar mass effect. No cavernous sinus invasion suspected. 2. No other acute intracranial abnormality.   Electronically Signed   By: Genevie Ann M.D.   On: 08/19/2014 19:19   Echo Left ventricle: The cavity size was normal. Wall thickness was increased in a pattern of mild LVH. Systolic function was vigorous. The estimated ejection fraction was in the range of 65% to 70%. Doppler parameters are consistent with abnormal left ventricular relaxation (grade 1 diastolic dysfunction). The E/e&' ratio is between 8-15, suggesting indeterminate LV filling pressure. - Mitral valve: Mildly thickened leaflets . There was trivial regurgitation. - Left atrium: The atrium was normal in size.  Impressions:  - Compared to the prior echo in 2013, there is now mild LVH  and diastolic dysfunction. LVEF is 65-70%  Carotid doppler The vertebral arteries appear patent with antegrade flow. - Findings consistent with 1-39 percent stenosis involving the right internal carotid artery and the left internal carotid artery. - ICA/CCA ratio. right = 0.94. left = 0.72.  Microbiology: No results found for this or any previous visit (from the past 240 hour(s)).   Labs: Basic Metabolic Panel:  Recent Labs Lab 08/18/14 1527 08/19/14 0607  NA 139 140  K 3.9 3.9  CL 102 106  CO2 25 31  GLUCOSE 93 101*  BUN 13  9  CREATININE 0.77 0.66  CALCIUM 9.7 8.9  MG  --  2.0  PHOS  --  3.6   Liver Function Tests:  Recent Labs Lab 08/19/14 0607  AST 24  ALT 27  ALKPHOS 72  BILITOT 0.9  PROT 6.3  ALBUMIN 3.8   No results for input(s): LIPASE, AMYLASE in the last 168 hours. No results for input(s): AMMONIA in the last 168 hours. CBC:  Recent Labs Lab 08/18/14 1527 08/19/14 0607  WBC 7.4 6.4  NEUTROABS 5.0  --   HGB 13.0 12.8  HCT 40.0 38.9  MCV 87.9 87.2  PLT 246 239   Cardiac Enzymes:  Recent Labs Lab 08/19/14 0607 08/19/14 1143 08/19/14 2008  TROPONINI <0.03 <0.03 <0.03   BNP: BNP (last 3 results) No results for input(s): BNP in the last 8760 hours.  ProBNP (last 3 results) No results for input(s): PROBNP in the last 8760 hours.  CBG: No results for input(s): GLUCAP in the last 168 hours.  SignedDelfina Redwood  [facility] 08/20/2014, 3:32 PM

## 2014-08-20 NOTE — Progress Notes (Signed)
UR completed 

## 2014-08-20 NOTE — Progress Notes (Signed)
*  PRELIMINARY RESULTS* Vascular Ultrasound Carotid Duplex (Doppler) has been completed.  Preliminary findings: Bilateral:  1-39% ICA stenosis.  Vertebral artery flow is antegrade, however, the right vertebral demonstrates an atypical waveform. This is possibly suggestive of early "bunny sign."     Landry Mellow, RDMS, RVT  08/20/2014, 12:01 PM

## 2014-08-20 NOTE — Progress Notes (Signed)
PATIENT HAS SLEPT WELL OVERNIGHT. NO COMPLAINTS OR REQUESTS AT THIS TIME.  INSTRUCTED TO CALL FOR ASSISTANCE WHEN NEEDED.

## 2014-09-11 ENCOUNTER — Encounter: Payer: Self-pay | Admitting: Internal Medicine

## 2014-09-30 ENCOUNTER — Encounter: Payer: Self-pay | Admitting: Internal Medicine

## 2014-09-30 ENCOUNTER — Ambulatory Visit (INDEPENDENT_AMBULATORY_CARE_PROVIDER_SITE_OTHER): Payer: Medicare Other | Admitting: Internal Medicine

## 2014-09-30 VITALS — BP 168/80 | HR 56 | Temp 97.7°F | Resp 16 | Ht 64.0 in | Wt 163.0 lb

## 2014-09-30 DIAGNOSIS — R7309 Other abnormal glucose: Secondary | ICD-10-CM

## 2014-09-30 DIAGNOSIS — I10 Essential (primary) hypertension: Secondary | ICD-10-CM | POA: Diagnosis not present

## 2014-09-30 DIAGNOSIS — E782 Mixed hyperlipidemia: Secondary | ICD-10-CM

## 2014-09-30 DIAGNOSIS — Z23 Encounter for immunization: Secondary | ICD-10-CM | POA: Diagnosis not present

## 2014-09-30 DIAGNOSIS — R6889 Other general symptoms and signs: Secondary | ICD-10-CM | POA: Diagnosis not present

## 2014-09-30 DIAGNOSIS — Z1212 Encounter for screening for malignant neoplasm of rectum: Secondary | ICD-10-CM

## 2014-09-30 DIAGNOSIS — Z79899 Other long term (current) drug therapy: Secondary | ICD-10-CM

## 2014-09-30 DIAGNOSIS — Z9181 History of falling: Secondary | ICD-10-CM

## 2014-09-30 DIAGNOSIS — Z0001 Encounter for general adult medical examination with abnormal findings: Secondary | ICD-10-CM

## 2014-09-30 DIAGNOSIS — R7303 Prediabetes: Secondary | ICD-10-CM

## 2014-09-30 DIAGNOSIS — E559 Vitamin D deficiency, unspecified: Secondary | ICD-10-CM | POA: Diagnosis not present

## 2014-09-30 DIAGNOSIS — F329 Major depressive disorder, single episode, unspecified: Secondary | ICD-10-CM

## 2014-09-30 DIAGNOSIS — Z1331 Encounter for screening for depression: Secondary | ICD-10-CM

## 2014-09-30 DIAGNOSIS — Z0000 Encounter for general adult medical examination without abnormal findings: Secondary | ICD-10-CM

## 2014-09-30 DIAGNOSIS — F32A Depression, unspecified: Secondary | ICD-10-CM

## 2014-09-30 DIAGNOSIS — Z85038 Personal history of other malignant neoplasm of large intestine: Secondary | ICD-10-CM | POA: Diagnosis not present

## 2014-09-30 LAB — CBC WITH DIFFERENTIAL/PLATELET
Basophils Absolute: 0.1 10*3/uL (ref 0.0–0.1)
Basophils Relative: 1 % (ref 0–1)
EOS ABS: 0.1 10*3/uL (ref 0.0–0.7)
Eosinophils Relative: 2 % (ref 0–5)
HEMATOCRIT: 37.9 % (ref 36.0–46.0)
HEMOGLOBIN: 12.7 g/dL (ref 12.0–15.0)
LYMPHS PCT: 28 % (ref 12–46)
Lymphs Abs: 1.8 10*3/uL (ref 0.7–4.0)
MCH: 28.5 pg (ref 26.0–34.0)
MCHC: 33.5 g/dL (ref 30.0–36.0)
MCV: 85.2 fL (ref 78.0–100.0)
MONOS PCT: 6 % (ref 3–12)
MPV: 10.5 fL (ref 8.6–12.4)
Monocytes Absolute: 0.4 10*3/uL (ref 0.1–1.0)
Neutro Abs: 4 10*3/uL (ref 1.7–7.7)
Neutrophils Relative %: 63 % (ref 43–77)
PLATELETS: 243 10*3/uL (ref 150–400)
RBC: 4.45 MIL/uL (ref 3.87–5.11)
RDW: 13.8 % (ref 11.5–15.5)
WBC: 6.3 10*3/uL (ref 4.0–10.5)

## 2014-09-30 NOTE — Progress Notes (Signed)
Patient ID: Kristen [name], female   DOB: 11-May-1941, 74 y.o.   MRN: [id number]  MEDICARE ANNUAL WELLNESS VISIT AND CPE  Assessment:   1. Essential hypertension, benign  - Microalbumin / creatinine urine ratio - EKG 12-Lead - TSH - recommended freq BP checks at home & call if elevated > 140/90  2. Hyperlipidemia  - Lipid panel  3. Prediabetes  - Hemoglobin A1c - Insulin, random  4. Vitamin D deficiency  - Vit D  25 hydroxy (rtn osteoporosis monitoring)  5. Depression, controlled   6. History of colon cancer, stage II   7. Screening for rectal cancer  - POC Hemoccult Bld/Stl (3-Cd Home Screen); Future  8. Depression screen   9. Medication management - Urine Microscopic - CBC with Differential/Platelet - BASIC METABOLIC PANEL WITH GFR - Hepatic function panel - Magnesium  10. At low risk for fall   11. Routine general medical examination at a health care facility   Plan:   During the course of the visit the patient was educated and counseled about appropriate screening and preventive services including:    Pneumococcal vaccine   Influenza vaccine  Td vaccine  Screening electrocardiogram  Bone densitometry screening  Colorectal cancer screening  Diabetes screening  Glaucoma screening  Nutrition counseling   Advanced directives: requested  Screening recommendations, referrals: Vaccinations:  Immunization History  Administered Date(s) Administered  . Influenza Split 04/19/2012  . Influenza, High Dose Seasonal PF 04/17/2013, 03/12/2014  . Pneumococcal Polysaccharide-23 04/19/2012  . Td 07/20/2005  Prevnar vaccine undecided Shingles vaccine undecided Hep B vaccine not indicated  Nutrition assessed and recommended  Colonoscopy Mar 2015 Recommended yearly ophthalmology/optometry visit for glaucoma screening and checkup Recommended yearly dental visit for hygiene and checkup Advanced directives - Mar 2015 - Dr Deborha Payment  Conditions/risks identified: BMI: Discussed weight loss, diet, and increase physical activity.  Increase physical activity: AHA recommends 150 minutes of physical activity a week.  Medications reviewed PreDiabetes is not at goal, ACE/ARB therapy: Yes. Urinary Incontinence is not an issue: discussed non pharmacology and pharmacology options.  Fall risk: low- discussed PT, home fall assessment, medications.   Subjective:    Kristen [name]  presents for Medicare Annual Wellness Visit and complete physical.  Date of last medicare wellness visit is unknown.  This very nice 74 y.o. WWF presents for  follow up with Hypertension, Hyperlipidemia, Pre-Diabetes and Vitamin D Deficiency. Patient is also s/p trans-sphenoidal excision of a pit adenoma and she had a recent hospital eval for a presyncopal episode wit a neg w/u except for a question of a local recurrence of her pit tumor and she relates she is scheduled for fu with her neurosurgeon tomorrow.   Patient is treated for HTN & BP has been controlled at home. Today's BP: (!) 168/80 mmHg. Patient has had no complaints of any cardiac type chest pain, palpitations, dyspnea/orthopnea/PND, dizziness, claudication, or dependent edema.   Hyperlipidemia is controlled with diet & meds. Patient denies myalgias or other med SE's. Last Lipids were Chol 199; HDL 53; LDL  121; Trig 126 on 03/12/2014.   Also, the patient has history of PreDiabetes and has had no symptoms of reactive hypoglycemia, diabetic polys, paresthesias or visual blurring.  Last A1c was  6.0% on 08/19/2014.   Further, the patient also has history of Vitamin D Deficiency and supplements vitamin D without any suspected side-effects. Last vitamin D was  82 on  03/12/2014.  Names of Other Physician/Practitioners you currently use: 1. [facility] here for primary care 2. Referred to Dr Delman Cheadle, eye doctor, to schedule visual fields 3. Dr ? dentist, last  visit 2015  Patient Care Team: Unk Pinto, MD as PCP - General (Internal Medicine)  Medication Review: Medication Sig  . aspirin 81 MG tablet Take 81 mg by mouth daily.  Marland Kitchen atorvastatin (LIPITOR) 80 MG tablet 1/2 to 1 tab daily or as directed for cholesterol  . Cholecalciferol (VITAMIN D PO) Take 2,000 Units by mouth 3 (three) times daily.  Marland Kitchen lisinopril (PRINIVIL,ZESTRIL) 10 MG tablet Take 1 tablet (10 mg total) by mouth daily.  Marland Kitchen PARoxetine (PAXIL) 20 MG tablet Take 1 tablet (20 mg total) by mouth daily. (Patient taking differently: Take 10 mg by mouth daily. )  . ranitidine (ZANTAC) 300 MG capsule Take 300 mg by mouth daily as needed for heartburn.    Current Problems (verified) Patient Active Problem List   Diagnosis Date Noted  . Hypertension 08/19/2014  . Syncope 08/18/2014  . Pituitary tumor (excised 2013) 03/12/2014  . Medication management 09/10/2013  . Hyperlipidemia 06/11/2013  . Vitamin D deficiency   . Prediabetes   . History of colon cancer, stage II 10/12/2012  . Obesity (BMI 30-39.9) 07/01/2012  . GERD (gastroesophageal reflux disease)   . Depression, controlled   . SVT (supraventricular tachycardia) 04/21/2012  . Adenomatous polyps-recurrent 02/03/2012   Screening Tests Health Maintenance  Topic Date Due  . ZOSTAVAX  06/04/2000  . DEXA SCAN  06/04/2005  . PNA vac Low Risk Adult (2 of 2 - PCV13) 04/19/2013  . INFLUENZA VACCINE  01/05/2015  . TETANUS/TDAP  07/21/2015  . MAMMOGRAM  10/16/2015  . COLONOSCOPY  08/30/2023   Immunization History  Administered Date(s) Administered  . Influenza Split 04/19/2012  . Influenza, High Dose Seasonal PF 04/17/2013, 03/12/2014  . Pneumococcal Polysaccharide-23 04/19/2012  . Td 07/20/2005   Preventative care: Last colonoscopy: Mar 2015  History reviewed: allergies, current medications, past family history, past medical history, past social history, past surgical history and problem list  Risk  Factors: Tobacco History  Substance Use Topics  . Smoking status: Never Smoker   . Smokeless tobacco: Not on file  . Alcohol Use: No   She does not smoke.  Patient is not a former smoker. Are there smokers in your home (other than you)?  No Alcohol Current alcohol use: none  Caffeine Current caffeine use: coffee 1-2 cups /day  Exercise Current exercise: gardening, housecleaning, walking and yard work  Nutrition/Diet Current diet: in general, a "healthy" diet    Cardiac risk factors: advanced age (older than 16 for men, 10 for women), dyslipidemia, hypertension, obesity (BMI >= 30 kg/m2) and sedentary lifestyle.  Depression Screen (Note: if answer to either of the following is "Yes", a more complete depression screening is indicated)   Q1: Over the past two weeks, have you felt down, depressed or hopeless? No  Q2: Over the past two weeks, have you felt little interest or pleasure in doing things? No  Have you lost interest or pleasure in daily life? No  Do you often feel hopeless? No  Do you cry easily over simple problems? No  Activities of Daily Living In your present state of health, do you have any difficulty performing the following activities?:  Driving? No Managing money?  No Feeding yourself? No Getting from bed to chair? No Climbing a flight of stairs? No Preparing food and eating?: No Bathing or showering? No Getting dressed: No Getting to  the toilet? No Using the toilet:No Moving around from place to place: No In the past year have you fallen or had a near fall?:No   Are you sexually active?  No  Do you have more than one partner?  No  Vision Difficulties: No  Hearing Difficulties: No Do you often ask people to speak up or repeat themselves? No Do you experience ringing or noises in your ears? No Do you have difficulty understanding soft or whispered voices? Sometimes.  Cognition  Do you feel that you have a problem with memory?No  Do you often  misplace items? No  Do you feel safe at home?  Yes  Advanced directives Does patient have a [place]? No & declines offer for forms. Does patient have a Living Will? No & declines offer for forms.  Past Medical History  Diagnosis Date  . Shortness of breath     exertion  . Pneumonia     hx of pneumonia  . GERD (gastroesophageal reflux disease)   . Headache(784.0)     hx migraines  . Depression   . Colon polyps   . Arthritis   . Hypertension     pt states she has not taken any b/p medication since her brain surgery in oct 3013--took herself off the medication  . Dysrhythmia 04/2012    SVT - RATE 145 POST OP CRANIOTOMY SURGERY --TX'D AND RESOLVED--PT STATES NO FURTHER PROBLEM THAT SHE IS AWARE OF -SHE HAS NOT HAD TO SEE CARDIOLOGIST  . Pituitary mass NOV 2013    MASS FOUND AFTER PT EXPERIENCED BILATERAL BLURRED / HAZY VISION -ESP LEFT EYE.  S/P CRANIOTOMY AND EYESIGHT RETURNED TO NORMAL--PT STILL HAS A LOT OF TENDERNESS RIGHT SIDE OF NOSE --THE CRANIOTOMY WAS DONE BY TRANSNASAL APPROACH  . PONV (postoperative nausea and vomiting)     PT STATES PLEASE NOTE SHE HAS TENDERNESS RIGHT SIDE OF NOSE - SINCE TRANSNASAL CRANIOTOMY 04/2012  . Colon cancer 1990s, 2014  . Vitamin D deficiency   . Prediabetes    Past Surgical History  Procedure Laterality Date  . Colon resection  1988  . Colonoscopy  09/20/2011    Procedure: COLONOSCOPY;  Surgeon: Missy Sabins, MD;  Location: [facility];  Service: Endoscopy;  Laterality: N/A;  . Hot hemostasis  09/20/2011    Procedure: HOT HEMOSTASIS (ARGON PLASMA COAGULATION/BICAP);  Surgeon: Missy Sabins, MD;  Location: [facility];  Service: Endoscopy;  Laterality: N/A;  . Cataract extraction  June/july 2013    bilateral eyes  . Colonoscopy  12/15/2011    Procedure: COLONOSCOPY;  Surgeon: Missy Sabins, MD;  Location: [facility];  Service: Endoscopy;  Laterality: N/A;  . Colonoscopy  01/26/2012    Procedure: COLONOSCOPY;  Surgeon:  Cleotis Nipper, MD;  Location: [facility];  Service: Endoscopy;  Laterality: N/A;  . Colon surgery    . Eye surgery      cataract bil  . Tumor removal      Left leg  . Back surgery    . Craniotomy  04/18/2012    Procedure: CRANIOTOMY HYPOPHYSECTOMY TRANSNASAL APPROACH;  Surgeon: Ophelia Charter, MD;  Location: [facility];  Service: Neurosurgery;  Laterality: Bilateral;  Transphenoidal resection of Pituitary tumor.   . Transnasal approach  04/18/2012    Procedure: TRANSNASAL APPROACH;  Surgeon: Rozetta Nunnery, MD;  Location: [facility];  Service: ENT;  Laterality: Bilateral;  Transnasal approach  . Laparoscopic right hemi colectomy  06/27/2012  Procedure: LAPAROSCOPIC RIGHT HEMI COLECTOMY;  Surgeon: Pedro Earls, MD;  Location: [facility];  Service: General;  Laterality: N/A;  Laparoscopic Assited Right Hemicolectomy    ROS: Constitutional: Denies fever, chills, weight loss/gain, headaches, insomnia, fatigue, night sweats, and change in appetite. Eyes: Denies redness, blurred vision, diplopia, discharge, itchy, watery eyes.  ENT: Denies discharge, congestion, post nasal drip, epistaxis, sore throat, earache, hearing loss, dental pain, Tinnitus, Vertigo, Sinus pain, snoring.  Cardio: Denies chest pain, palpitations, irregular heartbeat, syncope, dyspnea, diaphoresis, orthopnea, PND, claudication, edema Respiratory: denies cough, dyspnea, DOE, pleurisy, hoarseness, laryngitis, wheezing.  Gastrointestinal: Denies dysphagia, heartburn, reflux, water brash, pain, cramps, nausea, vomiting, bloating, diarrhea, constipation, hematemesis, melena, hematochezia, jaundice, hemorrhoids Genitourinary: Denies dysuria, frequency, urgency, nocturia, hesitancy, discharge, hematuria, flank pain Breast: Breast lumps, nipple discharge, bleeding.  Musculoskeletal: Denies arthralgia, myalgia, stiffness, Jt. Swelling, pain, limp, and strain/sprain. Denies falls. Skin: Denies puritis, rash, hives,  warts, acne, eczema, changing in skin lesion Neuro: No weakness, tremor, incoordination, spasms, paresthesia, pain Psychiatric: Denies confusion, memory loss, sensory loss. Denies Depression. Endocrine: Denies change in weight, skin, hair change, nocturia, and paresthesia, diabetic polys, visual blurring, hyper / hypo glycemic episodes.  Heme/Lymph: No excessive bleeding, bruising, enlarged lymph nodes  Objective:     BP 168/80  Pulse 56  Temp 97.7 F  Resp 16  Ht 5\' 4"    Wt 163 lb     BMI 27.97   General Appearance: Well nourished, alert, WD/WN, female and in no apparent distress. Eyes: PERRLA, EOMs, conjunctiva no swelling or erythema, normal fundi and vessels. Sinuses: No frontal/maxillary tenderness ENT/Mouth: EACs patent / TMs  nl. Nares clear without erythema, swelling, mucoid exudates. Oral hygiene is good. No erythema, swelling, or exudate. Tongue normal, non-obstructing. Tonsils not swollen or erythematous. Hearing normal.  Neck: Supple, thyroid normal. No bruits, nodes or JVD. Respiratory: Respiratory effort normal.  BS equal and clear bilateral without rales, rhonci, wheezing or stridor. Cardio: Heart sounds are normal with regular rate and rhythm and no murmurs, rubs or gallops. Peripheral pulses are normal and equal bilaterally without edema. No aortic or femoral bruits. Chest: symmetric with normal excursions and percussion. Breasts: Symmetric, without lumps, nipple discharge, retractions, or fibrocystic changes.  Abdomen: Flat, soft  with nl bowel sounds. Nontender, no guarding, rebound, hernias, masses, or organomegaly.  Lymphatics: Non tender without lymphadenopathy.  Genitourinary:  Musculoskeletal: Full ROM all peripheral extremities, joint stability, 5/5 strength, and normal gait. Skin: Warm and dry without rashes, lesions, cyanosis, clubbing or  ecchymosis.  Neuro: Cranial nerves intact, reflexes equal bilaterally. Normal muscle tone, no cerebellar symptoms.  Sensation intact.  Pysch: Alert and oriented X 3, normal affect, Insight and Judgment appropriate.   Cognitive Testing  Alert? Yes  Normal Appearance?Yes  Oriented to person? Yes  Place? Yes   Time? Yes  Recall of three objects?  Yes  Can perform simple calculations? Yes  Displays appropriate judgment? Yes  Can read the correct time from a watch/clock?Yes  Medicare Attestation I have personally reviewed: The patient's medical and social history Their use of alcohol, tobacco or illicit drugs Their current medications and supplements The patient's functional ability including ADLs,fall risks, home safety risks, cognitive, and hearing and visual impairment Diet and physical activities Evidence for depression or mood disorders  The patient's weight, height, BMI, and visual acuity have been recorded in the chart.  I have made referrals, counseling, and provided education to the patient based on review of the above and I have provided the patient  with a written personalized care plan for preventive services.  Over 40 minutes of exam, counseling, chart review was performed.  Marjani Kobel DAVID, MD   10/01/2014

## 2014-09-30 NOTE — Patient Instructions (Signed)
+++++++++++++++++++  Recommend Low dose or baby Aspirin 81 mg daily   To reduce risk of Colon Cancer 20 %, Skin Cancer 26 % , Melanoma 46% and   Pancreatic cancer 60%  ++++++++++++++++++++ Vitamin D goal is between 70-100.   Please make sure that you are taking your Vitamin D as directed.   It is very important as a natural antiinflammatory   helping hair, skin, and nails, as well as reducing stroke and heart attack risk.   It helps your bones and helps with mood.  It also decreases numerous cancer risks so please take it as directed.   Low Vit D is associated with a 200-300% higher risk for CANCER   and 200-300% higher risk for HEART   ATTACK  &  STROKE.    ................................................  It is also associated with higher death rate at younger ages,   autoimmune diseases like Rheumatoid arthritis, Lupus, Multiple Sclerosis.     Also many other serious conditions, like depression, Alzheimer's  Dementia, infertility, muscle aches, fatigue, fibromyalgia - just to name a few.  +++++++++++++++ Recommend the book "The END of DIETING" by Dr Joel Fuhrman   & the book "The END of DIABETES " by Dr Joel Fuhrman  At [employer] - get book & Audio CD's     Being diabetic has a  300% increased risk for heart attack, stroke, cancer, and alzheimer- type vascular dementia. It is very important that you work harder with diet by avoiding all foods that are white. Avoid white rice (Shane & wild rice is OK), white potatoes (sweetpotatoes in moderation is OK), White bread or wheat bread or anything made out of white flour like bagels, donuts, rolls, buns, biscuits, cakes, pastries, cookies, pizza crust, and pasta (made from white flour & egg whites) - vegetarian pasta or spinach or wheat pasta is OK. Multigrain breads like Arnold's or Pepperidge Farm, or multigrain sandwich thins or flatbreads.  Diet, exercise and weight loss can reverse and cure diabetes in the early stages.   Diet, exercise and weight loss is very important in the control and prevention of complications of diabetes which affects every system in your body, ie. Brain - dementia/stroke, eyes - glaucoma/blindness, heart - heart attack/heart failure, kidneys - dialysis, stomach - gastric paralysis, intestines - malabsorption, nerves - severe painful neuritis, circulation - gangrene & loss of a leg(s), and finally cancer and Alzheimers.    I recommend avoid fried & greasy foods,  sweets/candy, white rice (Boggess or wild rice or Quinoa is OK), white potatoes (sweet potatoes are OK) - anything made from white flour - bagels, doughnuts, rolls, buns, biscuits,white and wheat breads, pizza crust and traditional pasta made of white flour & egg white(vegetarian pasta or spinach or wheat pasta is OK).  Multi-grain bread is OK - like multi-grain flat bread or sandwich thins. Avoid alcohol in excess. Exercise is also important.    Eat all the vegetables you want - avoid meat, especially red meat and dairy - especially cheese.  Cheese is the most concentrated form of trans-fats which is the worst thing to clog up our arteries. Veggie cheese is OK which can be found in the fresh produce section at [employer] or [employer] or [employer]  ++++++++++++++++++++++++++++++++++ Preventive Care for Adults  A healthy lifestyle and preventive care can promote health and wellness. Preventive health guidelines for women include the following key practices.  A routine yearly physical is a good way to check with your health care provider about   your health and preventive screening. It is a chance to share any concerns and updates on your health and to receive a thorough exam.  Visit your dentist for a routine exam and preventive care every 6 months. Brush your teeth twice a day and floss once a day. Good oral hygiene prevents tooth decay and gum disease.  The frequency of eye exams is based on your age, health, family medical history,  use of contact lenses, and other factors. Follow your health care provider's recommendations for frequency of eye exams.  Eat a healthy diet. Foods like vegetables, fruits, whole grains, low-fat dairy products, and lean protein foods contain the nutrients you need without too many calories. Decrease your intake of foods high in solid fats, added sugars, and salt. Eat the right amount of calories for you.Get information about a proper diet from your health care provider, if necessary.  Regular physical exercise is one of the most important things you can do for your health. Most adults should get at least 150 minutes of moderate-intensity exercise (any activity that increases your heart rate and causes you to sweat) each week. In addition, most adults need muscle-strengthening exercises on 2 or more days a week.  Maintain a healthy weight. The body mass index (BMI) is a screening tool to identify possible weight problems. It provides an estimate of body fat based on height and weight. Your health care provider can find your BMI and can help you achieve or maintain a healthy weight.For adults 20 years and older:  A BMI below 18.5 is considered underweight.  A BMI of 18.5 to 24.9 is normal.  A BMI of 25 to 29.9 is considered overweight.  A BMI of 30 and above is considered obese.  Maintain normal blood lipids and cholesterol levels by exercising and minimizing your intake of saturated fat. Eat a balanced diet with plenty of fruit and vegetables. If your lipid or cholesterol levels are high, you are over 50, or you are at high risk for heart disease, you may need your cholesterol levels checked more frequently.Ongoing high lipid and cholesterol levels should be treated with medicines if diet and exercise are not working.  If you smoke, find out from your health care provider how to quit. If you do not use tobacco, do not start.  Lung cancer screening is recommended for adults aged 55-80 years who  are at high risk for developing lung cancer because of a history of smoking. A yearly low-dose CT scan of the lungs is recommended for people who have at least a 30-pack-year history of smoking and are a current smoker or have quit within the past 15 years. A pack year of smoking is smoking an average of 1 pack of cigarettes a day for 1 year (for example: 1 pack a day for 30 years or 2 packs a day for 15 years). Yearly screening should continue until the smoker has stopped smoking for at least 15 years. Yearly screening should be stopped for people who develop a health problem that would prevent them from having lung cancer treatment.  Avoid use of street drugs. Do not share needles with anyone. Ask for help if you need support or instructions about stopping the use of drugs.  High blood pressure causes heart disease and increases the risk of stroke.  Ongoing high blood pressure should be treated with medicines if weight loss and exercise do not work.  If you are 55-79 years old, ask your health care provider if   you should take aspirin to prevent strokes.  Diabetes screening involves taking a blood sample to check your fasting blood sugar level. This should be done once every 3 years, after age 45, if you are within normal weight and without risk factors for diabetes. Testing should be considered at a younger age or be carried out more frequently if you are overweight and have at least 1 risk factor for diabetes.  Breast cancer screening is essential preventive care for women. You should practice "breast self-awareness." This means understanding the normal appearance and feel of your breasts and may include breast self-examination. Any changes detected, no matter how small, should be reported to a health care provider. Women in their 20s and 30s should have a clinical breast exam (CBE) by a health care provider as part of a regular health exam every 1 to 3 years. After age 40, women should have a CBE every  year. Starting at age 40, women should consider having a mammogram (breast X-ray test) every year. Women who have a family history of breast cancer should talk to their health care provider about genetic screening. Women at a high risk of breast cancer should talk to their health care providers about having an MRI and a mammogram every year.  Breast cancer gene (BRCA)-related cancer risk assessment is recommended for women who have family members with BRCA-related cancers. BRCA-related cancers include breast, ovarian, tubal, and peritoneal cancers. Having family members with these cancers may be associated with an increased risk for harmful changes (mutations) in the breast cancer genes BRCA1 and BRCA2. Results of the assessment will determine the need for genetic counseling and BRCA1 and BRCA2 testing.  Routine pelvic exams to screen for cancer are no longer recommended for nonpregnant women who are considered low risk for cancer of the pelvic organs (ovaries, uterus, and vagina) and who do not have symptoms. Ask your health care provider if a screening pelvic exam is right for you.  If you have had past treatment for cervical cancer or a condition that could lead to cancer, you need Pap tests and screening for cancer for at least 20 years after your treatment. If Pap tests have been discontinued, your risk factors (such as having a new sexual partner) need to be reassessed to determine if screening should be resumed. Some women have medical problems that increase the chance of getting cervical cancer. In these cases, your health care provider may recommend more frequent screening and Pap tests.    Colorectal cancer can be detected and often prevented. Most routine colorectal cancer screening begins at the age of 50 years and continues through age 75 years. However, your health care provider may recommend screening at an earlier age if you have risk factors for colon cancer. On a yearly basis, your health  care provider may provide home test kits to check for hidden blood in the stool. Use of a small camera at the end of a tube, to directly examine the colon (sigmoidoscopy or colonoscopy), can detect the earliest forms of colorectal cancer. Talk to your health care provider about this at age 50, when routine screening begins. Direct exam of the colon should be repeated every 5-10 years through age 75 years, unless early forms of pre-cancerous polyps or small growths are found.  Osteoporosis is a disease in which the bones lose minerals and strength with aging. This can result in serious bone fractures or breaks. The risk of osteoporosis can be identified using a bone density scan.   Women ages 65 years and over and women at risk for fractures or osteoporosis should discuss screening with their health care providers. Ask your health care provider whether you should take a calcium supplement or vitamin D to reduce the rate of osteoporosis.  Menopause can be associated with physical symptoms and risks. Hormone replacement therapy is available to decrease symptoms and risks. You should talk to your health care provider about whether hormone replacement therapy is right for you.  Use sunscreen. Apply sunscreen liberally and repeatedly throughout the day. You should seek shade when your shadow is shorter than you. Protect yourself by wearing long sleeves, pants, a wide-brimmed hat, and sunglasses year round, whenever you are outdoors.  Once a month, do a whole body skin exam, using a mirror to look at the skin on your back. Tell your health care provider of new moles, moles that have irregular borders, moles that are larger than a pencil eraser, or moles that have changed in shape or color.  Stay current with required vaccines (immunizations).  Influenza vaccine. All adults should be immunized every year.  Tetanus, diphtheria, and acellular pertussis (Td, Tdap) vaccine. Pregnant women should receive 1 dose of  Tdap vaccine during each pregnancy. The dose should be obtained regardless of the length of time since the last dose. Immunization is preferred during the 27th-36th week of gestation. An adult who has not previously received Tdap or who does not know her vaccine status should receive 1 dose of Tdap. This initial dose should be followed by tetanus and diphtheria toxoids (Td) booster doses every 10 years. Adults with an unknown or incomplete history of completing a 3-dose immunization series with Td-containing vaccines should begin or complete a primary immunization series including a Tdap dose. Adults should receive a Td booster every 10 years.    Zoster vaccine. One dose is recommended for adults aged 60 years or older unless certain conditions are present.    Pneumococcal 13-valent conjugate (PCV13) vaccine. When indicated, a person who is uncertain of her immunization history and has no record of immunization should receive the PCV13 vaccine. An adult aged 19 years or older who has certain medical conditions and has not been previously immunized should receive 1 dose of PCV13 vaccine. This PCV13 should be followed with a dose of pneumococcal polysaccharide (PPSV23) vaccine. The PPSV23 vaccine dose should be obtained at least 8 weeks after the dose of PCV13 vaccine. An adult aged 19 years or older who has certain medical conditions and previously received 1 or more doses of PPSV23 vaccine should receive 1 dose of PCV13. The PCV13 vaccine dose should be obtained 1 or more years after the last PPSV23 vaccine dose.    Pneumococcal polysaccharide (PPSV23) vaccine. When PCV13 is also indicated, PCV13 should be obtained first. All adults aged 65 years and older should be immunized. An adult younger than age 65 years who has certain medical conditions should be immunized. Any person who resides in a nursing home or long-term care facility should be immunized. An adult smoker should be immunized. People with an  immunocompromised condition and certain other conditions should receive both PCV13 and PPSV23 vaccines. People with human immunodeficiency virus (HIV) infection should be immunized as soon as possible after diagnosis. Immunization during chemotherapy or radiation therapy should be avoided. Routine use of PPSV23 vaccine is not recommended for American Indians, [place] Natives, or people younger than 65 years unless there are medical conditions that require PPSV23 vaccine. When indicated, people who have unknown   immunization and have no record of immunization should receive PPSV23 vaccine. One-time revaccination 5 years after the first dose of PPSV23 is recommended for people aged 19-64 years who have chronic kidney failure, nephrotic syndrome, asplenia, or immunocompromised conditions. People who received 1-2 doses of PPSV23 before age 65 years should receive another dose of PPSV23 vaccine at age 65 years or later if at least 5 years have passed since the previous dose. Doses of PPSV23 are not needed for people immunized with PPSV23 at or after age 65 years.   Preventive Services / Frequency  Ages 65 years and over  Blood pressure check.  Lipid and cholesterol check.  Lung cancer screening. / Every year if you are aged 55-80 years and have a 30-pack-year history of smoking and currently smoke or have quit within the past 15 years. Yearly screening is stopped once you have quit smoking for at least 15 years or develop a health problem that would prevent you from having lung cancer treatment.  Clinical breast exam.** / Every year after age 40 years.  BRCA-related cancer risk assessment.** / For women who have family members with a BRCA-related cancer (breast, ovarian, tubal, or peritoneal cancers).  Mammogram.** / Every year beginning at age 40 years and continuing for as long as you are in good health. Consult with your health care provider.  Pap test.** / Every 3 years starting at age 30 years  through age 65 or 70 years with 3 consecutive normal Pap tests. Testing can be stopped between 65 and 70 years with 3 consecutive normal Pap tests and no abnormal Pap or HPV tests in the past 10 years.  Fecal occult blood test (FOBT) of stool. / Every year beginning at age 50 years and continuing until age 75 years. You may not need to do this test if you get a colonoscopy every 10 years.  Flexible sigmoidoscopy or colonoscopy.** / Every 5 years for a flexible sigmoidoscopy or every 10 years for a colonoscopy beginning at age 50 years and continuing until age 75 years.  Hepatitis C blood test.** / For all people born from 1945 through 1965 and any individual with known risks for hepatitis C.  Osteoporosis screening.** / A one-time screening for women ages 65 years and over and women at risk for fractures or osteoporosis.  Skin self-exam. / Monthly.  Influenza vaccine. / Every year.  Tetanus, diphtheria, and acellular pertussis (Tdap/Td) vaccine.** / 1 dose of Td every 10 years.  Zoster vaccine.** / 1 dose for adults aged 60 years or older.  Pneumococcal 13-valent conjugate (PCV13) vaccine.** / Consult your health care provider.  Pneumococcal polysaccharide (PPSV23) vaccine.** / 1 dose for all adults aged 65 years and older. Screening for abdominal aortic aneurysm (AAA)  by ultrasound is recommended for people who have history of high blood pressure or who are current or former smokers. 

## 2014-10-01 LAB — BASIC METABOLIC PANEL WITH GFR
BUN: 10 mg/dL (ref 6–23)
CO2: 27 mEq/L (ref 19–32)
CREATININE: 0.58 mg/dL (ref 0.50–1.10)
Calcium: 9.7 mg/dL (ref 8.4–10.5)
Chloride: 104 mEq/L (ref 96–112)
GFR, Est Non African American: >89 mL/min
Glucose, Bld: 81 mg/dL (ref 70–99)
POTASSIUM: 3.9 meq/L (ref 3.5–5.3)
SODIUM: 140 meq/L (ref 135–145)

## 2014-10-01 LAB — URINALYSIS, MICROSCOPIC ONLY
Bacteria, UA: NONE SEEN
Casts: NONE SEEN
Crystals: NONE SEEN
SQUAMOUS EPITHELIAL / LPF: NONE SEEN

## 2014-10-01 LAB — HEPATIC FUNCTION PANEL
ALT: 17 U/L (ref 0–35)
AST: 14 U/L (ref 0–37)
Albumin: 4.1 g/dL (ref 3.5–5.2)
Alkaline Phosphatase: 66 U/L (ref 39–117)
BILIRUBIN DIRECT: 0.1 mg/dL (ref 0.0–0.3)
BILIRUBIN INDIRECT: 0.5 mg/dL (ref 0.2–1.2)
Total Bilirubin: 0.6 mg/dL (ref 0.2–1.2)
Total Protein: 6.8 g/dL (ref 6.0–8.3)

## 2014-10-01 LAB — MAGNESIUM: Magnesium: 1.7 mg/dL (ref 1.5–2.5)

## 2014-10-01 LAB — MICROALBUMIN / CREATININE URINE RATIO
Creatinine, Urine: 111.1 mg/dL
MICROALB UR: 2.1 mg/dL — AB (ref <2.0)
MICROALB/CREAT RATIO: 18.9 mg/g (ref 0.0–30.0)

## 2014-10-01 LAB — TSH: TSH: 1.263 u[IU]/mL (ref 0.350–4.500)

## 2014-10-01 LAB — LIPID PANEL
CHOL/HDL RATIO: 4.1 ratio
CHOLESTEROL: 175 mg/dL (ref 0–200)
HDL: 43 mg/dL — ABNORMAL LOW (ref >=46)
LDL Cholesterol: 97 mg/dL (ref 0–99)
Triglycerides: 175 mg/dL — ABNORMAL HIGH (ref <150)
VLDL: 35 mg/dL (ref 0–40)

## 2014-10-01 LAB — INSULIN, RANDOM: Insulin: 4.7 u[IU]/mL (ref 2.0–19.6)

## 2014-10-01 LAB — VITAMIN D 25 HYDROXY (VIT D DEFICIENCY, FRACTURES): Vit D, 25-Hydroxy: 52 ng/mL (ref 30–100)

## 2014-10-01 LAB — HEMOGLOBIN A1C
HEMOGLOBIN A1C: 6 % — AB (ref <5.7)
Mean Plasma Glucose: 126 mg/dL — ABNORMAL HIGH (ref <117)

## 2014-10-01 NOTE — Addendum Note (Signed)
Addended by: Melbourne Abts C on: 10/01/2014 09:46 AM   Modules accepted: Orders

## 2014-10-09 ENCOUNTER — Other Ambulatory Visit: Payer: Self-pay | Admitting: *Deleted

## 2014-10-09 MED ORDER — LISINOPRIL 10 MG PO TABS: 10.0000 mg | ORAL_TABLET | Freq: Every day | ORAL | Status: DC

## 2014-10-09 MED ORDER — ATORVASTATIN CALCIUM 80 MG PO TABS: ORAL_TABLET | ORAL | Status: DC

## 2014-10-09 MED ORDER — PAROXETINE HCL 20 MG PO TABS: 20.0000 mg | ORAL_TABLET | Freq: Every day | ORAL | Status: DC

## 2014-10-13 ENCOUNTER — Other Ambulatory Visit: Payer: Medicare Other | Admitting: Internal Medicine

## 2014-10-13 DIAGNOSIS — I10 Essential (primary) hypertension: Secondary | ICD-10-CM

## 2014-12-02 ENCOUNTER — Other Ambulatory Visit (HOSPITAL_COMMUNITY): Payer: Self-pay | Admitting: Neurosurgery

## 2014-12-02 DIAGNOSIS — D352 Benign neoplasm of pituitary gland: Secondary | ICD-10-CM

## 2014-12-22 ENCOUNTER — Ambulatory Visit (HOSPITAL_COMMUNITY): Admission: RE | Admit: 2014-12-22 | Payer: Medicare Other | Source: Ambulatory Visit

## 2014-12-25 DIAGNOSIS — R69 Illness, unspecified: Secondary | ICD-10-CM | POA: Diagnosis not present

## 2015-01-09 ENCOUNTER — Ambulatory Visit: Payer: Self-pay | Admitting: Internal Medicine

## 2015-01-21 ENCOUNTER — Encounter: Payer: Self-pay | Admitting: Internal Medicine

## 2015-01-21 ENCOUNTER — Ambulatory Visit (INDEPENDENT_AMBULATORY_CARE_PROVIDER_SITE_OTHER): Payer: Medicare Other | Admitting: Internal Medicine

## 2015-01-21 VITALS — BP 132/78 | HR 68 | Temp 98.4°F | Resp 18 | Ht 64.0 in | Wt 168.0 lb

## 2015-01-21 DIAGNOSIS — E559 Vitamin D deficiency, unspecified: Secondary | ICD-10-CM

## 2015-01-21 DIAGNOSIS — R7309 Other abnormal glucose: Secondary | ICD-10-CM

## 2015-01-21 DIAGNOSIS — Z79899 Other long term (current) drug therapy: Secondary | ICD-10-CM | POA: Diagnosis not present

## 2015-01-21 DIAGNOSIS — E782 Mixed hyperlipidemia: Secondary | ICD-10-CM | POA: Diagnosis not present

## 2015-01-21 DIAGNOSIS — I10 Essential (primary) hypertension: Secondary | ICD-10-CM | POA: Diagnosis not present

## 2015-01-21 DIAGNOSIS — R7303 Prediabetes: Secondary | ICD-10-CM

## 2015-01-21 LAB — CBC WITH DIFFERENTIAL/PLATELET
Basophils Absolute: 0.1 10*3/uL (ref 0.0–0.1)
Basophils Relative: 1 % (ref 0–1)
EOS ABS: 0.2 10*3/uL (ref 0.0–0.7)
EOS PCT: 3 % (ref 0–5)
HCT: 37.3 % (ref 36.0–46.0)
Hemoglobin: 12.6 g/dL (ref 12.0–15.0)
Lymphocytes Relative: 23 % (ref 12–46)
Lymphs Abs: 1.8 10*3/uL (ref 0.7–4.0)
MCH: 28.8 pg (ref 26.0–34.0)
MCHC: 33.8 g/dL (ref 30.0–36.0)
MCV: 85.2 fL (ref 78.0–100.0)
MPV: 10.1 fL (ref 8.6–12.4)
Monocytes Absolute: 0.5 10*3/uL (ref 0.1–1.0)
Monocytes Relative: 6 % (ref 3–12)
Neutro Abs: 5.2 10*3/uL (ref 1.7–7.7)
Neutrophils Relative %: 67 % (ref 43–77)
PLATELETS: 256 10*3/uL (ref 150–400)
RBC: 4.38 MIL/uL (ref 3.87–5.11)
RDW: 13.8 % (ref 11.5–15.5)
WBC: 7.8 10*3/uL (ref 4.0–10.5)

## 2015-01-21 LAB — BASIC METABOLIC PANEL WITH GFR
BUN: 15 mg/dL (ref 7–25)
CO2: 25 mmol/L (ref 20–31)
Calcium: 9.8 mg/dL (ref 8.6–10.4)
Chloride: 105 mmol/L (ref 98–110)
Creat: 0.57 mg/dL — ABNORMAL LOW (ref 0.60–0.93)
Glucose, Bld: 90 mg/dL (ref 65–99)
Potassium: 4.3 mmol/L (ref 3.5–5.3)
Sodium: 141 mmol/L (ref 135–146)

## 2015-01-21 LAB — HEPATIC FUNCTION PANEL
ALBUMIN: 4 g/dL (ref 3.6–5.1)
ALK PHOS: 71 U/L (ref 33–130)
ALT: 17 U/L (ref 6–29)
AST: 18 U/L (ref 10–35)
BILIRUBIN INDIRECT: 0.6 mg/dL (ref 0.2–1.2)
BILIRUBIN TOTAL: 0.8 mg/dL (ref 0.2–1.2)
Bilirubin, Direct: 0.2 mg/dL (ref <=0.2)
Total Protein: 6.7 g/dL (ref 6.1–8.1)

## 2015-01-21 LAB — TSH: TSH: 1.622 u[IU]/mL (ref 0.350–4.500)

## 2015-01-21 LAB — LIPID PANEL
Cholesterol: 129 mg/dL (ref 125–200)
HDL: 42 mg/dL — ABNORMAL LOW (ref >=46)
LDL Cholesterol: 64 mg/dL (ref <130)
TRIGLYCERIDES: 115 mg/dL (ref <150)
Total CHOL/HDL Ratio: 3.1 Ratio (ref <=5.0)
VLDL: 23 mg/dL (ref <30)

## 2015-01-21 LAB — MAGNESIUM: Magnesium: 1.9 mg/dL (ref 1.5–2.5)

## 2015-01-21 NOTE — Progress Notes (Signed)
Patient ID: Kristen [name], female   DOB: 23-May-1941, 74 y.o.   MRN: [id number]  Assessment and Plan:  Hypertension:  -Continue medication,  -monitor blood pressure at home.  -Continue DASH diet.   -Reminder to go to the ER if any CP, SOB, nausea, dizziness, severe HA, changes vision/speech, left arm numbness and tingling, and jaw pain.  Cholesterol: -cut to 1/2 three days per week due to side effects -Continue diet and exercise.  -Check cholesterol.   Pre-diabetes: -Continue diet and exercise.  -Check A1C  Vitamin D Def: -check level -continue medications.   Pituitary tumor -go to ER if severe headache or loss of vision -ask Dr. Zadie Rhine and Dr. Arnoldo Morale to send office notes to [place]  Continue diet and meds as discussed. Further disposition pending results of labs.  HPI 74 y.o. female  presents for 3 month follow up with hypertension, hyperlipidemia, prediabetes and vitamin D.   Her blood pressure has been controlled at home, today their BP is BP: 132/78 mmHg.   She does not workout. She denies chest pain, shortness of breath, dizziness.  She reports that her blood pressure is usually around 143/70.  She reports that she has to put a dog down tomorrow and she is upset.    She is on cholesterol medication and denies myalgias. Her cholesterol is at goal. The cholesterol last visit was:   Lab Results  Component Value Date   CHOL 175 09/30/2014   HDL 43* 09/30/2014   LDLCALC 97 09/30/2014   TRIG 175* 09/30/2014   CHOLHDL 4.1 09/30/2014     She has not been working on diet and exercise for prediabetes, and denies foot ulcerations, hyperglycemia, hypoglycemia , increased appetite, nausea, paresthesia of the feet, polydipsia, polyuria, vomiting and weight loss. Last A1C in the office was:  Lab Results  Component Value Date   HGBA1C 6.0* 09/30/2014    Patient is on Vitamin D supplement.  Lab Results  Component Value Date   VD25OH 34 09/30/2014     She has been seeing Dr.  Arnoldo Morale for her pituitary tumor recurrence.  She reports that it is about 1 cm.  She is due to see the opthalmologist in 2 weeks so that she can have some visual field testing performed.  She is due to see Dr. Zadie Rhine.    Current Medications:  Current Outpatient Prescriptions on File Prior to Visit  Medication Sig Dispense Refill  . aspirin 81 MG tablet Take 81 mg by mouth daily.    Marland Kitchen atorvastatin (LIPITOR) 80 MG tablet 1 tab daily or as directed for cholesterol 30 tablet 3  . Cholecalciferol (VITAMIN D PO) Take 2,000 Units by mouth 3 (three) times daily.    Marland Kitchen lisinopril (PRINIVIL,ZESTRIL) 10 MG tablet Take 1 tablet (10 mg total) by mouth daily. 90 tablet 1  . PARoxetine (PAXIL) 20 MG tablet Take 1 tablet (20 mg total) by mouth daily. 90 tablet 1  . ranitidine (ZANTAC) 300 MG capsule Take 300 mg by mouth daily as needed for heartburn.      No current facility-administered medications on file prior to visit.    Medical History:  Past Medical History  Diagnosis Date  . Shortness of breath     exertion  . Pneumonia     hx of pneumonia  . GERD (gastroesophageal reflux disease)   . Headache(784.0)     hx migraines  . Depression   . Colon polyps   . Arthritis   . Hypertension  pt states she has not taken any b/p medication since her brain surgery in oct 3013--took herself off the medication  . Dysrhythmia 04/2012    SVT - RATE 145 POST OP CRANIOTOMY SURGERY --TX'D AND RESOLVED--PT STATES NO FURTHER PROBLEM THAT SHE IS AWARE OF -SHE HAS NOT HAD TO SEE CARDIOLOGIST  . Pituitary mass NOV 2013    MASS FOUND AFTER PT EXPERIENCED BILATERAL BLURRED / HAZY VISION -ESP LEFT EYE.  S/P CRANIOTOMY AND EYESIGHT RETURNED TO NORMAL--PT STILL HAS A LOT OF TENDERNESS RIGHT SIDE OF NOSE --THE CRANIOTOMY WAS DONE BY TRANSNASAL APPROACH  . PONV (postoperative nausea and vomiting)     PT STATES PLEASE NOTE SHE HAS TENDERNESS RIGHT SIDE OF NOSE - SINCE TRANSNASAL CRANIOTOMY 04/2012  . Colon cancer 1990s,  2014  . Vitamin D deficiency   . Prediabetes     Allergies:  Allergies  Allergen Reactions  . Aspirin     High Doses  . Codeine Nausea And Vomiting  . Fenofibrate Cough  . Hydrochlorothiazide     Fatigue  . Metoclopramide     Sedation     Review of Systems:  Review of Systems  Constitutional: Negative for fever, chills and malaise/fatigue.  HENT: Negative for congestion, hearing loss and sore throat.   Eyes: Positive for blurred vision.  Respiratory: Negative for cough, shortness of breath and wheezing.   Cardiovascular: Negative for chest pain, palpitations and leg swelling.  Gastrointestinal: Negative for abdominal pain, diarrhea, constipation, blood in stool and melena.  Genitourinary: Negative.   Skin: Negative.   Neurological: Positive for dizziness and headaches. Negative for sensory change and loss of consciousness.  Psychiatric/Behavioral: Negative for depression. The patient has insomnia. The patient is not nervous/anxious.     Family history- Review and unchanged  Social history- Review and unchanged  Physical Exam: BP 132/78 mmHg  Pulse 68  Temp(Src) 98.4 F (36.9 C) (Temporal)  Resp 18  Ht 5\' 4"  (1.626 m)  Wt 168 lb (76.204 kg)  BMI 28.82 kg/m2 Wt Readings from Last 3 Encounters:  01/21/15 168 lb (76.204 kg)  09/30/14 163 lb (73.936 kg)  08/19/14 163 lb 6.4 oz (74.118 kg)    General Appearance: Well nourished well developed, in no apparent distress. Eyes: PERRLA, EOMs, conjunctiva no swelling or erythema ENT/Mouth: Ear canals normal without obstruction, swelling, erythma, discharge.  TMs normal bilaterally.  Oropharynx moist, clear, without exudate, or postoropharyngeal swelling. Neck: Supple, thyroid normal,no cervical adenopathy  Respiratory: Respiratory effort normal, Breath sounds clear A&P without rhonchi, wheeze, or rale.  No retractions, no accessory usage. Cardio: RRR with no MRGs. Brisk peripheral pulses without edema.  Abdomen: Soft, +  BS,  Non tender, no guarding, rebound, hernias, masses. Musculoskeletal: Full ROM, 5/5 strength, Normal gait Skin: Warm, dry without rashes, lesions, ecchymosis.  Neuro: Awake and oriented X 3, Cranial nerves intact. Normal muscle tone, no cerebellar symptoms. Psych: Normal affect, Insight and Judgment appropriate.    Starlyn Skeans, PA-C 3:19 PM [facility]

## 2015-01-21 NOTE — Patient Instructions (Signed)
Please start taking the atorvastatin or your cholesterol pill half a tablet T,TH, S.    Please start taking the paxil everyday to achieve maximum effectiveness.  Make sure that Dr. Zadie Rhine sends [place] his notes so that we continue to stay in the loop with what is going on.

## 2015-01-22 LAB — HEMOGLOBIN A1C
Hgb A1c MFr Bld: 6.2 % — ABNORMAL HIGH (ref <5.7)
Mean Plasma Glucose: 131 mg/dL — ABNORMAL HIGH (ref <117)

## 2015-01-22 LAB — VITAMIN D 25 HYDROXY (VIT D DEFICIENCY, FRACTURES): VIT D 25 HYDROXY: 73 ng/mL (ref 30–100)

## 2015-01-22 LAB — INSULIN, RANDOM: Insulin: 7.1 u[IU]/mL (ref 2.0–19.6)

## 2015-02-23 DIAGNOSIS — H5340 Unspecified visual field defects: Secondary | ICD-10-CM | POA: Diagnosis not present

## 2015-02-23 DIAGNOSIS — H43393 Other vitreous opacities, bilateral: Secondary | ICD-10-CM | POA: Diagnosis not present

## 2015-02-23 DIAGNOSIS — H5347 Heteronymous bilateral field defects: Secondary | ICD-10-CM | POA: Diagnosis not present

## 2015-02-23 DIAGNOSIS — D352 Benign neoplasm of pituitary gland: Secondary | ICD-10-CM | POA: Diagnosis not present

## 2015-02-23 LAB — HM DIABETES EYE EXAM

## 2015-02-27 DIAGNOSIS — H5347 Heteronymous bilateral field defects: Secondary | ICD-10-CM | POA: Diagnosis not present

## 2015-04-17 ENCOUNTER — Ambulatory Visit: Payer: Self-pay | Admitting: Internal Medicine

## 2015-04-21 DIAGNOSIS — D352 Benign neoplasm of pituitary gland: Secondary | ICD-10-CM | POA: Diagnosis not present

## 2015-04-23 DIAGNOSIS — R51 Headache: Secondary | ICD-10-CM | POA: Diagnosis not present

## 2015-04-23 DIAGNOSIS — D352 Benign neoplasm of pituitary gland: Secondary | ICD-10-CM | POA: Diagnosis not present

## 2015-04-23 DIAGNOSIS — R220 Localized swelling, mass and lump, head: Secondary | ICD-10-CM | POA: Diagnosis not present

## 2015-04-29 ENCOUNTER — Other Ambulatory Visit: Payer: Self-pay

## 2015-04-29 MED ORDER — LISINOPRIL 10 MG PO TABS: 10.0000 mg | ORAL_TABLET | Freq: Every day | ORAL | Status: DC

## 2015-07-07 DIAGNOSIS — Z6827 Body mass index (BMI) 27.0-27.9, adult: Secondary | ICD-10-CM | POA: Diagnosis not present

## 2015-07-07 DIAGNOSIS — D352 Benign neoplasm of pituitary gland: Secondary | ICD-10-CM | POA: Diagnosis not present

## 2015-07-07 DIAGNOSIS — I10 Essential (primary) hypertension: Secondary | ICD-10-CM | POA: Diagnosis not present

## 2015-09-22 ENCOUNTER — Other Ambulatory Visit: Payer: Self-pay | Admitting: *Deleted

## 2015-09-22 ENCOUNTER — Other Ambulatory Visit: Payer: Self-pay

## 2015-09-22 DIAGNOSIS — H5347 Heteronymous bilateral field defects: Secondary | ICD-10-CM | POA: Diagnosis not present

## 2015-09-22 DIAGNOSIS — H35371 Puckering of macula, right eye: Secondary | ICD-10-CM | POA: Diagnosis not present

## 2015-09-22 DIAGNOSIS — H43393 Other vitreous opacities, bilateral: Secondary | ICD-10-CM | POA: Diagnosis not present

## 2015-09-22 DIAGNOSIS — H43392 Other vitreous opacities, left eye: Secondary | ICD-10-CM | POA: Diagnosis not present

## 2015-09-22 MED ORDER — LISINOPRIL 10 MG PO TABS: 10.0000 mg | ORAL_TABLET | Freq: Every day | ORAL | Status: DC

## 2015-09-22 MED ORDER — PAROXETINE HCL 20 MG PO TABS: 20.0000 mg | ORAL_TABLET | Freq: Every day | ORAL | Status: DC

## 2015-09-30 ENCOUNTER — Emergency Department (HOSPITAL_COMMUNITY): Payer: Medicare Other

## 2015-09-30 ENCOUNTER — Encounter (HOSPITAL_COMMUNITY): Payer: Self-pay | Admitting: *Deleted

## 2015-09-30 ENCOUNTER — Observation Stay (HOSPITAL_COMMUNITY)
Admission: EM | Admit: 2015-09-30 | Discharge: 2015-10-01 | Disposition: A | Discharge status: Home / Self Care | Payer: Medicare Other | Attending: Urology | Admitting: Urology

## 2015-09-30 DIAGNOSIS — R1084 Generalized abdominal pain: Secondary | ICD-10-CM | POA: Diagnosis not present

## 2015-09-30 DIAGNOSIS — F329 Major depressive disorder, single episode, unspecified: Secondary | ICD-10-CM | POA: Diagnosis not present

## 2015-09-30 DIAGNOSIS — Z9889 Other specified postprocedural states: Secondary | ICD-10-CM | POA: Insufficient documentation

## 2015-09-30 DIAGNOSIS — E785 Hyperlipidemia, unspecified: Secondary | ICD-10-CM | POA: Insufficient documentation

## 2015-09-30 DIAGNOSIS — R1032 Left lower quadrant pain: Secondary | ICD-10-CM | POA: Diagnosis not present

## 2015-09-30 DIAGNOSIS — I10 Essential (primary) hypertension: Secondary | ICD-10-CM | POA: Diagnosis not present

## 2015-09-30 DIAGNOSIS — E559 Vitamin D deficiency, unspecified: Secondary | ICD-10-CM | POA: Insufficient documentation

## 2015-09-30 DIAGNOSIS — Z85038 Personal history of other malignant neoplasm of large intestine: Secondary | ICD-10-CM | POA: Insufficient documentation

## 2015-09-30 DIAGNOSIS — R112 Nausea with vomiting, unspecified: Secondary | ICD-10-CM | POA: Diagnosis not present

## 2015-09-30 DIAGNOSIS — N132 Hydronephrosis with renal and ureteral calculous obstruction: Secondary | ICD-10-CM | POA: Diagnosis present

## 2015-09-30 DIAGNOSIS — R1033 Periumbilical pain: Secondary | ICD-10-CM | POA: Diagnosis not present

## 2015-09-30 DIAGNOSIS — N201 Calculus of ureter: Secondary | ICD-10-CM | POA: Diagnosis not present

## 2015-09-30 DIAGNOSIS — Z7982 Long term (current) use of aspirin: Secondary | ICD-10-CM | POA: Diagnosis not present

## 2015-09-30 DIAGNOSIS — Z79899 Other long term (current) drug therapy: Secondary | ICD-10-CM | POA: Insufficient documentation

## 2015-09-30 DIAGNOSIS — K219 Gastro-esophageal reflux disease without esophagitis: Secondary | ICD-10-CM | POA: Insufficient documentation

## 2015-09-30 DIAGNOSIS — R197 Diarrhea, unspecified: Secondary | ICD-10-CM | POA: Diagnosis not present

## 2015-09-30 DIAGNOSIS — K2970 Gastritis, unspecified, without bleeding: Secondary | ICD-10-CM | POA: Diagnosis not present

## 2015-09-30 LAB — COMPREHENSIVE METABOLIC PANEL
ALBUMIN: 3.8 g/dL (ref 3.5–5.0)
ALK PHOS: 73 U/L (ref 38–126)
ALT: 18 U/L (ref 14–54)
AST: 18 U/L (ref 15–41)
Anion gap: 9 (ref 5–15)
BUN: 15 mg/dL (ref 6–20)
CALCIUM: 10.1 mg/dL (ref 8.9–10.3)
CHLORIDE: 110 mmol/L (ref 101–111)
CO2: 27 mmol/L (ref 22–32)
CREATININE: 0.75 mg/dL (ref 0.44–1.00)
GFR calc non Af Amer: >60 mL/min (ref >60)
GLUCOSE: 136 mg/dL — AB (ref 65–99)
Potassium: 4.1 mmol/L (ref 3.5–5.1)
SODIUM: 146 mmol/L — AB (ref 135–145)
Total Bilirubin: 0.7 mg/dL (ref 0.3–1.2)
Total Protein: 6.7 g/dL (ref 6.5–8.1)

## 2015-09-30 LAB — URINALYSIS, ROUTINE W REFLEX MICROSCOPIC
BILIRUBIN URINE: NEGATIVE
GLUCOSE, UA: NEGATIVE mg/dL
Ketones, ur: NEGATIVE mg/dL
Nitrite: NEGATIVE
PROTEIN: 30 mg/dL — AB
SPECIFIC GRAVITY, URINE: 1.018 (ref 1.005–1.030)
pH: 5.5 (ref 5.0–8.0)

## 2015-09-30 LAB — URINE MICROSCOPIC-ADD ON

## 2015-09-30 LAB — CBC
HCT: 37.5 % (ref 36.0–46.0)
Hemoglobin: 12.1 g/dL (ref 12.0–15.0)
MCH: 28 pg (ref 26.0–34.0)
MCHC: 32.3 g/dL (ref 30.0–36.0)
MCV: 86.8 fL (ref 78.0–100.0)
PLATELETS: 237 10*3/uL (ref 150–400)
RBC: 4.32 MIL/uL (ref 3.87–5.11)
RDW: 12.9 % (ref 11.5–15.5)
WBC: 9.1 10*3/uL (ref 4.0–10.5)

## 2015-09-30 LAB — LIPASE, BLOOD: LIPASE: 37 U/L (ref 11–51)

## 2015-09-30 MED ORDER — ONDANSETRON 4 MG PO TBDP
ORAL_TABLET | ORAL | Status: AC
  Filled 2015-09-30: qty 1

## 2015-09-30 MED ORDER — IOPAMIDOL (ISOVUE-300) INJECTION 61%
INTRAVENOUS | Status: AC
  Administered 2015-09-30: 100 mL
  Filled 2015-09-30: qty 100

## 2015-09-30 MED ORDER — FENTANYL CITRATE (PF) 100 MCG/2ML IJ SOLN
50.0000 ug | Freq: Once | INTRAMUSCULAR | Status: AC
  Administered 2015-09-30: 50 ug via INTRAVENOUS
  Filled 2015-09-30: qty 2

## 2015-09-30 MED ORDER — ONDANSETRON 4 MG PO TBDP
4.0000 mg | ORAL_TABLET | Freq: Once | ORAL | Status: AC | PRN
  Administered 2015-09-30: 4 mg via ORAL

## 2015-09-30 NOTE — ED Notes (Signed)
Pt ambulated to and from restroom with this RN without difficulty.   

## 2015-09-30 NOTE — ED Notes (Signed)
Patient transported to CT 

## 2015-09-30 NOTE — ED Provider Notes (Signed)
CSN: [id number]     Arrival date & time 09/30/15  1703 History   First MD Initiated Contact with Patient 09/30/15 1958     Chief Complaint  Patient presents with  . Abdominal Pain     (Consider location/radiation/quality/duration/timing/severity/associated sxs/prior Treatment) HPI This is an otherwise healthy 75 year old female that presents emergency for evaluation of abdominal pain. She says that approximately 2 weeks ago she had a very similar episode where she had mid abdominal pain, located near the umbilicus, without radiation. She says that the pain was severe, caused nausea and vomiting, and had associated diarrhea. This resolved on its own after a couple of days. She presents emergency department today complaining of similar abdominal pain that she says is in the periumbilical area, with some radiation to the left lower quadrant. She denies any dysuria, she has not noticed any hematuria, and has not had any flank pain. She has vomited several times today, reports that it was Gahm appearing material. The also has had several loose stools today as well, but denies frequent watery diarrhea. She has had no fever, no chills.   Past Medical History  Diagnosis Date  . Shortness of breath     exertion  . Pneumonia     hx of pneumonia  . GERD (gastroesophageal reflux disease)   . Headache(784.0)     hx migraines  . Depression   . Colon polyps   . Arthritis   . Hypertension     pt states she has not taken any b/p medication since her brain surgery in oct 3013--took herself off the medication  . Dysrhythmia 04/2012    SVT - RATE 145 POST OP CRANIOTOMY SURGERY --TX'D AND RESOLVED--PT STATES NO FURTHER PROBLEM THAT SHE IS AWARE OF -SHE HAS NOT HAD TO SEE CARDIOLOGIST  . Pituitary mass ([place]) NOV 2013    MASS FOUND AFTER PT EXPERIENCED BILATERAL BLURRED / HAZY VISION -ESP LEFT EYE.  S/P CRANIOTOMY AND EYESIGHT RETURNED TO NORMAL--PT STILL HAS A LOT OF TENDERNESS RIGHT SIDE OF NOSE --THE  CRANIOTOMY WAS DONE BY TRANSNASAL APPROACH  . PONV (postoperative nausea and vomiting)     PT STATES PLEASE NOTE SHE HAS TENDERNESS RIGHT SIDE OF NOSE - SINCE TRANSNASAL CRANIOTOMY 04/2012  . Colon cancer ([place]) 1990s, 2014  . Vitamin D deficiency   . Prediabetes    Past Surgical History  Procedure Laterality Date  . Colon resection  1988  . Colonoscopy  09/20/2011    Procedure: COLONOSCOPY;  Surgeon: Missy Sabins, MD;  Location: [facility];  Service: Endoscopy;  Laterality: N/A;  . Hot hemostasis  09/20/2011    Procedure: HOT HEMOSTASIS (ARGON PLASMA COAGULATION/BICAP);  Surgeon: Missy Sabins, MD;  Location: [facility];  Service: Endoscopy;  Laterality: N/A;  . Cataract extraction  June/july 2013    bilateral eyes  . Colonoscopy  12/15/2011    Procedure: COLONOSCOPY;  Surgeon: Missy Sabins, MD;  Location: [place];  Service: Endoscopy;  Laterality: N/A;  . Colonoscopy  01/26/2012    Procedure: COLONOSCOPY;  Surgeon: Cleotis Nipper, MD;  Location: [facility];  Service: Endoscopy;  Laterality: N/A;  . Colon surgery    . Eye surgery      cataract bil  . Tumor removal      Left leg  . Back surgery    . Craniotomy  04/18/2012    Procedure: CRANIOTOMY HYPOPHYSECTOMY TRANSNASAL APPROACH;  Surgeon: Ophelia Charter, MD;  Location: [facility];  Service: Neurosurgery;  Laterality:  Bilateral;  Transphenoidal resection of Pituitary tumor.   . Transnasal approach  04/18/2012    Procedure: TRANSNASAL APPROACH;  Surgeon: Rozetta Nunnery, MD;  Location: [facility];  Service: ENT;  Laterality: Bilateral;  Transnasal approach  . Laparoscopic right hemi colectomy  06/27/2012    Procedure: LAPAROSCOPIC RIGHT HEMI COLECTOMY;  Surgeon: Pedro Earls, MD;  Location: [facility];  Service: General;  Laterality: N/A;  Laparoscopic Assited Right Hemicolectomy   Family History  Problem Relation Age of Onset  . Colon cancer Sister 77  . Heart disease Mother   . Heart disease Father   .  Cancer Maternal Aunt     unknown form of cancer dx in her 63s-50s  . Liver cancer Maternal Uncle     heavy ETOH user  . Breast cancer Sister 89  . Dementia Sister   . Colon cancer Cousin   . Colon polyps Son   . Colon polyps Son   . Colon cancer Other 30  . Colon polyps Other   . Brain cancer Cousin     dx in his 26s; paternal cousin   Social History  Substance Use Topics  . Smoking status: Never Smoker   . Smokeless tobacco: None  . Alcohol Use: No     Comment: a couple times a year, one drink at a time   OB History    No data available     Review of Systems  Constitutional: Negative for fever and fatigue.  Respiratory: Negative for shortness of breath.   Cardiovascular: Negative for chest pain, palpitations and leg swelling.  Gastrointestinal: Positive for abdominal pain.  Genitourinary: Negative for dysuria and flank pain.  All other systems reviewed and are negative.     Allergies  Aspirin; Codeine; Fenofibrate; Hydrochlorothiazide; and Metoclopramide  Home Medications   Prior to Admission medications   Medication Sig Start Date End Date Taking? Authorizing Provider  aspirin 81 MG tablet Take 81 mg by mouth daily.   Yes Historical Provider, MD  atorvastatin (LIPITOR) 80 MG tablet 1 tab daily or as directed for cholesterol 10/09/14  Yes Unk Pinto, MD  Cholecalciferol (VITAMIN D PO) Take 2,000 Units by mouth 3 (three) times daily.   Yes Historical Provider, MD  lisinopril (PRINIVIL,ZESTRIL) 10 MG tablet Take 1 tablet (10 mg total) by mouth daily. 09/22/15  Yes Vicie Mutters, PA-C  PARoxetine (PAXIL) 20 MG tablet Take 1 tablet (20 mg total) by mouth daily. 09/22/15  Yes Unk Pinto, MD  ranitidine (ZANTAC) 300 MG capsule Take 300 mg by mouth daily as needed for heartburn.    Yes Historical Provider, MD   BP 162/96 mmHg  Pulse 50  Temp(Src) 98.4 F (36.9 C) (Oral)  Resp 12  SpO2 100% Physical Exam  Constitutional: She is oriented to person, place, and  time. She appears well-developed and well-nourished.  Eyes: Conjunctivae are normal. Pupils are equal, round, and reactive to light.  Neck: No JVD present. No thyromegaly present.  Cardiovascular: Normal rate and regular rhythm.  Exam reveals no friction rub.   Pulmonary/Chest: Effort normal and breath sounds normal. She has no wheezes.  Abdominal: Soft. Bowel sounds are normal. There is tenderness (left lower quadrant and periumbilical area). There is no guarding.  Musculoskeletal: She exhibits no edema.  Neurological: She is alert and oriented to person, place, and time.  Skin: Skin is warm and dry. She is not diaphoretic.  Psychiatric: She has a normal mood and affect.  Vitals reviewed.   ED  Course  Procedures (including critical care time) Labs Review Labs Reviewed  COMPREHENSIVE METABOLIC PANEL - Abnormal; Notable for the following:    Sodium 146 (*)    Glucose, Bld 136 (*)    All other components within normal limits  URINALYSIS, ROUTINE W REFLEX MICROSCOPIC (NOT AT [facility]) - Abnormal; Notable for the following:    APPearance CLOUDY (*)    Hgb urine dipstick LARGE (*)    Protein, ur 30 (*)    Leukocytes, UA MODERATE (*)    All other components within normal limits  URINE MICROSCOPIC-ADD ON - Abnormal; Notable for the following:    Squamous Epithelial / LPF 0-5 (*)    Bacteria, UA RARE (*)    All other components within normal limits  LIPASE, BLOOD  CBC    Imaging Review Ct Abdomen Pelvis W Contrast  09/30/2015  CLINICAL DATA:  Acute generalized abdominal pain. EXAM: CT ABDOMEN AND PELVIS WITH CONTRAST TECHNIQUE: Multidetector CT imaging of the abdomen and pelvis was performed using the standard protocol following bolus administration of intravenous contrast. CONTRAST:  100 mL of Isovue-300 intravenously. COMPARISON:  None. FINDINGS: Multilevel degenerative disc disease is noted in the lumbar spine. Visualized lung bases are unremarkable. No gallstones are noted. Right hepatic  cyst is noted. The spleen and pancreas appear normal. Adrenal glands appear normal. Atherosclerosis of abdominal aorta is noted without aneurysm formation. Bilateral nephrolithiasis is noted. Severe left hydronephrosis is noted secondary to 13 x 4 mm calculus seen in the midportion of the left ureter. Moderate right hydronephrosis is noted secondary to 2 adjacent calculi in the proximal right ureter, the largest measuring 8 mm. Periureteral stranding is noted suggesting inflammation. Also noted is 4 mm calculus in distal right ureter. Urinary bladder appears normal. Fibroid uterus is noted. Ovaries are unremarkable. There is no evidence of bowel obstruction. Postsurgical changes are seen involving the right colon. No abnormal fluid collection is noted. IMPRESSION: Bilateral nephrolithiasis. Severe left hydronephrosis secondary to a 13 x 4 mm calculus in midportion of left ureter. Moderate right hydronephrosis is noted secondary to 2 adjacent proximal right ureteral calculi, with the largest measuring 8 mm. 4 mm distal right ureteral calculus is also noted. Fibroid uterus is noted. Electronically Signed   By: Marijo Conception, M.D.   On: 09/30/2015 21:40   I have personally reviewed and evaluated these images and lab results as part of my medical decision-making.   EKG Interpretation None      MDM   Final diagnoses:  Ureteral stone with hydronephrosis    Patient presents with abdominal pain. Differential diagnosis includes small bowel obstruction, intra-abdominal abscess, diverticulitis, recurrent malignancy. CT obtained as well as labs and urine studies. Patient has no leukocytosis, normal electrolytes, otherwise unremarkable labs. Urine with numerous red blood cells, but no leukocytes or nitrates concerning for infection. CT shows very large left-sided stone with severe hydronephrosis, as well as multiple right-sided stones. Consultation obtained from neurology who plans for admission and  stenting.    Leata Mouse, MD 10/01/15 [id number]  Tanna Furry, MD 10/01/15 [phone]

## 2015-09-30 NOTE — ED Notes (Signed)
Pt is aware urine is needed, pt is unable to urinate at this time. 

## 2015-09-30 NOTE — ED Notes (Signed)
Dr Davis at bedside.

## 2015-09-30 NOTE — ED Notes (Signed)
Pt is here with RLQ pain for one week and then 3 days ago started having NVD and states vomiting Pillay emesis.  CBG 166 per ems and bp 179/72, p-60

## 2015-10-01 ENCOUNTER — Encounter (HOSPITAL_COMMUNITY)
Admission: EM | Disposition: A | Discharge status: Home / Self Care | Payer: Self-pay | Source: Home / Self Care | Attending: Emergency Medicine

## 2015-10-01 ENCOUNTER — Encounter (HOSPITAL_COMMUNITY): Payer: Self-pay | Admitting: Certified Registered"

## 2015-10-01 ENCOUNTER — Observation Stay (HOSPITAL_COMMUNITY): Payer: Medicare Other | Admitting: Anesthesiology

## 2015-10-01 ENCOUNTER — Observation Stay (HOSPITAL_COMMUNITY): Payer: Medicare Other

## 2015-10-01 DIAGNOSIS — R1084 Generalized abdominal pain: Secondary | ICD-10-CM | POA: Diagnosis not present

## 2015-10-01 DIAGNOSIS — R112 Nausea with vomiting, unspecified: Secondary | ICD-10-CM | POA: Diagnosis not present

## 2015-10-01 DIAGNOSIS — K219 Gastro-esophageal reflux disease without esophagitis: Secondary | ICD-10-CM | POA: Diagnosis not present

## 2015-10-01 DIAGNOSIS — N201 Calculus of ureter: Secondary | ICD-10-CM | POA: Diagnosis not present

## 2015-10-01 DIAGNOSIS — N200 Calculus of kidney: Secondary | ICD-10-CM | POA: Diagnosis not present

## 2015-10-01 DIAGNOSIS — E785 Hyperlipidemia, unspecified: Secondary | ICD-10-CM | POA: Diagnosis not present

## 2015-10-01 DIAGNOSIS — I10 Essential (primary) hypertension: Secondary | ICD-10-CM | POA: Diagnosis not present

## 2015-10-01 DIAGNOSIS — N132 Hydronephrosis with renal and ureteral calculous obstruction: Secondary | ICD-10-CM | POA: Diagnosis not present

## 2015-10-01 HISTORY — PX: CYSTOSCOPY WITH URETEROSCOPY AND STENT PLACEMENT: SHX6377

## 2015-10-01 LAB — CBC
HCT: 35.5 % — ABNORMAL LOW (ref 36.0–46.0)
HEMOGLOBIN: 11.8 g/dL — AB (ref 12.0–15.0)
MCH: 29 pg (ref 26.0–34.0)
MCHC: 33.2 g/dL (ref 30.0–36.0)
MCV: 87.2 fL (ref 78.0–100.0)
Platelets: 203 10*3/uL (ref 150–400)
RBC: 4.07 MIL/uL (ref 3.87–5.11)
RDW: 13.1 % (ref 11.5–15.5)
WBC: 7 10*3/uL (ref 4.0–10.5)

## 2015-10-01 LAB — BASIC METABOLIC PANEL
ANION GAP: 9 (ref 5–15)
BUN: 13 mg/dL (ref 6–20)
CALCIUM: 8.8 mg/dL — AB (ref 8.9–10.3)
CHLORIDE: 108 mmol/L (ref 101–111)
CO2: 26 mmol/L (ref 22–32)
Creatinine, Ser: 0.73 mg/dL (ref 0.44–1.00)
GLUCOSE: 141 mg/dL — AB (ref 65–99)
POTASSIUM: 3.4 mmol/L — AB (ref 3.5–5.1)
Sodium: 143 mmol/L (ref 135–145)

## 2015-10-01 SURGERY — CYSTOURETEROSCOPY, WITH STENT INSERTION
Anesthesia: General | Laterality: Bilateral

## 2015-10-01 MED ORDER — FENTANYL CITRATE (PF) 100 MCG/2ML IJ SOLN: 25.0000 ug | INTRAMUSCULAR | Status: DC | PRN

## 2015-10-01 MED ORDER — DEXAMETHASONE SODIUM PHOSPHATE 10 MG/ML IJ SOLN
INTRAMUSCULAR | Status: DC | PRN
  Administered 2015-10-01: 10 mg via INTRAVENOUS

## 2015-10-01 MED ORDER — ATORVASTATIN CALCIUM 40 MG PO TABS
40.0000 mg | ORAL_TABLET | Freq: Every day | ORAL | Status: DC
  Filled 2015-10-01: qty 1

## 2015-10-01 MED ORDER — PROPOFOL 10 MG/ML IV BOLUS
INTRAVENOUS | Status: AC
  Filled 2015-10-01: qty 20

## 2015-10-01 MED ORDER — DIPHENHYDRAMINE HCL 12.5 MG/5ML PO ELIX: 12.5000 mg | ORAL_SOLUTION | Freq: Four times a day (QID) | ORAL | Status: DC | PRN

## 2015-10-01 MED ORDER — DEXTROSE-NACL 5-0.45 % IV SOLN
INTRAVENOUS | Status: DC
  Administered 2015-10-01: 02:00:00 via INTRAVENOUS

## 2015-10-01 MED ORDER — ONDANSETRON HCL 4 MG/2ML IJ SOLN: 4.0000 mg | INTRAMUSCULAR | Status: DC | PRN

## 2015-10-01 MED ORDER — LACTATED RINGERS IV SOLN
INTRAVENOUS | Status: DC | PRN
  Administered 2015-10-01: 04:00:00 via INTRAVENOUS

## 2015-10-01 MED ORDER — PAROXETINE HCL 20 MG PO TABS
20.0000 mg | ORAL_TABLET | Freq: Every day | ORAL | Status: DC
  Administered 2015-10-01: 20 mg via ORAL
  Filled 2015-10-01: qty 1

## 2015-10-01 MED ORDER — PROPOFOL 10 MG/ML IV BOLUS
INTRAVENOUS | Status: DC | PRN
  Administered 2015-10-01: 180 mg via INTRAVENOUS

## 2015-10-01 MED ORDER — SUCCINYLCHOLINE CHLORIDE 20 MG/ML IJ SOLN
INTRAMUSCULAR | Status: DC | PRN
  Administered 2015-10-01: 100 mg via INTRAVENOUS

## 2015-10-01 MED ORDER — DEXAMETHASONE SODIUM PHOSPHATE 10 MG/ML IJ SOLN
INTRAMUSCULAR | Status: AC
  Filled 2015-10-01: qty 1

## 2015-10-01 MED ORDER — ONDANSETRON HCL 4 MG/2ML IJ SOLN
INTRAMUSCULAR | Status: DC | PRN
  Administered 2015-10-01: 4 mg via INTRAVENOUS

## 2015-10-01 MED ORDER — LACTATED RINGERS IV SOLN: INTRAVENOUS | Status: DC

## 2015-10-01 MED ORDER — 0.9 % SODIUM CHLORIDE (POUR BTL) OPTIME
TOPICAL | Status: DC | PRN
  Administered 2015-10-01: 1000 mL

## 2015-10-01 MED ORDER — LISINOPRIL 10 MG PO TABS
10.0000 mg | ORAL_TABLET | Freq: Every day | ORAL | Status: DC
Start: 2015-10-01 — End: 2015-10-01
  Administered 2015-10-01: 10 mg via ORAL
  Filled 2015-10-01: qty 1

## 2015-10-01 MED ORDER — ONDANSETRON HCL 4 MG/2ML IJ SOLN
INTRAMUSCULAR | Status: AC
  Filled 2015-10-01: qty 2

## 2015-10-01 MED ORDER — HYDROMORPHONE HCL 1 MG/ML IJ SOLN: 0.5000 mg | INTRAMUSCULAR | Status: DC | PRN

## 2015-10-01 MED ORDER — HYDROCODONE-ACETAMINOPHEN 5-325 MG PO TABS: 1.0000 | ORAL_TABLET | Freq: Four times a day (QID) | ORAL | Status: DC | PRN

## 2015-10-01 MED ORDER — LIDOCAINE HCL (CARDIAC) 20 MG/ML IV SOLN
INTRAVENOUS | Status: AC
  Filled 2015-10-01: qty 5

## 2015-10-01 MED ORDER — FENTANYL CITRATE (PF) 100 MCG/2ML IJ SOLN
INTRAMUSCULAR | Status: AC
  Filled 2015-10-01: qty 2

## 2015-10-01 MED ORDER — CIPROFLOXACIN IN D5W 400 MG/200ML IV SOLN
400.0000 mg | Freq: Once | INTRAVENOUS | Status: AC
  Administered 2015-10-01: 400 mg via INTRAVENOUS

## 2015-10-01 MED ORDER — SODIUM CHLORIDE 0.9 % IR SOLN
Status: DC | PRN
  Administered 2015-10-01: 3000 mL

## 2015-10-01 MED ORDER — IOPAMIDOL (ISOVUE-300) INJECTION 61%
INTRAVENOUS | Status: AC
  Filled 2015-10-01: qty 50

## 2015-10-01 MED ORDER — DIPHENHYDRAMINE HCL 50 MG/ML IJ SOLN: 12.5000 mg | Freq: Four times a day (QID) | INTRAMUSCULAR | Status: DC | PRN

## 2015-10-01 MED ORDER — CIPROFLOXACIN IN D5W 400 MG/200ML IV SOLN
INTRAVENOUS | Status: AC
  Filled 2015-10-01: qty 200

## 2015-10-01 MED ORDER — ONDANSETRON HCL 4 MG/2ML IJ SOLN: 4.0000 mg | Freq: Once | INTRAMUSCULAR | Status: DC | PRN

## 2015-10-01 MED ORDER — FENTANYL CITRATE (PF) 100 MCG/2ML IJ SOLN
INTRAMUSCULAR | Status: DC | PRN
  Administered 2015-10-01 (×2): 50 ug via INTRAVENOUS

## 2015-10-01 MED ORDER — IOPAMIDOL (ISOVUE-300) INJECTION 61%
INTRAVENOUS | Status: DC | PRN
  Administered 2015-10-01: 10 mL

## 2015-10-01 MED ORDER — LIDOCAINE HCL (CARDIAC) 20 MG/ML IV SOLN
INTRAVENOUS | Status: DC | PRN
  Administered 2015-10-01: 50 mg via INTRAVENOUS

## 2015-10-01 SURGICAL SUPPLY — 18 items
BAG URO CATCHER STRL LF (MISCELLANEOUS) ×3 IMPLANT
BASKET ZERO TIP NITINOL 2.4FR (BASKET) IMPLANT
CATH INTERMIT  6FR 70CM (CATHETERS) ×3 IMPLANT
CLOTH BEACON ORANGE TIMEOUT ST (SAFETY) ×3 IMPLANT
FIBER LASER FLEXIVA 365 (UROLOGICAL SUPPLIES) IMPLANT
FIBER LASER TRAC TIP (UROLOGICAL SUPPLIES) IMPLANT
GLOVE BIOGEL M STRL SZ7.5 (GLOVE) ×3 IMPLANT
GOWN STRL REUS W/TWL LRG LVL3 (GOWN DISPOSABLE) ×6 IMPLANT
GUIDEWIRE ANG ZIPWIRE 038X150 (WIRE) IMPLANT
GUIDEWIRE STR DUAL SENSOR (WIRE) ×3 IMPLANT
IV NS 1000ML (IV SOLUTION)
IV NS 1000ML BAXH (IV SOLUTION) IMPLANT
MANIFOLD NEPTUNE II (INSTRUMENTS) ×3 IMPLANT
PACK CYSTO (CUSTOM PROCEDURE TRAY) ×3 IMPLANT
SHEATH ACCESS URETERAL 38CM (SHEATH) IMPLANT
STENT URET 6FRX24 CONTOUR (STENTS) ×6 IMPLANT
TUBING CONNECTING 10 (TUBING) ×2 IMPLANT
TUBING CONNECTING 10' (TUBING) ×1

## 2015-10-01 NOTE — Anesthesia Preprocedure Evaluation (Addendum)
Anesthesia Evaluation  Patient identified by MRN, date of birth, ID band Patient awake    Reviewed: Allergy & Precautions, NPO status , Patient's Chart, lab work & pertinent test results  History of Anesthesia Complications (+) PONV and history of anesthetic complications  Airway Mallampati: II  TM Distance: >3 FB Neck ROM: Full    Dental  (+) Teeth Intact, Dental Advisory Given, Caps,    Pulmonary neg pulmonary ROS,    Pulmonary exam normal breath sounds clear to auscultation       Cardiovascular hypertension (no longer on medications per patient), Pt. on medications Normal cardiovascular exam+ dysrhythmias Supra Ventricular Tachycardia  Rhythm:Regular Rate:Normal     Neuro/Psych  Headaches, PSYCHIATRIC DISORDERS Depression Pituitary tumor s/p resection, tender right nose per patient after surgery (transnasal approach) negative neurological ROS     GI/Hepatic Neg liver ROS, GERD  Medicated,Colon cancer s/p right hemi colectomy   Endo/Other  negative endocrine ROS  Renal/GU Bilateral stones with hydronephrosis     Musculoskeletal  (+) Arthritis , Osteoarthritis,    Abdominal   Peds  Hematology negative hematology ROS (+)   Anesthesia Other Findings Day of surgery medications reviewed with the patient.  Reproductive/Obstetrics                            Anesthesia Physical Anesthesia Plan  ASA: II and emergent  Anesthesia Plan: General   Post-op Pain Management:    Induction: Intravenous  Airway Management Planned: Oral ETT  Additional Equipment:   Intra-op Plan:   Post-operative Plan: Extubation in OR  Informed Consent: I have reviewed the patients History and Physical, chart, labs and discussed the procedure including the risks, benefits and alternatives for the proposed anesthesia with the patient or authorized representative who has indicated his/her understanding and  acceptance.   Dental advisory given  Plan Discussed with: CRNA  Anesthesia Plan Comments: (Risks/benefits of general anesthesia discussed with patient including risk of damage to teeth, lips, gum, and tongue, nausea/vomiting, allergic reactions to medications, and the possibility of heart attack, stroke and death.  All patient questions answered.  Patient wishes to proceed.)        Anesthesia Quick Evaluation

## 2015-10-01 NOTE — Transfer of Care (Signed)
Immediate Anesthesia Transfer of Care Note  Patient: Kristen [name]  Procedure(s) Performed: Procedure(s): CYSTOSCOPY, retrograde WITH URETEROSCOPY AND STENT PLACEMENT,  (Bilateral)  Patient Location: PACU  Anesthesia Type:General  Level of Consciousness: awake, alert  and oriented  Airway & Oxygen Therapy: Patient Spontanous Breathing and Patient connected to face mask oxygen  Post-op Assessment: Report given to RN and Post -op Vital signs reviewed and stable  Post vital signs: Reviewed and stable  Last Vitals:  Filed Vitals:   10/01/15 0123 10/01/15 0147  BP:  168/56  Pulse:  55  Temp: 36.4 C 36.4 C  Resp:  16    Last Pain:  Filed Vitals:   10/01/15 0150  PainSc: 0-No pain         Complications: No apparent anesthesia complications

## 2015-10-01 NOTE — Op Note (Signed)
Preoperative diagnosis:  1. Bilateral ureteral calculi   Postoperative diagnosis:  1. Bilateral ureteral calculi   Procedure:  1. Cystoscopy 2. Bilateral ureteral stent placement (6 x24) no string 3. Bilateral retrograde pyelography with interpretation  Surgeon: Pryor Curia. M.D.  Anesthesia: General  Complications: None  Intraoperative findings: Bilateral retrograde pyelograms were performed with omnipaque contrast via a 5 Fr ureteral catheter.  This revealed filling defects in the mid/proximal ureters bilaterally consistent with the patient known stones but without other abnormalities.  EBL: Minimal  Specimens: None  Indication: Kristen [name] is a 75 y.o. patient with bilateral ureteral calculi. After reviewing the management options for treatment, he elected to proceed with the above surgical procedure(s). We have discussed the potential benefits and risks of the procedure, side effects of the proposed treatment, the likelihood of the patient achieving the goals of the procedure, and any potential problems that might occur during the procedure or recuperation. Informed consent has been obtained.  Description of procedure:  The patient was taken to the operating room and general anesthesia was induced.  The patient was placed in the dorsal lithotomy position, prepped and draped in the usual sterile fashion, and preoperative antibiotics were administered. A preoperative time-out was performed.   Cystourethroscopy was performed.  The patient's urethra was examined and was normal. The bladder was then systematically examined in its entirety. There was no evidence for any bladder tumors, stones, or other mucosal pathology.    Attention then turned to the left ureteral orifice and a ureteral catheter was used to intubate the ureteral orifice.  Omnipaque contrast was injected through the ureteral catheter and a retrograde pyelogram was performed with findings as dictated  above.  A 0.38 sensor guidewire was then advanced up the left ureter into the renal pelvis under fluoroscopic guidance.  The wire was then backloaded through the cystoscope and a ureteral stent was advance over the wire using Seldinger technique.  The stent was positioned appropriately under fluoroscopic and cystoscopic guidance.  The wire was then removed with an adequate stent curl noted in the renal pelvis as well as in the bladder.  Attention then turned to the right ureteral orifice and a ureteral catheter was used to intubate the ureteral orifice.  Omnipaque contrast was injected through the ureteral catheter and a retrograde pyelogram was performed with findings as dictated above.  A 0.38 sensor guidewire was then advanced up the right ureter into the renal pelvis under fluoroscopic guidance.  The wire was then backloaded through the cystoscope and a ureteral stent was advance over the wire using Seldinger technique.  The stent was positioned appropriately under fluoroscopic and cystoscopic guidance.  The wire was then removed with an adequate stent curl noted in the renal pelvis as well as in the bladder.  The bladder was then emptied and the procedure ended.  The patient appeared to tolerate the procedure well and without complications.  The patient was able to be awakened and transferred to the recovery unit in satisfactory condition.    Pryor Curia MD

## 2015-10-01 NOTE — Care Management Note (Signed)
Case Management Note  Patient Details  Name: Kristen [name] MRN: [id number] Date of Birth: [birth year]  Subjective/Objective: 75 y/o f admitted w/ureteral stone with hydronephrosis. From home.                   Action/Plan:d/c home no needs or orders.   Expected Discharge Date:                  Expected Discharge Plan:  Home/Self Care  In-House Referral:     Discharge planning Services  CM Consult  Post Acute Care Choice:    Choice offered to:     DME Arranged:    DME Agency:     HH Arranged:    [place] Agency:     Status of Service:  Completed, signed off  Medicare Important Message Given:    Date Medicare IM Given:    Medicare IM give by:    Date Additional Medicare IM Given:    Additional Medicare Important Message give by:     If discussed at [place] of Stay Meetings, dates discussed:    Additional Comments:  Dessa Phi, RN 10/01/2015, 10:28 AM

## 2015-10-01 NOTE — H&P (Signed)
H&P  Chief Complaint: Bilateral ureteral calculi  History of Present Illness: Kristen [name] is a 75 y.o. year old who presented to the ED with complaints of worsening severe left abdominal pain.  She first noted pain about 1 1/2 weeks ago and this has been associated with nausea and vomiting.  She denies fever.  She has no history of kidney stones although her sister has had kidney stones.  She underwent a CT scan tonight which demonstrated large bilateral ureteral calculi.  Her renal function fortunately is normal.  Past Medical History  Diagnosis Date  . Shortness of breath     exertion  . Pneumonia     hx of pneumonia  . GERD (gastroesophageal reflux disease)   . Headache(784.0)     hx migraines  . Depression   . Colon polyps   . Arthritis   . Hypertension     pt states she has not taken any b/p medication since her brain surgery in oct 3013--took herself off the medication  . Dysrhythmia 04/2012    SVT - RATE 145 POST OP CRANIOTOMY SURGERY --TX'D AND RESOLVED--PT STATES NO FURTHER PROBLEM THAT SHE IS AWARE OF -SHE HAS NOT HAD TO SEE CARDIOLOGIST  . Pituitary mass ([place]) NOV 2013    MASS FOUND AFTER PT EXPERIENCED BILATERAL BLURRED / HAZY VISION -ESP LEFT EYE.  S/P CRANIOTOMY AND EYESIGHT RETURNED TO NORMAL--PT STILL HAS A LOT OF TENDERNESS RIGHT SIDE OF NOSE --THE CRANIOTOMY WAS DONE BY TRANSNASAL APPROACH  . PONV (postoperative nausea and vomiting)     PT STATES PLEASE NOTE SHE HAS TENDERNESS RIGHT SIDE OF NOSE - SINCE TRANSNASAL CRANIOTOMY 04/2012  . Colon cancer ([place]) 1990s, 2014  . Vitamin D deficiency   . Prediabetes     Past Surgical History  Procedure Laterality Date  . Colon resection  1988  . Colonoscopy  09/20/2011    Procedure: COLONOSCOPY;  Surgeon: Missy Sabins, MD;  Location: [facility];  Service: Endoscopy;  Laterality: N/A;  . Hot hemostasis  09/20/2011    Procedure: HOT HEMOSTASIS (ARGON PLASMA COAGULATION/BICAP);  Surgeon: Missy Sabins, MD;  Location: [facility];  Service: Endoscopy;  Laterality: N/A;  . Cataract extraction  June/july 2013    bilateral eyes  . Colonoscopy  12/15/2011    Procedure: COLONOSCOPY;  Surgeon: Missy Sabins, MD;  Location: [facility];  Service: Endoscopy;  Laterality: N/A;  . Colonoscopy  01/26/2012    Procedure: COLONOSCOPY;  Surgeon: Cleotis Nipper, MD;  Location: [facility];  Service: Endoscopy;  Laterality: N/A;  . Colon surgery    . Eye surgery      cataract bil  . Tumor removal      Left leg  . Back surgery    . Craniotomy  04/18/2012    Procedure: CRANIOTOMY HYPOPHYSECTOMY TRANSNASAL APPROACH;  Surgeon: Ophelia Charter, MD;  Location: [facility];  Service: Neurosurgery;  Laterality: Bilateral;  Transphenoidal resection of Pituitary tumor.   . Transnasal approach  04/18/2012    Procedure: TRANSNASAL APPROACH;  Surgeon: Rozetta Nunnery, MD;  Location: [facility];  Service: ENT;  Laterality: Bilateral;  Transnasal approach  . Laparoscopic right hemi colectomy  06/27/2012    Procedure: LAPAROSCOPIC RIGHT HEMI COLECTOMY;  Surgeon: Pedro Earls, MD;  Location: [facility];  Service: General;  Laterality: N/A;  Laparoscopic Assited Right Hemicolectomy    Home Medications:    Medication List    ASK your doctor about these medications  aspirin 81 MG tablet  Take 81 mg by mouth daily.     atorvastatin 80 MG tablet  Commonly known as:  LIPITOR  1 tab daily or as directed for cholesterol     lisinopril 10 MG tablet  Commonly known as:  PRINIVIL,ZESTRIL  Take 1 tablet (10 mg total) by mouth daily.     PARoxetine 20 MG tablet  Commonly known as:  PAXIL  Take 1 tablet (20 mg total) by mouth daily.     ranitidine 300 MG capsule  Commonly known as:  ZANTAC  Take 300 mg by mouth daily as needed for heartburn.     VITAMIN D PO  Take 2,000 Units by mouth 3 (three) times daily.        Allergies:  Allergies  Allergen Reactions  . Aspirin     High Doses  . Codeine Nausea And  Vomiting  . Fenofibrate Cough  . Hydrochlorothiazide     Fatigue  . Metoclopramide     Sedation    Family History  Problem Relation Age of Onset  . Colon cancer Sister 49  . Heart disease Mother   . Heart disease Father   . Cancer Maternal Aunt     unknown form of cancer dx in her 44s-50s  . Liver cancer Maternal Uncle     heavy ETOH user  . Breast cancer Sister 12  . Dementia Sister   . Colon cancer Cousin   . Colon polyps Son   . Colon polyps Son   . Colon cancer Other 30  . Colon polyps Other   . Brain cancer Cousin     dx in his 87s; paternal cousin    Social History:  reports that she has never smoked. She does not have any smokeless tobacco history on file. She reports that she does not drink alcohol or use illicit drugs.  ROS: A complete review of systems was performed.  All systems are negative except for pertinent findings as noted.  Physical Exam:  Vital signs in last 24 hours: Temp:  [97.6 F (36.4 C)-98.4 F (36.9 C)] 97.6 F (36.4 C) (04/27 0147) Pulse Rate:  [50-67] 55 (04/27 0147) Resp:  [5-20] 16 (04/27 0147) BP: (132-184)/(56-96) 168/56 mmHg (04/27 0147) SpO2:  [96 %-100 %] 100 % (04/27 0147) Weight:  [76 kg (167 lb 8.8 oz)] 76 kg (167 lb 8.8 oz) (04/27 0147) Constitutional:  Alert and oriented, No acute distress Cardiovascular: Regular rate and rhythm, No JVD Respiratory: Normal respiratory effort, Lungs clear bilaterally GI: Abdomen is soft, nondistended, no abdominal masses, moderate left CVAT and left LLQ tenderness GU: No CVA tenderness Lymphatic: No lymphadenopathy Neurologic: Grossly intact, no focal deficits Psychiatric: Normal mood and affect  Laboratory Data:   Recent Labs  09/30/15 1708  WBC 9.1  HGB 12.1  HCT 37.5  PLT 237     Recent Labs  09/30/15 1708  NA 146*  K 4.1  CL 110  GLUCOSE 136*  BUN 15  CALCIUM 10.1  CREATININE 0.75     Results for orders placed or performed during the hospital encounter of  09/30/15 (from the past 24 hour(s))  Lipase, blood     Status: None   Collection Time: 09/30/15  5:08 PM  Result Value Ref Range   Lipase 37 11 - 51 U/L  Comprehensive metabolic panel     Status: Abnormal   Collection Time: 09/30/15  5:08 PM  Result Value Ref Range   Sodium 146 (H) 135 -  145 mmol/L   Potassium 4.1 3.5 - 5.1 mmol/L   Chloride 110 101 - 111 mmol/L   CO2 27 22 - 32 mmol/L   Glucose, Bld 136 (H) 65 - 99 mg/dL   BUN 15 6 - 20 mg/dL   Creatinine, Ser 0.75 0.44 - 1.00 mg/dL   Calcium 10.1 8.9 - 10.3 mg/dL   Total Protein 6.7 6.5 - 8.1 g/dL   Albumin 3.8 3.5 - 5.0 g/dL   AST 18 15 - 41 U/L   ALT 18 14 - 54 U/L   Alkaline Phosphatase 73 38 - 126 U/L   Total Bilirubin 0.7 0.3 - 1.2 mg/dL   GFR calc non Af Amer >60 >60 mL/min   GFR calc Af Amer >60 >60 mL/min   Anion gap 9 5 - 15  CBC     Status: None   Collection Time: 09/30/15  5:08 PM  Result Value Ref Range   WBC 9.1 4.0 - 10.5 K/uL   RBC 4.32 3.87 - 5.11 MIL/uL   Hemoglobin 12.1 12.0 - 15.0 g/dL   HCT 37.5 36.0 - 46.0 %   MCV 86.8 78.0 - 100.0 fL   MCH 28.0 26.0 - 34.0 pg   MCHC 32.3 30.0 - 36.0 g/dL   RDW 12.9 11.5 - 15.5 %   Platelets 237 150 - 400 K/uL  Urinalysis, Routine w reflex microscopic (not at [facility])     Status: Abnormal   Collection Time: 09/30/15  9:35 PM  Result Value Ref Range   Color, Urine YELLOW YELLOW   APPearance CLOUDY (A) CLEAR   Specific Gravity, Urine 1.018 1.005 - 1.030   pH 5.5 5.0 - 8.0   Glucose, UA NEGATIVE NEGATIVE mg/dL   Hgb urine dipstick LARGE (A) NEGATIVE   Bilirubin Urine NEGATIVE NEGATIVE   Ketones, ur NEGATIVE NEGATIVE mg/dL   Protein, ur 30 (A) NEGATIVE mg/dL   Nitrite NEGATIVE NEGATIVE   Leukocytes, UA MODERATE (A) NEGATIVE  Urine microscopic-add on     Status: Abnormal   Collection Time: 09/30/15  9:35 PM  Result Value Ref Range   Squamous Epithelial / LPF 0-5 (A) NONE SEEN   WBC, UA 0-5 0 - 5 WBC/hpf   RBC / HPF TOO NUMEROUS TO COUNT 0 - 5 RBC/hpf   Bacteria,  UA RARE (A) NONE SEEN   No results found for this or any previous visit (from the past 240 hour(s)).  Renal Function:  Recent Labs  09/30/15 1708  CREATININE 0.75   Estimated Creatinine Clearance: 63.3 mL/min (by C-G formula based on Cr of 0.75).  Radiologic Imaging: Ct Abdomen Pelvis W Contrast  09/30/2015  CLINICAL DATA:  Acute generalized abdominal pain. EXAM: CT ABDOMEN AND PELVIS WITH CONTRAST TECHNIQUE: Multidetector CT imaging of the abdomen and pelvis was performed using the standard protocol following bolus administration of intravenous contrast. CONTRAST:  100 mL of Isovue-300 intravenously. COMPARISON:  None. FINDINGS: Multilevel degenerative disc disease is noted in the lumbar spine. Visualized lung bases are unremarkable. No gallstones are noted. Right hepatic cyst is noted. The spleen and pancreas appear normal. Adrenal glands appear normal. Atherosclerosis of abdominal aorta is noted without aneurysm formation. Bilateral nephrolithiasis is noted. Severe left hydronephrosis is noted secondary to 13 x 4 mm calculus seen in the midportion of the left ureter. Moderate right hydronephrosis is noted secondary to 2 adjacent calculi in the proximal right ureter, the largest measuring 8 mm. Periureteral stranding is noted suggesting inflammation. Also noted is 4 mm calculus in  distal right ureter. Urinary bladder appears normal. Fibroid uterus is noted. Ovaries are unremarkable. There is no evidence of bowel obstruction. Postsurgical changes are seen involving the right colon. No abnormal fluid collection is noted. IMPRESSION: Bilateral nephrolithiasis. Severe left hydronephrosis secondary to a 13 x 4 mm calculus in midportion of left ureter. Moderate right hydronephrosis is noted secondary to 2 adjacent proximal right ureteral calculi, with the largest measuring 8 mm. 4 mm distal right ureteral calculus is also noted. Fibroid uterus is noted. Electronically Signed   By: Marijo Conception, M.D.    On: 09/30/2015 21:40    Impression/Assessment:  Bilateral ureteral calculi  Plan:  She is at risk for developing acute renal failure.  I recommended proceeding with cystoscopy and bilateral ureteral stent placement to protect her renal function. I discussed the potential benefits and risks of the procedure, side effects of the proposed treatment, the likelihood of the patient achieving the goals of the procedure, and any potential problems that might occur during the procedure or recuperation.  She gives her informed consent. She understands that she will then need definitive stone treatment subsequently.  Jae Bruck,LES 10/01/2015, 2:20 AM  Pryor Curia MD

## 2015-10-01 NOTE — Care Management Obs Status (Signed)
[facility] NOTIFICATION   Patient Details  Name: Kristen [name] MRN: [id number] Date of Birth: 1941/05/15   Medicare Observation Status Notification Given:  Yes    Dessa Phi, RN 10/01/2015, 10:27 AM

## 2015-10-01 NOTE — Anesthesia Procedure Notes (Signed)
Procedure Name: Intubation Date/Time: 10/01/2015 4:02 AM Performed by: Noralyn Pick D Pre-anesthesia Checklist: Patient identified, Emergency Drugs available, Suction available and Patient being monitored Patient Re-evaluated:Patient Re-evaluated prior to inductionOxygen Delivery Method: Circle System Utilized Preoxygenation: Pre-oxygenation with 100% oxygen Intubation Type: IV induction, Rapid sequence and Cricoid Pressure applied Laryngoscope Size: Mac and 4 Grade View: Grade I Tube type: Oral Tube size: 7.5 mm Number of attempts: 1 Airway Equipment and Method: Stylet and Oral airway Placement Confirmation: ETT inserted through vocal cords under direct vision,  positive ETCO2 and breath sounds checked- equal and bilateral Secured at: 21 cm Tube secured with: Tape Dental Injury: Teeth and Oropharynx as per pre-operative assessment

## 2015-10-01 NOTE — Discharge Instructions (Signed)

## 2015-10-01 NOTE — Discharge Summary (Signed)
  Date of admission: 09/30/2015  Date of discharge: 10/01/2015  Admission diagnosis: Bilateral ureteral calculi  Discharge diagnosis: Bilateral ureteral calculi  Secondary diagnoses: Hypertension, hyperlipidemia, history of colon cancer  History and Physical: For full details, please see admission history and physical. Briefly, Kristen [name] is a 75 y.o. year old patient with bilateral ureteral calculi.   Hospital Course: She was admitted to the hospital due to pain control and concern about developing renal failure although her renal function fortunately was normal at admission.  She was taken to the OR and underwent cystoscopy and bilateral ureteral stent placement.  She tolerated this procedure well.  Her renal function remained normal and she was stable for discharge on 10/01/15.  Laboratory values:  Recent Labs  09/30/15 1708 10/01/15 0601  HGB 12.1 11.8*  HCT 37.5 35.5*    Recent Labs  09/30/15 1708 10/01/15 0601  CREATININE 0.75 0.73    Disposition: Home  Discharge instruction: The patient was instructed to be ambulatory and has no restrictions.  Discharge medications:    Medication List    STOP taking these medications        aspirin 81 MG tablet      TAKE these medications        atorvastatin 80 MG tablet  Commonly known as:  LIPITOR  1 tab daily or as directed for cholesterol     HYDROcodone-acetaminophen 5-325 MG tablet  Commonly known as:  NORCO/VICODIN  Take 1-2 tablets by mouth every 6 (six) hours as needed.     lisinopril 10 MG tablet  Commonly known as:  PRINIVIL,ZESTRIL  Take 1 tablet (10 mg total) by mouth daily.     PARoxetine 20 MG tablet  Commonly known as:  PAXIL  Take 1 tablet (20 mg total) by mouth daily.     ranitidine 300 MG capsule  Commonly known as:  ZANTAC  Take 300 mg by mouth daily as needed for heartburn.     VITAMIN D PO  Take 2,000 Units by mouth 3 (three) times daily.        Followup:      Follow-up  Information    Follow up with Dutch Gray, MD.   Specialty:  Urology   Why:  Will call to schedule surgery   Contact information:   [place] [address] [phone]

## 2015-10-02 ENCOUNTER — Other Ambulatory Visit: Payer: Self-pay | Admitting: Internal Medicine

## 2015-10-02 LAB — URINE CULTURE

## 2015-10-02 NOTE — Anesthesia Postprocedure Evaluation (Signed)
Anesthesia Post Note  Patient: Kristen [name]  Procedure(s) Performed: Procedure(s) (LRB): CYSTOSCOPY, retrograde WITH URETEROSCOPY AND STENT PLACEMENT,  (Bilateral)  Patient location during evaluation: PACU Anesthesia Type: General Level of consciousness: awake and alert Pain management: pain level controlled Vital Signs Assessment: post-procedure vital signs reviewed and stable Respiratory status: spontaneous breathing, nonlabored ventilation, respiratory function stable and patient connected to nasal cannula oxygen Cardiovascular status: blood pressure returned to baseline and stable Postop Assessment: no signs of nausea or vomiting Anesthetic complications: no    Last Vitals:  Filed Vitals:   10/01/15 0523 10/01/15 0526  BP: 163/62 163/62  Pulse: 56 55  Temp: 36.7 C 36.7 C  Resp:  18    Last Pain:  Filed Vitals:   10/01/15 0900  PainSc: 0-No pain                 Catalina Gravel

## 2015-10-05 ENCOUNTER — Encounter: Payer: Self-pay | Admitting: Internal Medicine

## 2015-10-05 ENCOUNTER — Other Ambulatory Visit: Payer: Self-pay | Admitting: Urology

## 2015-10-12 ENCOUNTER — Other Ambulatory Visit (HOSPITAL_COMMUNITY): Payer: Self-pay | Admitting: *Deleted

## 2015-10-12 NOTE — Progress Notes (Signed)
CBC AND BMET 10-01-15 EPIC, CAROTID DOPPLER 08-20-14 EPIC ECHO 08-19-14 EPIC

## 2015-10-12 NOTE — Patient Instructions (Signed)
Christie EANNA KULIG  10/12/2015   Your procedure is scheduled on: 10-19-15  Report to [facility] Main  Entrance take [facility]  elevators to 3rd floor to  [place] at 630 AM.  Call this number if you have problems the morning of surgery [phone]   Remember: ONLY 1 PERSON MAY GO WITH YOU TO SHORT STAY TO GET  READY MORNING OF [place].  Do not eat food or drink liquids :After Midnight.    Take these medicines the morning of surgery with A SIP OF WATER: ATORVASTATIN (LIPITOR), HYDROCODNE IF NEEDED PAROXETINE (PAXIL) DO NOT TAKE ANY DIABETIC MEDICATIONS DAY OF YOUR SURGERY                               You may not have any metal on your body including hair pins and              piercings  Do not wear jewelry, make-up, lotions, powders or perfumes, deodorant             Do not wear nail polish.  Do not shave  48 hours prior to surgery.              Men may shave face and neck.   Do not bring valuables to the hospital. [place].  Contacts, dentures or bridgework may not be worn into surgery.  Leave suitcase in the car. After surgery it may be brought to your room.     Patients discharged the day of surgery will not be allowed to drive home.  Name and phone number of your driver:  Special Instructions: N/A              Please read over the following fact sheets you were given: _____________________________________________________________________             [facility] - Preparing for Surgery Before surgery, you can play an important role.  Because skin is not sterile, your skin needs to be as free of germs as possible.  You can reduce the number of germs on your skin by washing with CHG (chlorahexidine gluconate) soap before surgery.  CHG is an antiseptic cleaner which kills germs and bonds with the skin to continue killing germs even after washing. Please DO NOT use if you have an allergy to CHG or  antibacterial soaps.  If your skin becomes reddened/irritated stop using the CHG and inform your nurse when you arrive at Short Stay. Do not shave (including legs and underarms) for at least 48 hours prior to the first CHG shower.  You may shave your face/neck. Please follow these instructions carefully:  1.  Shower with CHG Soap the night before surgery and the  morning of Surgery.  2.  If you choose to wash your hair, wash your hair first as usual with your  normal  shampoo.  3.  After you shampoo, rinse your hair and body thoroughly to remove the  shampoo.                           4.  Use CHG as you would any other liquid soap.  You can apply chg directly  to the  skin and wash                       Gently with a scrungie or clean washcloth.  5.  Apply the CHG Soap to your body ONLY FROM THE NECK DOWN.   Do not use on face/ open                           Wound or open sores. Avoid contact with eyes, ears mouth and genitals (private parts).                       Wash face,  Genitals (private parts) with your normal soap.             6.  Wash thoroughly, paying special attention to the area where your surgery  will be performed.  7.  Thoroughly rinse your body with warm water from the neck down.  8.  DO NOT shower/wash with your normal soap after using and rinsing off  the CHG Soap.                9.  Pat yourself dry with a clean towel.            10.  Wear clean pajamas.            11.  Place clean sheets on your bed the night of your first shower and do not  sleep with pets. Day of Surgery : Do not apply any lotions/deodorants the morning of surgery.  Please wear clean clothes to the hospital/surgery center.  FAILURE TO FOLLOW THESE INSTRUCTIONS MAY RESULT IN THE CANCELLATION OF YOUR SURGERY PATIENT SIGNATURE_________________________________  NURSE SIGNATURE__________________________________  ________________________________________________________________________

## 2015-10-13 ENCOUNTER — Inpatient Hospital Stay (HOSPITAL_COMMUNITY)
Admission: RE | Admit: 2015-10-13 | Discharge: 2015-10-13 | Disposition: A | Discharge status: Home / Self Care | Payer: Medicare Other | Source: Ambulatory Visit

## 2015-10-16 ENCOUNTER — Encounter (HOSPITAL_COMMUNITY)
Admission: RE | Admit: 2015-10-16 | Discharge: 2015-10-16 | Disposition: A | Discharge status: Home / Self Care | Payer: Medicare Other | Source: Ambulatory Visit | Attending: Urology | Admitting: Urology

## 2015-10-16 ENCOUNTER — Ambulatory Visit (HOSPITAL_COMMUNITY)
Admission: RE | Admit: 2015-10-16 | Discharge: 2015-10-16 | Disposition: A | Discharge status: Home / Self Care | Payer: Medicare Other | Source: Ambulatory Visit | Attending: Urology | Admitting: Urology

## 2015-10-16 ENCOUNTER — Other Ambulatory Visit: Payer: Self-pay

## 2015-10-16 ENCOUNTER — Encounter (HOSPITAL_COMMUNITY): Payer: Self-pay

## 2015-10-16 DIAGNOSIS — M5134 Other intervertebral disc degeneration, thoracic region: Secondary | ICD-10-CM | POA: Insufficient documentation

## 2015-10-16 DIAGNOSIS — Z01818 Encounter for other preprocedural examination: Secondary | ICD-10-CM | POA: Diagnosis not present

## 2015-10-16 NOTE — Patient Instructions (Signed)
Kristen [name]  10/16/2015   Your procedure is scheduled on: 10-19-15  Report to [facility] Main  Entrance take [facility]  elevators to 3rd floor to  [place] at 630 AM.  Call this number if you have problems the morning of surgery [phone]   Remember: ONLY 1 PERSON MAY GO WITH YOU TO SHORT STAY TO GET  READY MORNING OF [place].  Do not eat food or drink liquids :After Midnight.     Take these medicines the morning of surgery with A SIP OF WATER: atorvastatin (lipitor), hydrocodone if needed                                You may not have any metal on your body including hair pins and              piercings  Do not wear jewelry, make-up, lotions, powders or perfumes, deodorant             Do not wear nail polish.  Do not shave  48 hours prior to surgery.              Men may shave face and neck.   Do not bring valuables to the hospital. [place].  Contacts, dentures or bridgework may not be worn into surgery.  Leave suitcase in the car. After surgery it may be brought to your room.     Patients discharged the day of surgery will not be allowed to drive home.  Name and phone number of your driver:  Special Instructions: N/A              Please read over the following fact sheets you were given: _____________________________________________________________________             [facility] - Preparing for Surgery Before surgery, you can play an important role.  Because skin is not sterile, your skin needs to be as free of germs as possible.  You can reduce the number of germs on your skin by washing with CHG (chlorahexidine gluconate) soap before surgery.  CHG is an antiseptic cleaner which kills germs and bonds with the skin to continue killing germs even after washing. Please DO NOT use if you have an allergy to CHG or antibacterial soaps.  If your skin becomes reddened/irritated stop using  the CHG and inform your nurse when you arrive at Short Stay. Do not shave (including legs and underarms) for at least 48 hours prior to the first CHG shower.  You may shave your face/neck. Please follow these instructions carefully:  1.  Shower with CHG Soap the night before surgery and the  morning of Surgery.  2.  If you choose to wash your hair, wash your hair first as usual with your  normal  shampoo.  3.  After you shampoo, rinse your hair and body thoroughly to remove the  shampoo.                           4.  Use CHG as you would any other liquid soap.  You can apply chg directly  to the skin and wash  Gently with a scrungie or clean washcloth.  5.  Apply the CHG Soap to your body ONLY FROM THE NECK DOWN.   Do not use on face/ open                           Wound or open sores. Avoid contact with eyes, ears mouth and genitals (private parts).                       Wash face,  Genitals (private parts) with your normal soap.             6.  Wash thoroughly, paying special attention to the area where your surgery  will be performed.  7.  Thoroughly rinse your body with warm water from the neck down.  8.  DO NOT shower/wash with your normal soap after using and rinsing off  the CHG Soap.                9.  Pat yourself dry with a clean towel.            10.  Wear clean pajamas.            11.  Place clean sheets on your bed the night of your first shower and do not  sleep with pets. Day of Surgery : Do not apply any lotions/deodorants the morning of surgery.  Please wear clean clothes to the hospital/surgery center.  FAILURE TO FOLLOW THESE INSTRUCTIONS MAY RESULT IN THE CANCELLATION OF YOUR SURGERY PATIENT SIGNATURE_________________________________  NURSE SIGNATURE__________________________________  ________________________________________________________________________

## 2015-10-18 NOTE — Anesthesia Preprocedure Evaluation (Addendum)
Anesthesia Evaluation  Patient identified by MRN, date of birth, ID band Patient awake    Reviewed: Allergy & Precautions, NPO status , Patient's Chart, lab work & pertinent test results  History of Anesthesia Complications (+) PONV and history of anesthetic complications  Airway Mallampati: II  TM Distance: >3 FB Neck ROM: Full    Dental no notable dental hx. (+) Dental Advisory Given   Pulmonary neg pulmonary ROS,    Pulmonary exam normal breath sounds clear to auscultation       Cardiovascular hypertension, Normal cardiovascular exam Rhythm:Regular Rate:Normal  Echo 3/16: Left ventricle: The cavity size was normal. Wall thickness was increased in a pattern of mild LVH. Systolic function was vigorous. The estimated ejection fraction was in the range of 65% to 70%. Doppler parameters are consistent with abnormal left ventricular relaxation (grade 1 diastolic dysfunction). The E/e&' ratio is between 8-15, suggesting indeterminate LV filling pressure. - Mitral valve: Mildly thickened leaflets . There was trivial regurgitation. - Left atrium: The atrium was normal in size.  Impressions:  - Compared to the prior echo in 2013, there is now mild LVH and diastolic dysfunction. LVEF is 65-70%   Neuro/Psych  Headaches, PSYCHIATRIC DISORDERS Anxiety Depression    GI/Hepatic Neg liver ROS, GERD  Medicated and Controlled,  Endo/Other  negative endocrine ROS  Renal/GU Renal disease  negative genitourinary   Musculoskeletal  (+) Arthritis , Osteoarthritis,    Abdominal   Peds negative pediatric ROS (+)  Hematology negative hematology ROS (+)   Anesthesia Other Findings   Reproductive/Obstetrics negative OB ROS                           Anesthesia Physical Anesthesia Plan  ASA: II  Anesthesia Plan: General   Post-op Pain Management:    Induction: Intravenous  Airway  Management Planned: LMA  Additional Equipment:   Intra-op Plan:   Post-operative Plan: Extubation in OR  Informed Consent: I have reviewed the patients History and Physical, chart, labs and discussed the procedure including the risks, benefits and alternatives for the proposed anesthesia with the patient or authorized representative who has indicated his/her understanding and acceptance.   Dental advisory given  Plan Discussed with: CRNA  Anesthesia Plan Comments:         Anesthesia Quick Evaluation

## 2015-10-19 ENCOUNTER — Ambulatory Visit (HOSPITAL_COMMUNITY)
Admission: RE | Admit: 2015-10-19 | Discharge: 2015-10-19 | Disposition: A | Discharge status: Home / Self Care | Payer: Medicare Other | Source: Ambulatory Visit | Attending: Urology | Admitting: Urology

## 2015-10-19 ENCOUNTER — Ambulatory Visit (HOSPITAL_COMMUNITY): Payer: Medicare Other | Admitting: Anesthesiology

## 2015-10-19 ENCOUNTER — Encounter (HOSPITAL_COMMUNITY): Payer: Self-pay | Admitting: *Deleted

## 2015-10-19 ENCOUNTER — Encounter (HOSPITAL_COMMUNITY)
Admission: RE | Disposition: A | Discharge status: Home / Self Care | Payer: Self-pay | Source: Ambulatory Visit | Attending: Urology

## 2015-10-19 DIAGNOSIS — I340 Nonrheumatic mitral (valve) insufficiency: Secondary | ICD-10-CM | POA: Diagnosis not present

## 2015-10-19 DIAGNOSIS — M1990 Unspecified osteoarthritis, unspecified site: Secondary | ICD-10-CM | POA: Diagnosis not present

## 2015-10-19 DIAGNOSIS — Z79899 Other long term (current) drug therapy: Secondary | ICD-10-CM | POA: Insufficient documentation

## 2015-10-19 DIAGNOSIS — Z7982 Long term (current) use of aspirin: Secondary | ICD-10-CM | POA: Insufficient documentation

## 2015-10-19 DIAGNOSIS — N201 Calculus of ureter: Secondary | ICD-10-CM | POA: Diagnosis not present

## 2015-10-19 DIAGNOSIS — E559 Vitamin D deficiency, unspecified: Secondary | ICD-10-CM | POA: Insufficient documentation

## 2015-10-19 DIAGNOSIS — K219 Gastro-esophageal reflux disease without esophagitis: Secondary | ICD-10-CM | POA: Diagnosis not present

## 2015-10-19 DIAGNOSIS — F329 Major depressive disorder, single episode, unspecified: Secondary | ICD-10-CM | POA: Insufficient documentation

## 2015-10-19 DIAGNOSIS — N132 Hydronephrosis with renal and ureteral calculous obstruction: Secondary | ICD-10-CM | POA: Diagnosis not present

## 2015-10-19 DIAGNOSIS — I10 Essential (primary) hypertension: Secondary | ICD-10-CM | POA: Insufficient documentation

## 2015-10-19 HISTORY — PX: HOLMIUM LASER APPLICATION: SHX5852

## 2015-10-19 HISTORY — PX: CYSTOSCOPY WITH URETEROSCOPY AND STENT PLACEMENT: SHX6377

## 2015-10-19 SURGERY — CYSTOURETEROSCOPY, WITH STENT INSERTION
Anesthesia: General | Laterality: Right

## 2015-10-19 MED ORDER — CIPROFLOXACIN IN D5W 400 MG/200ML IV SOLN
400.0000 mg | INTRAVENOUS | Status: AC
  Administered 2015-10-19: 400 mg via INTRAVENOUS

## 2015-10-19 MED ORDER — DEXAMETHASONE SODIUM PHOSPHATE 10 MG/ML IJ SOLN
INTRAMUSCULAR | Status: AC
  Filled 2015-10-19: qty 1

## 2015-10-19 MED ORDER — DEXAMETHASONE SODIUM PHOSPHATE 10 MG/ML IJ SOLN
INTRAMUSCULAR | Status: DC | PRN
  Administered 2015-10-19: 10 mg via INTRAVENOUS

## 2015-10-19 MED ORDER — FENTANYL CITRATE (PF) 100 MCG/2ML IJ SOLN
INTRAMUSCULAR | Status: AC
  Filled 2015-10-19: qty 2

## 2015-10-19 MED ORDER — ONDANSETRON HCL 4 MG/2ML IJ SOLN
INTRAMUSCULAR | Status: AC
  Filled 2015-10-19: qty 2

## 2015-10-19 MED ORDER — ONDANSETRON HCL 4 MG/2ML IJ SOLN: 4.0000 mg | Freq: Once | INTRAMUSCULAR | Status: DC | PRN

## 2015-10-19 MED ORDER — LIDOCAINE HCL (CARDIAC) 20 MG/ML IV SOLN
INTRAVENOUS | Status: AC
  Filled 2015-10-19: qty 5

## 2015-10-19 MED ORDER — DIATRIZOATE MEGLUMINE 30 % UR SOLN
URETHRAL | Status: AC
Start: 2015-10-19 — End: 2015-10-19
  Filled 2015-10-19: qty 100

## 2015-10-19 MED ORDER — IOPAMIDOL (ISOVUE-300) INJECTION 61%
INTRAVENOUS | Status: DC | PRN
  Administered 2015-10-19: 10 mL

## 2015-10-19 MED ORDER — PROPOFOL 10 MG/ML IV BOLUS
INTRAVENOUS | Status: DC | PRN
  Administered 2015-10-19: 30 mg via INTRAVENOUS
  Administered 2015-10-19: 120 mg via INTRAVENOUS

## 2015-10-19 MED ORDER — CIPROFLOXACIN IN D5W 400 MG/200ML IV SOLN
INTRAVENOUS | Status: AC
  Filled 2015-10-19: qty 200

## 2015-10-19 MED ORDER — FENTANYL CITRATE (PF) 100 MCG/2ML IJ SOLN
INTRAMUSCULAR | Status: DC | PRN
  Administered 2015-10-19: 50 ug via INTRAVENOUS
  Administered 2015-10-19 (×2): 25 ug via INTRAVENOUS

## 2015-10-19 MED ORDER — EPHEDRINE SULFATE 50 MG/ML IJ SOLN
INTRAMUSCULAR | Status: DC | PRN
  Administered 2015-10-19: 10 mg via INTRAVENOUS
  Administered 2015-10-19: 5 mg via INTRAVENOUS

## 2015-10-19 MED ORDER — PROPOFOL 10 MG/ML IV BOLUS
INTRAVENOUS | Status: AC
  Filled 2015-10-19: qty 20

## 2015-10-19 MED ORDER — FENTANYL CITRATE (PF) 100 MCG/2ML IJ SOLN: 25.0000 ug | INTRAMUSCULAR | Status: DC | PRN

## 2015-10-19 MED ORDER — LACTATED RINGERS IV SOLN
INTRAVENOUS | Status: DC
  Administered 2015-10-19: 1000 mL via INTRAVENOUS

## 2015-10-19 MED ORDER — ONDANSETRON HCL 4 MG/2ML IJ SOLN
INTRAMUSCULAR | Status: DC | PRN
  Administered 2015-10-19: 4 mg via INTRAVENOUS

## 2015-10-19 MED ORDER — LIDOCAINE HCL (CARDIAC) 20 MG/ML IV SOLN
INTRAVENOUS | Status: DC | PRN
  Administered 2015-10-19: 40 mg via INTRAVENOUS

## 2015-10-19 SURGICAL SUPPLY — 17 items
BAG URO CATCHER STRL LF (MISCELLANEOUS) ×2 IMPLANT
BASKET ZERO TIP NITINOL 2.4FR (BASKET) ×2 IMPLANT
CATH INTERMIT  6FR 70CM (CATHETERS) ×2 IMPLANT
CLOTH BEACON ORANGE TIMEOUT ST (SAFETY) ×2 IMPLANT
FIBER LASER FLEXIVA 365 (UROLOGICAL SUPPLIES) ×2 IMPLANT
FIBER LASER TRAC TIP (UROLOGICAL SUPPLIES) IMPLANT
GLOVE BIOGEL M STRL SZ7.5 (GLOVE) ×2 IMPLANT
GOWN STRL REUS W/TWL LRG LVL3 (GOWN DISPOSABLE) ×4 IMPLANT
GUIDEWIRE ANG ZIPWIRE 038X150 (WIRE) IMPLANT
GUIDEWIRE STR DUAL SENSOR (WIRE) ×2 IMPLANT
IV NS 1000ML (IV SOLUTION) ×1
IV NS 1000ML BAXH (IV SOLUTION) ×1 IMPLANT
MANIFOLD NEPTUNE II (INSTRUMENTS) ×2 IMPLANT
PACK CYSTO (CUSTOM PROCEDURE TRAY) ×2 IMPLANT
SHEATH ACCESS URETERAL 38CM (SHEATH) ×2 IMPLANT
STENT URET 6FRX24 CONTOUR (STENTS) ×4 IMPLANT
TUBING CONNECTING 10 (TUBING) ×2 IMPLANT

## 2015-10-19 NOTE — Discharge Instructions (Addendum)
1. You may see some blood in the urine and may have some burning with urination for 48-72 hours. You also may notice that you have to urinate more frequently or urgently after your procedure which is normal.  °2. You should call should you develop an inability urinate, fever > 101, persistent nausea and vomiting that prevents you from eating or drinking to stay hydrated.  °3. If you have a stent, you will likely urinate more frequently and urgently until the stent is removed and you may experience some discomfort/pain in the lower abdomen and flank especially when urinating. You may take pain medication prescribed to you if needed for pain. You may also intermittently have blood in the urine until the stent is removed. ° ° ° ° ° ° °General Anesthesia, Adult, Care After °Refer to this sheet in the next few weeks. These instructions provide you with information on caring for yourself after your procedure. Your health care provider may also give you more specific instructions. Your treatment has been planned according to current medical practices, but problems sometimes occur. Call your health care provider if you have any problems or questions after your procedure. °WHAT TO EXPECT AFTER THE PROCEDURE °After the procedure, it is typical to experience: °· Sleepiness. °· Nausea and vomiting. °HOME CARE INSTRUCTIONS °· For the first 24 hours after general anesthesia: °¨ Have a responsible person with you. °¨ Do not drive a car. If you are alone, do not take public transportation. °¨ Do not drink alcohol. °¨ Do not take medicine that has not been prescribed by your health care provider. °¨ Do not sign important papers or make important decisions. °¨ You may resume a normal diet and activities as directed by your health care provider. °· Change bandages (dressings) as directed. °· If you have questions or problems that seem related to general anesthesia, call the hospital and ask for the anesthetist or anesthesiologist on  call. °SEEK MEDICAL CARE IF: °· You have nausea and vomiting that continue the day after anesthesia. °· You develop a rash. °SEEK IMMEDIATE MEDICAL CARE IF:  °· You have difficulty breathing. °· You have chest pain. °· You have any allergic problems. °  °This information is not intended to replace advice given to you by your health care provider. Make sure you discuss any questions you have with your health care provider. °  °Document Released: 08/29/2000 Document Revised: 06/13/2014 Document Reviewed: 09/21/2011 °Elsevier Interactive Patient Education ©2016 [employer] ° °

## 2015-10-19 NOTE — Anesthesia Postprocedure Evaluation (Signed)
Anesthesia Post Note  Patient: LEKITA DIBBLE  Procedure(s) Performed: Procedure(s) (LRB): CYSTOSCOPY,  RIGHT URETEROSCOPY, HOLMIUM LASER RIGHT URETERAL STONE, RIGHT URETERAL STENT PLACEMENT, LEFT URETEROSCOPY, INSERTION LEFT URETERAL STENT (Right) HOLMIUM LASER   (Right)  Patient location during evaluation: PACU Anesthesia Type: General Level of consciousness: awake and alert Pain management: pain level controlled Vital Signs Assessment: post-procedure vital signs reviewed and stable Respiratory status: spontaneous breathing, nonlabored ventilation, respiratory function stable and patient connected to nasal cannula oxygen Cardiovascular status: blood pressure returned to baseline and stable Postop Assessment: no signs of nausea or vomiting Anesthetic complications: no    Last Vitals:  Filed Vitals:   10/19/15 1012 10/19/15 1059  BP: 137/87 147/73  Pulse: 66 73  Temp: 36.7 C 36.4 C  Resp: 12 14    Last Pain:  Filed Vitals:   10/19/15 1101  PainSc: 2                  Justeen Hehr JENNETTE

## 2015-10-19 NOTE — Anesthesia Procedure Notes (Signed)
Procedure Name: LMA Insertion Date/Time: 10/19/2015 8:16 AM Performed by: Glory Buff Pre-anesthesia Checklist: Patient identified, Emergency Drugs available, Suction available and Patient being monitored Patient Re-evaluated:Patient Re-evaluated prior to inductionOxygen Delivery Method: Circle system utilized Preoxygenation: Pre-oxygenation with 100% oxygen Intubation Type: IV induction LMA: LMA inserted LMA Size: 4.0 Number of attempts: 1 Placement Confirmation: positive ETCO2 Tube secured with: Tape Dental Injury: Teeth and Oropharynx as per pre-operative assessment

## 2015-10-19 NOTE — H&P (View-Only) (Signed)
H&P  Chief Complaint: Bilateral ureteral calculi  History of Present Illness: Kristen [name] is a 75 y.o. year old who presented to the ED with complaints of worsening severe left abdominal pain.  She first noted pain about 1 1/2 weeks ago and this has been associated with nausea and vomiting.  She denies fever.  She has no history of kidney stones although her sister has had kidney stones.  She underwent a CT scan tonight which demonstrated large bilateral ureteral calculi.  Her renal function fortunately is normal.  Past Medical History  Diagnosis Date  . Shortness of breath     exertion  . Pneumonia     hx of pneumonia  . GERD (gastroesophageal reflux disease)   . Headache(784.0)     hx migraines  . Depression   . Colon polyps   . Arthritis   . Hypertension     pt states she has not taken any b/p medication since her brain surgery in oct 3013--took herself off the medication  . Dysrhythmia 04/2012    SVT - RATE 145 POST OP CRANIOTOMY SURGERY --TX'D AND RESOLVED--PT STATES NO FURTHER PROBLEM THAT SHE IS AWARE OF -SHE HAS NOT HAD TO SEE CARDIOLOGIST  . Pituitary mass ([place]) NOV 2013    MASS FOUND AFTER PT EXPERIENCED BILATERAL BLURRED / HAZY VISION -ESP LEFT EYE.  S/P CRANIOTOMY AND EYESIGHT RETURNED TO NORMAL--PT STILL HAS A LOT OF TENDERNESS RIGHT SIDE OF NOSE --THE CRANIOTOMY WAS DONE BY TRANSNASAL APPROACH  . PONV (postoperative nausea and vomiting)     PT STATES PLEASE NOTE SHE HAS TENDERNESS RIGHT SIDE OF NOSE - SINCE TRANSNASAL CRANIOTOMY 04/2012  . Colon cancer ([place]) 1990s, 2014  . Vitamin D deficiency   . Prediabetes     Past Surgical History  Procedure Laterality Date  . Colon resection  1988  . Colonoscopy  09/20/2011    Procedure: COLONOSCOPY;  Surgeon: Missy Sabins, MD;  Location: [facility];  Service: Endoscopy;  Laterality: N/A;  . Hot hemostasis  09/20/2011    Procedure: HOT HEMOSTASIS (ARGON PLASMA COAGULATION/BICAP);  Surgeon: Missy Sabins, MD;  Location: [facility];  Service: Endoscopy;  Laterality: N/A;  . Cataract extraction  June/july 2013    bilateral eyes  . Colonoscopy  12/15/2011    Procedure: COLONOSCOPY;  Surgeon: Missy Sabins, MD;  Location: [facility];  Service: Endoscopy;  Laterality: N/A;  . Colonoscopy  01/26/2012    Procedure: COLONOSCOPY;  Surgeon: Cleotis Nipper, MD;  Location: [facility];  Service: Endoscopy;  Laterality: N/A;  . Colon surgery    . Eye surgery      cataract bil  . Tumor removal      Left leg  . Back surgery    . Craniotomy  04/18/2012    Procedure: CRANIOTOMY HYPOPHYSECTOMY TRANSNASAL APPROACH;  Surgeon: Ophelia Charter, MD;  Location: [facility];  Service: Neurosurgery;  Laterality: Bilateral;  Transphenoidal resection of Pituitary tumor.   . Transnasal approach  04/18/2012    Procedure: TRANSNASAL APPROACH;  Surgeon: Rozetta Nunnery, MD;  Location: [facility];  Service: ENT;  Laterality: Bilateral;  Transnasal approach  . Laparoscopic right hemi colectomy  06/27/2012    Procedure: LAPAROSCOPIC RIGHT HEMI COLECTOMY;  Surgeon: Pedro Earls, MD;  Location: [facility];  Service: General;  Laterality: N/A;  Laparoscopic Assited Right Hemicolectomy    Home Medications:    Medication List    ASK your doctor about these medications  aspirin 81 MG tablet  Take 81 mg by mouth daily.     atorvastatin 80 MG tablet  Commonly known as:  LIPITOR  1 tab daily or as directed for cholesterol     lisinopril 10 MG tablet  Commonly known as:  PRINIVIL,ZESTRIL  Take 1 tablet (10 mg total) by mouth daily.     PARoxetine 20 MG tablet  Commonly known as:  PAXIL  Take 1 tablet (20 mg total) by mouth daily.     ranitidine 300 MG capsule  Commonly known as:  ZANTAC  Take 300 mg by mouth daily as needed for heartburn.     VITAMIN D PO  Take 2,000 Units by mouth 3 (three) times daily.        Allergies:  Allergies  Allergen Reactions  . Aspirin     High Doses  . Codeine Nausea And  Vomiting  . Fenofibrate Cough  . Hydrochlorothiazide     Fatigue  . Metoclopramide     Sedation    Family History  Problem Relation Age of Onset  . Colon cancer Sister 49  . Heart disease Mother   . Heart disease Father   . Cancer Maternal Aunt     unknown form of cancer dx in her 44s-50s  . Liver cancer Maternal Uncle     heavy ETOH user  . Breast cancer Sister 12  . Dementia Sister   . Colon cancer Cousin   . Colon polyps Son   . Colon polyps Son   . Colon cancer Other 30  . Colon polyps Other   . Brain cancer Cousin     dx in his 87s; paternal cousin    Social History:  reports that she has never smoked. She does not have any smokeless tobacco history on file. She reports that she does not drink alcohol or use illicit drugs.  ROS: A complete review of systems was performed.  All systems are negative except for pertinent findings as noted.  Physical Exam:  Vital signs in last 24 hours: Temp:  [97.6 F (36.4 C)-98.4 F (36.9 C)] 97.6 F (36.4 C) (04/27 0147) Pulse Rate:  [50-67] 55 (04/27 0147) Resp:  [5-20] 16 (04/27 0147) BP: (132-184)/(56-96) 168/56 mmHg (04/27 0147) SpO2:  [96 %-100 %] 100 % (04/27 0147) Weight:  [76 kg (167 lb 8.8 oz)] 76 kg (167 lb 8.8 oz) (04/27 0147) Constitutional:  Alert and oriented, No acute distress Cardiovascular: Regular rate and rhythm, No JVD Respiratory: Normal respiratory effort, Lungs clear bilaterally GI: Abdomen is soft, nondistended, no abdominal masses, moderate left CVAT and left LLQ tenderness GU: No CVA tenderness Lymphatic: No lymphadenopathy Neurologic: Grossly intact, no focal deficits Psychiatric: Normal mood and affect  Laboratory Data:   Recent Labs  09/30/15 1708  WBC 9.1  HGB 12.1  HCT 37.5  PLT 237     Recent Labs  09/30/15 1708  NA 146*  K 4.1  CL 110  GLUCOSE 136*  BUN 15  CALCIUM 10.1  CREATININE 0.75     Results for orders placed or performed during the hospital encounter of  09/30/15 (from the past 24 hour(s))  Lipase, blood     Status: None   Collection Time: 09/30/15  5:08 PM  Result Value Ref Range   Lipase 37 11 - 51 U/L  Comprehensive metabolic panel     Status: Abnormal   Collection Time: 09/30/15  5:08 PM  Result Value Ref Range   Sodium 146 (H) 135 -  145 mmol/L   Potassium 4.1 3.5 - 5.1 mmol/L   Chloride 110 101 - 111 mmol/L   CO2 27 22 - 32 mmol/L   Glucose, Bld 136 (H) 65 - 99 mg/dL   BUN 15 6 - 20 mg/dL   Creatinine, Ser 0.75 0.44 - 1.00 mg/dL   Calcium 10.1 8.9 - 10.3 mg/dL   Total Protein 6.7 6.5 - 8.1 g/dL   Albumin 3.8 3.5 - 5.0 g/dL   AST 18 15 - 41 U/L   ALT 18 14 - 54 U/L   Alkaline Phosphatase 73 38 - 126 U/L   Total Bilirubin 0.7 0.3 - 1.2 mg/dL   GFR calc non Af Amer >60 >60 mL/min   GFR calc Af Amer >60 >60 mL/min   Anion gap 9 5 - 15  CBC     Status: None   Collection Time: 09/30/15  5:08 PM  Result Value Ref Range   WBC 9.1 4.0 - 10.5 K/uL   RBC 4.32 3.87 - 5.11 MIL/uL   Hemoglobin 12.1 12.0 - 15.0 g/dL   HCT 37.5 36.0 - 46.0 %   MCV 86.8 78.0 - 100.0 fL   MCH 28.0 26.0 - 34.0 pg   MCHC 32.3 30.0 - 36.0 g/dL   RDW 12.9 11.5 - 15.5 %   Platelets 237 150 - 400 K/uL  Urinalysis, Routine w reflex microscopic (not at [facility])     Status: Abnormal   Collection Time: 09/30/15  9:35 PM  Result Value Ref Range   Color, Urine YELLOW YELLOW   APPearance CLOUDY (A) CLEAR   Specific Gravity, Urine 1.018 1.005 - 1.030   pH 5.5 5.0 - 8.0   Glucose, UA NEGATIVE NEGATIVE mg/dL   Hgb urine dipstick LARGE (A) NEGATIVE   Bilirubin Urine NEGATIVE NEGATIVE   Ketones, ur NEGATIVE NEGATIVE mg/dL   Protein, ur 30 (A) NEGATIVE mg/dL   Nitrite NEGATIVE NEGATIVE   Leukocytes, UA MODERATE (A) NEGATIVE  Urine microscopic-add on     Status: Abnormal   Collection Time: 09/30/15  9:35 PM  Result Value Ref Range   Squamous Epithelial / LPF 0-5 (A) NONE SEEN   WBC, UA 0-5 0 - 5 WBC/hpf   RBC / HPF TOO NUMEROUS TO COUNT 0 - 5 RBC/hpf   Bacteria,  UA RARE (A) NONE SEEN   No results found for this or any previous visit (from the past 240 hour(s)).  Renal Function:  Recent Labs  09/30/15 1708  CREATININE 0.75   Estimated Creatinine Clearance: 63.3 mL/min (by C-G formula based on Cr of 0.75).  Radiologic Imaging: Ct Abdomen Pelvis W Contrast  09/30/2015  CLINICAL DATA:  Acute generalized abdominal pain. EXAM: CT ABDOMEN AND PELVIS WITH CONTRAST TECHNIQUE: Multidetector CT imaging of the abdomen and pelvis was performed using the standard protocol following bolus administration of intravenous contrast. CONTRAST:  100 mL of Isovue-300 intravenously. COMPARISON:  None. FINDINGS: Multilevel degenerative disc disease is noted in the lumbar spine. Visualized lung bases are unremarkable. No gallstones are noted. Right hepatic cyst is noted. The spleen and pancreas appear normal. Adrenal glands appear normal. Atherosclerosis of abdominal aorta is noted without aneurysm formation. Bilateral nephrolithiasis is noted. Severe left hydronephrosis is noted secondary to 13 x 4 mm calculus seen in the midportion of the left ureter. Moderate right hydronephrosis is noted secondary to 2 adjacent calculi in the proximal right ureter, the largest measuring 8 mm. Periureteral stranding is noted suggesting inflammation. Also noted is 4 mm calculus in   distal right ureter. Urinary bladder appears normal. Fibroid uterus is noted. Ovaries are unremarkable. There is no evidence of bowel obstruction. Postsurgical changes are seen involving the right colon. No abnormal fluid collection is noted. IMPRESSION: Bilateral nephrolithiasis. Severe left hydronephrosis secondary to a 13 x 4 mm calculus in midportion of left ureter. Moderate right hydronephrosis is noted secondary to 2 adjacent proximal right ureteral calculi, with the largest measuring 8 mm. 4 mm distal right ureteral calculus is also noted. Fibroid uterus is noted. Electronically Signed   By: Marijo Conception, M.D.    On: 09/30/2015 21:40    Impression/Assessment:  Bilateral ureteral calculi  Plan:  She is at risk for developing acute renal failure.  I recommended proceeding with cystoscopy and bilateral ureteral stent placement to protect her renal function. I discussed the potential benefits and risks of the procedure, side effects of the proposed treatment, the likelihood of the patient achieving the goals of the procedure, and any potential problems that might occur during the procedure or recuperation.  She gives her informed consent. She understands that she will then need definitive stone treatment subsequently.  Breon Rehm,LES 10/01/2015, 2:20 AM  Pryor Curia MD

## 2015-10-19 NOTE — Op Note (Signed)
Preoperative diagnosis: Bilateral ureteral calculi  Postoperative diagnosis: Bilateral ureteral calculi  Procedure:  1. Cystoscopy 2. Bilateral ureteroscopy and stone removal 3. Ureteroscopic laser lithotripsy 4. Bilateral ureteral stent placement (6 x 24 - no string)   Surgeon: Roxy Horseman, Brooke Bonito. M.D.  Anesthesia: General  Complications: None  EBL: Minimal  Specimens: 1. Right ureteral calculi 2. Left ureteral calculus  Disposition of specimens: [facility] for stone analysis  Indication: Kristen [name] is a 75 y.o. year old patient with urolithiasis.  She initially presented with bilateral ureteral calculi and developing renal failure.  She underwent stent placement bilaterally and presents today for definitive management of her 3 right ureteral calculi and 1 left ureteral calculus. After reviewing the management options for treatment, the patient elected to proceed with the above surgical procedure(s). We have discussed the potential benefits and risks of the procedure, side effects of the proposed treatment, the likelihood of the patient achieving the goals of the procedure, and any potential problems that might occur during the procedure or recuperation. Informed consent has been obtained.  Description of procedure:  The patient was taken to the operating room and general anesthesia was induced.  The patient was placed in the dorsal lithotomy position, prepped and draped in the usual sterile fashion, and preoperative antibiotics were administered. A preoperative time-out was performed.   Cystourethroscopy was performed.  The patient's urethra was examined and was normal.  Her indwelling stents were identified. The bladder was then systematically examined in its entirety. There was no evidence for any bladder tumors, stones, or other mucosal pathology.    Attention then turned to the right ureteral orifice and a ureteral catheter was used to intubate the  ureteral orifice.    A 0.38 sensor guidewire was then advanced up the right ureter into the renal pelvis under fluoroscopic guidance. The 6 Fr semirigid ureteroscope was then advanced into the ureter next to the guidewire and the calculus was identified.  The distal small stone was identified and was removed with 0 tip nitinol basket.  I then looked back up and the two proximal stones were seen.  The more distal of these two stones was able to be removed intact with the 0 tip basket.  The larger stone migrated proximally into the renal collecting system.  I then switched to a flexible digital scope which allowed identification of the stone in the renal pelvis.  It was brought into the proximal ureter with the basket.   The stone was then fragmented with the 365 micron holmium laser fiber on a setting of 0.6 J and frequency of 6 Hz.   I then replaced the semirigid scope and removed all of the pieces of stone.  All stones were removed from the ureter with a zero tip nitinol basket.  Reinspection of the ureter revealed no remaining visible stones or fragments.   The wire was then backloaded through the cystoscope and a ureteral stent was advance over the wire using Seldinger technique.  The stent was positioned appropriately under fluoroscopic and cystoscopic guidance.  The wire was then removed with an adequate stent curl noted in the renal pelvis as well as in the bladder.  Attention then turned to the left ureteral orifice and a ureteral catheter was used to intubate the ureteral orifice.   A 0.38 sensor guidewire was then advanced up the left ureter into the renal pelvis under fluoroscopic guidance. The 6 Fr semirigid ureteroscope was then advanced into the ureter next to  the guidewire and the calculus was identified.  The stone was able to be removed intact from the ureter with a zero tip nitinol basket.  Reinspection of the ureter revealed no remaining visible stones or fragments.   The wire  was then backloaded through the cystoscope and a ureteral stent was advance over the wire using Seldinger technique.  The stent was positioned appropriately under fluoroscopic and cystoscopic guidance.  The wire was then removed with an adequate stent curl noted in the renal pelvis as well as in the bladder.  The bladder was then emptied and the procedure ended.  The patient appeared to tolerate the procedure well and without complications.  The patient was able to be awakened and transferred to the recovery unit in satisfactory condition.

## 2015-10-19 NOTE — Interval H&P Note (Signed)
History and Physical Interval Note:  10/19/2015 7:44 AM  Kristen [name]  has presented today for surgery, with the diagnosis of BILATERAL URETERAL CALCULI  The various methods of treatment have been discussed with the patient and family. After consideration of risks, benefits and other options for treatment, the patient has consented to  Procedure(s): CYSTOSCOPY RIGHT URETEROSCOPY STENT PLACEMENT (Right) HOLMIUM LASER   (Right) as a surgical intervention . The patient will also undergo left ureteroscopic treatment if appropriate. The patient's history has been reviewed, patient examined, no change in status, stable for surgery.  I have reviewed the patient's chart and labs.  Questions were answered to the patient's satisfaction.     Hilliary Jock,LES

## 2015-10-19 NOTE — Transfer of Care (Signed)
Immediate Anesthesia Transfer of Care Note  Patient: Kristen [name]  Procedure(s) Performed: Procedure(s): CYSTOSCOPY,  RIGHT URETEROSCOPY, HOLMIUM LASER RIGHT URETERAL STONE, RIGHT URETERAL STENT PLACEMENT, LEFT URETEROSCOPY, INSERTION LEFT URETERAL STENT (Right) HOLMIUM LASER   (Right)  Patient Location: PACU  Anesthesia Type:General  Level of Consciousness: awake, alert  and oriented  Airway & Oxygen Therapy: Patient Spontanous Breathing and Patient connected to face mask oxygen  Post-op Assessment: Report given to RN and Post -op Vital signs reviewed and stable  Post vital signs: Reviewed and stable  Last Vitals:  Filed Vitals:   10/19/15 0707  BP: 133/69  Pulse: 63  Temp: 36.9 C  Resp: 16    Last Pain:  Filed Vitals:   10/19/15 0759  PainSc: 0-No pain      Patients Stated Pain Goal: 3 ([id number])  Complications: No apparent anesthesia complications

## 2015-11-06 DIAGNOSIS — N201 Calculus of ureter: Secondary | ICD-10-CM | POA: Diagnosis not present

## 2015-12-01 ENCOUNTER — Encounter: Payer: Self-pay | Admitting: Internal Medicine

## 2015-12-01 ENCOUNTER — Ambulatory Visit (INDEPENDENT_AMBULATORY_CARE_PROVIDER_SITE_OTHER): Payer: Medicare Other | Admitting: Internal Medicine

## 2015-12-01 VITALS — BP 142/80 | HR 84 | Temp 97.9°F | Resp 16 | Ht 64.0 in | Wt 160.8 lb

## 2015-12-01 DIAGNOSIS — Z23 Encounter for immunization: Secondary | ICD-10-CM | POA: Diagnosis not present

## 2015-12-01 DIAGNOSIS — I10 Essential (primary) hypertension: Secondary | ICD-10-CM | POA: Diagnosis not present

## 2015-12-01 DIAGNOSIS — Z136 Encounter for screening for cardiovascular disorders: Secondary | ICD-10-CM | POA: Diagnosis not present

## 2015-12-01 DIAGNOSIS — K219 Gastro-esophageal reflux disease without esophagitis: Secondary | ICD-10-CM

## 2015-12-01 DIAGNOSIS — E559 Vitamin D deficiency, unspecified: Secondary | ICD-10-CM | POA: Diagnosis not present

## 2015-12-01 DIAGNOSIS — Z79899 Other long term (current) drug therapy: Secondary | ICD-10-CM | POA: Diagnosis not present

## 2015-12-01 DIAGNOSIS — E782 Mixed hyperlipidemia: Secondary | ICD-10-CM | POA: Diagnosis not present

## 2015-12-01 DIAGNOSIS — Z1212 Encounter for screening for malignant neoplasm of rectum: Secondary | ICD-10-CM

## 2015-12-01 DIAGNOSIS — R7303 Prediabetes: Secondary | ICD-10-CM | POA: Diagnosis not present

## 2015-12-01 LAB — CBC WITH DIFFERENTIAL/PLATELET
BASOS ABS: 0 {cells}/uL (ref 0–200)
Basophils Relative: 0 %
EOS ABS: 66 {cells}/uL (ref 15–500)
Eosinophils Relative: 1 %
HEMATOCRIT: 38.7 % (ref 35.0–45.0)
HEMOGLOBIN: 12.6 g/dL (ref 11.7–15.5)
LYMPHS ABS: 1914 {cells}/uL (ref 850–3900)
Lymphocytes Relative: 29 %
MCH: 28 pg (ref 27.0–33.0)
MCHC: 32.6 g/dL (ref 32.0–36.0)
MCV: 86 fL (ref 80.0–100.0)
MONO ABS: 396 {cells}/uL (ref 200–950)
MONOS PCT: 6 %
MPV: 10.4 fL (ref 7.5–12.5)
NEUTROS ABS: 4224 {cells}/uL (ref 1500–7800)
Neutrophils Relative %: 64 %
Platelets: 258 10*3/uL (ref 140–400)
RBC: 4.5 MIL/uL (ref 3.80–5.10)
RDW: 14.4 % (ref 11.0–15.0)
WBC: 6.6 10*3/uL (ref 3.8–10.8)

## 2015-12-01 LAB — HEMOGLOBIN A1C
Hgb A1c MFr Bld: 5.9 % — ABNORMAL HIGH
Mean Plasma Glucose: 123 mg/dL

## 2015-12-01 NOTE — Patient Instructions (Signed)
Recommend Adult Low Dose Aspirin or   coated  Aspirin 81 mg daily   To reduce risk of Colon Cancer 20 %,   Skin Cancer 26 % ,   Melanoma 46%   and   Pancreatic cancer 60%   ++++++++++++++++++++++++++++++++++++++++++++++++++++++ Vitamin D goal   is between 70-100.   Please make sure that you are taking your Vitamin D as directed.   It is very important as a natural anti-inflammatory   helping hair, skin, and nails, as well as reducing stroke and heart attack risk.   It helps your bones and helps with mood.  It also decreases numerous cancer risks so please take it as directed.   Low Vit D is associated with a 200-300% higher risk for CANCER   and 200-300% higher risk for HEART   ATTACK  &  STROKE.   ......................................  It is also associated with higher death rate at younger ages,   autoimmune diseases like Rheumatoid arthritis, Lupus, Multiple Sclerosis.     Also many other serious conditions, like depression, Alzheimer's  Dementia, infertility, muscle aches, fatigue, fibromyalgia - just to name a few.  ++++++++++++++++++++++++++++++++++++++++++++++++  Recommend the book "The END of DIETING" by Dr Joel Fuhrman   & the book "The END of DIABETES " by Dr Joel Fuhrman  At [employer] - get book & Audio CD's     Being diabetic has a  300% increased risk for heart attack, stroke, cancer, and alzheimer- type vascular dementia. It is very important that you work harder with diet by avoiding all foods that are white. Avoid white rice (Turano & wild rice is OK), white potatoes (sweetpotatoes in moderation is OK), White bread or wheat bread or anything made out of white flour like bagels, donuts, rolls, buns, biscuits, cakes, pastries, cookies, pizza crust, and pasta (made from white flour & egg whites) - vegetarian pasta or spinach or wheat pasta is OK. Multigrain breads like Arnold's or Pepperidge Farm, or multigrain sandwich thins or flatbreads.  Diet,  exercise and weight loss can reverse and cure diabetes in the early stages.  Diet, exercise and weight loss is very important in the control and prevention of complications of diabetes which affects every system in your body, ie. Brain - dementia/stroke, eyes - glaucoma/blindness, heart - heart attack/heart failure, kidneys - dialysis, stomach - gastric paralysis, intestines - malabsorption, nerves - severe painful neuritis, circulation - gangrene & loss of a leg(s), and finally cancer and Alzheimers.    I recommend avoid fried & greasy foods,  sweets/candy, white rice ([place] or wild rice or Quinoa is OK), white potatoes (sweet potatoes are OK) - anything made from white flour - bagels, doughnuts, rolls, buns, biscuits,white and wheat breads, pizza crust and traditional pasta made of white flour & egg white(vegetarian pasta or spinach or wheat pasta is OK).  Multi-grain bread is OK - like multi-grain flat bread or sandwich thins. Avoid alcohol in excess. Exercise is also important.    Eat all the vegetables you want - avoid meat, especially red meat and dairy - especially cheese.  Cheese is the most concentrated form of trans-fats which is the worst thing to clog up our arteries. Veggie cheese is OK which can be found in the fresh produce section at [employer] or [employer] or [employer]  ++++++++++++++++++++++++++++++++++++++++++++++++++ DASH Eating Plan  DASH stands for "Dietary Approaches to Stop Hypertension."   The DASH eating plan is a healthy eating plan that has been shown to reduce high blood   pressure (hypertension). Additional health benefits may include reducing the risk of type 2 diabetes mellitus, heart disease, and stroke. The DASH eating plan may also help with weight loss.  WHAT DO I NEED TO KNOW ABOUT THE DASH EATING PLAN?  For the DASH eating plan, you will follow these general guidelines:  Choose foods with a percent daily value for sodium of less than 5% (as listed on the food  label).  Use salt-free seasonings or herbs instead of table salt or sea salt.  Check with your health care provider or pharmacist before using salt substitutes.  Eat lower-sodium products, often labeled as "lower sodium" or "no salt added."  Eat fresh foods.  Eat more vegetables, fruits, and low-fat dairy products.    Choose whole grains. Look for the word "whole" as the first word in the ingredient list.  Choose fish   Limit sweets, desserts, sugars, and sugary drinks.  Choose heart-healthy fats.  Eat veggie cheese   Eat more home-cooked food and less restaurant, buffet, and fast food.  Limit fried foods.  [place] foods using methods other than frying.  Limit canned vegetables. If you do use them, rinse them well to decrease the sodium.  When eating at a restaurant, ask that your food be prepared with less salt, or no salt if possible.                      WHAT FOODS CAN I EAT?  Read Dr [name] Fuhrman's books on The End of Dieting & The End of Diabetes  Grains  Whole grain or whole wheat bread. [place] rice. Whole grain or whole wheat pasta. Quinoa, bulgur, and whole grain cereals. Low-sodium cereals. Corn or whole wheat flour tortillas. Whole grain cornbread. Whole grain crackers. Low-sodium crackers.  Vegetables  Fresh or frozen vegetables (raw, steamed, roasted, or grilled). Low-sodium or reduced-sodium tomato and vegetable juices. Low-sodium or reduced-sodium tomato sauce and paste. Low-sodium or reduced-sodium canned vegetables.   Fruits  All fresh, canned (in natural juice), or frozen fruits.  Protein Products   All fish and seafood.  Dried beans, peas, or lentils. Unsalted nuts and seeds. Unsalted canned beans.  Dairy  Low-fat dairy products, such as skim or 1% milk, 2% or reduced-fat cheeses, low-fat ricotta or cottage cheese, or plain low-fat yogurt. Low-sodium or reduced-sodium cheeses.  Fats and Oils  Tub margarines without trans fats. Light or  reduced-fat mayonnaise and salad dressings (reduced sodium). Avocado. Safflower, olive, or canola oils. Natural peanut or almond butter.  Other  Unsalted popcorn and pretzels. The items listed above may not be a complete list of recommended foods or beverages. Contact your dietitian for more options.  +++++++++++++++++++++++++++++++++++++++++++  WHAT FOODS ARE NOT RECOMMENDED?  Grains/ White flour or wheat flour  White bread. White pasta. White rice. Refined cornbread. Bagels and croissants. Crackers that contain trans fat.  Vegetables  Creamed or fried vegetables. Vegetables in a . Regular canned vegetables. Regular canned tomato sauce and paste. Regular tomato and vegetable juices.  Fruits  Dried fruits. Canned fruit in light or heavy syrup. Fruit juice.  Meat and Other Protein Products  Meat in general - RED mwaet & White meat.  Fatty cuts of meat. Ribs, chicken wings, bacon, sausage, bologna, salami, chitterlings, fatback, hot dogs, bratwurst, and packaged luncheon meats.  Dairy  Whole or 2% milk, cream, half-and-half, and cream cheese. Whole-fat or sweetened yogurt. Full-fat cheeses or blue cheese. Nondairy creamers and whipped toppings. Processed cheese, cheese spreads, or  cheese curds.  Condiments  Onion and garlic salt, seasoned salt, table salt, and sea salt. Canned and packaged gravies. Worcestershire sauce. Tartar sauce. Barbecue sauce. Teriyaki sauce. Soy sauce, including reduced sodium. Steak sauce. Fish sauce. Oyster sauce. Cocktail sauce. Horseradish. Ketchup and mustard. Meat flavorings and tenderizers. Bouillon cubes. Hot sauce. Tabasco sauce. Marinades. Taco seasonings. Relishes.  Fats and Oils Butter, stick margarine, lard, shortening and bacon fat. Coconut, palm kernel, or palm oils. Regular salad dressings.  Pickles and olives. Salted popcorn and pretzels.  The items listed above may not be a complete list of foods and beverages to avoid.   Preventive  Care for Adults  A healthy lifestyle and preventive care can promote health and wellness. Preventive health guidelines for women include the following key practices.  A routine yearly physical is a good way to check with your health care provider about your health and preventive screening. It is a chance to share any concerns and updates on your health and to receive a thorough exam.  Visit your dentist for a routine exam and preventive care every 6 months. Brush your teeth twice a day and floss once a day. Good oral hygiene prevents tooth decay and gum disease.  The frequency of eye exams is based on your age, health, family medical history, use of contact lenses, and other factors. Follow your health care provider's recommendations for frequency of eye exams.  Eat a healthy diet. Foods like vegetables, fruits, whole grains, low-fat dairy products, and lean protein foods contain the nutrients you need without too many calories. Decrease your intake of foods high in solid fats, added sugars, and salt. Eat the right amount of calories for you.Get information about a proper diet from your health care provider, if necessary.  Regular physical exercise is one of the most important things you can do for your health. Most adults should get at least 150 minutes of moderate-intensity exercise (any activity that increases your heart rate and causes you to sweat) each week. In addition, most adults need muscle-strengthening exercises on 2 or more days a week.  Maintain a healthy weight. The body mass index (BMI) is a screening tool to identify possible weight problems. It provides an estimate of body fat based on height and weight. Your health care provider can find your BMI and can help you achieve or maintain a healthy weight.For adults 20 years and older:  A BMI below 18.5 is considered underweight.  A BMI of 18.5 to 24.9 is normal.  A BMI of 25 to 29.9 is considered overweight.  A BMI of 30 and  above is considered obese.  Maintain normal blood lipids and cholesterol levels by exercising and minimizing your intake of saturated fat. Eat a balanced diet with plenty of fruit and vegetables. If your lipid or cholesterol levels are high, you are over 50, or you are at high risk for heart disease, you may need your cholesterol levels checked more frequently.Ongoing high lipid and cholesterol levels should be treated with medicines if diet and exercise are not working.  If you smoke, find out from your health care provider how to quit. If you do not use tobacco, do not start.  Lung cancer screening is recommended for adults aged 56-80 years who are at high risk for developing lung cancer because of a history of smoking. A yearly low-dose CT scan of the lungs is recommended for people who have at least a 30-pack-year history of smoking and are a current smoker or  have quit within the past 15 years. A pack year of smoking is smoking an average of 1 pack of cigarettes a day for 1 year (for example: 1 pack a day for 30 years or 2 packs a day for 15 years). Yearly screening should continue until the smoker has stopped smoking for at least 15 years. Yearly screening should be stopped for people who develop a health problem that would prevent them from having lung cancer treatment.  Avoid use of street drugs. Do not share needles with anyone. Ask for help if you need support or instructions about stopping the use of drugs.  High blood pressure causes heart disease and increases the risk of stroke.  Ongoing high blood pressure should be treated with medicines if weight loss and exercise do not work.  If you are 52-67 years old, ask your health care provider if you should take aspirin to prevent strokes.  Diabetes screening involves taking a blood sample to check your fasting blood sugar level. This should be done once every 3 years, after age 9, if you are within normal weight and without risk factors for  diabetes. Testing should be considered at a younger age or be carried out more frequently if you are overweight and have at least 1 risk factor for diabetes.  Breast cancer screening is essential preventive care for women. You should practice "breast self-awareness." This means understanding the normal appearance and feel of your breasts and may include breast self-examination. Any changes detected, no matter how small, should be reported to a health care provider. Women in their 45s and 30s should have a clinical breast exam (CBE) by a health care provider as part of a regular health exam every 1 to 3 years. After age 76, women should have a CBE every year. Starting at age 54, women should consider having a mammogram (breast X-ray test) every year. Women who have a family history of breast cancer should talk to their health care provider about genetic screening. Women at a high risk of breast cancer should talk to their health care providers about having an MRI and a mammogram every year.  Breast cancer gene (BRCA)-related cancer risk assessment is recommended for women who have family members with BRCA-related cancers. BRCA-related cancers include breast, ovarian, tubal, and peritoneal cancers. Having family members with these cancers may be associated with an increased risk for harmful changes (mutations) in the breast cancer genes BRCA1 and BRCA2. Results of the assessment will determine the need for genetic counseling and BRCA1 and BRCA2 testing.  Routine pelvic exams to screen for cancer are no longer recommended for nonpregnant women who are considered low risk for cancer of the pelvic organs (ovaries, uterus, and vagina) and who do not have symptoms. Ask your health care provider if a screening pelvic exam is right for you.  If you have had past treatment for cervical cancer or a condition that could lead to cancer, you need Pap tests and screening for cancer for at least 20 years after your  treatment. If Pap tests have been discontinued, your risk factors (such as having a new sexual partner) need to be reassessed to determine if screening should be resumed. Some women have medical problems that increase the chance of getting cervical cancer. In these cases, your health care provider may recommend more frequent screening and Pap tests.    Colorectal cancer can be detected and often prevented. Most routine colorectal cancer screening begins at the age of 73 years and  continues through age 75 years. However, your health care provider may recommend screening at an earlier age if you have risk factors for colon cancer. On a yearly basis, your health care provider may provide home test kits to check for hidden blood in the stool. Use of a small camera at the end of a tube, to directly examine the colon (sigmoidoscopy or colonoscopy), can detect the earliest forms of colorectal cancer. Talk to your health care provider about this at age 50, when routine screening begins. Direct exam of the colon should be repeated every 5-10 years through age 75 years, unless early forms of pre-cancerous polyps or small growths are found.  Osteoporosis is a disease in which the bones lose minerals and strength with aging. This can result in serious bone fractures or breaks. The risk of osteoporosis can be identified using a bone density scan. Women ages 65 years and over and women at risk for fractures or osteoporosis should discuss screening with their health care providers. Ask your health care provider whether you should take a calcium supplement or vitamin D to reduce the rate of osteoporosis.  Menopause can be associated with physical symptoms and risks. Hormone replacement therapy is available to decrease symptoms and risks. You should talk to your health care provider about whether hormone replacement therapy is right for you.  Use sunscreen. Apply sunscreen liberally and repeatedly throughout the day. You  should seek shade when your shadow is shorter than you. Protect yourself by wearing long sleeves, pants, a wide-brimmed hat, and sunglasses year round, whenever you are outdoors.  Once a month, do a whole body skin exam, using a mirror to look at the skin on your back. Tell your health care provider of new moles, moles that have irregular borders, moles that are larger than a pencil eraser, or moles that have changed in shape or color.  Stay current with required vaccines (immunizations).  Influenza vaccine. All adults should be immunized every year.  Tetanus, diphtheria, and acellular pertussis (Td, Tdap) vaccine. Pregnant women should receive 1 dose of Tdap vaccine during each pregnancy. The dose should be obtained regardless of the length of time since the last dose. Immunization is preferred during the 27th-36th week of gestation. An adult who has not previously received Tdap or who does not know her vaccine status should receive 1 dose of Tdap. This initial dose should be followed by tetanus and diphtheria toxoids (Td) booster doses every 10 years. Adults with an unknown or incomplete history of completing a 3-dose immunization series with Td-containing vaccines should begin or complete a primary immunization series including a Tdap dose. Adults should receive a Td booster every 10 years.    Zoster vaccine. One dose is recommended for adults aged 60 years or older unless certain conditions are present.    Pneumococcal 13-valent conjugate (PCV13) vaccine. When indicated, a person who is uncertain of her immunization history and has no record of immunization should receive the PCV13 vaccine. An adult aged 19 years or older who has certain medical conditions and has not been previously immunized should receive 1 dose of PCV13 vaccine. This PCV13 should be followed with a dose of pneumococcal polysaccharide (PPSV23) vaccine. The PPSV23 vaccine dose should be obtained at least 8 weeks after the dose  of PCV13 vaccine. An adult aged 19 years or older who has certain medical conditions and previously received 1 or more doses of PPSV23 vaccine should receive 1 dose of PCV13. The PCV13 vaccine dose should   be obtained 1 or more years after the last PPSV23 vaccine dose.    Pneumococcal polysaccharide (PPSV23) vaccine. When PCV13 is also indicated, PCV13 should be obtained first. All adults aged 65 years and older should be immunized. An adult younger than age 65 years who has certain medical conditions should be immunized. Any person who resides in a nursing home or long-term care facility should be immunized. An adult smoker should be immunized. People with an immunocompromised condition and certain other conditions should receive both PCV13 and PPSV23 vaccines. People with human immunodeficiency virus (HIV) infection should be immunized as soon as possible after diagnosis. Immunization during chemotherapy or radiation therapy should be avoided. Routine use of PPSV23 vaccine is not recommended for American Indians, [place] Natives, or people younger than 65 years unless there are medical conditions that require PPSV23 vaccine. When indicated, people who have unknown immunization and have no record of immunization should receive PPSV23 vaccine. One-time revaccination 5 years after the first dose of PPSV23 is recommended for people aged 19-64 years who have chronic kidney failure, nephrotic syndrome, asplenia, or immunocompromised conditions. People who received 1-2 doses of PPSV23 before age 65 years should receive another dose of PPSV23 vaccine at age 65 years or later if at least 5 years have passed since the previous dose. Doses of PPSV23 are not needed for people immunized with PPSV23 at or after age 65 years.   Preventive Services / Frequency  Ages 65 years and over  Blood pressure check.  Lipid and cholesterol check.  Lung cancer screening. / Every year if you are aged 55-80 years and have a  30-pack-year history of smoking and currently smoke or have quit within the past 15 years. Yearly screening is stopped once you have quit smoking for at least 15 years or develop a health problem that would prevent you from having lung cancer treatment.  Clinical breast exam.** / Every year after age 40 years.  BRCA-related cancer risk assessment.** / For women who have family members with a BRCA-related cancer (breast, ovarian, tubal, or peritoneal cancers).  Mammogram.** / Every year beginning at age 40 years and continuing for as long as you are in good health. Consult with your health care provider.  Pap test.** / Every 3 years starting at age 30 years through age 65 or 70 years with 3 consecutive normal Pap tests. Testing can be stopped between 65 and 70 years with 3 consecutive normal Pap tests and no abnormal Pap or HPV tests in the past 10 years.  Fecal occult blood test (FOBT) of stool. / Every year beginning at age 50 years and continuing until age 75 years. You may not need to do this test if you get a colonoscopy every 10 years.  Flexible sigmoidoscopy or colonoscopy.** / Every 5 years for a flexible sigmoidoscopy or every 10 years for a colonoscopy beginning at age 50 years and continuing until age 75 years.  Hepatitis C blood test.** / For all people born from 1945 through 1965 and any individual with known risks for hepatitis C.  Osteoporosis screening.** / A one-time screening for women ages 65 years and over and women at risk for fractures or osteoporosis.  Skin self-exam. / Monthly.  Influenza vaccine. / Every year.  Tetanus, diphtheria, and acellular pertussis (Tdap/Td) vaccine.** / 1 dose of Td every 10 years.  Zoster vaccine.** / 1 dose for adults aged 60 years or older.  Pneumococcal 13-valent conjugate (PCV13) vaccine.** / Consult your health care provider.    Pneumococcal polysaccharide (PPSV23) vaccine.** / 1 dose for all adults aged 65 years and older. Screening  for abdominal aortic aneurysm (AAA)  by ultrasound is recommended for people who have history of high blood pressure or who are current or former smokers. 

## 2015-12-01 NOTE — Progress Notes (Signed)
Patient ID: Kristen [name], female   DOB: [birth year], 75 y.o.   MRN: [id number]  [facility]                   Unk Pinto, M.D.    Uvaldo Bristle. Silverio Lay, P.A.-C      Starlyn Skeans, P.A.-C   [facility]                [address] [place], [place] [id number] Telephone [phone] Telefax [phone]  ______________________________________________________________________  Comprehensive Evaluation &  Examination     This very nice 75 y.o. [facility]  presents for a comprehensive evaluation and management of multiple medical co-morbidities.  Patient has been followed for HTN, Prediabetes, Hyperlipidemia and Vitamin D Deficiency. Other significant problems include resection of a pituitary tumor in 2013. Patient also has GERD controlled with OTC ranitidine.       HTN predates since 1999. Patient's BP has been controlled at home and patient denies any cardiac symptoms as chest pain, palpitations, shortness of breath, dizziness or ankle swelling. She had a negative Cardiolite in 2011. Today's BP is 142/80.      Patient's hyperlipidemia is controlled with diet and medications. Patient denies myalgias or other medication SE's. Last lipids were at goal with  Cholesterol 129; HDL 42*; LDL 64; Triglycerides 115 on 01/21/2015.     Patient has prediabetes predating since 2011 with A1c 5.9% and elevated Insulin 90 and then A1c 6.1% in 2012 and patient denies reactive hypoglycemic symptoms, visual blurring, diabetic polys, or paresthesias. Last A1c was  Still 6.2% on 01/21/2015.      Finally, patient has history of Vitamin D Deficiency  Of "75" in 2008 and last Vitamin D was  73 on 01/21/2015.    Medication Sig    EC bASA 81 mg  1 Tab daily   . atorvastatin  80 MG 1 tab daily or as directed for cholesterol (Patient taking differently: Take 40 mg by mouth daily. )  . Vit D  1000 units  Take 1,500 Units by mouth daily.  Marland Kitchen  lisinopril  10 MG Take 1 tablet (10 mg total) by mouth daily.  Marland Kitchen PARoxetine20 MG  Take 1 tablet (20 mg total) by mouth daily. (Patient taking differently: Take 20 mg by mouth at bedtime. )   Allergies  Allergen Reactions  . Aspirin Other (See Comments)    High Doses stomach trouble  . Codeine Nausea And Vomiting  . Fenofibrate Cough  . Hydrochlorothiazide Other (See Comments)    Fatigue  . Metoclopramide Other (See Comments)    Sedation   Past Medical History  Diagnosis Date  . GERD (gastroesophageal reflux disease)   . Headache(784.0)     hx migraines  . Depression   . Colon polyps   . Arthritis   . Hypertension     pt states she has not taken any b/p medication since her brain surgery in oct 3013--took herself off the medication  . Dysrhythmia 04/2012    SVT - RATE 145 POST OP CRANIOTOMY SURGERY --TX'D AND RESOLVED--PT STATES NO FURTHER PROBLEM THAT SHE IS AWARE OF -SHE HAS NOT HAD TO SEE CARDIOLOGIST  . Pituitary mass ([place]) NOV 2013    MASS FOUND AFTER PT EXPERIENCED BILATERAL BLURRED / HAZY VISION -ESP LEFT EYE.  S/P CRANIOTOMY AND EYESIGHT RETURNED TO NORMAL--PT STILL HAS A LOT OF  TENDERNESS RIGHT SIDE OF NOSE --THE CRANIOTOMY WAS DONE BY TRANSNASAL APPROACH  . Colon cancer ([place]) 1990s, 2014  . Vitamin D deficiency   . Prediabetes   . Pneumonia yrs ago    hx of pneumonia  . PONV (postoperative nausea and vomiting)     PT STATES PLEASE NOTE SHE HAS TENDERNESS RIGHT SIDE OF NOSE - SINCE TRANSNASAL CRANIOTOMY 04/2012   Health Maintenance  Topic Date Due  . ZOSTAVAX  06/04/2000  . DEXA SCAN  06/04/2005  . TETANUS/TDAP  07/21/2015  . INFLUENZA VACCINE  01/05/2016  . COLONOSCOPY  08/30/2023  . PNA vac Low Risk Adult  Completed   Immunization History  Administered Date(s) Administered  . Influenza Split 04/19/2012  . Influenza, High Dose Seasonal PF 04/17/2013, 03/12/2014  . Pneumococcal Conjugate-13 09/30/2014  . Pneumococcal Polysaccharide-23 04/19/2012  . Td  07/20/2005   Past Surgical History  Procedure Laterality Date  . Colon resection  1988  . Colonoscopy  09/20/2011    Procedure: COLONOSCOPY;  Surgeon: Missy Sabins, MD  . Hot hemostasis  09/20/2011    Procedure: HOT HEMOSTASIS (ARGON PLASMA COAGULATION/BICAP);  Surgeon: Missy Sabins, MD  . Cataract extraction  June/july 2013    bilateral eyes  . Colonoscopy  12/15/2011    Procedure: COLONOSCOPY;  Surgeon: Missy Sabins, MD  . Colonoscopy  01/26/2012    Procedure: COLONOSCOPY;  Surgeon: Cleotis Nipper, MD  . Eye surgery      cataract bil  . Tumor removal      Left leg  . Craniotomy  04/18/2012      Bilateral;  Transphenoidal resection of Pituitary tumor  - Ophelia Charter, M  . Transnasal approach  04/18/2012    Procedure: TRANSNASAL APPROACH;   Rozetta Nunnery, MD  . Laparoscopic right hemi colectomy  06/27/2012    Procedure: LAPAROSCOPIC RIGHT HEMI COLECTOMY;Matthew B Martin, MD  . Cystoscopy with ureteroscopy and stent placement Bilateral 10/01/2015    Procedure: CYSTOSCOPY, retrograde WITH URETEROSCOPY AND STENT PLACEMENT, ;  Surgeon: Raynelle Bring, MD  . Back surgery      lower  . Colon surgery  1990's  . Cystoscopy with ureteroscopy and stent placement Right 10/19/2015    Procedure: CYSTOSCOPY,  RIGHT URETEROSCOPY, HOLMIUM LASER RIGHT URETERAL STONE, RIGHT URETERAL STENT PLACEMENT, LEFT URETEROSCOPY, INSERTION LEFT URETERAL STENT;    Raynelle Bring, MD   Family History  Problem Relation Age of Onset  . Colon cancer Sister 61  . Heart disease Mother   . Heart disease Father   . Cancer Maternal Aunt     unknown form of cancer dx in her 24s-50s  . Liver cancer Maternal Uncle     heavy ETOH user  . Breast cancer Sister 62  . Dementia Sister   . Colon cancer Cousin   . Colon polyps Son   . Colon polyps Son   . Colon cancer Other 30  . Colon polyps Other   . Brain cancer Cousin     dx in his 26s; paternal cousin   Social History  Substance Use Topics  . Smoking  status: Never Smoker   . Smokeless tobacco: Never Used  . Alcohol Use: No     Comment: a couple times a year, one drink at a time    ROS Constitutional: Denies fever, chills, weight loss/gain, headaches, insomnia,  night sweats, and change in appetite. Does c/o fatigue. Eyes: Denies redness, blurred vision, diplopia, discharge, itchy, watery eyes.  ENT: Denies discharge,  congestion, post nasal drip, epistaxis, sore throat, earache, hearing loss, dental pain, Tinnitus, Vertigo, Sinus pain, snoring.  Cardio: Denies chest pain, palpitations, irregular heartbeat, syncope, dyspnea, diaphoresis, orthopnea, PND, claudication, edema Respiratory: denies cough, dyspnea, DOE, pleurisy, hoarseness, laryngitis, wheezing.  Gastrointestinal: Denies dysphagia, heartburn, reflux, water brash, pain, cramps, nausea, vomiting, bloating, diarrhea, constipation, hematemesis, melena, hematochezia, jaundice, hemorrhoids Genitourinary: Denies dysuria, frequency, urgency, nocturia, hesitancy, discharge, hematuria, flank pain Breast: Breast lumps, nipple discharge, bleeding.  Musculoskeletal: Denies arthralgia, myalgia, stiffness, Jt. Swelling, pain, limp, and strain/sprain. Denies falls. Skin: Denies puritis, rash, hives, warts, acne, eczema, changing in skin lesion Neuro: No weakness, tremor, incoordination, spasms, paresthesia, pain Psychiatric: Denies confusion, memory loss, sensory loss. Denies Depression. Endocrine: Denies change in weight, skin, hair change, nocturia, and paresthesia, diabetic polys, visual blurring, hyper / hypo glycemic episodes.  Heme/Lymph: No excessive bleeding, bruising, enlarged lymph nodes.  Physical Exam  BP 142/80 mmHg  Pulse 84  Temp(Src) 97.9 F (36.6 C)  Resp 16  Ht 5\' 4"  (1.626 m)  Wt 160 lb 12.8 oz (72.938 kg)  BMI 27.59 kg/m2  General Appearance: Well nourished and in no apparent distress. Eyes: PERRLA, EOMs, conjunctiva no swelling or erythema, normal fundi and  vessels. Sinuses: No frontal/maxillary tenderness ENT/Mouth: EACs patent / TMs  nl. Nares clear without erythema, swelling, mucoid exudates. Oral hygiene is good. No erythema, swelling, or exudate. Tongue normal, non-obstructing. Tonsils not swollen or erythematous. Hearing normal.  Neck: Supple, thyroid normal. No bruits, nodes or JVD. Respiratory: Respiratory effort normal.  BS equal and clear bilateral without rales, rhonci, wheezing or stridor. Cardio: Heart sounds are normal with regular rate and rhythm and no murmurs, rubs or gallops. Peripheral pulses are normal and equal bilaterally without edema. No aortic or femoral bruits. Chest: symmetric with normal excursions and percussion. Breasts: Symmetric, without lumps, nipple discharge, retractions, or fibrocystic changes.  Abdomen: Flat, soft with bowel sounds active. Nontender, no guarding, rebound, hernias, masses, or organomegaly.  Lymphatics: Non tender without lymphadenopathy.  Genitourinary:  Musculoskeletal: Full ROM all peripheral extremities, joint stability, 5/5 strength, and normal gait. Skin: Warm and dry without rashes, lesions, cyanosis, clubbing or  ecchymosis.  Neuro: Cranial nerves intact, reflexes equal bilaterally. Normal muscle tone, no cerebellar symptoms. Sensation intact.  Pysch: Alert and oriented X 3, normal affect, Insight and Judgment appropriate.   Assessment and Plan   1. Hypertension  - Microalbumin / creatinine urine ratio - EKG 12-Lead - [place], RETROPERITNL ABD,  LTD - TSH  2. Hyperlipidemia  - Lipid panel - TSH  3. Prediabetes  - Hemoglobin A1c - Insulin, random  4. Vitamin D deficiency  - VITAMIN D 25 Hydroxy   5. Gastroesophageal reflux disease   6. Screening for rectal cancer  - POC Hemoccult Bld/Stl   7. Medication management  - Urinalysis, Routine w reflex microscopic - CBC with Differential/Platelet - BASIC METABOLIC PANEL WITH GFR - Hepatic function panel - Magnesium  8.  Screening for AAA (aortic abdominal aneurysm)  - [place], RETROPERITNL ABD,  LTD  9. Screening for ischemic heart disease  - EKG 12-Lead  10. Need for prophylactic vaccination with tetanus-diphtheria (TD)  - Td : Tetanus/diphtheria >7yo Preservative  free   Continue prudent diet as discussed, weight control, BP monitoring, regular exercise, and medications. Discussed med's effects and SE's. Screening labs and tests as requested with regular follow-up as recommended. Over 40 minutes of exam, counseling, chart review and high complex critical decision making was performed.

## 2015-12-02 DIAGNOSIS — N202 Calculus of kidney with calculus of ureter: Secondary | ICD-10-CM | POA: Diagnosis not present

## 2015-12-02 LAB — URINALYSIS, ROUTINE W REFLEX MICROSCOPIC
Bilirubin Urine: NEGATIVE
GLUCOSE, UA: NEGATIVE
HGB URINE DIPSTICK: NEGATIVE
Ketones, ur: NEGATIVE
LEUKOCYTES UA: NEGATIVE
Nitrite: NEGATIVE
PH: 8 (ref 5.0–8.0)
Protein, ur: NEGATIVE
SPECIFIC GRAVITY, URINE: 1.02 (ref 1.001–1.035)

## 2015-12-02 LAB — HEPATIC FUNCTION PANEL
ALBUMIN: 4.4 g/dL (ref 3.6–5.1)
ALK PHOS: 76 U/L (ref 33–130)
ALT: 15 U/L (ref 6–29)
AST: 16 U/L (ref 10–35)
Bilirubin, Direct: 0.2 mg/dL (ref <=0.2)
Indirect Bilirubin: 0.5 mg/dL (ref 0.2–1.2)
Total Bilirubin: 0.7 mg/dL (ref 0.2–1.2)
Total Protein: 6.7 g/dL (ref 6.1–8.1)

## 2015-12-02 LAB — MICROALBUMIN / CREATININE URINE RATIO
CREATININE, URINE: 203 mg/dL (ref 20–320)
MICROALB UR: 2.1 mg/dL — AB
Microalb Creat Ratio: 10 mcg/mg creat (ref <30)

## 2015-12-02 LAB — BASIC METABOLIC PANEL WITH GFR
BUN: 11 mg/dL (ref 7–25)
CHLORIDE: 104 mmol/L (ref 98–110)
CO2: 26 mmol/L (ref 20–31)
CREATININE: 0.77 mg/dL (ref 0.60–0.93)
Calcium: 9.7 mg/dL (ref 8.6–10.4)
GFR, Est African American: 87 mL/min (ref >=60)
GFR, Est Non African American: 76 mL/min (ref >=60)
GLUCOSE: 91 mg/dL (ref 65–99)
POTASSIUM: 4.2 mmol/L (ref 3.5–5.3)
Sodium: 140 mmol/L (ref 135–146)

## 2015-12-02 LAB — TSH: TSH: 1.22 m[IU]/L

## 2015-12-02 LAB — INSULIN, RANDOM: INSULIN: 5.9 u[IU]/mL (ref 2.0–19.6)

## 2015-12-02 LAB — LIPID PANEL
Cholesterol: 145 mg/dL (ref 125–200)
HDL: 50 mg/dL (ref >=46)
LDL CALC: 67 mg/dL (ref <130)
Total CHOL/HDL Ratio: 2.9 Ratio (ref <=5.0)
Triglycerides: 142 mg/dL (ref <150)
VLDL: 28 mg/dL (ref <30)

## 2015-12-02 LAB — MAGNESIUM: Magnesium: 2 mg/dL (ref 1.5–2.5)

## 2015-12-02 LAB — VITAMIN D 25 HYDROXY (VIT D DEFICIENCY, FRACTURES): Vit D, 25-Hydroxy: 46 ng/mL (ref 30–100)

## 2015-12-22 DIAGNOSIS — D352 Benign neoplasm of pituitary gland: Secondary | ICD-10-CM | POA: Diagnosis not present

## 2015-12-23 DIAGNOSIS — E236 Other disorders of pituitary gland: Secondary | ICD-10-CM | POA: Diagnosis not present

## 2015-12-23 DIAGNOSIS — D352 Benign neoplasm of pituitary gland: Secondary | ICD-10-CM | POA: Diagnosis not present

## 2015-12-29 DIAGNOSIS — D352 Benign neoplasm of pituitary gland: Secondary | ICD-10-CM | POA: Diagnosis not present

## 2015-12-29 DIAGNOSIS — I10 Essential (primary) hypertension: Secondary | ICD-10-CM | POA: Diagnosis not present

## 2015-12-29 DIAGNOSIS — Z6826 Body mass index (BMI) 26.0-26.9, adult: Secondary | ICD-10-CM | POA: Diagnosis not present

## 2016-01-12 ENCOUNTER — Ambulatory Visit: Payer: Self-pay | Admitting: Internal Medicine

## 2016-01-19 ENCOUNTER — Ambulatory Visit: Payer: Self-pay | Admitting: Internal Medicine

## 2016-01-22 ENCOUNTER — Other Ambulatory Visit: Payer: Self-pay | Admitting: Internal Medicine

## 2016-03-07 ENCOUNTER — Ambulatory Visit: Payer: Self-pay | Admitting: Internal Medicine

## 2016-04-12 ENCOUNTER — Other Ambulatory Visit: Payer: Self-pay | Admitting: Internal Medicine

## 2016-04-13 ENCOUNTER — Other Ambulatory Visit: Payer: Self-pay

## 2016-04-13 MED ORDER — LISINOPRIL 10 MG PO TABS: 10.0000 mg | ORAL_TABLET | Freq: Every day | ORAL | 0 refills | Status: DC

## 2016-04-20 ENCOUNTER — Other Ambulatory Visit: Payer: Self-pay | Admitting: Internal Medicine

## 2016-04-21 ENCOUNTER — Encounter (HOSPITAL_COMMUNITY): Payer: Self-pay

## 2016-04-21 ENCOUNTER — Emergency Department (HOSPITAL_COMMUNITY)
Admission: EM | Admit: 2016-04-21 | Discharge: 2016-04-21 | Disposition: A | Discharge status: Home / Self Care | Payer: Medicare Other | Attending: Emergency Medicine | Admitting: Emergency Medicine

## 2016-04-21 DIAGNOSIS — I10 Essential (primary) hypertension: Secondary | ICD-10-CM | POA: Insufficient documentation

## 2016-04-21 DIAGNOSIS — Z7982 Long term (current) use of aspirin: Secondary | ICD-10-CM | POA: Diagnosis not present

## 2016-04-21 DIAGNOSIS — Z85038 Personal history of other malignant neoplasm of large intestine: Secondary | ICD-10-CM | POA: Insufficient documentation

## 2016-04-21 DIAGNOSIS — R55 Syncope and collapse: Secondary | ICD-10-CM | POA: Insufficient documentation

## 2016-04-21 DIAGNOSIS — R42 Dizziness and giddiness: Secondary | ICD-10-CM | POA: Diagnosis not present

## 2016-04-21 DIAGNOSIS — R404 Transient alteration of awareness: Secondary | ICD-10-CM | POA: Diagnosis not present

## 2016-04-21 LAB — I-STAT CHEM 8, ED
BUN: 14 mg/dL (ref 6–20)
CHLORIDE: 104 mmol/L (ref 101–111)
CREATININE: 0.7 mg/dL (ref 0.44–1.00)
Calcium, Ion: 1.16 mmol/L (ref 1.15–1.40)
GLUCOSE: 119 mg/dL — AB (ref 65–99)
HCT: 38 % (ref 36.0–46.0)
Hemoglobin: 12.9 g/dL (ref 12.0–15.0)
POTASSIUM: 3.8 mmol/L (ref 3.5–5.1)
Sodium: 141 mmol/L (ref 135–145)
TCO2: 25 mmol/L (ref 0–100)

## 2016-04-21 NOTE — ED Triage Notes (Signed)
[facility]- pt was outside bending over planting flowers and became very dizzy. Family reports syncopal episode however pt denies. Hx of vertigo. Pt hypertensive with EMS as well as on arrival. 12 lead unremarkable.

## 2016-04-21 NOTE — ED Provider Notes (Signed)
Kristen [name]  [facility]-EMERGENCY DEPT Provider Note   CSN: [id number] Arrival date & time: 04/21/16  1330  History   Chief Complaint Chief Complaint  Patient presents with  . Near Syncope    HPI Kristen [name] is a 75 y.o. female.   Near Syncope  This is a recurrent problem. The current episode started 3 to 5 hours ago. The problem occurs rarely. The problem has been resolved. Pertinent negatives include no chest pain, no abdominal pain, no headaches and no shortness of breath.  Dizziness  Quality:  Lightheadedness and vertigo Severity:  Mild Onset quality:  Sudden Timing:  Rare Progression:  Resolved Chronicity:  New Context: head movement and standing up   Associated symptoms: vomiting   Associated symptoms: no blood in stool, no chest pain, no diarrhea, no headaches, no shortness of breath, no tinnitus and no vision changes   Risk factors: hx of vertigo   Risk factors: no multiple medications and no new medications     Past Medical History:  Diagnosis Date  . Arthritis   . Colon cancer ([place]) 1990s, 2014  . Colon polyps   . Depression   . Dysrhythmia 04/2012   SVT - RATE 145 POST OP CRANIOTOMY SURGERY --TX'D AND RESOLVED--PT STATES NO FURTHER PROBLEM THAT SHE IS AWARE OF -SHE HAS NOT HAD TO SEE CARDIOLOGIST  . GERD (gastroesophageal reflux disease)   . Headache(784.0)    hx migraines  . Hypertension    pt states she has not taken any b/p medication since her brain surgery in oct 3013--took herself off the medication  . Pituitary mass ([place]) NOV 2013   MASS FOUND AFTER PT EXPERIENCED BILATERAL BLURRED / HAZY VISION -ESP LEFT EYE.  S/P CRANIOTOMY AND EYESIGHT RETURNED TO NORMAL--PT STILL HAS A LOT OF TENDERNESS RIGHT SIDE OF NOSE --THE CRANIOTOMY WAS DONE BY TRANSNASAL APPROACH  . Pneumonia yrs ago   hx of pneumonia  . PONV (postoperative nausea and vomiting)    PT STATES PLEASE NOTE SHE HAS TENDERNESS RIGHT SIDE OF NOSE - SINCE TRANSNASAL CRANIOTOMY 04/2012  . Prediabetes     . Vitamin D deficiency     Patient Active Problem List   Diagnosis Date Noted  . Ureteral stone with hydronephrosis 09/30/2015  . Hypertension 08/19/2014  . Syncope 08/18/2014  . Pituitary tumor (excised 2013) 03/12/2014  . Medication management 09/10/2013  . Hyperlipidemia 06/11/2013  . Vitamin D deficiency   . Prediabetes   . History of colon cancer, stage II 10/12/2012  . Obesity (BMI 30-39.9) 07/01/2012  . GERD (gastroesophageal reflux disease)   . Depression, controlled   . SVT (supraventricular tachycardia) ([place]) 04/21/2012  . Adenomatous polyps-recurrent 02/03/2012    Past Surgical History:  Procedure Laterality Date  . BACK SURGERY     lower  . CATARACT EXTRACTION  June/july 2013   bilateral eyes  . COLON RESECTION  1988  . COLON SURGERY  1990's  . COLONOSCOPY  09/20/2011   Procedure: COLONOSCOPY;  Surgeon: Missy Sabins, MD;  Location: [facility];  Service: Endoscopy;  Laterality: N/A;  . COLONOSCOPY  12/15/2011   Procedure: COLONOSCOPY;  Surgeon: Missy Sabins, MD;  Location: [facility];  Service: Endoscopy;  Laterality: N/A;  . COLONOSCOPY  01/26/2012   Procedure: COLONOSCOPY;  Surgeon: Cleotis Nipper, MD;  Location: [facility];  Service: Endoscopy;  Laterality: N/A;  . CRANIOTOMY  04/18/2012   Procedure: CRANIOTOMY HYPOPHYSECTOMY TRANSNASAL APPROACH;  Surgeon: Ophelia Charter, MD;  Location: [facility];  Service:  Neurosurgery;  Laterality: Bilateral;  Transphenoidal resection of Pituitary tumor.   . CYSTOSCOPY WITH URETEROSCOPY AND STENT PLACEMENT Bilateral 10/01/2015   Procedure: CYSTOSCOPY, retrograde WITH URETEROSCOPY AND STENT PLACEMENT, ;  Surgeon: Raynelle Bring, MD;  Location: [facility];  Service: Urology;  Laterality: Bilateral;  . CYSTOSCOPY WITH URETEROSCOPY AND STENT PLACEMENT Right 10/19/2015   Procedure: CYSTOSCOPY,  RIGHT URETEROSCOPY, HOLMIUM LASER RIGHT URETERAL STONE, RIGHT URETERAL STENT PLACEMENT, LEFT URETEROSCOPY, INSERTION LEFT URETERAL  STENT;  Surgeon: Raynelle Bring, MD;  Location: [facility];  Service: Urology;  Laterality: Right;  . EYE SURGERY     cataract bil  . HOLMIUM LASER APPLICATION Right [id number]   Procedure: HOLMIUM LASER  ;  Surgeon: Raynelle Bring, MD;  Location: [facility];  Service: Urology;  Laterality: Right;  . HOT HEMOSTASIS  09/20/2011   Procedure: HOT HEMOSTASIS (ARGON PLASMA COAGULATION/BICAP);  Surgeon: Missy Sabins, MD;  Location: [facility];  Service: Endoscopy;  Laterality: N/A;  . LAPAROSCOPIC RIGHT HEMI COLECTOMY  06/27/2012   Procedure: LAPAROSCOPIC RIGHT HEMI COLECTOMY;  Surgeon: Pedro Earls, MD;  Location: [facility];  Service: General;  Laterality: N/A;  Laparoscopic Assited Right Hemicolectomy  . TRANSNASAL APPROACH  04/18/2012   Procedure: TRANSNASAL APPROACH;  Surgeon: Rozetta Nunnery, MD;  Location: [facility];  Service: ENT;  Laterality: Bilateral;  Transnasal approach  . TUMOR REMOVAL     Left leg    OB History    No data available       Home Medications    Prior to Admission medications   Medication Sig Start Date End Date Taking? Authorizing Provider  aspirin EC 81 MG tablet Take 81 mg by mouth daily.    Historical Provider, MD  atorvastatin (LIPITOR) 80 MG tablet TAKE ONE TABLET BY MOUTH EVERY DAY FOF CHOLESTEROL 04/20/16   Unk Pinto, MD  Cholecalciferol 1000 units tablet Take 1,500 Units by mouth daily.    Historical Provider, MD  lisinopril (PRINIVIL,ZESTRIL) 10 MG tablet Take 1 tablet (10 mg total) by mouth daily. 04/13/16   Vicie Mutters, PA-C  PARoxetine (PAXIL) 20 MG tablet TAKE ONE TABLET BY MOUTH EVERY DAY 04/20/16   Unk Pinto, MD  ranitidine (ZANTAC) 75 MG tablet Take 75 mg by mouth 2 (two) times daily. Takes OTC PRN    Historical Provider, MD    Family History Family History  Problem Relation Age of Onset  . Colon cancer Sister 26  . Heart disease Mother   . Heart disease Father   . Cancer Maternal Aunt     unknown form of cancer dx in her  15s-50s  . Liver cancer Maternal Uncle     heavy ETOH user  . Breast cancer Sister 66  . Dementia Sister   . Colon cancer Cousin   . Colon polyps Son   . Colon polyps Son   . Colon cancer Other 30  . Colon polyps Other   . Brain cancer Cousin     dx in his 25s; paternal cousin    Social History Social History  Substance Use Topics  . Smoking status: Never Smoker  . Smokeless tobacco: Never Used  . Alcohol use No     Comment: a couple times a year, one drink at a time   Allergies   Aspirin; Codeine; Fenofibrate; Hydrochlorothiazide; and Metoclopramide  Review of Systems Review of Systems  Constitutional: Negative for diaphoresis, fatigue and fever.  HENT: Negative for tinnitus.   Respiratory: Negative for shortness of breath.  Cardiovascular: Positive for near-syncope. Negative for chest pain.  Gastrointestinal: Positive for vomiting. Negative for abdominal pain, blood in stool, constipation and diarrhea.  Genitourinary: Negative for dysuria.  Neurological: Positive for dizziness and light-headedness. Negative for speech difficulty and headaches.  Psychiatric/Behavioral: Negative for confusion.  All other systems reviewed and are negative.  Physical Exam Updated Vital Signs BP (!) 186/108 (BP Location: Right Arm)   Pulse 65   Temp 97.8 F (36.6 C) (Oral)   Resp 17   Ht 5\' 6"  (1.676 m)   Wt 73.9 kg   SpO2 100%   BMI 26.31 kg/m   Physical Exam  Constitutional: She is oriented to person, place, and time. She appears well-developed and well-nourished. No distress.  HENT:  Head: Normocephalic and atraumatic.  Eyes: EOM are normal. Pupils are equal, round, and reactive to light.  Neck: Normal range of motion. Neck supple.  Cardiovascular: Normal rate and regular rhythm.   Pulmonary/Chest: Effort normal and breath sounds normal. No respiratory distress. She has no wheezes.  Abdominal: Soft. She exhibits no distension. There is no tenderness.  Musculoskeletal:  Normal range of motion. She exhibits no edema.  Neurological: She is alert and oriented to person, place, and time. No cranial nerve deficit. Coordination normal.  5/5 bilateral intrinsic hand, bicep flexion, tricep extension  5/5 bilateral plantar flexion, dorsiflexion and hip flexion  Normal finger to nose, normal heel to shin. No dysmetria.  Normal gait.  Skin: Skin is warm. Capillary refill takes less than 2 seconds. She is not diaphoretic. No erythema.  Psychiatric: She has a normal mood and affect. Her behavior is normal. Thought content normal.  Nursing note and vitals reviewed.  ED Treatments / Results  Labs (all labs ordered are listed, but only abnormal results are displayed) Labs Reviewed  I-STAT CHEM 8, ED - Abnormal; Notable for the following:       Result Value   Glucose, Bld 119 (*)    All other components within normal limits  CBG MONITORING, ED   EKG  EKG Interpretation  Date/Time:  Thursday April 21 2016 13:31:52 EST Ventricular Rate:  56 PR Interval:    QRS Duration: 98 QT Interval:  431 QTC Calculation: 416 R Axis:   9 Text Interpretation:  Sinus rhythm Low voltage, precordial leads No significant change since last tracing Confirmed by FLOYD MD, Quillian Quince [id number]) on 04/21/2016 3:35:52 PM      Radiology No results found.  Procedures Procedures (including critical care time)  Medications Ordered in ED Medications - No data to display  Initial Impression / Assessment and Plan / ED Course  I have reviewed the triage vital signs and the nursing notes.  Pertinent labs & imaging results that were available during my care of the patient were reviewed by me and considered in my medical decision making (see chart for details).  Clinical Course    Patient is a pleasant 75 year old female who presents to emergency department today with near syncopal episode after standing up from guarding. Patient has a history of vertigo and states this felt very similar  as when she stood up she felt lightheaded and the room was spinning. This resolved with rest.   She had no additional signs or symptoms including chest pain, shortness of breath or headache.   Patient denies any additional symptoms prior to this event. She denies any nausea, vomiting, diarrhea or blood in her urine or stool.  Patient well appearing and symptoms have completely resolved. EKG shows no signs  of ischemia.   Patient's symptoms are consistent with vasovagal syncope and the vertigo. Neuro exam is unremarkable. Doubt her room spinning secondary to central process.  [place], doubt PE.  Labs unremarkable.   [place] Syncope Rule negative.   Patient discharged home in good health.   Final Clinical Impressions(s) / ED Diagnoses   Final diagnoses:  Near syncope  Dizziness     Roberto Scales, MD 04/21/16 Daniel, DO 04/21/16 1554

## 2016-04-21 NOTE — ED Notes (Signed)
Pt discharged, advised to return to ED for new weakness, chest pain, or shortness of breath. Pt denies any complaints. Denied need for wheelchair. Ambulatory and escorted to lobby.

## 2016-05-17 ENCOUNTER — Ambulatory Visit (INDEPENDENT_AMBULATORY_CARE_PROVIDER_SITE_OTHER): Payer: Medicare Other | Admitting: Internal Medicine

## 2016-05-17 ENCOUNTER — Encounter: Payer: Self-pay | Admitting: Internal Medicine

## 2016-05-17 ENCOUNTER — Ambulatory Visit (HOSPITAL_COMMUNITY)
Admission: RE | Admit: 2016-05-17 | Discharge: 2016-05-17 | Disposition: A | Discharge status: Home / Self Care | Payer: Medicare Other | Source: Ambulatory Visit | Attending: Internal Medicine | Admitting: Internal Medicine

## 2016-05-17 VITALS — BP 158/84 | HR 74 | Temp 98.0°F | Resp 16 | Ht 64.0 in

## 2016-05-17 DIAGNOSIS — W19XXXA Unspecified fall, initial encounter: Secondary | ICD-10-CM | POA: Insufficient documentation

## 2016-05-17 DIAGNOSIS — M25571 Pain in right ankle and joints of right foot: Secondary | ICD-10-CM | POA: Insufficient documentation

## 2016-05-17 DIAGNOSIS — S82491A Other fracture of shaft of right fibula, initial encounter for closed fracture: Secondary | ICD-10-CM | POA: Insufficient documentation

## 2016-05-17 DIAGNOSIS — S82831A Other fracture of upper and lower end of right fibula, initial encounter for closed fracture: Secondary | ICD-10-CM | POA: Diagnosis not present

## 2016-05-17 MED ORDER — MELOXICAM 15 MG PO TABS: 15.0000 mg | ORAL_TABLET | Freq: Every day | ORAL | 0 refills | Status: DC

## 2016-05-17 NOTE — Patient Instructions (Signed)
Ankle Sprain, Phase I Rehab  Ask your health care provider which exercises are safe for you. Do exercises exactly as told by your health care provider and adjust them as directed. It is normal to feel mild stretching, pulling, tightness, or discomfort as you do these exercises, but you should stop right away if you feel sudden pain or your pain gets worse. Do not begin these exercises until told by your health care provider.  Stretching and range of motion exercises  These exercises warm up your muscles and joints and improve the movement and flexibility of your lower leg and ankle. These exercises also help to relieve pain and stiffness.  Exercise A: Gastroc and soleus stretch     1. Sit on the floor with your left / right leg extended.  2. Loop a belt or towel around the ball of your left / right foot. The ball of your foot is on the walking surface, right under your toes.  3. Keep your left / right ankle and foot relaxed and keep your knee straight while you use the belt or towel to pull your foot toward you. You should feel a gentle stretch behind your calf or knee.  4. Hold this position for __________ seconds, then release to the starting position.  Repeat the exercise with your knee bent. You can put a pillow or a rolled bath towel under your knee to support it. You should feel a stretch deep in your calf or at your Achilles tendon.  Repeat each stretch __________ times. Complete these stretches __________ times a day.  Exercise B: Ankle alphabet     1. Sit with your left / right leg supported at the lower leg.  ? Do not rest your foot on anything.  ? Make sure your foot has room to move freely.  2. Think of your left / right foot as a paintbrush, and move your foot to trace each letter of the alphabet in the air. Keep your hip and knee still while you trace. Make the letters as large as you can without feeling discomfort.  3. Trace every letter from A to Z.  Repeat __________ times. Complete this exercise  __________ times a day.  Strengthening exercises  These exercises build strength and endurance in your ankle and lower leg. Endurance is the ability to use your muscles for a long time, even after they get tired.  Exercise C: Dorsiflexors     1. Secure a rubber exercise band or tube to an object, such as a table leg, that will stay still when the band is pulled. Secure the other end around your left / right foot.  2. Sit on the floor facing the object, with your left / right leg extended. The band or tube should be slightly tense when your foot is relaxed.  3. Slowly bring your foot toward you, pulling the band tighter.  4. Hold this position for __________ seconds.  5. Slowly return your foot to the starting position.  Repeat __________ times. Complete this exercise __________ times a day.  Exercise D: Plantar flexors     1. Sit on the floor with your left / right leg extended.  2. Loop a rubber exercise tube or band around the ball of your left / right foot. The ball of your foot is on the walking surface, right under your toes.  ? Hold the ends of the band or tube in your hands.  ? The band or tube should be slightly   tense when your foot is relaxed.  3. Slowly point your foot and toes downward, pushing them away from you.  4. Hold this position for __________ seconds.  5. Slowly return your foot to the starting position.  Repeat __________ times. Complete this exercise __________ times a day.  Exercise E: Evertors   1. Sit on the floor with your legs straight out in front of you.  2. Loop a rubber exercise band or tube around the ball of your left / right foot. The ball of your foot is on the walking surface, right under your toes.  ? Hold the ends of the band in your hands, or secure the band to a stable object.  ? The band or tube should be slightly tense when your foot is relaxed.  3. Slowly push your foot outward, away from your other leg.  4. Hold this position for __________ seconds.  5. Slowly return your  foot to the starting position.  Repeat __________ times. Complete this exercise __________ times a day.  This information is not intended to replace advice given to you by your health care provider. Make sure you discuss any questions you have with your health care provider.  Document Released: 12/22/2004 Document Revised: 01/28/2016 Document Reviewed: 04/06/2015  Elsevier Interactive Patient Education © 2017 [employer]

## 2016-05-17 NOTE — Progress Notes (Signed)
   Subjective:    Patient ID: Kristen [name], female    DOB: [birth year], 75 y.o.   MRN: [id number]  HPI  Patient reports for right ankle pain.  She reports that she twisted it at church.  She reports that she thinks that she stepped on a piece of ice.  It did roll under.  She has been able to walk on it.  She does have some swelling.  No bruising.  She has rolled ankles remotely.  No OTC meds.  Hurts to walk and move it.    Review of Systems  Constitutional: Negative for chills, fatigue and fever.  Respiratory: Negative for chest tightness and shortness of breath.   Gastrointestinal: Negative for nausea and vomiting.  Musculoskeletal: Positive for arthralgias and joint swelling.  Skin: Positive for color change.       Objective:   Physical Exam  Constitutional: She is oriented to person, place, and time. She appears well-developed and well-nourished. No distress.  HENT:  Head: Normocephalic.  Mouth/Throat: Oropharynx is clear and moist. No oropharyngeal exudate.  Eyes: Conjunctivae are normal. No scleral icterus.  Neck: Normal range of motion. Neck supple. No JVD present. No thyromegaly present.  Cardiovascular: Normal rate, regular rhythm, normal heart sounds and intact distal pulses.  Exam reveals no gallop and no friction rub.   No murmur heard. Pulmonary/Chest: Effort normal and breath sounds normal. No respiratory distress. She has no wheezes. She has no rales. She exhibits no tenderness.  Musculoskeletal:       Right ankle: She exhibits decreased range of motion, swelling and ecchymosis. She exhibits no deformity, no laceration and normal pulse. Tenderness. Lateral malleolus, AITFL, CF ligament and proximal fibula tenderness found. No medial malleolus, no posterior TFL and no head of 5th metatarsal tenderness found. Achilles tendon normal.       Feet:  Lymphadenopathy:    She has no cervical adenopathy.  Neurological: She is alert and oriented to person, place, and time.    Skin: She is not diaphoretic.  Nursing note and vitals reviewed.   Vitals:   05/17/16 1418  BP: (!) 158/84  Pulse: 74  Resp: 16  Temp: 98 F (36.7 C)          Assessment & Plan:    1. Acute right ankle pain -concern for fibular fracture vs. Ankle sprain -meloxciam -rewrapped ankle in ACE bandage but feel that if it is just a sprain may benefit from ASO vs. CAM walker - DG Ankle Complete Right; Future - DG Foot Complete Right; Future

## 2016-05-24 DIAGNOSIS — S82831A Other fracture of upper and lower end of right fibula, initial encounter for closed fracture: Secondary | ICD-10-CM | POA: Diagnosis not present

## 2016-06-15 ENCOUNTER — Encounter: Payer: Self-pay | Admitting: Internal Medicine

## 2016-06-15 ENCOUNTER — Ambulatory Visit (INDEPENDENT_AMBULATORY_CARE_PROVIDER_SITE_OTHER): Payer: Medicare Other | Admitting: Internal Medicine

## 2016-06-15 VITALS — BP 154/86 | HR 90 | Temp 98.4°F | Resp 16 | Ht 64.0 in

## 2016-06-15 DIAGNOSIS — Z0001 Encounter for general adult medical examination with abnormal findings: Secondary | ICD-10-CM

## 2016-06-15 DIAGNOSIS — R7303 Prediabetes: Secondary | ICD-10-CM | POA: Diagnosis not present

## 2016-06-15 DIAGNOSIS — N132 Hydronephrosis with renal and ureteral calculous obstruction: Secondary | ICD-10-CM | POA: Diagnosis not present

## 2016-06-15 DIAGNOSIS — E559 Vitamin D deficiency, unspecified: Secondary | ICD-10-CM

## 2016-06-15 DIAGNOSIS — E782 Mixed hyperlipidemia: Secondary | ICD-10-CM | POA: Diagnosis not present

## 2016-06-15 DIAGNOSIS — R55 Syncope and collapse: Secondary | ICD-10-CM | POA: Diagnosis not present

## 2016-06-15 DIAGNOSIS — R6889 Other general symptoms and signs: Secondary | ICD-10-CM

## 2016-06-15 DIAGNOSIS — D497 Neoplasm of unspecified behavior of endocrine glands and other parts of nervous system: Secondary | ICD-10-CM

## 2016-06-15 DIAGNOSIS — Z79899 Other long term (current) drug therapy: Secondary | ICD-10-CM | POA: Diagnosis not present

## 2016-06-15 DIAGNOSIS — E669 Obesity, unspecified: Secondary | ICD-10-CM

## 2016-06-15 DIAGNOSIS — I10 Essential (primary) hypertension: Secondary | ICD-10-CM

## 2016-06-15 DIAGNOSIS — D369 Benign neoplasm, unspecified site: Secondary | ICD-10-CM | POA: Diagnosis not present

## 2016-06-15 DIAGNOSIS — Z0000 Encounter for general adult medical examination without abnormal findings: Secondary | ICD-10-CM

## 2016-06-15 DIAGNOSIS — F329 Major depressive disorder, single episode, unspecified: Secondary | ICD-10-CM

## 2016-06-15 DIAGNOSIS — K219 Gastro-esophageal reflux disease without esophagitis: Secondary | ICD-10-CM

## 2016-06-15 DIAGNOSIS — Z85038 Personal history of other malignant neoplasm of large intestine: Secondary | ICD-10-CM

## 2016-06-15 DIAGNOSIS — I471 Supraventricular tachycardia: Secondary | ICD-10-CM | POA: Diagnosis not present

## 2016-06-15 DIAGNOSIS — F32A Depression, unspecified: Secondary | ICD-10-CM

## 2016-06-15 LAB — CBC WITH DIFFERENTIAL/PLATELET
BASOS ABS: 0 {cells}/uL (ref 0–200)
Basophils Relative: 0 %
EOS PCT: 2 %
Eosinophils Absolute: 180 cells/uL (ref 15–500)
HCT: 38.6 % (ref 35.0–45.0)
HEMOGLOBIN: 12.5 g/dL (ref 11.7–15.5)
LYMPHS ABS: 2160 {cells}/uL (ref 850–3900)
LYMPHS PCT: 24 %
MCH: 28.2 pg (ref 27.0–33.0)
MCHC: 32.4 g/dL (ref 32.0–36.0)
MCV: 87.1 fL (ref 80.0–100.0)
MPV: 10 fL (ref 7.5–12.5)
Monocytes Absolute: 450 cells/uL (ref 200–950)
Monocytes Relative: 5 %
NEUTROS PCT: 69 %
Neutro Abs: 6210 cells/uL (ref 1500–7800)
Platelets: 285 10*3/uL (ref 140–400)
RBC: 4.43 MIL/uL (ref 3.80–5.10)
RDW: 13.6 % (ref 11.0–15.0)
WBC: 9 10*3/uL (ref 3.8–10.8)

## 2016-06-15 LAB — TSH: TSH: 2.11 m[IU]/L

## 2016-06-15 MED ORDER — LISINOPRIL 10 MG PO TABS: 10.0000 mg | ORAL_TABLET | Freq: Every day | ORAL | 0 refills | Status: DC

## 2016-06-15 MED ORDER — RANITIDINE HCL 75 MG PO TABS: 75.0000 mg | ORAL_TABLET | Freq: Two times a day (BID) | ORAL | 1 refills | Status: DC

## 2016-06-15 NOTE — Progress Notes (Signed)
MEDICARE ANNUAL WELLNESS VISIT AND FOLLOW UP Assessment:    1. Hypertension -slightly elevated today -Kristen [name] is on OTC cough syrup for resolving URI -monitor at home, if consistently elevated Kristen [name] will likely need to have change in medications -dash diet -exercise as tolerated - TSH  2. Hyperlipidemia -cont lipitor - Lipid panel  3. Prediabetes -cont diet and exercise - Hemoglobin A1c  4. Vitamin D deficiency -cont Vit D  5. Medication management  - CBC with Differential/Platelet - BASIC METABOLIC PANEL WITH GFR - Hepatic function panel  6. SVT (supraventricular tachycardia) (HCC) -not currently symptomatic -followed by cards  7. Syncope, unspecified syncope type -has had full cards workup  8. Gastroesophageal reflux disease, esophagitis presence not specified -cont zantac BID  9. Pituitary tumor (excised 2013) -has been excised  10. Ureteral stone with hydronephrosis -not currently symptomatic  11. Adenomatous polyps-recurrent -followed by GI -has regular colonoscopy  12. Depression, controlled -cont paxil  13. History of colon cancer, stage II -followed by GI  14. Obesity (BMI 30-39.9) -cont diet and exercise  15.  Medicare wellness visit -due next year    Over 30 minutes of exam, counseling, chart review, and critical decision making was performed  Future Appointments Date Time Provider [place]  01/04/2017 2:00 PM Unk Pinto, MD [facility] None     Plan:   During the course of the visit the patient was educated and counseled about appropriate screening and preventive services including:    Pneumococcal vaccine   Influenza vaccine  Prevnar 13  Td vaccine  Screening electrocardiogram  Colorectal cancer screening  Diabetes screening  Glaucoma screening  Nutrition counseling    Subjective:  Kristen [name] is a 76 y.o. female who presents for Medicare Annual Wellness Visit and 3 month follow up for HTN,  hyperlipidemia, prediabetes, and vitamin D Def.   Her blood pressure has been controlled at home, today their BP is BP: (!) 154/86 Kristen [name] does not workout. Kristen [name] denies chest pain, shortness of breath, dizziness.   Kristen [name] is currently using OTC cough syrup.  Kristen [name] reports that Kristen [name] is doing this because of recent URI.   Kristen [name] is on cholesterol medication and denies myalgias. Her cholesterol is at goal. The cholesterol last visit was:   Lab Results  Component Value Date   CHOL 134 06/15/2016   HDL 38 (L) 06/15/2016   LDLCALC 55 06/15/2016   TRIG 205 (H) 06/15/2016   CHOLHDL 3.5 06/15/2016   Historically Kristen [name] has had well controlled prediabetes secondary to diet and exercise.  Kristen [name] has been disabled secondary to her fibular fracture.  Kristen [name] reports that her cast is supposed to be taken off today and Kristen [name] will slowly increase back to her usual walking pattern.   :  Lab Results  Component Value Date   HGBA1C 5.7 (H) 06/15/2016   Last GFR Lab Results  Component Value Date   GFRNONAA 85 06/15/2016     Lab Results  Component Value Date   GFRAA >89 06/15/2016   Patient is on Vitamin D supplement.   Lab Results  Component Value Date   VD25OH 46 12/01/2015      Medication Review: Current Outpatient Prescriptions on File Prior to Visit  Medication Sig Dispense Refill  . aspirin EC 81 MG tablet Take 81 mg by mouth daily.    Marland Kitchen atorvastatin (LIPITOR) 80 MG tablet TAKE ONE TABLET BY MOUTH EVERY DAY FOF CHOLESTEROL (Patient taking differently: Take 80 mg by mouth once a day) 90 tablet  0  . Cholecalciferol 1000 units tablet Take 1,500 Units by mouth daily.    . meloxicam (MOBIC) 15 MG tablet Take 1 tablet (15 mg total) by mouth daily. 90 tablet 0  . PARoxetine (PAXIL) 20 MG tablet TAKE ONE TABLET BY MOUTH EVERY DAY (Patient taking differently: Take 20 mg by mouth once a day) 90 tablet 0   No current facility-administered medications on file prior to visit.     Allergies: Allergies  Allergen  Reactions  . Aspirin Other (See Comments)    High Doses stomach trouble  . Codeine Nausea And Vomiting  . Fenofibrate Cough  . Hydrochlorothiazide Other (See Comments)    Fatigue  . Metoclopramide Other (See Comments)    Sedation    Current Problems (verified) has Adenomatous polyps-recurrent; SVT (supraventricular tachycardia) ([place]); GERD (gastroesophageal reflux disease); Depression, controlled; Obesity (BMI 30-39.9); History of colon cancer, stage II; Vitamin D deficiency; Prediabetes; Hyperlipidemia; Medication management; Pituitary tumor (excised 2013); Syncope; Hypertension; and Ureteral stone with hydronephrosis on her problem list.  Screening Tests Immunization History  Administered Date(s) Administered  . Influenza Split 04/19/2012  . Influenza, High Dose Seasonal PF 04/17/2013, 03/12/2014  . Pneumococcal Conjugate-13 09/30/2014  . Pneumococcal Polysaccharide-23 04/19/2012  . Td 07/20/2005, 12/01/2015    Preventative care: DEXA: Declined Mammogram:  2015 Last colonoscopy: 2015  Prior vaccinations: TD or Tdap: 2017  Influenza: 2017  Pneumococcal: 2013 Prevnar13: 2016   Names of Other Physician/Practitioners you currently use: 1. [facility] here for primary care 2. Dr. Gershon Crane, eye doctor, last visit 2017 3. Does not see, dentist, last visit 2017 Patient Care Team: Unk Pinto, MD as PCP - General (Internal Medicine)  Surgical: Kristen [name]  has a past surgical history that includes Colon resection (1988); Colonoscopy (09/20/2011); Hot hemostasis (09/20/2011); Cataract extraction (June/july 2013); Colonoscopy (12/15/2011); Colonoscopy (01/26/2012); Eye surgery; Tumor removal; Craniotomy (04/18/2012); Transnasal approach (04/18/2012); Laparoscopic right hemi colectomy (06/27/2012); Cystoscopy with ureteroscopy and stent placement (Bilateral, 10/01/2015); Back surgery; Colon surgery (1990's); Cystoscopy with ureteroscopy and stent placement  (Right, 10/19/2015); and Holmium laser application (Right, [id number]). Family Her family history includes Brain cancer in her cousin; Breast cancer (age of onset: 56) in her sister; Cancer in her maternal aunt; Colon cancer in her cousin; Colon cancer (age of onset: 62) in her other; Colon cancer (age of onset: 21) in her sister; Colon polyps in her other, son, and son; Dementia in her sister; Heart disease in her father and mother; Liver cancer in her maternal uncle. Social history  Kristen [name] reports that Kristen [name] has never smoked. Kristen [name] has never used smokeless tobacco. Kristen [name] reports that Kristen [name] does not drink alcohol or use drugs.  MEDICARE WELLNESS OBJECTIVES: Physical activity: Current Exercise Habits: Home exercise routine, Type of exercise: walking, Time (Minutes): 40, Frequency (Times/Week): 6, Weekly Exercise (Minutes/Week): 240, Intensity: Moderate Cardiac risk factors: Cardiac Risk Factors include: advanced age (>29men, >38 women);dyslipidemia;hypertension;sedentary lifestyle;obesity (BMI >30kg/m2) Depression/mood screen:   Depression screen [facility] 2/9 06/15/2016  Decreased Interest 0  Down, Depressed, Hopeless 0  PHQ - 2 Score 0    ADLs:  In your present state of health, do you have any difficulty performing the following activities: 06/15/2016 12/01/2015  Hearing? N N  Vision? N N  Difficulty concentrating or making decisions? N N  Walking or climbing stairs? N N  Dressing or bathing? N N  Doing errands, shopping? N N  Preparing Food and eating ? N -  Using the Toilet? N -  In the past  six months, have you accidently leaked urine? N -  Do you have problems with loss of bowel control? N -  Managing your Medications? N -  Managing your Finances? N -  Housekeeping or managing your Housekeeping? N -  Some recent data might be hidden     Cognitive Testing  Alert? Yes  Normal Appearance?Yes  Oriented to person? Yes  Place? Yes   Time? Yes  Recall of three objects?  Yes  Can perform simple  calculations? Yes  Displays appropriate judgment?Yes  Can read the correct time from a watch face?Yes  EOL planning: Does Patient Have a Medical Advance Directive?: Yes Type of Advance Directive: [place] Does patient want to make changes to medical advance directive?: Yes (MAU/Ambulatory/Procedural Areas - Information given) Copy of [place] in Chart?: No - copy requested   Objective:   Today's Vitals   06/15/16 1549  BP: (!) 154/86  Pulse: 90  Resp: 16  Temp: 98.4 F (36.9 C)  TempSrc: Temporal  Height: 5\' 4"  (1.626 m)   There is no height or weight on file to calculate BMI.  General appearance: alert, no distress, WD/WN, female HEENT: normocephalic, sclerae anicteric, TMs pearly, nares patent, no discharge or erythema, pharynx normal Oral cavity: MMM, no lesions Neck: supple, no lymphadenopathy, no thyromegaly, no masses Heart: RRR, normal S1, S2, no murmurs Lungs: CTA bilaterally, no wheezes, rhonchi, or rales Abdomen: +bs, soft, non tender, non distended, no masses, no hepatomegaly, no splenomegaly Musculoskeletal: Cast present on the right ankle, nontender, no swelling, no obvious deformity Extremities: no edema, no cyanosis, no clubbing Pulses: 2+ symmetric, upper and lower extremities, normal cap refill Neurological: alert, oriented x 3, CN2-12 intact, strength normal upper extremities and lower extremities, sensation normal throughout, DTRs 2+ throughout, no cerebellar signs, gait normal Psychiatric: normal affect, behavior normal, pleasant   Medicare Attestation I have personally reviewed: The patient's medical and social history Their use of alcohol, tobacco or illicit drugs Their current medications and supplements The patient's functional ability including ADLs,fall risks, home safety risks, cognitive, and hearing and visual impairment Diet and physical activities Evidence for depression or mood disorders  The  patient's weight, height, BMI, and visual acuity have been recorded in the chart.  I have made referrals, counseling, and provided education to the patient based on review of the above and I have provided the patient with a written personalized care plan for preventive services.     Starlyn Skeans, PA-C   06/16/2016

## 2016-06-16 DIAGNOSIS — S82831D Other fracture of upper and lower end of right fibula, subsequent encounter for closed fracture with routine healing: Secondary | ICD-10-CM | POA: Diagnosis not present

## 2016-06-16 LAB — BASIC METABOLIC PANEL WITH GFR
BUN: 16 mg/dL (ref 7–25)
CALCIUM: 9.7 mg/dL (ref 8.6–10.4)
CO2: 27 mmol/L (ref 20–31)
CREATININE: 0.68 mg/dL (ref 0.60–0.93)
Chloride: 107 mmol/L (ref 98–110)
GFR, Est African American: >89 mL/min (ref >=60)
GFR, Est Non African American: 85 mL/min (ref >=60)
GLUCOSE: 82 mg/dL (ref 65–99)
Potassium: 4.7 mmol/L (ref 3.5–5.3)
SODIUM: 142 mmol/L (ref 135–146)

## 2016-06-16 LAB — HEPATIC FUNCTION PANEL
ALT: 14 U/L (ref 6–29)
AST: 15 U/L (ref 10–35)
Albumin: 4.1 g/dL (ref 3.6–5.1)
Alkaline Phosphatase: 80 U/L (ref 33–130)
BILIRUBIN DIRECT: 0.1 mg/dL (ref <=0.2)
Indirect Bilirubin: 0.4 mg/dL (ref 0.2–1.2)
TOTAL PROTEIN: 6.7 g/dL (ref 6.1–8.1)
Total Bilirubin: 0.5 mg/dL (ref 0.2–1.2)

## 2016-06-16 LAB — LIPID PANEL
CHOLESTEROL: 134 mg/dL (ref <200)
HDL: 38 mg/dL — ABNORMAL LOW (ref >50)
LDL Cholesterol: 55 mg/dL (ref <100)
TRIGLYCERIDES: 205 mg/dL — AB (ref <150)
Total CHOL/HDL Ratio: 3.5 Ratio (ref <5.0)
VLDL: 41 mg/dL — ABNORMAL HIGH (ref <30)

## 2016-06-16 LAB — HEMOGLOBIN A1C
Hgb A1c MFr Bld: 5.7 % — ABNORMAL HIGH (ref <5.7)
Mean Plasma Glucose: 117 mg/dL

## 2016-07-08 DIAGNOSIS — S82831D Other fracture of upper and lower end of right fibula, subsequent encounter for closed fracture with routine healing: Secondary | ICD-10-CM | POA: Diagnosis not present

## 2016-07-29 DIAGNOSIS — S82831D Other fracture of upper and lower end of right fibula, subsequent encounter for closed fracture with routine healing: Secondary | ICD-10-CM | POA: Diagnosis not present

## 2016-09-20 ENCOUNTER — Other Ambulatory Visit: Payer: Self-pay | Admitting: Internal Medicine

## 2016-11-02 DIAGNOSIS — N200 Calculus of kidney: Secondary | ICD-10-CM | POA: Diagnosis not present

## 2016-11-10 ENCOUNTER — Other Ambulatory Visit: Payer: Self-pay | Admitting: *Deleted

## 2016-11-10 MED ORDER — PAROXETINE HCL 20 MG PO TABS: 20.0000 mg | ORAL_TABLET | Freq: Every day | ORAL | 0 refills | Status: DC

## 2016-11-16 ENCOUNTER — Ambulatory Visit: Payer: Self-pay | Admitting: Internal Medicine

## 2016-11-22 ENCOUNTER — Encounter: Payer: Self-pay | Admitting: Internal Medicine

## 2016-11-29 ENCOUNTER — Other Ambulatory Visit: Payer: Self-pay | Admitting: *Deleted

## 2016-11-29 ENCOUNTER — Encounter: Payer: Self-pay | Admitting: Internal Medicine

## 2016-11-29 ENCOUNTER — Ambulatory Visit (INDEPENDENT_AMBULATORY_CARE_PROVIDER_SITE_OTHER): Payer: Medicare Other | Admitting: Physician Assistant

## 2016-11-29 VITALS — BP 150/78 | HR 64 | Temp 97.3°F | Resp 16 | Ht 64.0 in | Wt 161.8 lb

## 2016-11-29 DIAGNOSIS — E782 Mixed hyperlipidemia: Secondary | ICD-10-CM

## 2016-11-29 DIAGNOSIS — I10 Essential (primary) hypertension: Secondary | ICD-10-CM

## 2016-11-29 DIAGNOSIS — Z79899 Other long term (current) drug therapy: Secondary | ICD-10-CM | POA: Diagnosis not present

## 2016-11-29 DIAGNOSIS — R7303 Prediabetes: Secondary | ICD-10-CM | POA: Diagnosis not present

## 2016-11-29 DIAGNOSIS — E559 Vitamin D deficiency, unspecified: Secondary | ICD-10-CM

## 2016-11-29 LAB — CBC WITH DIFFERENTIAL/PLATELET
BASOS ABS: 0 {cells}/uL (ref 0–200)
Basophils Relative: 0 %
EOS ABS: 156 {cells}/uL (ref 15–500)
Eosinophils Relative: 2 %
HCT: 39.3 % (ref 35.0–45.0)
Hemoglobin: 12.9 g/dL (ref 11.7–15.5)
LYMPHS PCT: 23 %
Lymphs Abs: 1794 cells/uL (ref 850–3900)
MCH: 28.9 pg (ref 27.0–33.0)
MCHC: 32.8 g/dL (ref 32.0–36.0)
MCV: 87.9 fL (ref 80.0–100.0)
MONOS PCT: 7 %
MPV: 10.8 fL (ref 7.5–12.5)
Monocytes Absolute: 546 cells/uL (ref 200–950)
NEUTROS PCT: 68 %
Neutro Abs: 5304 cells/uL (ref 1500–7800)
PLATELETS: 260 10*3/uL (ref 140–400)
RBC: 4.47 MIL/uL (ref 3.80–5.10)
RDW: 13.9 % (ref 11.0–15.0)
WBC: 7.8 10*3/uL (ref 3.8–10.8)

## 2016-11-29 MED ORDER — PAROXETINE HCL 20 MG PO TABS: 20.0000 mg | ORAL_TABLET | Freq: Every day | ORAL | 0 refills | Status: DC

## 2016-11-29 NOTE — Progress Notes (Signed)
Patient ID: ALEA RYER, female   DOB: [birth year], 76 y.o.   MRN: [id number]  Assessment and Plan:  Hypertension:  -Continue medication If BP is continued to be elevated at home, increase lisinopril to 20mg  daily -monitor blood pressure at home.  -Continue DASH diet.   -Reminder to go to the ER if any CP, SOB, nausea, dizziness, severe HA, changes vision/speech, left arm numbness and tingling, and jaw pain.  Cholesterol: -cut to 1/2 three days per week due to side effects -Continue diet and exercise.  -Check cholesterol.   Pre-diabetes: -Continue diet and exercise.  -Check A1C  Vitamin D Def: -check level -continue medications.   Continue diet and meds as discussed. Further disposition pending results of labs. Future Appointments Date Time Provider [place]  01/04/2017 2:00 PM Unk Pinto, MD [facility] None    HPI 76 y.o. [facility] female  presents for 3 month follow up with hypertension, hyperlipidemia, prediabetes and vitamin D.   Her blood pressure has not been checked at home but she has way to check it, today their BP is BP: (!) 150/78.   She does not workout. She denies chest pain, shortness of breath, dizziness.  BP Readings from Last 3 Encounters:  11/29/16 (!) 150/78  06/15/16 (!) 154/86  05/17/16 (!) 158/84   She broke her ankle in Dec, had cast x 6 weeks and she is doing better now, not driving at this time because her licence elapsed.    She is on cholesterol medication, she is on 1/2 pill 3 days a week and denies myalgias. Her cholesterol is at goal. The cholesterol last visit was:   Lab Results  Component Value Date   CHOL 134 06/15/2016   HDL 38 (L) 06/15/2016   LDLCALC 55 06/15/2016   TRIG 205 (H) 06/15/2016   CHOLHDL 3.5 06/15/2016    She has not been working on diet and exercise for prediabetes, and denies foot ulcerations, hyperglycemia, hypoglycemia , increased appetite, nausea, paresthesia of the feet, polydipsia, polyuria, vomiting and  weight loss. Last A1C in the office was:  Lab Results  Component Value Date   HGBA1C 5.7 (H) 06/15/2016   Patient is on Vitamin D supplement.  Lab Results  Component Value Date   VD25OH 46 12/01/2015     She has been seeing Dr. Arnoldo Morale for her pituitary tumor recurrence and follows with Dr. Zadie Rhine.   BMI is Body mass index is 27.77 kg/m., she is working on diet and exercise. Wt Readings from Last 3 Encounters:  11/29/16 161 lb 12.8 oz (73.4 kg)  04/21/16 163 lb (73.9 kg)  12/01/15 160 lb 12.8 oz (72.9 kg)    Current Medications:  Current Outpatient Prescriptions on File Prior to Visit  Medication Sig Dispense Refill  . aspirin EC 81 MG tablet Take 81 mg by mouth daily.    Marland Kitchen atorvastatin (LIPITOR) 80 MG tablet TAKE ONE TABLET BY MOUTH EVERY DAY FOF CHOLESTEROL (Patient taking differently: Take 80 mg by mouth once a day) 90 tablet 0  . Cholecalciferol 1000 units tablet Take 1,500 Units by mouth daily.    Marland Kitchen lisinopril (PRINIVIL,ZESTRIL) 10 MG tablet TAKE ONE TABLET BY MOUTH EVERY DAY 90 tablet 0  . meloxicam (MOBIC) 15 MG tablet Take 1 tablet (15 mg total) by mouth daily. 90 tablet 0  . ranitidine (ZANTAC) 75 MG tablet Take 1 tablet (75 mg total) by mouth 2 (two) times daily. 180 tablet 1   No current facility-administered medications on file prior to  visit.     Medical History:  Past Medical History:  Diagnosis Date  . Arthritis   . Colon cancer ([place]) 1990s, 2014  . Colon polyps   . Depression   . Dysrhythmia 04/2012   SVT - RATE 145 POST OP CRANIOTOMY SURGERY --TX'D AND RESOLVED--PT STATES NO FURTHER PROBLEM THAT SHE IS AWARE OF -SHE HAS NOT HAD TO SEE CARDIOLOGIST  . GERD (gastroesophageal reflux disease)   . Headache(784.0)    hx migraines  . Hypertension    pt states she has not taken any b/p medication since her brain surgery in oct 3013--took herself off the medication  . Pituitary mass ([place]) NOV 2013   MASS FOUND AFTER PT EXPERIENCED BILATERAL BLURRED / HAZY VISION  -ESP LEFT EYE.  S/P CRANIOTOMY AND EYESIGHT RETURNED TO NORMAL--PT STILL HAS A LOT OF TENDERNESS RIGHT SIDE OF NOSE --THE CRANIOTOMY WAS DONE BY TRANSNASAL APPROACH  . Pneumonia yrs ago   hx of pneumonia  . PONV (postoperative nausea and vomiting)    PT STATES PLEASE NOTE SHE HAS TENDERNESS RIGHT SIDE OF NOSE - SINCE TRANSNASAL CRANIOTOMY 04/2012  . Prediabetes   . Vitamin D deficiency     Allergies:  Allergies  Allergen Reactions  . Aspirin Other (See Comments)    High Doses stomach trouble  . Codeine Nausea And Vomiting  . Fenofibrate Cough  . Hydrochlorothiazide Other (See Comments)    Fatigue  . Metoclopramide Other (See Comments)    Sedation     Review of Systems:  Review of Systems  Constitutional: Negative for chills, fever and malaise/fatigue.  HENT: Negative for congestion, hearing loss and sore throat.   Eyes: Negative for blurred vision.  Respiratory: Negative for cough, shortness of breath and wheezing.   Cardiovascular: Negative for chest pain, palpitations and leg swelling.  Gastrointestinal: Negative for abdominal pain, blood in stool, constipation, diarrhea and melena.  Genitourinary: Negative.   Skin: Negative.   Neurological: Negative for dizziness, sensory change, loss of consciousness and headaches.  Psychiatric/Behavioral: Negative for depression. The patient is not nervous/anxious and does not have insomnia.     Family history- Review and unchanged  Social history- Review and unchanged  Physical Exam: BP (!) 150/78   Pulse 64   Temp 97.3 F (36.3 C)   Resp 16   Ht 5\' 4"  (1.626 m)   Wt 161 lb 12.8 oz (73.4 kg)   BMI 27.77 kg/m  Wt Readings from Last 3 Encounters:  11/29/16 161 lb 12.8 oz (73.4 kg)  04/21/16 163 lb (73.9 kg)  12/01/15 160 lb 12.8 oz (72.9 kg)    General Appearance: Well nourished well developed, in no apparent distress. Eyes: PERRLA, EOMs, conjunctiva no swelling or erythema ENT/Mouth: Ear canals normal without  obstruction, swelling, erythma, discharge.  TMs normal bilaterally.  Oropharynx moist, clear, without exudate, or postoropharyngeal swelling. Neck: Supple, thyroid normal,no cervical adenopathy  Respiratory: Respiratory effort normal, Breath sounds clear A&P without rhonchi, wheeze, or rale.  No retractions, no accessory usage. Cardio: RRR with no MRGs. Brisk peripheral pulses without edema.  Abdomen: Soft, + BS,  Non tender, no guarding, rebound, hernias, masses. Musculoskeletal: Full ROM, 5/5 strength, Normal gait Skin: Warm, dry without rashes, lesions, ecchymosis.  Neuro: Awake and oriented X 3, Cranial nerves intact. Normal muscle tone, no cerebellar symptoms. Psych: Normal affect, Insight and Judgment appropriate.    Vicie Mutters, PA-C 4:59 PM [facility]

## 2016-11-29 NOTE — Patient Instructions (Signed)
Monitor your blood pressure at home. If it stays above 140/80 please increase the lisinopril to 20mg  a day or 2 of the 10mg , and if this helps we can send in a new prescription for the 20mg  daily.    Go to the ER if any CP, SOB, nausea, dizziness, severe HA, changes vision/speech  Goal BP:  For patients younger than 60: Goal BP < 140/80. For patients 60 and older: Goal BP < 150/80. For patients with diabetes: Goal BP < 140/80. Your most recent BP: BP: (!) 150/78   Take your medications faithfully as instructed. Maintain a healthy weight. Get at least 150 minutes of aerobic exercise per week. Minimize salt intake. Minimize alcohol intake  DASH Eating Plan DASH stands for "Dietary Approaches to Stop Hypertension." The DASH eating plan is a healthy eating plan that has been shown to reduce high blood pressure (hypertension). Additional health benefits may include reducing the risk of type 2 diabetes mellitus, heart disease, and stroke. The DASH eating plan may also help with weight loss. WHAT DO I NEED TO KNOW ABOUT THE DASH EATING PLAN? For the DASH eating plan, you will follow these general guidelines:  Choose foods with a percent daily value for sodium of less than 5% (as listed on the food label).  Use salt-free seasonings or herbs instead of table salt or sea salt.  Check with your health care provider or pharmacist before using salt substitutes.  Eat lower-sodium products, often labeled as "lower sodium" or "no salt added."  Eat fresh foods.  Eat more vegetables, fruits, and low-fat dairy products.  Choose whole grains. Look for the word "whole" as the first word in the ingredient list.  Choose fish and skinless chicken or [place] more often than red meat. Limit fish, poultry, and meat to 6 oz (170 g) each day.  Limit sweets, desserts, sugars, and sugary drinks.  Choose heart-healthy fats.  Limit cheese to 1 oz (28 g) per day.  Eat more home-cooked food and less  restaurant, buffet, and fast food.  Limit fried foods.  Cook foods using methods other than frying.  Limit canned vegetables. If you do use them, rinse them well to decrease the sodium.  When eating at a restaurant, ask that your food be prepared with less salt, or no salt if possible. WHAT FOODS CAN I EAT? Seek help from a dietitian for individual calorie needs. Grains Whole grain or whole wheat bread. Vandewater rice. Whole grain or whole wheat pasta. Quinoa, bulgur, and whole grain cereals. Low-sodium cereals. Corn or whole wheat flour tortillas. Whole grain cornbread. Whole grain crackers. Low-sodium crackers. Vegetables Fresh or frozen vegetables (raw, steamed, roasted, or grilled). Low-sodium or reduced-sodium tomato and vegetable juices. Low-sodium or reduced-sodium tomato sauce and paste. Low-sodium or reduced-sodium canned vegetables.  Fruits All fresh, canned (in natural juice), or frozen fruits. Meat and Other Protein Products Ground beef (85% or leaner), grass-fed beef, or beef trimmed of fat. Skinless chicken or [place]. Ground chicken or [place]. Pork trimmed of fat. All fish and seafood. Eggs. Dried beans, peas, or lentils. Unsalted nuts and seeds. Unsalted canned beans. Dairy Low-fat dairy products, such as skim or 1% milk, 2% or reduced-fat cheeses, low-fat ricotta or cottage cheese, or plain low-fat yogurt. Low-sodium or reduced-sodium cheeses. Fats and Oils Tub margarines without trans fats. Light or reduced-fat mayonnaise and salad dressings (reduced sodium). Avocado. Safflower, olive, or canola oils. Natural peanut or almond butter. Other Unsalted popcorn and pretzels. The items listed above may  not be a complete list of recommended foods or beverages. Contact your dietitian for more options. WHAT FOODS ARE NOT RECOMMENDED? Grains White bread. White pasta. White rice. Refined cornbread. Bagels and croissants. Crackers that contain trans fat. Vegetables Creamed or fried  vegetables. Vegetables in a cheese sauce. Regular canned vegetables. Regular canned tomato sauce and paste. Regular tomato and vegetable juices. Fruits Dried fruits. Canned fruit in light or heavy syrup. Fruit juice. Meat and Other Protein Products Fatty cuts of meat. Ribs, chicken wings, bacon, sausage, bologna, salami, chitterlings, fatback, hot dogs, bratwurst, and packaged luncheon meats. Salted nuts and seeds. Canned beans with salt. Dairy Whole or 2% milk, cream, half-and-half, and cream cheese. Whole-fat or sweetened yogurt. Full-fat cheeses or blue cheese. Nondairy creamers and whipped toppings. Processed cheese, cheese spreads, or cheese curds. Condiments Onion and garlic salt, seasoned salt, table salt, and sea salt. Canned and packaged gravies. Worcestershire sauce. Tartar sauce. Barbecue sauce. Teriyaki sauce. Soy sauce, including reduced sodium. Steak sauce. Fish sauce. Oyster sauce. Cocktail sauce. Horseradish. Ketchup and mustard. Meat flavorings and tenderizers. Bouillon cubes. Hot sauce. Tabasco sauce. Marinades. Taco seasonings. Relishes. Fats and Oils Butter, stick margarine, lard, shortening, ghee, and bacon fat. Coconut, palm kernel, or palm oils. Regular salad dressings. Other Pickles and olives. Salted popcorn and pretzels. The items listed above may not be a complete list of foods and beverages to avoid. Contact your dietitian for more information. WHERE CAN I FIND MORE INFORMATION? National Heart, Lung, and Blood Institute: travelstabloid.com Document Released: 05/12/2011 Document Revised: 10/07/2013 Document Reviewed: 03/27/2013 [facility] Patient Information 2015 [place], [place]. This information is not intended to replace advice given to you by your health care provider. Make sure you discuss any questions you have with your health care provider.

## 2016-11-30 LAB — BASIC METABOLIC PANEL WITH GFR
BUN: 16 mg/dL (ref 7–25)
CO2: 25 mmol/L (ref 20–31)
CREATININE: 0.6 mg/dL (ref 0.60–0.93)
Calcium: 9.4 mg/dL (ref 8.6–10.4)
Chloride: 106 mmol/L (ref 98–110)
GFR, Est Non African American: 89 mL/min (ref >=60)
Glucose, Bld: 86 mg/dL (ref 65–99)
Potassium: 4 mmol/L (ref 3.5–5.3)
Sodium: 141 mmol/L (ref 135–146)

## 2016-11-30 LAB — LIPID PANEL
Cholesterol: 132 mg/dL (ref <200)
HDL: 35 mg/dL — AB (ref >50)
LDL Cholesterol: 42 mg/dL (ref <100)
TRIGLYCERIDES: 274 mg/dL — AB (ref <150)
Total CHOL/HDL Ratio: 3.8 Ratio (ref <5.0)
VLDL: 55 mg/dL — ABNORMAL HIGH (ref <30)

## 2016-11-30 LAB — HEPATIC FUNCTION PANEL
ALBUMIN: 4.3 g/dL (ref 3.6–5.1)
ALT: 15 U/L (ref 6–29)
AST: 16 U/L (ref 10–35)
Alkaline Phosphatase: 79 U/L (ref 33–130)
BILIRUBIN TOTAL: 0.5 mg/dL (ref 0.2–1.2)
Bilirubin, Direct: 0.1 mg/dL (ref <=0.2)
Indirect Bilirubin: 0.4 mg/dL (ref 0.2–1.2)
TOTAL PROTEIN: 6.6 g/dL (ref 6.1–8.1)

## 2016-11-30 LAB — MAGNESIUM: MAGNESIUM: 2.1 mg/dL (ref 1.5–2.5)

## 2016-11-30 LAB — HEMOGLOBIN A1C
Hgb A1c MFr Bld: 5.7 % — ABNORMAL HIGH (ref <5.7)
Mean Plasma Glucose: 117 mg/dL

## 2016-11-30 LAB — TSH: TSH: 1.53 m[IU]/L

## 2016-11-30 NOTE — Progress Notes (Signed)
LVM for pt to return office call for LAB results.

## 2017-01-04 ENCOUNTER — Encounter: Payer: Self-pay | Admitting: Internal Medicine

## 2017-01-30 DIAGNOSIS — D352 Benign neoplasm of pituitary gland: Secondary | ICD-10-CM | POA: Diagnosis not present

## 2017-02-01 DIAGNOSIS — D352 Benign neoplasm of pituitary gland: Secondary | ICD-10-CM | POA: Diagnosis not present

## 2017-03-02 ENCOUNTER — Ambulatory Visit (INDEPENDENT_AMBULATORY_CARE_PROVIDER_SITE_OTHER): Payer: Medicare Other | Admitting: Internal Medicine

## 2017-03-02 VITALS — BP 136/80 | HR 68 | Temp 97.8°F | Resp 18 | Ht 65.0 in | Wt 163.4 lb

## 2017-03-02 DIAGNOSIS — I10 Essential (primary) hypertension: Secondary | ICD-10-CM | POA: Diagnosis not present

## 2017-03-02 DIAGNOSIS — K219 Gastro-esophageal reflux disease without esophagitis: Secondary | ICD-10-CM

## 2017-03-02 DIAGNOSIS — R7303 Prediabetes: Secondary | ICD-10-CM

## 2017-03-02 DIAGNOSIS — E782 Mixed hyperlipidemia: Secondary | ICD-10-CM

## 2017-03-02 DIAGNOSIS — Z136 Encounter for screening for cardiovascular disorders: Secondary | ICD-10-CM

## 2017-03-02 DIAGNOSIS — Z23 Encounter for immunization: Secondary | ICD-10-CM | POA: Diagnosis not present

## 2017-03-02 DIAGNOSIS — E559 Vitamin D deficiency, unspecified: Secondary | ICD-10-CM | POA: Diagnosis not present

## 2017-03-02 DIAGNOSIS — Z79899 Other long term (current) drug therapy: Secondary | ICD-10-CM | POA: Diagnosis not present

## 2017-03-02 DIAGNOSIS — Z1212 Encounter for screening for malignant neoplasm of rectum: Secondary | ICD-10-CM

## 2017-03-02 NOTE — Progress Notes (Signed)
[facility] Unk Pinto, M.D.     Uvaldo Bristle. Silverio Lay, P.A.-C Liane Comber, [place] [address] [place] [address] Telephone [phone] Telefax [phone] Comprehensive Evaluation &  Examination     This very nice 76 y.o. [facility] presents for a comprehensive evaluation and management of multiple medical co-morbidities.  Patient has been followed for HTN, Prediabetes, Hyperlipidemia and Vitamin D Deficiency. In 2013, she underwent transnasal resection of a pituitary tumor.      HTN predates circa 1999. Patient's BP has been controlled at home and patient denies any cardiac symptoms as chest pain, palpitations, shortness of breath, dizziness or ankle swelling. Patient had a negative Cardiolite in 2011. Today's BP was initially elevated and normalized on recheck at goal - 136/80.      Patient's hyperlipidemia is controlled with diet and medications. Patient denies myalgias or other medication SE's. Last lipids were at goal albeit elevated Trig's: Lab Results  Component Value Date   CHOL 123 03/02/2017   HDL 38 (L) 03/02/2017   LDLCALC 42 11/29/2016   TRIG 258 (H) 03/02/2017   CHOLHDL 3.2 03/02/2017      Patient has prediabetes (A1c 5.9% / I 90 in 2011 and A1c 6.1% in 2012 & 2014).  Patient denies reactive hypoglycemic symptoms, visual blurring, diabetic polys, or paresthesias. Last A1c was at goal:  Lab Results  Component Value Date   HGBA1C 5.6 03/02/2017      Finally, patient has history of Vitamin D Deficiency ("31" in 2008)  and last Vitamin D was near goal (70-100): Lab Results  Component Value Date   VD25OH 58 03/02/2017   Current Outpatient Prescriptions on File Prior to Visit  Medication Sig  . aspirin EC 81 MG tablet Take 81 mg by mouth daily.  . Cholecalciferol 1000 units tablet Take 1,500 Units by mouth daily.  . meloxicam (MOBIC) 15 MG tablet Take 1 tablet (15 mg total) by mouth  daily.  . ranitidine (ZANTAC) 75 MG tablet Take 1 tablet (75 mg total) by mouth 2 (two) times daily.   No current facility-administered medications on file prior to visit.    Allergies  Allergen Reactions  . Aspirin Other (See Comments)    High Doses stomach trouble  . Codeine Nausea And Vomiting  . Fenofibrate Cough  . Hydrochlorothiazide Other (See Comments)    Fatigue  . Metoclopramide Other (See Comments)    Sedation   Past Medical History:  Diagnosis Date  . Arthritis   . Colon cancer ([place]) 1990s, 2014  . Colon polyps   . Depression   . Dysrhythmia 04/2012   SVT - RATE 145 POST OP CRANIOTOMY SURGERY --TX'D AND RESOLVED--PT STATES NO FURTHER PROBLEM THAT SHE IS AWARE OF -SHE HAS NOT HAD TO SEE CARDIOLOGIST  . GERD (gastroesophageal reflux disease)   . Headache(784.0)    hx migraines  . Hypertension    pt states she has not taken any b/p medication since her brain surgery in oct 3013--took herself off the medication  . Pituitary mass ([place]) NOV 2013   MASS FOUND AFTER PT EXPERIENCED BILATERAL BLURRED / HAZY VISION -ESP LEFT EYE.  S/P CRANIOTOMY AND EYESIGHT RETURNED TO NORMAL--PT STILL HAS A LOT OF TENDERNESS RIGHT SIDE OF NOSE --THE CRANIOTOMY WAS DONE BY TRANSNASAL APPROACH  . Pneumonia yrs ago   hx of pneumonia  . PONV (postoperative nausea and vomiting)    PT STATES PLEASE NOTE SHE HAS TENDERNESS RIGHT SIDE OF  NOSE - SINCE TRANSNASAL CRANIOTOMY 04/2012  . Prediabetes   . Vitamin D deficiency    Health Maintenance  Topic Date Due  . DEXA SCAN  06/04/2005  . TETANUS/TDAP  11/30/2025  . INFLUENZA VACCINE  Completed  . PNA vac Low Risk Adult  Completed   Immunization History  Administered Date(s) Administered  . Influenza Split 04/19/2012  . Influenza, High Dose Seasonal PF 04/17/2013, 03/12/2014, 03/03/2017  . Pneumococcal Conjugate-13 09/30/2014  . Pneumococcal Polysaccharide-23 04/19/2012  . Td 07/20/2005, 12/01/2015   Past Surgical History:  Procedure  Laterality Date  . BACK SURGERY     lower  . CATARACT EXTRACTION  June/july 2013   bilateral eyes  . COLON RESECTION  1988  . COLON SURGERY  1990's  . COLONOSCOPY  09/20/2011   Procedure: COLONOSCOPY;  Surgeon: Missy Sabins, MD;  Location: [facility];  Service: Endoscopy;  Laterality: N/A;  . COLONOSCOPY  12/15/2011   Procedure: COLONOSCOPY;  Surgeon: Missy Sabins, MD;  Location: [facility];  Service: Endoscopy;  Laterality: N/A;  . COLONOSCOPY  01/26/2012   Procedure: COLONOSCOPY;  Surgeon: Cleotis Nipper, MD;  Location: [facility];  Service: Endoscopy;  Laterality: N/A;  . CRANIOTOMY  04/18/2012   Procedure: CRANIOTOMY HYPOPHYSECTOMY TRANSNASAL APPROACH;  Surgeon: Ophelia Charter, MD;  Location: [facility];  Service: Neurosurgery;  Laterality: Bilateral;  Transphenoidal resection of Pituitary tumor.   . CYSTOSCOPY WITH URETEROSCOPY AND STENT PLACEMENT Bilateral 10/01/2015   Procedure: CYSTOSCOPY, retrograde WITH URETEROSCOPY AND STENT PLACEMENT, ;  Surgeon: Raynelle Bring, MD;  Location: [facility];  Service: Urology;  Laterality: Bilateral;  . CYSTOSCOPY WITH URETEROSCOPY AND STENT PLACEMENT Right 10/19/2015   Procedure: CYSTOSCOPY,  RIGHT URETEROSCOPY, HOLMIUM LASER RIGHT URETERAL STONE, RIGHT URETERAL STENT PLACEMENT, LEFT URETEROSCOPY, INSERTION LEFT URETERAL STENT;  Surgeon: Raynelle Bring, MD;  Location: [facility];  Service: Urology;  Laterality: Right;  . EYE SURGERY     cataract bil  . HOLMIUM LASER APPLICATION Right 12/07/5007   Procedure: HOLMIUM LASER  ;  Surgeon: Raynelle Bring, MD;  Location: [facility];  Service: Urology;  Laterality: Right;  . HOT HEMOSTASIS  09/20/2011   Procedure: HOT HEMOSTASIS (ARGON PLASMA COAGULATION/BICAP);  Surgeon: Missy Sabins, MD;  Location: [facility];  Service: Endoscopy;  Laterality: N/A;  . LAPAROSCOPIC RIGHT HEMI COLECTOMY  06/27/2012   Procedure: LAPAROSCOPIC RIGHT HEMI COLECTOMY;  Surgeon: Pedro Earls, MD;  Location: [facility];  Service:  General;  Laterality: N/A;  Laparoscopic Assited Right Hemicolectomy  . TRANSNASAL APPROACH  04/18/2012   Procedure: TRANSNASAL APPROACH;  Surgeon: Rozetta Nunnery, MD;  Location: [facility];  Service: ENT;  Laterality: Bilateral;  Transnasal approach  . TUMOR REMOVAL     Left leg   Family History  Problem Relation Age of Onset  . Colon cancer Sister 52  . Heart disease Mother   . Heart disease Father   . Cancer Maternal Aunt        unknown form of cancer dx in her 16s-50s  . Liver cancer Maternal Uncle        heavy ETOH user  . Breast cancer Sister 87  . Dementia Sister   . Colon cancer Cousin   . Colon polyps Son   . Colon polyps Son   . Colon cancer Other 30  . Colon polyps Other   . Brain cancer Cousin        dx in his 78s; paternal cousin   Social History  Substance  Use Topics  . Smoking status: Never Smoker  . Smokeless tobacco: Never Used  . Alcohol use No     Comment: a couple times a year, one drink at a time    ROS Constitutional: Denies fever, chills, weight loss/gain, headaches, insomnia,  night sweats, and change in appetite. Does c/o fatigue. Eyes: Denies redness, blurred vision, diplopia, discharge, itchy, watery eyes.  ENT: Denies discharge, congestion, post nasal drip, epistaxis, sore throat, earache, hearing loss, dental pain, Tinnitus, Vertigo, Sinus pain, snoring.  Cardio: Denies chest pain, palpitations, irregular heartbeat, syncope, dyspnea, diaphoresis, orthopnea, PND, claudication, edema Respiratory: denies cough, dyspnea, DOE, pleurisy, hoarseness, laryngitis, wheezing.  Gastrointestinal: Denies dysphagia, heartburn, reflux, water brash, pain, cramps, nausea, vomiting, bloating, diarrhea, constipation, hematemesis, melena, hematochezia, jaundice, hemorrhoids Genitourinary: Denies dysuria, frequency, urgency, nocturia, hesitancy, discharge, hematuria, flank pain Breast: Breast lumps, nipple discharge, bleeding.  Musculoskeletal: Denies  arthralgia, myalgia, stiffness, Jt. Swelling, pain, limp, and strain/sprain. Denies falls. Skin: Denies puritis, rash, hives, warts, acne, eczema, changing in skin lesion Neuro: No weakness, tremor, incoordination, spasms, paresthesia, pain Psychiatric: Denies confusion, memory loss, sensory loss. Denies Depression. Endocrine: Denies change in weight, skin, hair change, nocturia, and paresthesia, diabetic polys, visual blurring, hyper / hypo glycemic episodes.  Heme/Lymph: No excessive bleeding, bruising, enlarged lymph nodes.  Physical Exam  BP 136/80   Pulse 68   Temp 97.8 F (36.6 C)   Resp 18   Ht 5\' 5"  (1.651 m)   Wt 163 lb 6.4 oz (74.1 kg)   BMI 27.19 kg/m   General Appearance: Well nourished, well groomed and in no apparent distress.  Eyes: PERRLA, EOMs, conjunctiva no swelling or erythema, normal fundi and vessels. Sinuses: No frontal/maxillary tenderness ENT/Mouth: EACs patent / TMs  nl. Nares clear without erythema, swelling, mucoid exudates. Oral hygiene is good. No erythema, swelling, or exudate. Tongue normal, non-obstructing. Tonsils not swollen or erythematous. Hearing normal.  Neck: Supple, thyroid normal. No bruits, nodes or JVD. Respiratory: Respiratory effort normal.  BS equal and clear bilateral without rales, rhonci, wheezing or stridor. Cardio: Heart sounds are normal with regular rate and rhythm and no murmurs, rubs or gallops. Peripheral pulses are normal and equal bilaterally without edema. No aortic or femoral bruits. Chest: symmetric with normal excursions and percussion. Breasts: Symmetric, without lumps, nipple discharge, retractions, or fibrocystic changes.  Abdomen: Flat, soft with bowel sounds active. Nontender, no guarding, rebound, hernias, masses, or organomegaly.  Lymphatics: Non tender without lymphadenopathy. Musculoskeletal: Full ROM all peripheral extremities, joint stability, 5/5 strength, and normal gait. Skin: Warm and dry without rashes,  lesions, cyanosis, clubbing or  ecchymosis.  Neuro: Cranial nerves intact, reflexes equal bilaterally. Normal muscle tone, no cerebellar symptoms. Sensation intact.  Pysch: Alert and oriented X 3, normal affect, Insight and Judgment appropriate.   Assessment and Plan 1. Hypertension  - EKG 12-Lead - Urinalysis, Routine w reflex microscopic - Microalbumin / creatinine urine ratio - CBC with Differential/Platelet - BASIC METABOLIC PANEL WITH GFR - Magnesium - TSH  2. Hyperlipidemia, mixed  - EKG 12-Lead - Hepatic function panel - Lipid panel - TSH  3. Prediabetes  - EKG 12-Lead - Hemoglobin A1c - Insulin, random  4. Vitamin D deficiency  - VITAMIN D 25 Hydroxy  5. Gastroesophageal reflux disease   6. Screening for rectal cancer  - POC Hemoccult Bld/Stl   7. Screening for ischemic heart disease  - EKG 12-Lead  8. Medication management  - Urinalysis, Routine w reflex microscopic - Microalbumin / creatinine urine ratio -  CBC with Differential/Platelet - BASIC METABOLIC PANEL WITH GFR - Hepatic function panel - Magnesium - Lipid panel - TSH - Hemoglobin A1c - Insulin, random - VITAMIN D 25 Hydroxy   9. Need for immunization against influenza  - Flu vaccine HIGH DOSE PF (Fluzone High dose)          Patient was counseled in prudent diet to achieve/maintain BMI less than 25 for weight control, BP monitoring, regular exercise and medications. Discussed med's effects and SE's. Screening labs and tests as requested with regular follow-up as recommended. Over 40 minutes of exam, counseling, chart review and high complex critical decision making was performed.

## 2017-03-02 NOTE — Patient Instructions (Signed)

## 2017-03-03 ENCOUNTER — Other Ambulatory Visit: Payer: Self-pay | Admitting: Internal Medicine

## 2017-03-03 DIAGNOSIS — E782 Mixed hyperlipidemia: Secondary | ICD-10-CM | POA: Diagnosis not present

## 2017-03-03 DIAGNOSIS — Z23 Encounter for immunization: Secondary | ICD-10-CM | POA: Diagnosis not present

## 2017-03-03 DIAGNOSIS — I10 Essential (primary) hypertension: Secondary | ICD-10-CM | POA: Diagnosis not present

## 2017-03-03 DIAGNOSIS — Z136 Encounter for screening for cardiovascular disorders: Secondary | ICD-10-CM | POA: Diagnosis not present

## 2017-03-03 DIAGNOSIS — R7303 Prediabetes: Secondary | ICD-10-CM | POA: Diagnosis not present

## 2017-03-03 LAB — CBC WITH DIFFERENTIAL/PLATELET
BASOS PCT: 0.4 %
Basophils Absolute: 29 cells/uL (ref 0–200)
EOS ABS: 117 {cells}/uL (ref 15–500)
Eosinophils Relative: 1.6 %
HEMATOCRIT: 36.1 % (ref 35.0–45.0)
Hemoglobin: 12 g/dL (ref 11.7–15.5)
LYMPHS ABS: 1460 {cells}/uL (ref 850–3900)
MCH: 28.9 pg (ref 27.0–33.0)
MCHC: 33.2 g/dL (ref 32.0–36.0)
MCV: 87 fL (ref 80.0–100.0)
MPV: 11 fL (ref 7.5–12.5)
Monocytes Relative: 7.3 %
Neutro Abs: 5161 cells/uL (ref 1500–7800)
Neutrophils Relative %: 70.7 %
Platelets: 248 10*3/uL (ref 140–400)
RBC: 4.15 10*6/uL (ref 3.80–5.10)
RDW: 12.5 % (ref 11.0–15.0)
Total Lymphocyte: 20 %
WBC: 7.3 10*3/uL (ref 3.8–10.8)
WBCMIX: 533 {cells}/uL (ref 200–950)

## 2017-03-03 LAB — INSULIN, RANDOM: Insulin: 11.6 u[IU]/mL (ref 2.0–19.6)

## 2017-03-03 LAB — BASIC METABOLIC PANEL WITH GFR
BUN: 14 mg/dL (ref 7–25)
CALCIUM: 9.2 mg/dL (ref 8.6–10.4)
CO2: 27 mmol/L (ref 20–32)
Chloride: 110 mmol/L (ref 98–110)
Creat: 0.77 mg/dL (ref 0.60–0.93)
GFR, EST AFRICAN AMERICAN: 87 mL/min/{1.73_m2} (ref >=60)
GFR, EST NON AFRICAN AMERICAN: 75 mL/min/{1.73_m2} (ref >=60)
Glucose, Bld: 103 mg/dL — ABNORMAL HIGH (ref 65–99)
Potassium: 4.9 mmol/L (ref 3.5–5.3)
Sodium: 144 mmol/L (ref 135–146)

## 2017-03-03 LAB — URINALYSIS, ROUTINE W REFLEX MICROSCOPIC
BACTERIA UA: NONE SEEN /HPF
Bilirubin Urine: NEGATIVE
GLUCOSE, UA: NEGATIVE
HGB URINE DIPSTICK: NEGATIVE
Hyaline Cast: NONE SEEN /LPF
KETONES UR: NEGATIVE
Nitrite: NEGATIVE
PH: 6 (ref 5.0–8.0)
Protein, ur: NEGATIVE
Specific Gravity, Urine: 1.021 (ref 1.001–1.03)

## 2017-03-03 LAB — HEMOGLOBIN A1C
Hgb A1c MFr Bld: 5.6 % of total Hgb (ref <5.7)
Mean Plasma Glucose: 114 (calc)
eAG (mmol/L): 6.3 (calc)

## 2017-03-03 LAB — HEPATIC FUNCTION PANEL
AG RATIO: 1.8 (calc) (ref 1.0–2.5)
ALBUMIN MSPROF: 4.2 g/dL (ref 3.6–5.1)
ALT: 18 U/L (ref 6–29)
AST: 16 U/L (ref 10–35)
Alkaline phosphatase (APISO): 92 U/L (ref 33–130)
BILIRUBIN DIRECT: 0.1 mg/dL (ref 0.0–0.2)
BILIRUBIN TOTAL: 0.5 mg/dL (ref 0.2–1.2)
GLOBULIN: 2.3 g/dL (ref 1.9–3.7)
Indirect Bilirubin: 0.4 mg/dL (calc) (ref 0.2–1.2)
Total Protein: 6.5 g/dL (ref 6.1–8.1)

## 2017-03-03 LAB — LIPID PANEL
Cholesterol: 123 mg/dL (ref <200)
HDL: 38 mg/dL — ABNORMAL LOW (ref >50)
LDL CHOLESTEROL (CALC): 55 mg/dL
NON-HDL CHOLESTEROL (CALC): 85 mg/dL (ref <130)
TRIGLYCERIDES: 258 mg/dL — AB (ref <150)
Total CHOL/HDL Ratio: 3.2 (calc) (ref <5.0)

## 2017-03-03 LAB — MAGNESIUM: Magnesium: 1.9 mg/dL (ref 1.5–2.5)

## 2017-03-03 LAB — MICROALBUMIN / CREATININE URINE RATIO
Creatinine, Urine: 146 mg/dL (ref 20–275)
Microalb Creat Ratio: 10 mcg/mg creat (ref <30)
Microalb, Ur: 1.5 mg/dL

## 2017-03-03 LAB — VITAMIN D 25 HYDROXY (VIT D DEFICIENCY, FRACTURES): Vit D, 25-Hydroxy: 58 ng/mL (ref 30–100)

## 2017-03-03 LAB — TSH: TSH: 1.57 mIU/L (ref 0.40–4.50)

## 2017-03-04 ENCOUNTER — Encounter: Payer: Self-pay | Admitting: Internal Medicine

## 2017-06-07 NOTE — Progress Notes (Signed)
FOLLOW UP  Assessment and Plan:   Hypertension Elevated on current medications; will add amlodipine 2.5 mg daily Monitor blood pressure at home; patient to call if consistently greater than 150/90 Continue DASH diet.   Reminder to go to the ER if any CP, SOB, nausea, dizziness, severe HA, changes vision/speech, left arm numbness and tingling and jaw pain.  Cholesterol Currently at goal for LDLs- continue statin; triglycerides remain above goal - dietary changes for triglycerides discussed, information provided Continue low cholesterol diet and exercise.  Check lipid panel.   Prediabetes Most recent A1C improved in normal range Continue diet and exercise.  Perform daily foot/skin check, notify office of any concerning changes.  Check A1C  Overweight Long discussion about weight loss, diet, and exercise Recommended diet heavy in fruits and veggies and low in animal meats, cheeses, and dairy products, appropriate calorie intake Patient will work on increasing exercise,  Will follow up in 3 months  GERD Experiencing breakthrough; will increase ranitidine dose to 150 mg BID Discussed diet, avoiding triggers and other lifestyle changes  Depression/anxiety Well controlled; continue medications - has failed attempts at taper in the past Lifestyle discussed: diet/exerise, sleep hygiene, stress management, hydration  Vitamin D Def At goal at last visit; continue supplementation to maintain goal of 70-100 Defer Vit D level  Continue diet and meds as discussed. Further disposition pending results of labs. Discussed med's effects and SE's.   Over 30 minutes of exam, counseling, chart review, and critical decision making was performed.   Future Appointments  Date Time Provider [place]  09/07/2017  3:30 PM Unk Pinto, MD [facility] None  03/29/2018  3:00 PM Unk Pinto, MD [facility] None     ----------------------------------------------------------------------------------------------------------------------  HPI 77 y.o. female  presents for 3 month follow up on hypertension, cholesterol, prediabetes, weight, depression and vitamin D deficiency.   she has a diagnosis of depression and is currently on paxil 20 mg daily, reports symptoms are well controlled on current regimen. She reports she has been on this medication since she lost her son and husband. She has trialed tapers without success, and wishes to remain on at this time.   she has a diagnosis of GERD which is currently managed by ranitindine 75 mg BID she reports symptoms is not currently well controlled, and endorses breakthrough reflux and hoarseness, denies  burning in chest, cough.    BMI is Body mass index is 26.46 kg/m., she has been working on diet and exercise. Wt Readings from Last 3 Encounters:  06/08/17 159 lb (72.1 kg)  03/02/17 163 lb 6.4 oz (74.1 kg)  11/29/16 161 lb 12.8 oz (73.4 kg)   She does not check her BP at home, today their BP is BP: (!) 154/76 - similar on manual recheck by provider  She does workout; steps, daily walking, garden. She denies chest pain, shortness of breath, dizziness.   She is on cholesterol medication and denies myalgias. Her cholesterol is at goal for LDLs; triglycerides remain elevated. The cholesterol last visit was:   Lab Results  Component Value Date   CHOL 123 03/02/2017   HDL 38 (L) 03/02/2017   LDLCALC 42 11/29/2016   TRIG 258 (H) 03/02/2017   CHOLHDL 3.2 03/02/2017    She has been working on diet and exercise for prediabetes, and denies increased appetite, nausea, paresthesia of the feet, polydipsia, polyuria, visual disturbances and vomiting. Last A1C in the office was normalized:  Lab Results  Component Value Date   HGBA1C 5.6 03/02/2017  Patient is on Vitamin D supplement but remained below goal of 70:   Lab Results  Component Value Date   VD25OH  58 03/02/2017        Current Medications:  Current Outpatient Medications on File Prior to Visit  Medication Sig  . aspirin EC 81 MG tablet Take 81 mg by mouth daily.  Marland Kitchen atorvastatin (LIPITOR) 80 MG tablet TAKE ONE TABLET BY MOUTH EVERY DAY FOR CHOLESTEROL  . Cholecalciferol 1000 units tablet Take 1,500 Units by mouth daily.  Marland Kitchen lisinopril (PRINIVIL,ZESTRIL) 10 MG tablet TAKE ONE TABLET BY MOUTH EVERY DAY  . meloxicam (MOBIC) 15 MG tablet Take 1 tablet (15 mg total) by mouth daily.  Marland Kitchen PARoxetine (PAXIL) 20 MG tablet TAKE ONE TABLET BY MOUTH EVERY DAY  . ranitidine (ZANTAC) 75 MG tablet Take 1 tablet (75 mg total) by mouth 2 (two) times daily.   No current facility-administered medications on file prior to visit.      Allergies:  Allergies  Allergen Reactions  . Aspirin Other (See Comments)    High Doses stomach trouble  . Codeine Nausea And Vomiting  . Fenofibrate Cough  . Hydrochlorothiazide Other (See Comments)    Fatigue  . Metoclopramide Other (See Comments)    Sedation     Medical History:  Past Medical History:  Diagnosis Date  . Arthritis   . Colon cancer ([place]) 1990s, 2014  . Colon polyps   . Depression   . Dysrhythmia 04/2012   SVT - RATE 145 POST OP CRANIOTOMY SURGERY --TX'D AND RESOLVED--PT STATES NO FURTHER PROBLEM THAT SHE IS AWARE OF -SHE HAS NOT HAD TO SEE CARDIOLOGIST  . GERD (gastroesophageal reflux disease)   . Headache(784.0)    hx migraines  . Hypertension    pt states she has not taken any b/p medication since her brain surgery in oct 3013--took herself off the medication  . Pituitary mass ([place]) NOV 2013   MASS FOUND AFTER PT EXPERIENCED BILATERAL BLURRED / HAZY VISION -ESP LEFT EYE.  S/P CRANIOTOMY AND EYESIGHT RETURNED TO NORMAL--PT STILL HAS A LOT OF TENDERNESS RIGHT SIDE OF NOSE --THE CRANIOTOMY WAS DONE BY TRANSNASAL APPROACH  . Pneumonia yrs ago   hx of pneumonia  . PONV (postoperative nausea and vomiting)    PT STATES PLEASE NOTE SHE HAS  TENDERNESS RIGHT SIDE OF NOSE - SINCE TRANSNASAL CRANIOTOMY 04/2012  . Prediabetes   . Vitamin D deficiency    Family history- Reviewed and unchanged Social history- Reviewed and unchanged   Review of Systems:  Review of Systems  Constitutional: Negative for malaise/fatigue and weight loss.  HENT: Negative for hearing loss and tinnitus.   Eyes: Negative for blurred vision and double vision.  Respiratory: Negative for cough, shortness of breath and wheezing.   Cardiovascular: Negative for chest pain, palpitations, orthopnea, claudication and leg swelling.  Gastrointestinal: Positive for heartburn. Negative for abdominal pain, blood in stool, constipation, diarrhea, melena, nausea and vomiting.  Genitourinary: Negative.   Musculoskeletal: Negative for joint pain and myalgias.  Skin: Negative for rash.  Neurological: Negative for dizziness, tingling, sensory change, weakness and headaches.  Endo/Heme/Allergies: Negative for polydipsia.  Psychiatric/Behavioral: Negative.   All other systems reviewed and are negative.   Physical Exam: BP (!) 154/76   Pulse 61   Temp (!) 97.3 F (36.3 C)   Ht 5\' 5"  (1.651 m)   Wt 159 lb (72.1 kg)   SpO2 98%   BMI 26.46 kg/m  Wt Readings from Last 3 Encounters:  06/08/17 159 lb (72.1 kg)  03/02/17 163 lb 6.4 oz (74.1 kg)  11/29/16 161 lb 12.8 oz (73.4 kg)   General Appearance: Well nourished, in no apparent distress. Eyes: PERRLA, EOMs, conjunctiva no swelling or erythema Sinuses: No Frontal/maxillary tenderness ENT/Mouth: Ext aud canals clear, TMs without erythema, bulging. No erythema, swelling, or exudate on post pharynx.  Tonsils not swollen or erythematous. Hearing normal.  Neck: Supple, thyroid normal.  Respiratory: Respiratory effort normal, BS equal bilaterally without rales, rhonchi, wheezing or stridor.  Cardio: RRR with no MRGs. Brisk peripheral pulses without edema.  Abdomen: Soft, + BS.  Non tender, no guarding, rebound, hernias,  masses. Lymphatics: Non tender without lymphadenopathy.  Musculoskeletal: Full ROM, 5/5 strength, Normal gait Skin: Warm, dry without rashes, lesions, ecchymosis.  Neuro: Cranial nerves intact. No cerebellar symptoms.  Psych: Awake and oriented X 3, normal affect, Insight and Judgment appropriate.    Izora Ribas, NP 3:54 PM [facility]

## 2017-06-08 ENCOUNTER — Ambulatory Visit (INDEPENDENT_AMBULATORY_CARE_PROVIDER_SITE_OTHER): Payer: Medicare Other | Admitting: Adult Health

## 2017-06-08 ENCOUNTER — Encounter: Payer: Self-pay | Admitting: Adult Health

## 2017-06-08 VITALS — BP 154/76 | HR 61 | Temp 97.3°F | Ht 65.0 in | Wt 159.0 lb

## 2017-06-08 DIAGNOSIS — E663 Overweight: Secondary | ICD-10-CM | POA: Diagnosis not present

## 2017-06-08 DIAGNOSIS — E782 Mixed hyperlipidemia: Secondary | ICD-10-CM | POA: Diagnosis not present

## 2017-06-08 DIAGNOSIS — Z79899 Other long term (current) drug therapy: Secondary | ICD-10-CM | POA: Diagnosis not present

## 2017-06-08 DIAGNOSIS — F325 Major depressive disorder, single episode, in full remission: Secondary | ICD-10-CM

## 2017-06-08 DIAGNOSIS — R7303 Prediabetes: Secondary | ICD-10-CM

## 2017-06-08 DIAGNOSIS — E559 Vitamin D deficiency, unspecified: Secondary | ICD-10-CM

## 2017-06-08 DIAGNOSIS — K219 Gastro-esophageal reflux disease without esophagitis: Secondary | ICD-10-CM | POA: Diagnosis not present

## 2017-06-08 DIAGNOSIS — I10 Essential (primary) hypertension: Secondary | ICD-10-CM

## 2017-06-08 MED ORDER — RANITIDINE HCL 75 MG PO TABS: 150.0000 mg | ORAL_TABLET | Freq: Two times a day (BID) | ORAL | 1 refills | Status: DC

## 2017-06-08 MED ORDER — AMLODIPINE BESYLATE 2.5 MG PO TABS: 2.5000 mg | ORAL_TABLET | Freq: Every day | ORAL | 1 refills | Status: DC

## 2017-06-08 NOTE — Patient Instructions (Addendum)
Double up on ranitidine for 150 mg twice daily; new refill will be 150 mg dose - can take 1 tab twice daily   Food Choices for Gastroesophageal Reflux Disease, Adult When you have gastroesophageal reflux disease (GERD), the foods you eat and your eating habits are very important. Choosing the right foods can help ease the discomfort of GERD. Consider working with a diet and nutrition specialist (dietitian) to help you make healthy food choices. What general guidelines should I follow? Eating plan  Choose healthy foods low in fat, such as fruits, vegetables, whole grains, low-fat dairy products, and lean meat, fish, and poultry.  Eat frequent, small meals instead of three large meals each day. Eat your meals slowly, in a relaxed setting. Avoid bending over or lying down until 2-3 hours after eating.  Limit high-fat foods such as fatty meats or fried foods.  Limit your intake of oils, butter, and shortening to less than 8 teaspoons each day.  Avoid the following: ? Foods that cause symptoms. These may be different for different people. Keep a food diary to keep track of foods that cause symptoms. ? Alcohol. ? Drinking large amounts of liquid with meals. ? Eating meals during the 2-3 hours before bed.  Cook foods using methods other than frying. This may include baking, grilling, or broiling. Lifestyle   Maintain a healthy weight. Ask your health care provider what weight is healthy for you. If you need to lose weight, work with your health care provider to do so safely.  Exercise for at least 30 minutes on 5 or more days each week, or as told by your health care provider.  Avoid wearing clothes that fit tightly around your waist and chest.  Do not use any products that contain nicotine or tobacco, such as cigarettes and e-cigarettes. If you need help quitting, ask your health care provider.  Sleep with the head of your bed raised. Use a wedge under the mattress or blocks under the bed  frame to raise the head of the bed. What foods are not recommended? The items listed may not be a complete list. Talk with your dietitian about what dietary choices are best for you. Grains Pastries or quick breads with added fat. [place] toast. Vegetables Deep fried vegetables. [place] fries. Any vegetables prepared with added fat. Any vegetables that cause symptoms. For some people this may include tomatoes and tomato products, chili peppers, onions and garlic, and horseradish. Fruits Any fruits prepared with added fat. Any fruits that cause symptoms. For some people this may include citrus fruits, such as oranges, grapefruit, pineapple, and lemons. Meats and other protein foods High-fat meats, such as fatty beef or pork, hot dogs, ribs, ham, sausage, salami and bacon. Fried meat or protein, including fried fish and fried chicken. Nuts and nut butters. Dairy Whole milk and chocolate milk. Sour cream. Cream. Ice cream. Cream cheese. Milk shakes. Beverages Coffee and tea, with or without caffeine. Carbonated beverages. Sodas. Energy drinks. Fruit juice made with acidic fruits (such as orange or grapefruit). Tomato juice. Alcoholic drinks. Fats and oils Butter. Margarine. Shortening. Ghee. Sweets and desserts Chocolate and cocoa. Donuts. Seasoning and other foods Pepper. Peppermint and spearmint. Any condiments, herbs, or seasonings that cause symptoms. For some people, this may include curry, hot sauce, or vinegar-based salad dressings. Summary  When you have gastroesophageal reflux disease (GERD), food and lifestyle choices are very important to help ease the discomfort of GERD.  Eat frequent, small meals instead of three large  meals each day. Eat your meals slowly, in a relaxed setting. Avoid bending over or lying down until 2-3 hours after eating.  Limit high-fat foods such as fatty meat or fried foods. This information is not intended to replace advice given to you by your health care  provider. Make sure you discuss any questions you have with your health care provider. Document Released: 05/23/2005 Document Revised: 05/24/2016 Document Reviewed: 05/24/2016 Elsevier Interactive Patient Education  2018 [employer].   Monitor your blood pressure at home. Go to the ER if any CP, SOB, nausea, dizziness, severe HA, changes vision/speech  Goal BP:  For patients younger than 60: Goal BP < 140/90. For patients 60 and older: Goal BP < 150/90. For patients with diabetes: Goal BP < 140/90. Your most recent BP: BP: (!) 154/76   Take your medications faithfully as instructed. Maintain a healthy weight. Get at least 150 minutes of aerobic exercise per week. Minimize salt intake. Minimize alcohol intake  DASH Eating Plan DASH stands for "Dietary Approaches to Stop Hypertension." The DASH eating plan is a healthy eating plan that has been shown to reduce high blood pressure (hypertension). Additional health benefits may include reducing the risk of type 2 diabetes mellitus, heart disease, and stroke. The DASH eating plan may also help with weight loss. WHAT DO I NEED TO KNOW ABOUT THE DASH EATING PLAN? For the DASH eating plan, you will follow these general guidelines:  Choose foods with a percent daily value for sodium of less than 5% (as listed on the food label).  Use salt-free seasonings or herbs instead of table salt or sea salt.  Check with your health care provider or pharmacist before using salt substitutes.  Eat lower-sodium products, often labeled as "lower sodium" or "no salt added."  Eat fresh foods.  Eat more vegetables, fruits, and low-fat dairy products.  Choose whole grains. Look for the word "whole" as the first word in the ingredient list.  Choose fish and skinless chicken or [place] more often than red meat. Limit fish, poultry, and meat to 6 oz (170 g) each day.  Limit sweets, desserts, sugars, and sugary drinks.  Choose heart-healthy fats.  Limit  cheese to 1 oz (28 g) per day.  Eat more home-cooked food and less restaurant, buffet, and fast food.  Limit fried foods.  Cook foods using methods other than frying.  Limit canned vegetables. If you do use them, rinse them well to decrease the sodium.  When eating at a restaurant, ask that your food be prepared with less salt, or no salt if possible. WHAT FOODS CAN I EAT? Seek help from a dietitian for individual calorie needs. Grains Whole grain or whole wheat bread. [place] rice. Whole grain or whole wheat pasta. Quinoa, bulgur, and whole grain cereals. Low-sodium cereals. Corn or whole wheat flour tortillas. Whole grain cornbread. Whole grain crackers. Low-sodium crackers. Vegetables Fresh or frozen vegetables (raw, steamed, roasted, or grilled). Low-sodium or reduced-sodium tomato and vegetable juices. Low-sodium or reduced-sodium tomato sauce and paste. Low-sodium or reduced-sodium canned vegetables.  Fruits All fresh, canned (in natural juice), or frozen fruits. Meat and Other Protein Products Ground beef (85% or leaner), grass-fed beef, or beef trimmed of fat. Skinless chicken or [place]. Ground chicken or [place]. Pork trimmed of fat. All fish and seafood. Eggs. Dried beans, peas, or lentils. Unsalted nuts and seeds. Unsalted canned beans. Dairy Low-fat dairy products, such as skim or 1% milk, 2% or reduced-fat cheeses, low-fat ricotta or cottage cheese,  or plain low-fat yogurt. Low-sodium or reduced-sodium cheeses. Fats and Oils Tub margarines without trans fats. Light or reduced-fat mayonnaise and salad dressings (reduced sodium). Avocado. Safflower, olive, or canola oils. Natural peanut or almond butter. Other Unsalted popcorn and pretzels. The items listed above may not be a complete list of recommended foods or beverages. Contact your dietitian for more options. WHAT FOODS ARE NOT RECOMMENDED? Grains White bread. White pasta. White rice. Refined cornbread. Bagels and  croissants. Crackers that contain trans fat. Vegetables Creamed or fried vegetables. Vegetables in a cheese sauce. Regular canned vegetables. Regular canned tomato sauce and paste. Regular tomato and vegetable juices. Fruits Dried fruits. Canned fruit in light or heavy syrup. Fruit juice. Meat and Other Protein Products Fatty cuts of meat. Ribs, chicken wings, bacon, sausage, bologna, salami, chitterlings, fatback, hot dogs, bratwurst, and packaged luncheon meats. Salted nuts and seeds. Canned beans with salt. Dairy Whole or 2% milk, cream, half-and-half, and cream cheese. Whole-fat or sweetened yogurt. Full-fat cheeses or blue cheese. Nondairy creamers and whipped toppings. Processed cheese, cheese spreads, or cheese curds. Condiments Onion and garlic salt, seasoned salt, table salt, and sea salt. Canned and packaged gravies. Worcestershire sauce. Tartar sauce. Barbecue sauce. Teriyaki sauce. Soy sauce, including reduced sodium. Steak sauce. Fish sauce. Oyster sauce. Cocktail sauce. Horseradish. Ketchup and mustard. Meat flavorings and tenderizers. Bouillon cubes. Hot sauce. Tabasco sauce. Marinades. Taco seasonings. Relishes. Fats and Oils Butter, stick margarine, lard, shortening, ghee, and bacon fat. Coconut, palm kernel, or palm oils. Regular salad dressings. Other Pickles and olives. Salted popcorn and pretzels. The items listed above may not be a complete list of foods and beverages to avoid. Contact your dietitian for more information. WHERE CAN I FIND MORE INFORMATION? National Heart, Lung, and Blood Institute: travelstabloid.com Document Released: 05/12/2011 Document Revised: 10/07/2013 Document Reviewed: 03/27/2013 [employer] [facility] Patient Information 2015 [place], [place]. This information is not intended to replace advice given to you by your health care provider. Make sure you discuss any questions you have with your health care provider.

## 2017-06-09 LAB — HEPATIC FUNCTION PANEL
AG Ratio: 1.9 (calc) (ref 1.0–2.5)
ALBUMIN MSPROF: 4.4 g/dL (ref 3.6–5.1)
ALT: 19 U/L (ref 6–29)
AST: 16 U/L (ref 10–35)
Alkaline phosphatase (APISO): 92 U/L (ref 33–130)
Bilirubin, Direct: 0.1 mg/dL (ref 0.0–0.2)
GLOBULIN: 2.3 g/dL (ref 1.9–3.7)
Indirect Bilirubin: 0.7 mg/dL (calc) (ref 0.2–1.2)
TOTAL PROTEIN: 6.7 g/dL (ref 6.1–8.1)
Total Bilirubin: 0.8 mg/dL (ref 0.2–1.2)

## 2017-06-09 LAB — CBC WITH DIFFERENTIAL/PLATELET
BASOS PCT: 0.7 %
Basophils Absolute: 49 cells/uL (ref 0–200)
Eosinophils Absolute: 112 cells/uL (ref 15–500)
Eosinophils Relative: 1.6 %
HCT: 37.2 % (ref 35.0–45.0)
Hemoglobin: 12.7 g/dL (ref 11.7–15.5)
LYMPHS ABS: 1890 {cells}/uL (ref 850–3900)
MCH: 29 pg (ref 27.0–33.0)
MCHC: 34.1 g/dL (ref 32.0–36.0)
MCV: 84.9 fL (ref 80.0–100.0)
MPV: 10.6 fL (ref 7.5–12.5)
Monocytes Relative: 6.7 %
Neutro Abs: 4480 cells/uL (ref 1500–7800)
Neutrophils Relative %: 64 %
PLATELETS: 252 10*3/uL (ref 140–400)
RBC: 4.38 10*6/uL (ref 3.80–5.10)
RDW: 12.7 % (ref 11.0–15.0)
TOTAL LYMPHOCYTE: 27 %
WBC mixed population: 469 cells/uL (ref 200–950)
WBC: 7 10*3/uL (ref 3.8–10.8)

## 2017-06-09 LAB — BASIC METABOLIC PANEL WITH GFR
BUN: 14 mg/dL (ref 7–25)
CALCIUM: 9.9 mg/dL (ref 8.6–10.4)
CO2: 31 mmol/L (ref 20–32)
Chloride: 106 mmol/L (ref 98–110)
Creat: 0.65 mg/dL (ref 0.60–0.93)
GFR, EST AFRICAN AMERICAN: 99 mL/min/{1.73_m2} (ref >=60)
GFR, Est Non African American: 86 mL/min/{1.73_m2} (ref >=60)
Glucose, Bld: 90 mg/dL (ref 65–99)
Potassium: 4.7 mmol/L (ref 3.5–5.3)
Sodium: 142 mmol/L (ref 135–146)

## 2017-06-09 LAB — LIPID PANEL
CHOL/HDL RATIO: 3 (calc) (ref <5.0)
CHOLESTEROL: 130 mg/dL (ref <200)
HDL: 43 mg/dL — AB (ref >50)
LDL CHOLESTEROL (CALC): 63 mg/dL
Non-HDL Cholesterol (Calc): 87 mg/dL (calc) (ref <130)
TRIGLYCERIDES: 163 mg/dL — AB (ref <150)

## 2017-06-09 LAB — HEMOGLOBIN A1C
Hgb A1c MFr Bld: 5.8 % of total Hgb — ABNORMAL HIGH (ref <5.7)
Mean Plasma Glucose: 120 (calc)
eAG (mmol/L): 6.6 (calc)

## 2017-06-09 LAB — TSH: TSH: 1.61 m[IU]/L (ref 0.40–4.50)

## 2017-07-12 ENCOUNTER — Other Ambulatory Visit: Payer: Self-pay | Admitting: Internal Medicine

## 2017-09-07 ENCOUNTER — Ambulatory Visit: Payer: Self-pay | Admitting: Internal Medicine

## 2017-09-07 NOTE — Progress Notes (Signed)
RESCHEDULED

## 2017-09-21 ENCOUNTER — Ambulatory Visit (INDEPENDENT_AMBULATORY_CARE_PROVIDER_SITE_OTHER): Payer: Medicare Other | Admitting: Internal Medicine

## 2017-09-21 VITALS — BP 136/78 | HR 64 | Temp 97.2°F | Resp 16 | Ht 65.0 in | Wt 163.0 lb

## 2017-09-21 DIAGNOSIS — R7309 Other abnormal glucose: Secondary | ICD-10-CM | POA: Diagnosis not present

## 2017-09-21 DIAGNOSIS — K219 Gastro-esophageal reflux disease without esophagitis: Secondary | ICD-10-CM | POA: Diagnosis not present

## 2017-09-21 DIAGNOSIS — Z79899 Other long term (current) drug therapy: Secondary | ICD-10-CM

## 2017-09-21 DIAGNOSIS — E559 Vitamin D deficiency, unspecified: Secondary | ICD-10-CM | POA: Diagnosis not present

## 2017-09-21 DIAGNOSIS — E782 Mixed hyperlipidemia: Secondary | ICD-10-CM

## 2017-09-21 DIAGNOSIS — I10 Essential (primary) hypertension: Secondary | ICD-10-CM | POA: Diagnosis not present

## 2017-09-21 DIAGNOSIS — R7303 Prediabetes: Secondary | ICD-10-CM | POA: Diagnosis not present

## 2017-09-21 MED ORDER — PANTOPRAZOLE SODIUM 40 MG PO TBEC: DELAYED_RELEASE_TABLET | ORAL | 1 refills | Status: DC

## 2017-09-21 NOTE — Patient Instructions (Signed)
Recommend Adult Low Dose Aspirin or  coated  Aspirin 81 mg daily  To reduce risk of Colon Cancer 20 %,  Skin Cancer 26 % ,  Melanoma 46%  and  Pancreatic cancer 60% +++++++++++++++++++++++++ Vitamin D goal  is between 70-100.  Please make sure that you are taking your Vitamin D as directed.  It is very important as a natural anti-inflammatory  helping hair, skin, and nails, as well as reducing stroke and heart attack risk.  It helps your bones and helps with mood. It also decreases numerous cancer risks so please take it as directed.  Low Vit D is associated with a 200-300% higher risk for CANCER  and 200-300% higher risk for HEART   ATTACK  &  STROKE.   ...................................... It is also associated with higher death rate at younger ages,  autoimmune diseases like Rheumatoid arthritis, Lupus, Multiple Sclerosis.    Also many other serious conditions, like depression, Alzheimer's Dementia, infertility, muscle aches, fatigue, fibromyalgia - just to name a few. ++++++++++++++++++++ Recommend the book "The END of DIETING" by Dr Joel Fuhrman  & the book "The END of DIABETES " by Dr Joel Fuhrman At [employer] - get book & Audio CD's    Being diabetic has a  300% increased risk for heart attack, stroke, cancer, and alzheimer- type vascular dementia. It is very important that you work harder with diet by avoiding all foods that are white. Avoid white rice (R[place]genbach & wild rice is OK), white potatoes (sweetpotatoes in moderation is OK), White bread or wheat bread or anything made out of white flour like bagels, donuts, rolls, buns, biscuits, cakes, pastries, cookies, pizza crust, and pasta (made from white flour & egg whites) - vegetarian pasta or spinach or wheat pasta is OK. Multigrain breads like Arnold's or Pepperidge Farm, or multigrain sandwich thins or flatbreads.  Diet, exercise and weight loss can reverse and cure diabetes in the early stages.  Diet, exercise and weight loss is  very important in the control and prevention of complications of diabetes which affects every system in your body, ie. Brain - dementia/stroke, eyes - glaucoma/blindness, heart - heart attack/heart failure, kidneys - dialysis, stomach - gastric paralysis, intestines - malabsorption, nerves - severe painful neuritis, circulation - gangrene & loss of a leg(s), and finally cancer and Alzheimers.    I recommend avoid fried & greasy foods,  sweets/candy, white rice (Folse or wild rice or Quinoa is OK), white potatoes (sweet potatoes are OK) - anything made from white flour - bagels, doughnuts, rolls, buns, biscuits,white and wheat breads, pizza crust and traditional pasta made of white flour & egg white(vegetarian pasta or spinach or wheat pasta is OK).  Multi-grain bread is OK - like multi-grain flat bread or sandwich thins. Avoid alcohol in excess. Exercise is also important.    Eat all the vegetables you want - avoid meat, especially red meat and dairy - especially cheese.  Cheese is the most concentrated form of trans-fats which is the worst thing to clog up our arteries. Veggie cheese is OK which can be found in the fresh produce section at [employer] or [employer] or [employer]  +++++++++++++++++++++ DASH Eating Plan  DASH stands for "Dietary Approaches to Stop Hypertension."   The DASH eating plan is a healthy eating plan that has been shown to reduce high blood pressure (hypertension). Additional health benefits may include reducing the risk of type 2 diabetes mellitus, heart disease, and stroke. The DASH eating plan may also   help with weight loss. WHAT DO I NEED TO KNOW ABOUT THE DASH EATING PLAN? For the DASH eating plan, you will follow these general guidelines:  Choose foods with a percent daily value for sodium of less than 5% (as listed on the food label).  Use salt-free seasonings or herbs instead of table salt or sea salt.  Check with your health care provider or pharmacist before  using salt substitutes.  Eat lower-sodium products, often labeled as "lower sodium" or "no salt added."  Eat fresh foods.  Eat more vegetables, fruits, and low-fat dairy products.  Choose whole grains. Look for the word "whole" as the first word in the ingredient list.  Choose fish   Limit sweets, desserts, sugars, and sugary drinks.  Choose heart-healthy fats.  Eat veggie cheese   Eat more home-cooked food and less restaurant, buffet, and fast food.  Limit fried foods.  Cook foods using methods other than frying.  Limit canned vegetables. If you do use them, rinse them well to decrease the sodium.  When eating at a restaurant, ask that your food be prepared with less salt, or no salt if possible.                      WHAT FOODS CAN I EAT? Read Dr Joel Fuhrman's books on The End of Dieting & The End of Diabetes  Grains Whole grain or whole wheat bread. Pinegar rice. Whole grain or whole wheat pasta. Quinoa, bulgur, and whole grain cereals. Low-sodium cereals. Corn or whole wheat flour tortillas. Whole grain cornbread. Whole grain crackers. Low-sodium crackers.  Vegetables Fresh or frozen vegetables (raw, steamed, roasted, or grilled). Low-sodium or reduced-sodium tomato and vegetable juices. Low-sodium or reduced-sodium tomato sauce and paste. Low-sodium or reduced-sodium canned vegetables.   Fruits All fresh, canned (in natural juice), or frozen fruits.  Protein Products  All fish and seafood.  Dried beans, peas, or lentils. Unsalted nuts and seeds. Unsalted canned beans.  Dairy Low-fat dairy products, such as skim or 1% milk, 2% or reduced-fat cheeses, low-fat ricotta or cottage cheese, or plain low-fat yogurt. Low-sodium or reduced-sodium cheeses.  Fats and Oils Tub margarines without trans fats. Light or reduced-fat mayonnaise and salad dressings (reduced sodium). Avocado. Safflower, olive, or canola oils. Natural peanut or almond butter.  Other Unsalted popcorn  and pretzels. The items listed above may not be a complete list of recommended foods or beverages. Contact your dietitian for more options.  +++++++++++++++  WHAT FOODS ARE NOT RECOMMENDED? Grains/ White flour or wheat flour White bread. White pasta. White rice. Refined cornbread. Bagels and croissants. Crackers that contain trans fat.  Vegetables  Creamed or fried vegetables. Vegetables in a . Regular canned vegetables. Regular canned tomato sauce and paste. Regular tomato and vegetable juices.  Fruits Dried fruits. Canned fruit in light or heavy syrup. Fruit juice.  Meat and Other Protein Products Meat in general - RED meat & White meat.  Fatty cuts of meat. Ribs, chicken wings, all processed meats as bacon, sausage, bologna, salami, fatback, hot dogs, bratwurst and packaged luncheon meats.  Dairy Whole or 2% milk, cream, half-and-half, and cream cheese. Whole-fat or sweetened yogurt. Full-fat cheeses or blue cheese. Non-dairy creamers and whipped toppings. Processed cheese, cheese spreads, or cheese curds.  Condiments Onion and garlic salt, seasoned salt, table salt, and sea salt. Canned and packaged gravies. Worcestershire sauce. Tartar sauce. Barbecue sauce. Teriyaki sauce. Soy sauce, including reduced sodium. Steak sauce. Fish sauce. Oyster sauce. Cocktail   sauce. Horseradish. Ketchup and mustard. Meat flavorings and tenderizers. Bouillon cubes. Hot sauce. Tabasco sauce. Marinades. Taco seasonings. Relishes.  Fats and Oils Butter, stick margarine, lard, shortening and bacon fat. Coconut, palm kernel, or palm oils. Regular salad dressings.  Pickles and olives. Salted popcorn and pretzels.  The items listed above may not be a complete list of foods and beverages to avoid.   

## 2017-09-21 NOTE — Progress Notes (Signed)
This very nice 77 y.o. WWF presents for 6 month follow up with HTN, HLD, Pre-Diabetes and Vitamin D Deficiency.      Patient is treated for HTN & BP has been controlled at home. Today's BP was initially elevated & rechecked at goal - 136/78. In 2011, she had a negative/Normal Cardiolite. Patient has had no complaints of any cardiac type chest pain, palpitations, dyspnea / orthopnea / PND, dizziness, claudication, or dependent edema.     In 2013, patient underwent resection of a pituitary tumor and has done well since.     Hyperlipidemia is controlled with diet & meds. Patient denies myalgias or other med SE's. Last Lipids were at goal: Lab Results  Component Value Date   CHOL 124 09/21/2017   HDL 47 (L) 09/21/2017   LDLCALC 60 09/21/2017   TRIG 91 09/21/2017   CHOLHDL 2.6 09/21/2017      Also, the patient has history of PreDiabetes A1c 5.9%/Ins 90/2011 and A1c 6.1%in 20123 & 2014)  and she has had no symptoms of reactive hypoglycemia, diabetic polys, paresthesias or visual blurring.  Last A1c was near goal: Lab Results  Component Value Date   HGBA1C 5.9 (H) 09/21/2017      Further, the patient also has history of Vitamin D Deficiency ("31"/2008)  and supplements   Vitamin D without any suspected side-effects. Last vitamin D was at goal;: Lab Results  Component Value Date   VD25OH 56 09/21/2017   Current Outpatient Medications on File Prior to Visit  Medication Sig  . amLODipine (NORVASC) 2.5 MG tablet Take 1 tablet (2.5 mg total) by mouth daily.  Marland Kitchen aspirin EC 81 MG tablet Take 81 mg by mouth daily.  Marland Kitchen atorvastatin (LIPITOR) 80 MG tablet TAKE ONE TABLET BY MOUTH EVERY DAY FOR CHOLESTEROL  . Cholecalciferol 1000 units tablet Take 1,500 Units by mouth daily.  Marland Kitchen lisinopril (PRINIVIL,ZESTRIL) 10 MG tablet TAKE ONE TABLET BY MOUTH EVERY DAY  . meloxicam (MOBIC) 15 MG tablet Take 1 tablet (15 mg total) by mouth daily.  Marland Kitchen PARoxetine (PAXIL) 20 MG tablet TAKE ONE TABLET BY MOUTH EVERY  DAY  . ranitidine (ZANTAC) 75 MG tablet Take 2 tablets (150 mg total) by mouth 2 (two) times daily.   No current facility-administered medications on file prior to visit.    Allergies  Allergen Reactions  . Aspirin Other (See Comments)    High Doses stomach trouble  . Codeine Nausea And Vomiting  . Fenofibrate Cough  . Hydrochlorothiazide Other (See Comments)    Fatigue  . Metoclopramide Other (See Comments)    Sedation   PMHx:   Past Medical History:  Diagnosis Date  . Arthritis   . Colon cancer ([place]) 1990s, 2014  . Colon polyps   . Depression   . Dysrhythmia 04/2012   SVT - RATE 145 POST OP CRANIOTOMY SURGERY --TX'D AND RESOLVED--PT STATES NO FURTHER PROBLEM THAT SHE IS AWARE OF -SHE HAS NOT HAD TO SEE CARDIOLOGIST  . GERD (gastroesophageal reflux disease)   . Headache(784.0)    hx migraines  . Hypertension    pt states she has not taken any b/p medication since her brain surgery in oct 3013--took herself off the medication  . Pituitary mass ([place]) NOV 2013   MASS FOUND AFTER PT EXPERIENCED BILATERAL BLURRED / HAZY VISION -ESP LEFT EYE.  S/P CRANIOTOMY AND EYESIGHT RETURNED TO NORMAL--PT STILL HAS A LOT OF TENDERNESS RIGHT SIDE OF NOSE --THE CRANIOTOMY WAS DONE BY TRANSNASAL APPROACH  .  Pneumonia yrs ago   hx of pneumonia  . PONV (postoperative nausea and vomiting)    PT STATES PLEASE NOTE SHE HAS TENDERNESS RIGHT SIDE OF NOSE - SINCE TRANSNASAL CRANIOTOMY 04/2012  . Prediabetes   . Vitamin D deficiency    Immunization History  Administered Date(s) Administered  . Influenza Split 04/19/2012  . Influenza, High Dose Seasonal PF 04/17/2013, 03/12/2014, 03/03/2017  . Pneumococcal Conjugate-13 09/30/2014  . Pneumococcal Polysaccharide-23 04/19/2012  . Td 07/20/2005, 12/01/2015   Past Surgical History:  Procedure Laterality Date  . BACK SURGERY     lower  . CATARACT EXTRACTION  June/july 2013   bilateral eyes  . COLON RESECTION  1988  . COLON SURGERY  1990's  .  COLONOSCOPY  09/20/2011   Procedure: COLONOSCOPY;  Surgeon: Missy Sabins, MD;  Location: [facility];  Service: Endoscopy;  Laterality: N/A;  . COLONOSCOPY  12/15/2011   Procedure: COLONOSCOPY;  Surgeon: Missy Sabins, MD;  Location: [facility];  Service: Endoscopy;  Laterality: N/A;  . COLONOSCOPY  01/26/2012   Procedure: COLONOSCOPY;  Surgeon: Cleotis Nipper, MD;  Location: [facility];  Service: Endoscopy;  Laterality: N/A;  . CRANIOTOMY  04/18/2012   Procedure: CRANIOTOMY HYPOPHYSECTOMY TRANSNASAL APPROACH;  Surgeon: Ophelia Charter, MD;  Location: [facility];  Service: Neurosurgery;  Laterality: Bilateral;  Transphenoidal resection of Pituitary tumor.   . CYSTOSCOPY WITH URETEROSCOPY AND STENT PLACEMENT Bilateral 10/01/2015   Procedure: CYSTOSCOPY, retrograde WITH URETEROSCOPY AND STENT PLACEMENT, ;  Surgeon: Raynelle Bring, MD;  Location: [facility];  Service: Urology;  Laterality: Bilateral;  . CYSTOSCOPY WITH URETEROSCOPY AND STENT PLACEMENT Right 10/19/2015   Procedure: CYSTOSCOPY,  RIGHT URETEROSCOPY, HOLMIUM LASER RIGHT URETERAL STONE, RIGHT URETERAL STENT PLACEMENT, LEFT URETEROSCOPY, INSERTION LEFT URETERAL STENT;  Surgeon: Raynelle Bring, MD;  Location: [facility];  Service: Urology;  Laterality: Right;  . EYE SURGERY     cataract bil  . HOLMIUM LASER APPLICATION Right 8/75/6433   Procedure: HOLMIUM LASER  ;  Surgeon: Raynelle Bring, MD;  Location: [facility];  Service: Urology;  Laterality: Right;  . HOT HEMOSTASIS  09/20/2011   Procedure: HOT HEMOSTASIS (ARGON PLASMA COAGULATION/BICAP);  Surgeon: Missy Sabins, MD;  Location: [facility];  Service: Endoscopy;  Laterality: N/A;  . LAPAROSCOPIC RIGHT HEMI COLECTOMY  06/27/2012   Procedure: LAPAROSCOPIC RIGHT HEMI COLECTOMY;  Surgeon: Pedro Earls, MD;  Location: [facility];  Service: General;  Laterality: N/A;  Laparoscopic Assited Right Hemicolectomy  . TRANSNASAL APPROACH  04/18/2012   Procedure: TRANSNASAL APPROACH;  Surgeon: Rozetta Nunnery, MD;  Location: [facility];  Service: ENT;  Laterality: Bilateral;  Transnasal approach  . TUMOR REMOVAL     Left leg   FHx:    Reviewed / unchanged  SHx:    Reviewed / unchanged  Systems Review:  Constitutional: Denies fever, chills, wt changes, headaches, insomnia, fatigue, night sweats, change in appetite. Eyes: Denies redness, blurred vision, diplopia, discharge, itchy, watery eyes.  ENT: Denies discharge, congestion, post nasal drip, epistaxis, sore throat, earache, hearing loss, dental pain, tinnitus, vertigo, sinus pain, snoring.  CV: Denies chest pain, palpitations, irregular heartbeat, syncope, dyspnea, diaphoresis, orthopnea, PND, claudication or edema. Respiratory: denies cough, dyspnea, DOE, pleurisy, hoarseness, laryngitis, wheezing.  Gastrointestinal: Denies dysphagia, odynophagia, heartburn, reflux, water brash, abdominal pain or cramps, nausea, vomiting, bloating, diarrhea, constipation, hematemesis, melena, hematochezia  or hemorrhoids. Genitourinary: Denies dysuria, frequency, urgency, nocturia, hesitancy, discharge, hematuria or flank pain. Musculoskeletal: Denies arthralgias, myalgias, stiffness, jt. swelling, pain,  limping or strain/sprain.  Skin: Denies pruritus, rash, hives, warts, acne, eczema or change in skin lesion(s). Neuro: No weakness, tremor, incoordination, spasms, paresthesia or pain. Psychiatric: Denies confusion, memory loss or sensory loss. Endo: Denies change in weight, skin or hair change.  Heme/Lymph: No excessive bleeding, bruising or enlarged lymph nodes.  Physical Exam  BP 136/78   Pulse 64   Temp (!) 97.2 F (36.2 C)   Resp 16   Ht 5\' 5"  (1.651 m)   Wt 163 lb (73.9 kg)   BMI 27.12 kg/m   Appears  well nourished, well groomed  and in no distress.  Eyes: PERRLA, EOMs, conjunctiva no swelling or erythema. Sinuses: No frontal/maxillary tenderness ENT/Mouth: EAC's clear, TM's nl w/o erythema, bulging. Nares clear w/o erythema,  swelling, exudates. Oropharynx clear without erythema or exudates. Oral hygiene is good. Tongue normal, non obstructing. Hearing intact.  Neck: Supple. Thyroid not palpable. Car 2+/2+ without bruits, nodes or JVD. Chest: Respirations nl with BS clear & equal w/o rales, rhonchi, wheezing or stridor.  Cor: Heart sounds normal w/ regular rate and rhythm without sig. murmurs, gallops, clicks or rubs. Peripheral pulses normal and equal  without edema.  Abdomen: Soft & bowel sounds normal. Non-tender w/o guarding, rebound, hernias, masses or organomegaly.  Lymphatics: Unremarkable.  Musculoskeletal: Full ROM all peripheral extremities, joint stability, 5/5 strength and normal gait.  Skin: Warm, dry without exposed rashes, lesions or ecchymosis apparent.  Neuro: Cranial nerves intact, reflexes equal bilaterally. Sensory-motor testing grossly intact. Tendon reflexes grossly intact.  Pysch: Alert & oriented x 3.  Insight and judgement nl & appropriate. No ideations.  Assessment and Plan:  1. Hypertension  - Continue medication, monitor blood pressure at home.  - Continue DASH diet.  Reminder to go to the ER if any CP,  SOB, nausea, dizziness, severe HA, changes vision/speech.  - CBC with Differential/Platelet - COMPLETE METABOLIC PANEL WITH GFR - Magnesium - TSH  2. Hyperlipidemia, mixed  - Continue diet/meds, exercise,& lifestyle modifications.  - Continue monitor periodic cholesterol/liver & renal functions   - Lipid panel - TSH  3. Abnormal glucose  - Continue diet, exercise, lifestyle modifications.  - Monitor appropriate labs.  - Hemoglobin A1c - Insulin, random  4. Vitamin D deficiency  - Continue supplementation.  - VITAMIN D 25 Hydroxyl  5. Prediabetes  - Hemoglobin A1c - Insulin, random  6. Gastroesophageal reflux disease  - CBC with Differential/Platelet  - pantoprazole (PROTONIX) 40 MG tablet; Take 1 tablet daily at suppertime for Acid Reflux  Dispense: 90  tablet; Refill: 1  7. Medication management  - CBC with Differential/Platelet - COMPLETE METABOLIC PANEL WITH GFR - Magnesium - Lipid panel - TSH - Hemoglobin A1c - Insulin, random - VITAMIN D 25 Hydroxyl                   Discussed  regular exercise, BP monitoring, weight control to achieve/maintain BMI less than 25 and discussed med and SE's. Recommended labs to assess and monitor clinical status with further disposition pending results of labs. Over 30 minutes of exam, counseling, chart review was performed.

## 2017-09-22 LAB — COMPLETE METABOLIC PANEL WITH GFR
AG RATIO: 2.2 (calc) (ref 1.0–2.5)
ALBUMIN MSPROF: 4.4 g/dL (ref 3.6–5.1)
ALT: 16 U/L (ref 6–29)
AST: 17 U/L (ref 10–35)
Alkaline phosphatase (APISO): 83 U/L (ref 33–130)
BUN: 13 mg/dL (ref 7–25)
CALCIUM: 9.5 mg/dL (ref 8.6–10.4)
CO2: 30 mmol/L (ref 20–32)
Chloride: 105 mmol/L (ref 98–110)
Creat: 0.65 mg/dL (ref 0.60–0.93)
GFR, EST NON AFRICAN AMERICAN: 86 mL/min/{1.73_m2} (ref >=60)
GFR, Est African American: 99 mL/min/{1.73_m2} (ref >=60)
GLOBULIN: 2 g/dL (ref 1.9–3.7)
Glucose, Bld: 94 mg/dL (ref 65–99)
POTASSIUM: 4.1 mmol/L (ref 3.5–5.3)
Sodium: 141 mmol/L (ref 135–146)
Total Bilirubin: 0.6 mg/dL (ref 0.2–1.2)
Total Protein: 6.4 g/dL (ref 6.1–8.1)

## 2017-09-22 LAB — CBC WITH DIFFERENTIAL/PLATELET
BASOS ABS: 37 {cells}/uL (ref 0–200)
Basophils Relative: 0.6 %
EOS PCT: 2.4 %
Eosinophils Absolute: 149 cells/uL (ref 15–500)
HEMATOCRIT: 35.6 % (ref 35.0–45.0)
HEMOGLOBIN: 12.2 g/dL (ref 11.7–15.5)
LYMPHS ABS: 1662 {cells}/uL (ref 850–3900)
MCH: 29.4 pg (ref 27.0–33.0)
MCHC: 34.3 g/dL (ref 32.0–36.0)
MCV: 85.8 fL (ref 80.0–100.0)
MPV: 10.8 fL (ref 7.5–12.5)
Monocytes Relative: 6 %
NEUTROS ABS: 3980 {cells}/uL (ref 1500–7800)
NEUTROS PCT: 64.2 %
Platelets: 247 10*3/uL (ref 140–400)
RBC: 4.15 10*6/uL (ref 3.80–5.10)
RDW: 12.8 % (ref 11.0–15.0)
Total Lymphocyte: 26.8 %
WBC: 6.2 10*3/uL (ref 3.8–10.8)
WBCMIX: 372 {cells}/uL (ref 200–950)

## 2017-09-22 LAB — HEMOGLOBIN A1C
EAG (MMOL/L): 6.8 (calc)
HEMOGLOBIN A1C: 5.9 %{Hb} — AB (ref <5.7)
Mean Plasma Glucose: 123 (calc)

## 2017-09-22 LAB — LIPID PANEL
CHOL/HDL RATIO: 2.6 (calc) (ref <5.0)
Cholesterol: 124 mg/dL (ref <200)
HDL: 47 mg/dL — AB (ref >50)
LDL Cholesterol (Calc): 60 mg/dL (calc)
NON-HDL CHOLESTEROL (CALC): 77 mg/dL (ref <130)
Triglycerides: 91 mg/dL (ref <150)

## 2017-09-22 LAB — INSULIN, RANDOM: Insulin: 3 u[IU]/mL (ref 2.0–19.6)

## 2017-09-22 LAB — TSH: TSH: 1.57 mIU/L (ref 0.40–4.50)

## 2017-09-22 LAB — MAGNESIUM: Magnesium: 1.9 mg/dL (ref 1.5–2.5)

## 2017-09-22 LAB — VITAMIN D 25 HYDROXY (VIT D DEFICIENCY, FRACTURES): VIT D 25 HYDROXY: 56 ng/mL (ref 30–100)

## 2017-09-24 ENCOUNTER — Encounter: Payer: Self-pay | Admitting: Internal Medicine

## 2017-10-19 ENCOUNTER — Other Ambulatory Visit: Payer: Self-pay | Admitting: Adult Health

## 2017-11-21 ENCOUNTER — Other Ambulatory Visit: Payer: Self-pay | Admitting: Adult Health

## 2017-12-07 IMAGING — DX DG ABDOMEN 1V
1 series · 1 of 1 positions shown · non-contrast
Comparison: January 26, 2012.

CLINICAL DATA: Lower abdominal pain for 2 weeks. Status post
ureteral stent placement.

EXAM:
ABDOMEN - 1 VIEW

[abdomen kub]
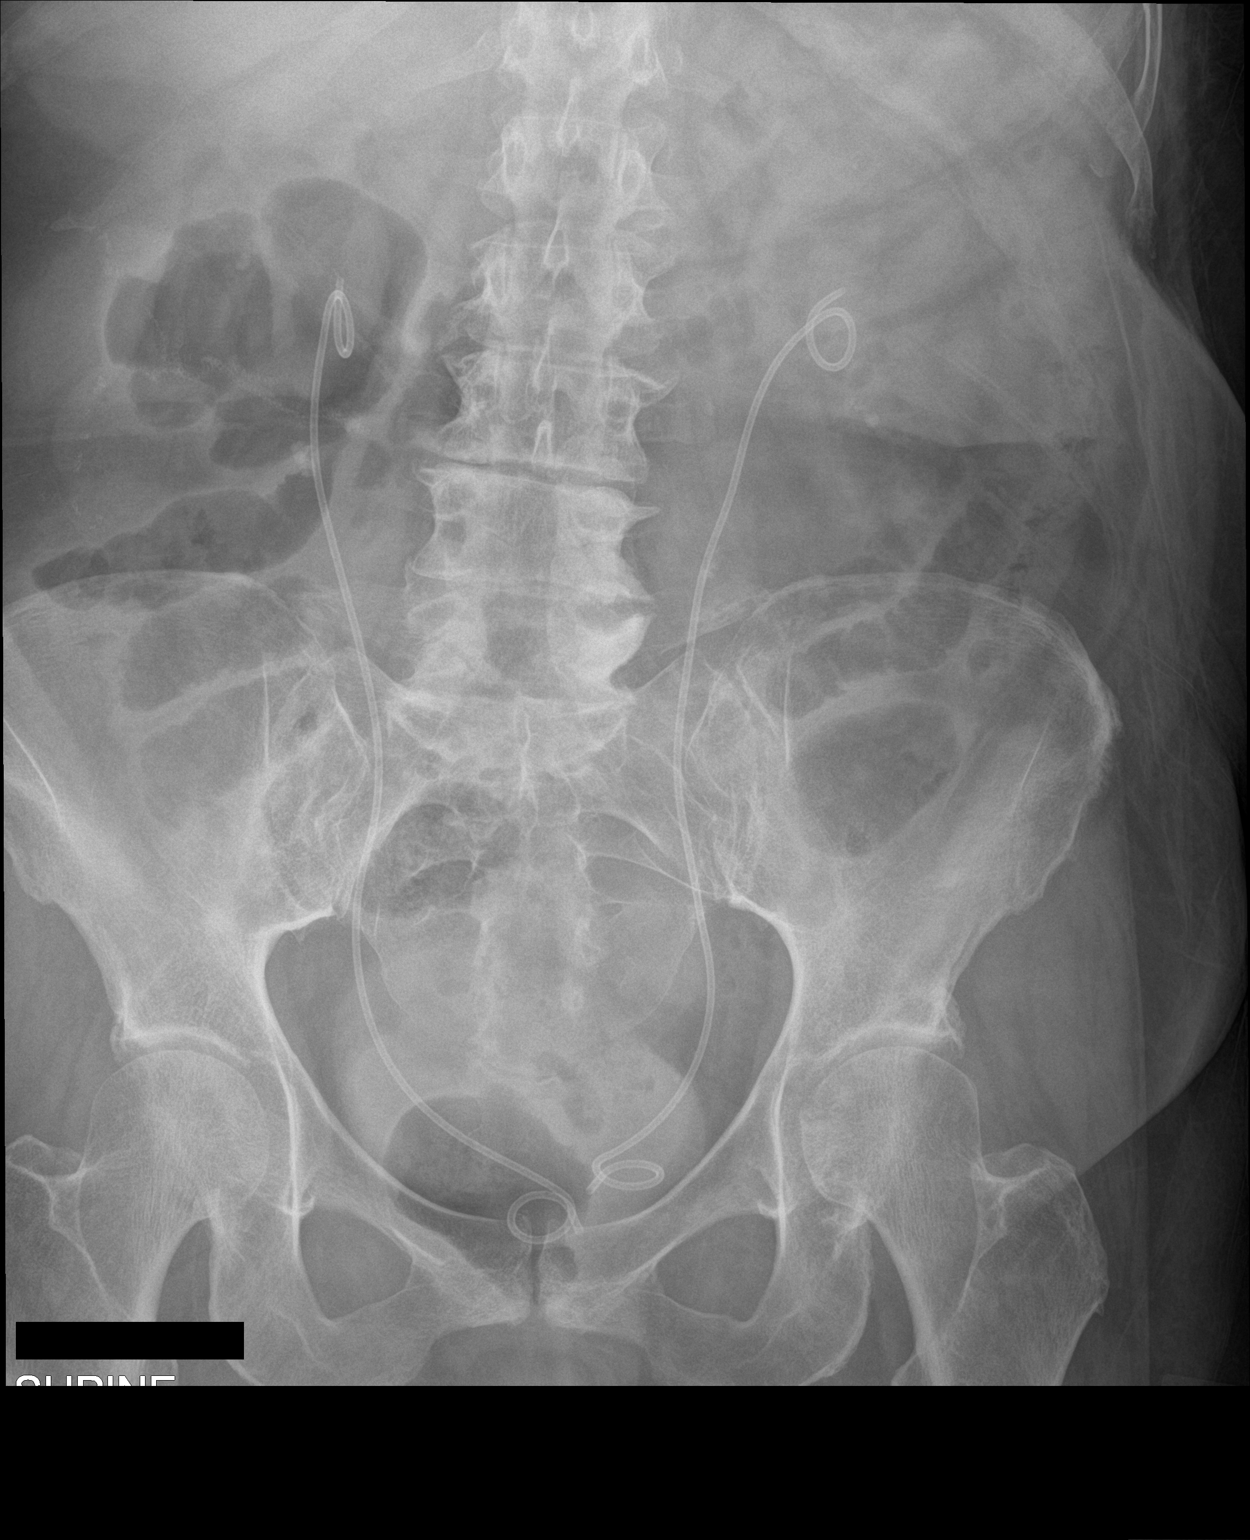

[1 of 1 positions shown; findings below may reference images not displayed]

FINDINGS: The bowel gas pattern is normal. Bilateral nephrolithiasis is noted.
Postsurgical changes seen in right abdomen. Bilateral ureteral
stents are grossly good position.
IMPRESSION: Bilateral ureteral stents are in grossly good position. Bilateral
nephrolithiasis is noted.

## 2017-12-22 IMAGING — CR DG CHEST 2V
2 series · 2 of 2 positions shown · non-contrast
Comparison: Chest x-ray of August 18, 2014

CLINICAL DATA: Preoperative exam prior to cystoscopy and right
ureteroscopy and stent placement next week; history of hypertension,
nonsmoker

EXAM:
CHEST  2 VIEW

[w chest pa]
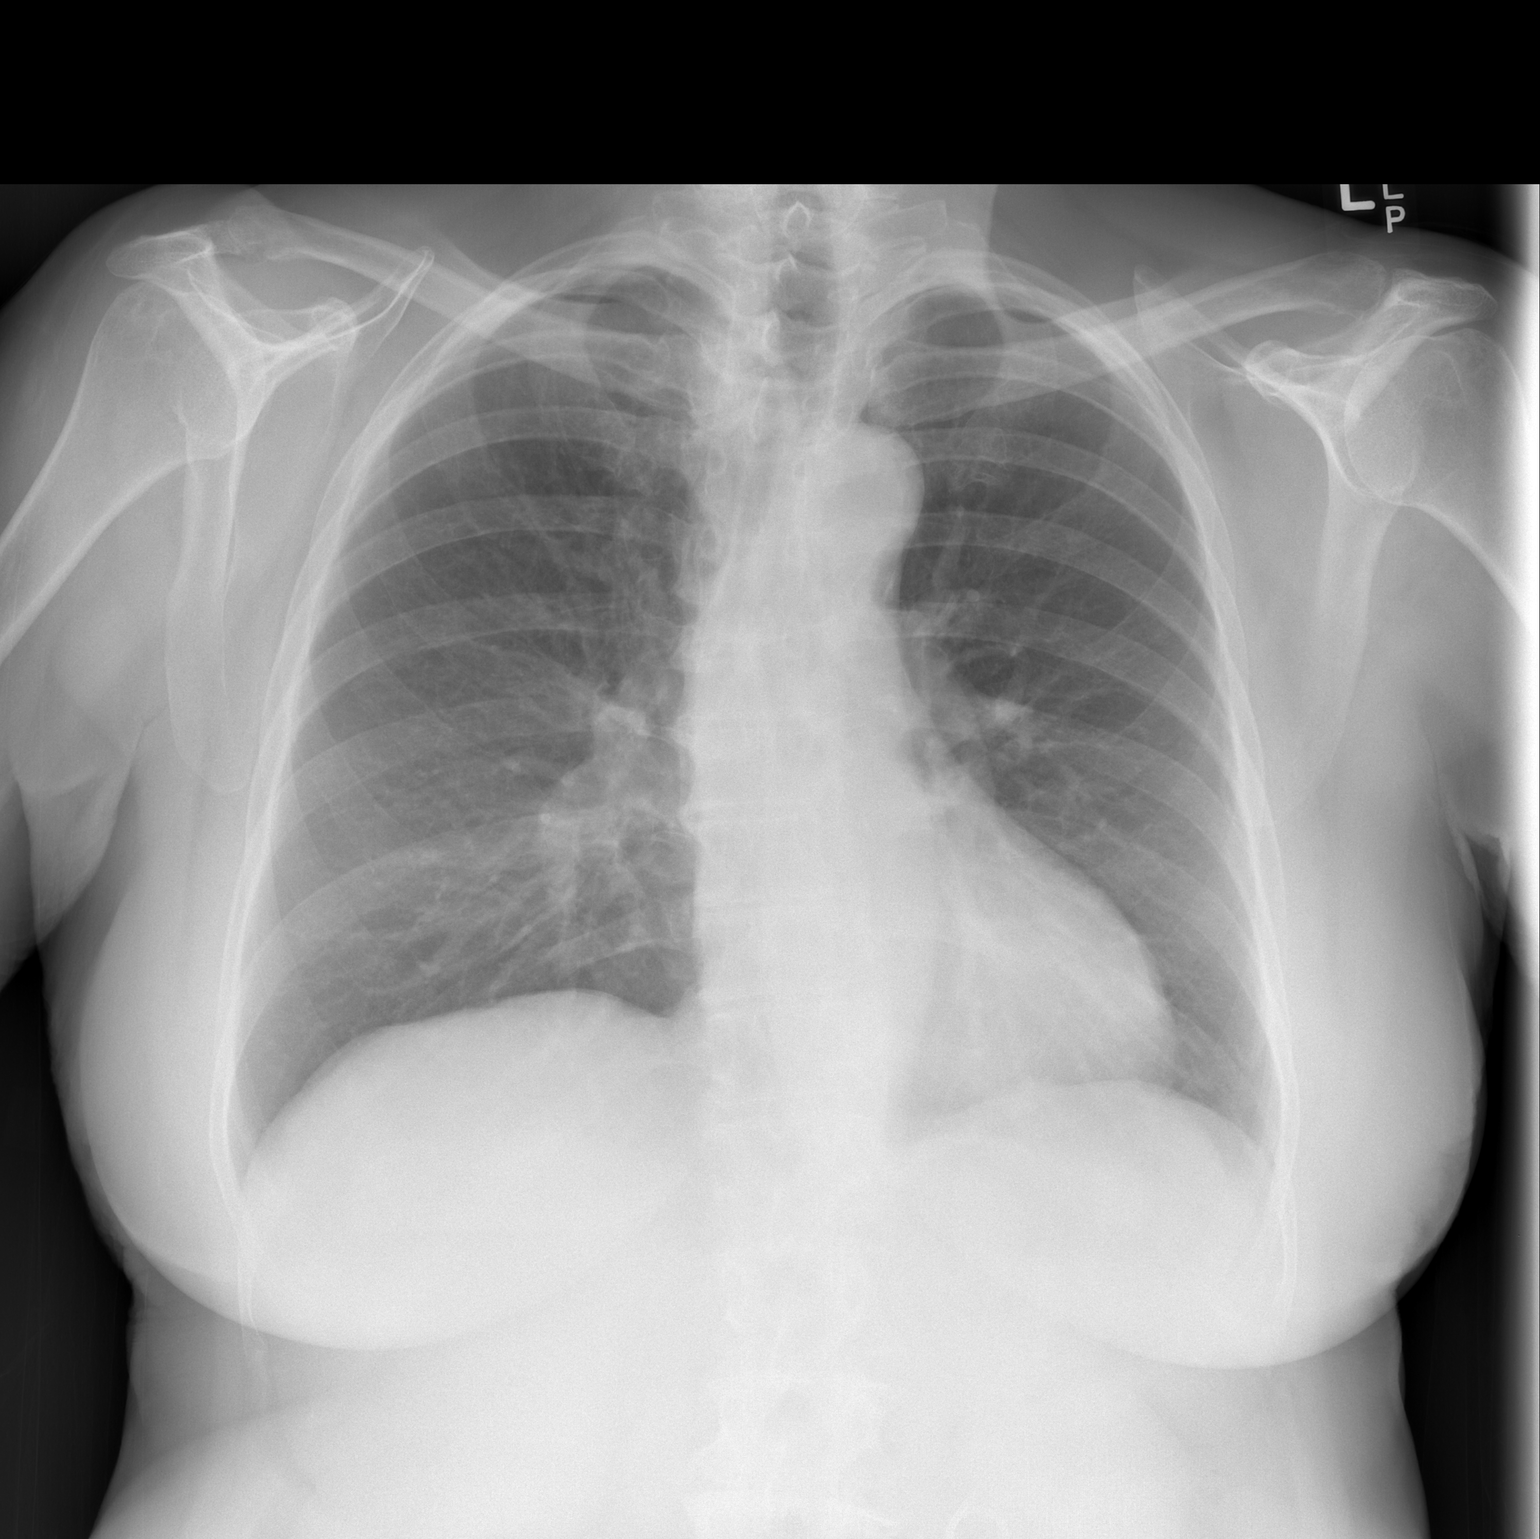

[w chest lat]
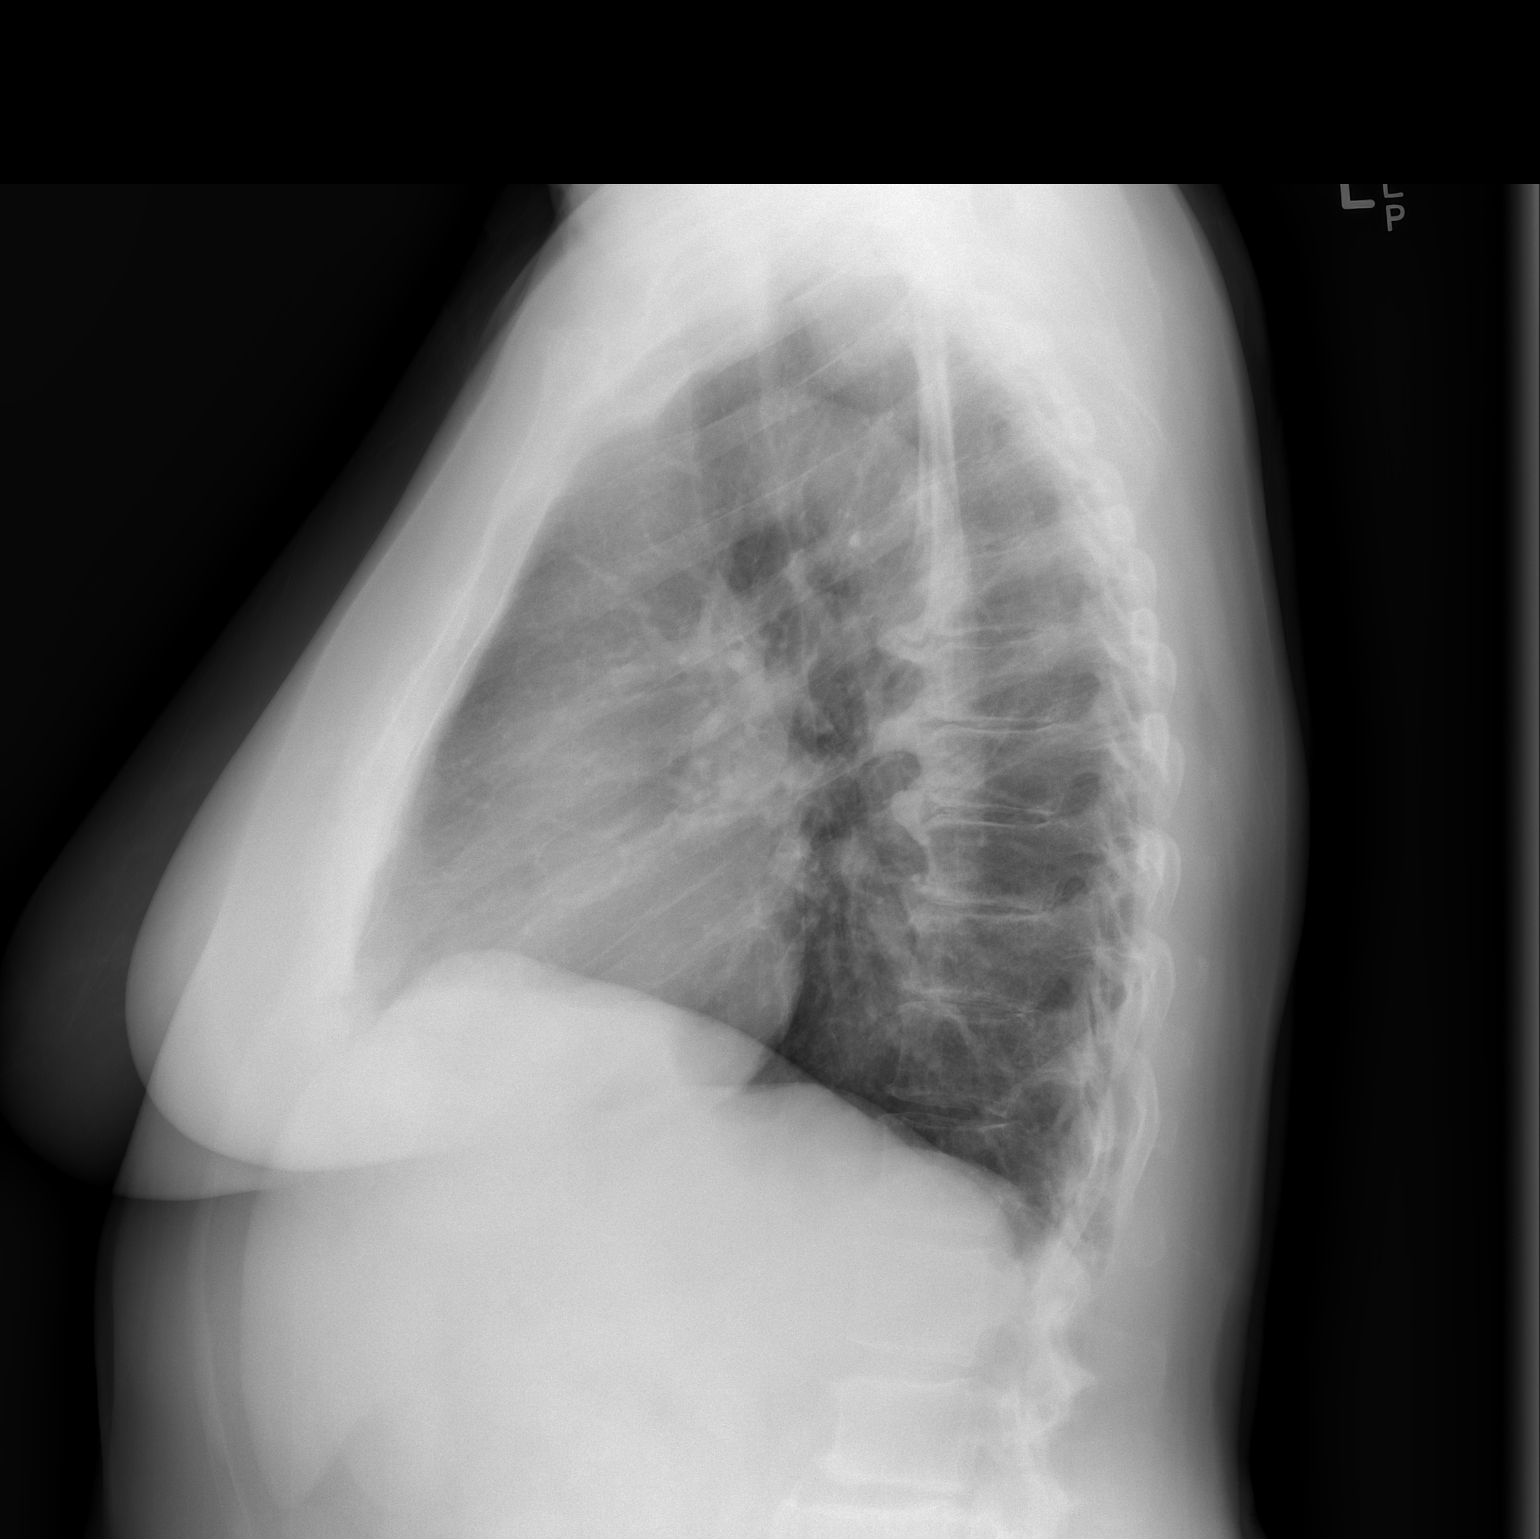

[2 of 2 positions shown; findings below may reference images not displayed]

FINDINGS: The lungs are adequately inflated and clear. The heart and pulmonary
vascularity are normal. The mediastinum is normal in width. There is
gentle dextrocurvature centered in the mid thoracic spine. There is
multilevel degenerative disc space narrowing with endplate
osteophyte formation.
IMPRESSION: There is no active cardiopulmonary disease. There are stable
degenerative changes at multiple thoracic disc levels.

## 2017-12-22 NOTE — Progress Notes (Deleted)
MEDICARE ANNUAL WELLNESS VISIT AND FOLLOW UP Assessment:    Encounter for Medicare annual wellness exam  SVT (supraventricular tachycardia) ([place]) Not currently symptomatic; followed by cardiology  Hypertension Continue medication Monitor blood pressure at home; call if consistently over 130/80 Continue DASH diet.   Reminder to go to the ER if any CP, SOB, nausea, dizziness, severe HA, changes vision/speech, left arm numbness and tingling and jaw pain.  Gastroesophageal reflux disease, esophagitis presence not specified Well managed on current medications Discussed diet, avoiding triggers and other lifestyle changes  Pituitary tumor (excised 2013) S/p excision, doing well   Vitamin D deficiency Continue supplementation Check vitamin D level  Prediabetes Discussed disease and risks Discussed diet/exercise, weight management  A1C  Overweight (BMI 25.0-29.9) Long discussion about weight loss, diet, and exercise Recommended diet heavy in fruits and veggies and low in animal meats, cheeses, and dairy products, appropriate calorie intake Discussed appropriate weight for height  Follow up at next visit  Medication management CBC, CMP/GFR, magnesium  Major depression in remission ([place]) Continue medications  Lifestyle discussed: diet/exerise, sleep hygiene, stress management, hydration  Hyperlipidemia Continue medications Continue low cholesterol diet and exercise.  Check lipid panel.   History of colon cancer, stage II  Adenomatous polyps-recurrent Overdue for follow up colonoscopy ***    Over 30 minutes of exam, counseling, chart review, and critical decision making was performed  Future Appointments  Date Time Provider [place]  12/25/2017  3:00 PM Liane Comber, NP [facility] None  03/29/2018  3:00 PM Unk Pinto, MD [facility] None     Plan:   During the course of the visit the patient was educated and counseled about appropriate screening  and preventive services including:    Pneumococcal vaccine   Influenza vaccine  Prevnar 13  Td vaccine  Screening electrocardiogram  Colorectal cancer screening  Diabetes screening  Glaucoma screening  Nutrition counseling    Subjective:  Kristen [name] is a 77 y.o. female who presents for Medicare Annual Wellness Visit and 3 month follow up for HTN, hyperlipidemia, prediabetes, and vitamin D Def. In 2013, patient underwent resection of a pituitary tumor and has done well since.  BMI is There is no height or weight on file to calculate BMI., she {HAS HAS [id number]} been working on diet and exercise. Wt Readings from Last 3 Encounters:  09/21/17 163 lb (73.9 kg)  06/08/17 159 lb (72.1 kg)  03/02/17 163 lb 6.4 oz (74.1 kg)   Her blood pressure has been controlled at home, today their BP is   She does not workout. She denies chest pain, shortness of breath, dizziness.    She is on cholesterol medication and denies myalgias. Her cholesterol is at goal. The cholesterol last visit was:   Lab Results  Component Value Date   CHOL 124 09/21/2017   HDL 47 (L) 09/21/2017   LDLCALC 60 09/21/2017   TRIG 91 09/21/2017   CHOLHDL 2.6 09/21/2017   Historically Kristen [name] has had well controlled prediabetes secondary to diet and exercise Lab Results  Component Value Date   HGBA1C 5.9 (H) 09/21/2017   Last GFR Lab Results  Component Value Date   GFRNONAA 86 09/21/2017   Patient is on Vitamin D supplement.   Lab Results  Component Value Date   VD25OH 56 09/21/2017      Medication Review: Current Outpatient Medications on File Prior to Visit  Medication Sig Dispense Refill  . amLODipine (NORVASC) 2.5 MG tablet Take 1 tablet (2.5 mg total)  by mouth daily. 90 tablet 1  . aspirin EC 81 MG tablet Take 81 mg by mouth daily.    Marland Kitchen atorvastatin (LIPITOR) 80 MG tablet TAKE ONE TABLET BY MOUTH EVERY DAY FOR CHOLESTEROL 90 tablet 0  . Cholecalciferol 1000 units tablet Take 1,500  Units by mouth daily.    Marland Kitchen lisinopril (PRINIVIL,ZESTRIL) 10 MG tablet TAKE ONE TABLET BY MOUTH EVERY DAY 90 tablet 0  . meloxicam (MOBIC) 15 MG tablet Take 1 tablet (15 mg total) by mouth daily. 90 tablet 0  . pantoprazole (PROTONIX) 40 MG tablet Take 1 tablet daily at suppertime for Acid Reflux 90 tablet 1  . PARoxetine (PAXIL) 20 MG tablet TAKE ONE TABLET BY MOUTH EVERY DAY 90 tablet 0  . ranitidine (ZANTAC) 75 MG tablet Take 2 tablets (150 mg total) by mouth 2 (two) times daily. 180 tablet 1   No current facility-administered medications on file prior to visit.     Allergies: Allergies  Allergen Reactions  . Aspirin Other (See Comments)    High Doses stomach trouble  . Codeine Nausea And Vomiting  . Fenofibrate Cough  . Hydrochlorothiazide Other (See Comments)    Fatigue  . Metoclopramide Other (See Comments)    Sedation    Current Problems (verified) has Adenomatous polyps-recurrent; SVT (supraventricular tachycardia) ([place]); GERD (gastroesophageal reflux disease); Major depression in remission ([place]); Overweight (BMI 25.0-29.9); History of colon cancer, stage II; Vitamin D deficiency; Prediabetes; Hyperlipidemia; Medication management; Pituitary tumor (excised 2013); and Hypertension on their problem list.  Screening Tests Immunization History  Administered Date(s) Administered  . Influenza Split 04/19/2012  . Influenza, High Dose Seasonal PF 04/17/2013, 03/12/2014, 03/03/2017  . Pneumococcal Conjugate-13 09/30/2014  . Pneumococcal Polysaccharide-23 04/19/2012  . Td 07/20/2005, 12/01/2015    Preventative care: DEXA: Declined Mammogram:  2015 Last colonoscopy: 2015 - overdue *** was supposed to have 2 year follow up   Prior vaccinations: TD or Tdap: 2017  Influenza: 2018  Pneumococcal: 2013 Prevnar13: 2016   Names of Other Physician/Practitioners you currently use: 1. [facility] here for primary care 2. Dr. Gershon Crane, eye doctor,  last visit 2017 3. Does not see, dentist, last visit 2017  Patient Care Team: Unk Pinto, MD as PCP - General (Internal Medicine)  Surgical: She  has a past surgical history that includes Colon resection (1988); Colonoscopy (09/20/2011); Hot hemostasis (09/20/2011); Cataract extraction (June/july 2013); Colonoscopy (12/15/2011); Colonoscopy (01/26/2012); Eye surgery; Tumor removal; Craniotomy (04/18/2012); Transnasal approach (04/18/2012); Laparoscopic right hemi colectomy (06/27/2012); Cystoscopy with ureteroscopy and stent placement (Bilateral, 10/01/2015); Back surgery; Colon surgery (1990's); Cystoscopy with ureteroscopy and stent placement (Right, 10/19/2015); and Holmium laser application (Right, 12/03/5282). Family Her family history includes Brain cancer in her cousin; Breast cancer (age of onset: 55) in her sister; Cancer in her maternal aunt; Colon cancer in her cousin; Colon cancer (age of onset: 92) in her other; Colon cancer (age of onset: 1) in her sister; Colon polyps in her other, son, and son; Dementia in her sister; Heart disease in her father and mother; Liver cancer in her maternal uncle. Social history  She reports that she has never smoked. She has never used smokeless tobacco. She reports that she does not drink alcohol or use drugs.  MEDICARE WELLNESS OBJECTIVES: Physical activity:   Cardiac risk factors:   Depression/mood screen:   Depression screen [facility] 2/9 09/24/2017  Decreased Interest 0  Down, Depressed, Hopeless 0  PHQ - 2 Score 0    ADLs:  In your  present state of health, do you have any difficulty performing the following activities: 09/24/2017 03/04/2017  Hearing? N N  Vision? N N  Difficulty concentrating or making decisions? N N  Walking or climbing stairs? N N  Dressing or bathing? N N  Doing errands, shopping? N N  Some recent data might be hidden     Cognitive Testing  Alert? Yes  Normal Appearance?Yes  Oriented to person? Yes  Place? Yes   Time?  Yes  Recall of three objects?  Yes  Can perform simple calculations? Yes  Displays appropriate judgment?Yes  Can read the correct time from a watch face?Yes  EOL planning:     Objective:   There were no vitals filed for this visit. There is no height or weight on file to calculate BMI.  General appearance: alert, no distress, WD/WN, female HEENT: normocephalic, sclerae anicteric, TMs pearly, nares patent, no discharge or erythema, pharynx normal Oral cavity: MMM, no lesions Neck: supple, no lymphadenopathy, no thyromegaly, no masses Heart: RRR, normal S1, S2, no murmurs Lungs: CTA bilaterally, no wheezes, rhonchi, or rales Abdomen: +bs, soft, non tender, non distended, no masses, no hepatomegaly, no splenomegaly Musculoskeletal: Cast present on the right ankle, nontender, no swelling, no obvious deformity Extremities: no edema, no cyanosis, no clubbing Pulses: 2+ symmetric, upper and lower extremities, normal cap refill Neurological: alert, oriented x 3, CN2-12 intact, strength normal upper extremities and lower extremities, sensation normal throughout, DTRs 2+ throughout, no cerebellar signs, gait normal Psychiatric: normal affect, behavior normal, pleasant   Medicare Attestation I have personally reviewed: The patient's medical and social history Their use of alcohol, tobacco or illicit drugs Their current medications and supplements The patient's functional ability including ADLs,fall risks, home safety risks, cognitive, and hearing and visual impairment Diet and physical activities Evidence for depression or mood disorders  The patient's weight, height, BMI, and visual acuity have been recorded in the chart.  I have made referrals, counseling, and provided education to the patient based on review of the above and I have provided the patient with a written personalized care plan for preventive services.     Izora Ribas, NP   12/22/2017

## 2017-12-25 ENCOUNTER — Ambulatory Visit: Payer: Self-pay | Admitting: Adult Health

## 2018-01-05 NOTE — Progress Notes (Signed)
MEDICARE ANNUAL WELLNESS VISIT AND FOLLOW UP Assessment:    Encounter for Medicare annual wellness exam Patient agrees to schedule mammogram for fall, declines dexa  SVT (supraventricular tachycardia) ([place]) Not currently symptomatic; followed by cardiology  Hypertension Continue medication Monitor blood pressure at home; call if consistently over 130/80 Continue DASH diet.   Reminder to go to the ER if any CP, SOB, nausea, dizziness, severe HA, changes vision/speech, left arm numbness and tingling and jaw pain.  Gastroesophageal reflux disease, esophagitis presence not specified Well managed on current medications Discussed diet, avoiding triggers and other lifestyle changes  Pituitary tumor (excised 2013) S/p excision, doing well   Vitamin D deficiency Continue supplementation Check vitamin D level  Prediabetes Discussed disease and risks Discussed diet/exercise, weight management  A1C  Overweight (BMI 25.0-29.9) Long discussion about weight loss, diet, and exercise Recommended diet heavy in fruits and veggies and low in animal meats, cheeses, and dairy products, appropriate calorie intake Discussed appropriate weight for height  Follow up at next visit  Medication management CBC, CMP/GFR, magnesium  Major depression in remission ([place]) Continue medications  Lifestyle discussed: diet/exerise, sleep hygiene, stress management, hydration  Hyperlipidemia Continue medications Continue low cholesterol diet and exercise.  Check lipid panel.   History of colon cancer, stage II Overdue for follow up colonoscopy, will refer back to Dr. Amedeo Plenty  Left foot pain ? Morton's neuralgia - advised stop wearing stockings, good foot support, avoid narrow/tight shoes Mobic daily, can try gabapentin 100-300 mg at night for nerve pain; if not resolving can refer   Over 30 minutes of exam, counseling, chart review, and critical decision making was performed  Future Appointments   Date Time Provider [place]  03/29/2018  3:00 PM Unk Pinto, MD [facility] None     Plan:   During the course of the visit the patient was educated and counseled about appropriate screening and preventive services including:    Pneumococcal vaccine   Influenza vaccine  Prevnar 13  Td vaccine  Screening electrocardiogram  Colorectal cancer screening  Diabetes screening  Glaucoma screening  Nutrition counseling    Subjective:  Kristen [name] is a 77 y.o. female who presents for Medicare Annual Wellness Visit and 3 month follow up for HTN, hyperlipidemia, prediabetes, and vitamin D Def. In 2013, patient underwent resection of a pituitary tumor and has done well since. She has a history of colon cancer over 10 years ago, resolved with resection, has been on follow up colonoscopies by Dr. Amedeo Plenty but she is overdue for follow up.   Today she reports intermittent burning pain of the left 2nd distal toe; she reports having this 2-3 days a week, notes particularly worse after wearing stockings on Sunday. Hasn't tried anything for this.   BMI is Body mass index is 26.96 kg/m., she has been working on diet, mainly practices moderation, eating out of her neighbor's vegetable garden during the summer.  Wt Readings from Last 3 Encounters:  01/08/18 162 lb (73.5 kg)  09/21/17 163 lb (73.9 kg)  06/08/17 159 lb (72.1 kg)   Her blood pressure has been controlled at home, today their BP is BP: 122/64 She does not workout. She denies chest pain, shortness of breath, dizziness.    She is on cholesterol medication and denies myalgias. Her cholesterol is at goal. The cholesterol last visit was:   Lab Results  Component Value Date   CHOL 124 09/21/2017   HDL 47 (L) 09/21/2017   LDLCALC 60 09/21/2017  TRIG 91 09/21/2017   CHOLHDL 2.6 09/21/2017   Historically Kristen [name] has had well controlled prediabetes secondary to diet and exercise Lab Results  Component Value Date    HGBA1C 5.9 (H) 09/21/2017   Last GFR Lab Results  Component Value Date   GFRNONAA 86 09/21/2017   Patient is on Vitamin D supplement.   Lab Results  Component Value Date   VD25OH 56 09/21/2017      Medication Review: Current Outpatient Medications on File Prior to Visit  Medication Sig Dispense Refill  . amLODipine (NORVASC) 2.5 MG tablet Take 1 tablet (2.5 mg total) by mouth daily. 90 tablet 1  . aspirin EC 81 MG tablet Take 81 mg by mouth daily.    Marland Kitchen atorvastatin (LIPITOR) 80 MG tablet TAKE ONE TABLET BY MOUTH EVERY DAY FOR CHOLESTEROL 90 tablet 0  . Cholecalciferol 1000 units tablet Take 1,500 Units by mouth daily.    Marland Kitchen lisinopril (PRINIVIL,ZESTRIL) 10 MG tablet TAKE ONE TABLET BY MOUTH EVERY DAY 90 tablet 0  . meloxicam (MOBIC) 15 MG tablet Take 1 tablet (15 mg total) by mouth daily. 90 tablet 0  . pantoprazole (PROTONIX) 40 MG tablet Take 1 tablet daily at suppertime for Acid Reflux 90 tablet 1  . PARoxetine (PAXIL) 20 MG tablet TAKE ONE TABLET BY MOUTH EVERY DAY 90 tablet 0  . ranitidine (ZANTAC) 75 MG tablet Take 2 tablets (150 mg total) by mouth 2 (two) times daily. 180 tablet 1   No current facility-administered medications on file prior to visit.     Allergies: Allergies  Allergen Reactions  . Aspirin Other (See Comments)    High Doses stomach trouble  . Codeine Nausea And Vomiting  . Fenofibrate Cough  . Hydrochlorothiazide Other (See Comments)    Fatigue  . Metoclopramide Other (See Comments)    Sedation    Current Problems (verified) has SVT (supraventricular tachycardia) ([place]); GERD (gastroesophageal reflux disease); Major depression in remission ([place]); Overweight (BMI 25.0-29.9); History of colon cancer, stage II; Vitamin D deficiency; Prediabetes; Hyperlipidemia; Medication management; Pituitary tumor (excised 2013); and Hypertension on their problem list.  Screening Tests Immunization History  Administered Date(s) Administered  . Influenza Split  04/19/2012  . Influenza, High Dose Seasonal PF 04/17/2013, 03/12/2014, 03/03/2017  . Pneumococcal Conjugate-13 09/30/2014  . Pneumococcal Polysaccharide-23 04/19/2012  . Td 07/20/2005, 12/01/2015    Preventative care: DEXA: Declined Mammogram:  2015 - declines at this time Last colonoscopy: 2015 - overdue - was supposed to have 2 year follow up   Prior vaccinations: TD or Tdap: 2017  Influenza: 2018  Pneumococcal: 2013 Prevnar13: 2016   Names of Other Physician/Practitioners you currently use: 1. [facility] here for primary care 2. Dr. Gershon Crane, eye doctor, last visit 2017 3. Does not see, dentist, last visit 2017  Patient Care Team: Unk Pinto, MD as PCP - General (Internal Medicine)  Surgical: She  has a past surgical history that includes Colon resection (1988); Colonoscopy (09/20/2011); Hot hemostasis (09/20/2011); Cataract extraction (June/july 2013); Colonoscopy (12/15/2011); Colonoscopy (01/26/2012); Eye surgery; Tumor removal; Craniotomy (04/18/2012); Transnasal approach (04/18/2012); Laparoscopic right hemi colectomy (06/27/2012); Cystoscopy with ureteroscopy and stent placement (Bilateral, 10/01/2015); Back surgery; Colon surgery (1990's); Cystoscopy with ureteroscopy and stent placement (Right, 10/19/2015); and Holmium laser application (Right, 3/82/5053). Family Her family history includes Brain cancer in her cousin; Breast cancer (age of onset: 60) in her sister; Cancer in her maternal aunt; Colon cancer in her cousin; Colon cancer (age of onset: 42) in  her other; Colon cancer (age of onset: 73) in her sister; Colon polyps in her other, son, and son; Dementia in her sister; Heart disease in her father and mother; Liver cancer in her maternal uncle. Social history  She reports that she has never smoked. She has never used smokeless tobacco. She reports that she does not drink alcohol or use drugs.  MEDICARE WELLNESS  OBJECTIVES: Physical activity: Current Exercise Habits: Home exercise routine, Type of exercise: Other - see comments(intensive yardwork), Time (Minutes): 20, Frequency (Times/Week): 7, Weekly Exercise (Minutes/Week): 140, Intensity: Mild, Exercise limited by: None identified Cardiac risk factors: Cardiac Risk Factors include: advanced age (>25men, >79 women);hypertension;dyslipidemia Depression/mood screen:   Depression screen [facility] 2/9 01/08/2018  Decreased Interest 0  Down, Depressed, Hopeless 0  PHQ - 2 Score 0    ADLs:  In your present state of health, do you have any difficulty performing the following activities: 01/08/2018 09/24/2017  Hearing? N N  Vision? N N  Difficulty concentrating or making decisions? N N  Walking or climbing stairs? N N  Dressing or bathing? N N  Doing errands, shopping? N N  Some recent data might be hidden     Cognitive Testing  Alert? Yes  Normal Appearance?Yes  Oriented to person? Yes  Place? Yes   Time? Yes  Recall of three objects?  Yes  Can perform simple calculations? Yes  Displays appropriate judgment?Yes  Can read the correct time from a watch face?Yes  EOL planning: Does Patient Have a Medical Advance Directive?: No Would patient like information on creating a medical advance directive?: Yes (MAU/Ambulatory/Procedural Areas - Information given)   Objective:   Today's Vitals   01/08/18 1551  BP: 122/64  Pulse: 69  Temp: (!) 97.5 F (36.4 C)  SpO2: 97%  Weight: 162 lb (73.5 kg)  Height: 5\' 5"  (1.651 m)  PainSc: 10-Worst pain ever  PainLoc: Toe   Body mass index is 26.96 kg/m.  General appearance: alert, no distress, WD/WN, female HEENT: normocephalic, sclerae anicteric, TMs pearly, nares patent, no discharge or erythema, pharynx normal Oral cavity: MMM, no lesions Neck: supple, no lymphadenopathy, no thyromegaly, no masses Heart: RRR, normal S1, S2, no murmurs Lungs: CTA bilaterally, no wheezes, rhonchi, or rales Abdomen: +bs,  soft, non tender, non distended, no masses, no hepatomegaly, no splenomegaly Musculoskeletal: Cast present on the right ankle, nontender, no swelling, no obvious deformity Extremities: no edema, no cyanosis, no clubbing Pulses: 2+ symmetric, upper and lower extremities, normal cap refill Neurological: alert, oriented x 3, CN2-12 intact, strength normal upper extremities and lower extremities, sensation normal throughout, DTRs 2+ throughout, no cerebellar signs, gait normal Psychiatric: normal affect, behavior normal, pleasant   Medicare Attestation I have personally reviewed: The patient's medical and social history Their use of alcohol, tobacco or illicit drugs Their current medications and supplements The patient's functional ability including ADLs,fall risks, home safety risks, cognitive, and hearing and visual impairment Diet and physical activities Evidence for depression or mood disorders  The patient's weight, height, BMI, and visual acuity have been recorded in the chart.  I have made referrals, counseling, and provided education to the patient based on review of the above and I have provided the patient with a written personalized care plan for preventive services.     Izora Ribas, NP   01/08/2018

## 2018-01-08 ENCOUNTER — Ambulatory Visit (INDEPENDENT_AMBULATORY_CARE_PROVIDER_SITE_OTHER): Payer: Medicare Other | Admitting: Adult Health

## 2018-01-08 ENCOUNTER — Encounter: Payer: Self-pay | Admitting: Adult Health

## 2018-01-08 VITALS — BP 122/64 | HR 69 | Temp 97.5°F | Ht 65.0 in | Wt 162.0 lb

## 2018-01-08 DIAGNOSIS — E782 Mixed hyperlipidemia: Secondary | ICD-10-CM | POA: Diagnosis not present

## 2018-01-08 DIAGNOSIS — I10 Essential (primary) hypertension: Secondary | ICD-10-CM | POA: Diagnosis not present

## 2018-01-08 DIAGNOSIS — D497 Neoplasm of unspecified behavior of endocrine glands and other parts of nervous system: Secondary | ICD-10-CM | POA: Diagnosis not present

## 2018-01-08 DIAGNOSIS — E663 Overweight: Secondary | ICD-10-CM

## 2018-01-08 DIAGNOSIS — R7303 Prediabetes: Secondary | ICD-10-CM

## 2018-01-08 DIAGNOSIS — Z85038 Personal history of other malignant neoplasm of large intestine: Secondary | ICD-10-CM

## 2018-01-08 DIAGNOSIS — Z0001 Encounter for general adult medical examination with abnormal findings: Secondary | ICD-10-CM

## 2018-01-08 DIAGNOSIS — E559 Vitamin D deficiency, unspecified: Secondary | ICD-10-CM | POA: Diagnosis not present

## 2018-01-08 DIAGNOSIS — F325 Major depressive disorder, single episode, in full remission: Secondary | ICD-10-CM

## 2018-01-08 DIAGNOSIS — Z0000 Encounter for general adult medical examination without abnormal findings: Secondary | ICD-10-CM

## 2018-01-08 DIAGNOSIS — K219 Gastro-esophageal reflux disease without esophagitis: Secondary | ICD-10-CM | POA: Diagnosis not present

## 2018-01-08 DIAGNOSIS — Z79899 Other long term (current) drug therapy: Secondary | ICD-10-CM | POA: Diagnosis not present

## 2018-01-08 DIAGNOSIS — R6889 Other general symptoms and signs: Secondary | ICD-10-CM | POA: Diagnosis not present

## 2018-01-08 DIAGNOSIS — I471 Supraventricular tachycardia: Secondary | ICD-10-CM

## 2018-01-08 MED ORDER — GABAPENTIN 100 MG PO CAPS: ORAL_CAPSULE | ORAL | 1 refills | Status: DC

## 2018-01-08 MED ORDER — MELOXICAM 15 MG PO TABS: 15.0000 mg | ORAL_TABLET | Freq: Every day | ORAL | 0 refills | Status: DC

## 2018-01-08 NOTE — Patient Instructions (Addendum)
Please schedule mammogram  Please try taking mobic regularly for a few weeks, avoid stockings/tight shoes, wear insoles to cushion feet  Can try gabapentin 1-3 tabs at night for burning nerve pain   [facility]  7 a.m.-6:30 p.m., Monday 7 a.m.-5 p.m., Tuesday-Friday Schedule an appointment by calling [phone].  [facility] Schedule an appointment by calling [phone].    Morton Neuralgia Morton neuralgia is a type of foot pain in the area closest to your toes. This area is sometimes called the ball of your foot. Morton neuralgia occurs when a branch of a nerve in your foot (digital nerve) becomes compressed. When this happens over a long period of time, the nerve can thicken (neuroma) and cause pain. This usually occurs between the third and fourth toe. Morton neuralgia can come and go but may get worse over time. What are the causes? Your digital nerve can become compressed and stretched at a point where it passes under a thick band of tissue that connects your toes (intermetatarsal ligament). Morton neuralgia can be caused by mild repetitive damage in this area. This type of damage can result from:  Activities such as running or jumping.  Wearing shoes that are too tight.  What increases the risk? You may be at risk for Morton neuralgia if you:  Are female.  Wear high heels.  Wear shoes that are narrow or tight.  Participate in activities that stretch your toes. These include: ? Running. ? [place]. ? Long-distance walking.  What are the signs or symptoms? The first symptom of Morton neuralgia is pain that spreads from the ball of your foot to your toes. It may feel like you are walking on a marble. Pain usually gets worse with walking and goes away at night. Other symptoms may include numbness and cramping of your toes. How is this diagnosed? Your health care provider will do a physical exam. When doing the exam, your health care  provider may:  Squeeze your foot just behind your toe.  Ask you to move your toes to check for pain.  You may also have tests on your foot to confirm the diagnosis. These may include:  An X-ray.  An MRI.  How is this treated? Treatment for Morton neuralgia may be as simple as changing the kind of shoes you wear. Other treatments may include:  Wearing a supportive pad (orthosis) under the front of your foot. This lifts your toe bones and takes pressure off the nerve.  Getting injections of numbing medicine and anti-inflammatory medicine (steroid) in the nerve.  Having surgery to remove part of the thickened nerve.  Follow these instructions at home:  Take medicine only as directed by your health care provider.  Wear soft-soled shoes with a wide toe area.  Stop activities that may be causing pain.  Elevate your foot when resting.  Massage your foot.  Apply ice to the injured area: ? Put ice in a plastic bag. ? Place a towel between your skin and the bag. ? Leave the ice on for 20 minutes, 2-3 times a day.  Keep all follow-up visits as directed by your health care provider. This is important. Contact a health care provider if:  Home care instructions are not helping you get better.  Your symptoms change or get worse. This information is not intended to replace advice given to you by your health care provider. Make sure you discuss any questions you have with your health care provider. Document  Released: 08/29/2000 Document Revised: 10/29/2015 Document Reviewed: 07/24/2013 Elsevier Interactive Patient Education  [employer].

## 2018-01-09 LAB — CBC WITH DIFFERENTIAL/PLATELET
BASOS PCT: 0.6 %
Basophils Absolute: 40 cells/uL (ref 0–200)
EOS ABS: 99 {cells}/uL (ref 15–500)
EOS PCT: 1.5 %
HCT: 35.9 % (ref 35.0–45.0)
Hemoglobin: 12.3 g/dL (ref 11.7–15.5)
Lymphs Abs: 1591 cells/uL (ref 850–3900)
MCH: 29.3 pg (ref 27.0–33.0)
MCHC: 34.3 g/dL (ref 32.0–36.0)
MCV: 85.5 fL (ref 80.0–100.0)
MONOS PCT: 6.4 %
MPV: 11.2 fL (ref 7.5–12.5)
Neutro Abs: 4448 cells/uL (ref 1500–7800)
Neutrophils Relative %: 67.4 %
PLATELETS: 237 10*3/uL (ref 140–400)
RBC: 4.2 10*6/uL (ref 3.80–5.10)
RDW: 12.7 % (ref 11.0–15.0)
Total Lymphocyte: 24.1 %
WBC mixed population: 422 cells/uL (ref 200–950)
WBC: 6.6 10*3/uL (ref 3.8–10.8)

## 2018-01-09 LAB — LIPID PANEL
CHOL/HDL RATIO: 2.4 (calc) (ref <5.0)
Cholesterol: 121 mg/dL (ref <200)
HDL: 50 mg/dL — AB (ref >50)
LDL Cholesterol (Calc): 54 mg/dL (calc)
NON-HDL CHOLESTEROL (CALC): 71 mg/dL (ref <130)
TRIGLYCERIDES: 86 mg/dL (ref <150)

## 2018-01-09 LAB — COMPLETE METABOLIC PANEL WITH GFR
AG RATIO: 2.1 (calc) (ref 1.0–2.5)
ALBUMIN MSPROF: 4.7 g/dL (ref 3.6–5.1)
ALT: 18 U/L (ref 6–29)
AST: 19 U/L (ref 10–35)
Alkaline phosphatase (APISO): 73 U/L (ref 33–130)
BILIRUBIN TOTAL: 0.8 mg/dL (ref 0.2–1.2)
BUN: 17 mg/dL (ref 7–25)
CALCIUM: 9.9 mg/dL (ref 8.6–10.4)
CO2: 30 mmol/L (ref 20–32)
Chloride: 107 mmol/L (ref 98–110)
Creat: 0.77 mg/dL (ref 0.60–0.93)
GFR, EST AFRICAN AMERICAN: 86 mL/min/{1.73_m2} (ref >=60)
GFR, EST NON AFRICAN AMERICAN: 74 mL/min/{1.73_m2} (ref >=60)
GLUCOSE: 91 mg/dL (ref 65–99)
Globulin: 2.2 g/dL (calc) (ref 1.9–3.7)
Potassium: 5.1 mmol/L (ref 3.5–5.3)
Sodium: 143 mmol/L (ref 135–146)
TOTAL PROTEIN: 6.9 g/dL (ref 6.1–8.1)

## 2018-01-09 LAB — HEMOGLOBIN A1C
HEMOGLOBIN A1C: 5.9 %{Hb} — AB (ref <5.7)
Mean Plasma Glucose: 123 (calc)
eAG (mmol/L): 6.8 (calc)

## 2018-01-09 LAB — TSH: TSH: 1.53 m[IU]/L (ref 0.40–4.50)

## 2018-01-09 LAB — MAGNESIUM: Magnesium: 2.1 mg/dL (ref 1.5–2.5)

## 2018-03-19 ENCOUNTER — Other Ambulatory Visit: Payer: Self-pay | Admitting: Adult Health

## 2018-03-19 DIAGNOSIS — I10 Essential (primary) hypertension: Secondary | ICD-10-CM

## 2018-03-20 DIAGNOSIS — D352 Benign neoplasm of pituitary gland: Secondary | ICD-10-CM | POA: Diagnosis not present

## 2018-03-21 ENCOUNTER — Other Ambulatory Visit: Payer: Self-pay | Admitting: *Deleted

## 2018-03-21 DIAGNOSIS — I10 Essential (primary) hypertension: Secondary | ICD-10-CM

## 2018-03-21 MED ORDER — AMLODIPINE BESYLATE 2.5 MG PO TABS: 2.5000 mg | ORAL_TABLET | Freq: Every day | ORAL | 1 refills | Status: DC

## 2018-03-21 MED ORDER — MELOXICAM 15 MG PO TABS: 15.0000 mg | ORAL_TABLET | Freq: Every day | ORAL | 0 refills | Status: DC

## 2018-03-21 MED ORDER — LISINOPRIL 10 MG PO TABS: 10.0000 mg | ORAL_TABLET | Freq: Every day | ORAL | 0 refills | Status: DC

## 2018-03-21 MED ORDER — ATORVASTATIN CALCIUM 80 MG PO TABS: 80.0000 mg | ORAL_TABLET | Freq: Every day | ORAL | 0 refills | Status: DC

## 2018-03-21 MED ORDER — PAROXETINE HCL 20 MG PO TABS: 20.0000 mg | ORAL_TABLET | Freq: Every day | ORAL | 0 refills | Status: DC

## 2018-03-27 ENCOUNTER — Other Ambulatory Visit: Payer: Self-pay | Admitting: *Deleted

## 2018-03-27 DIAGNOSIS — K219 Gastro-esophageal reflux disease without esophagitis: Secondary | ICD-10-CM

## 2018-03-27 MED ORDER — MELOXICAM 15 MG PO TABS: 15.0000 mg | ORAL_TABLET | Freq: Every day | ORAL | 0 refills | Status: DC

## 2018-03-27 MED ORDER — GABAPENTIN 100 MG PO CAPS: ORAL_CAPSULE | ORAL | 1 refills | Status: DC

## 2018-03-27 MED ORDER — LISINOPRIL 10 MG PO TABS: 10.0000 mg | ORAL_TABLET | Freq: Every day | ORAL | 0 refills | Status: DC

## 2018-03-27 MED ORDER — PANTOPRAZOLE SODIUM 40 MG PO TBEC: DELAYED_RELEASE_TABLET | ORAL | 1 refills | Status: DC

## 2018-03-29 ENCOUNTER — Encounter: Payer: Self-pay | Admitting: Internal Medicine

## 2018-04-10 DIAGNOSIS — D352 Benign neoplasm of pituitary gland: Secondary | ICD-10-CM | POA: Diagnosis not present

## 2018-04-12 ENCOUNTER — Other Ambulatory Visit: Payer: Self-pay | Admitting: *Deleted

## 2018-04-12 ENCOUNTER — Encounter: Payer: Self-pay | Admitting: Internal Medicine

## 2018-04-12 ENCOUNTER — Ambulatory Visit (INDEPENDENT_AMBULATORY_CARE_PROVIDER_SITE_OTHER): Payer: Medicare Other | Admitting: Internal Medicine

## 2018-04-12 VITALS — BP 132/70 | HR 76 | Temp 97.9°F | Resp 16 | Ht 65.0 in | Wt 160.4 lb

## 2018-04-12 DIAGNOSIS — Z1212 Encounter for screening for malignant neoplasm of rectum: Secondary | ICD-10-CM

## 2018-04-12 DIAGNOSIS — R7309 Other abnormal glucose: Secondary | ICD-10-CM

## 2018-04-12 DIAGNOSIS — I10 Essential (primary) hypertension: Secondary | ICD-10-CM | POA: Diagnosis not present

## 2018-04-12 DIAGNOSIS — Z85038 Personal history of other malignant neoplasm of large intestine: Secondary | ICD-10-CM

## 2018-04-12 DIAGNOSIS — Z23 Encounter for immunization: Secondary | ICD-10-CM

## 2018-04-12 DIAGNOSIS — E559 Vitamin D deficiency, unspecified: Secondary | ICD-10-CM | POA: Diagnosis not present

## 2018-04-12 DIAGNOSIS — Z8249 Family history of ischemic heart disease and other diseases of the circulatory system: Secondary | ICD-10-CM | POA: Diagnosis not present

## 2018-04-12 DIAGNOSIS — Z1211 Encounter for screening for malignant neoplasm of colon: Secondary | ICD-10-CM

## 2018-04-12 DIAGNOSIS — Z79899 Other long term (current) drug therapy: Secondary | ICD-10-CM | POA: Diagnosis not present

## 2018-04-12 DIAGNOSIS — Z136 Encounter for screening for cardiovascular disorders: Secondary | ICD-10-CM | POA: Diagnosis not present

## 2018-04-12 DIAGNOSIS — E782 Mixed hyperlipidemia: Secondary | ICD-10-CM

## 2018-04-12 DIAGNOSIS — K219 Gastro-esophageal reflux disease without esophagitis: Secondary | ICD-10-CM

## 2018-04-12 DIAGNOSIS — R7303 Prediabetes: Secondary | ICD-10-CM

## 2018-04-12 MED ORDER — MELOXICAM 15 MG PO TABS: 15.0000 mg | ORAL_TABLET | Freq: Every day | ORAL | 0 refills | Status: DC

## 2018-04-12 NOTE — Patient Instructions (Signed)
We Do NOT Approve of  [facility] [place] Soliciting Our Patients  To Do Home Visits & We Do NOT Approve of [employer] > > > > > > > > > > > > > > > > > > > > > > > > > > > > > > > > > > > > > > >  Preventive Care for Adults  A healthy lifestyle and preventive care can promote health and wellness. Preventive health guidelines for women include the following key practices.  A routine yearly physical is a good way to check with your health care provider about your health and preventive screening. It is a chance to share any concerns and updates on your health and to receive a thorough exam.  Visit your dentist for a routine exam and preventive care every 6 months. Brush your teeth twice a day and floss once a day. Good oral hygiene prevents tooth decay and gum disease.  The frequency of eye exams is based on your age, health, family medical history, use of contact lenses, and other factors. Follow your health care provider's recommendations for frequency of eye exams.  Eat a healthy diet. Foods like vegetables, fruits, whole grains, low-fat dairy products, and lean protein foods contain the nutrients you need without too many calories. Decrease your intake of foods high in solid fats, added sugars, and salt. Eat the right amount of calories for you. Get information about a proper diet from your health care provider, if necessary.  Regular physical exercise is one of the most important things you can do for your health. Most adults should get at least 150 minutes of moderate-intensity exercise (any activity that increases your heart rate and causes you to sweat) each week. In addition, most adults need muscle-strengthening exercises on 2 or more days a week.  Maintain a healthy weight. The body mass index (BMI) is a screening tool to identify possible weight problems. It provides an estimate of body fat based on height and weight. Your health care provider can find your BMI  and can help you achieve or maintain a healthy weight. For adults 20 years and older:  A BMI below 18.5 is considered underweight.  A BMI of 18.5 to 24.9 is normal.  A BMI of 25 to 29.9 is considered overweight.  A BMI of 30 and above is considered obese.  Maintain normal blood lipids and cholesterol levels by exercising and minimizing your intake of saturated fat. Eat a balanced diet with plenty of fruit and vegetables. If your lipid or cholesterol levels are high, you are over 50, or you are at high risk for heart disease, you may need your cholesterol levels checked more frequently. Ongoing high lipid and cholesterol levels should be treated with medicines if diet and exercise are not working.  If you smoke, find out from your health care provider how to quit. If you do not use tobacco, do not start.  Lung cancer screening is recommended for adults aged 55-80 years who are at high risk for developing lung cancer because of a history of smoking. A yearly low-dose CT scan of the lungs is recommended for people who have at least a 30-pack-year history of smoking and are a current smoker or have quit within the past 15 years. A pack year of smoking is smoking an average of 1 pack of cigarettes a day for 1 year (for example: 1 pack a day for 30 years or 2 packs a day   for 15 years). Yearly screening should continue until the smoker has stopped smoking for at least 15 years. Yearly screening should be stopped for people who develop a health problem that would prevent them from having lung cancer treatment.  Avoid use of street drugs. Do not share needles with anyone. Ask for help if you need support or instructions about stopping the use of drugs.  High blood pressure causes heart disease and increases the risk of stroke.  Ongoing high blood pressure should be treated with medicines if weight loss and exercise do not work.  If you are 29-34 years old, ask your health care provider if you should take  aspirin to prevent strokes.  Diabetes screening involves taking a blood sample to check your fasting blood sugar level. This should be done once every 3 years, after age 27, if you are within normal weight and without risk factors for diabetes. Testing should be considered at a younger age or be carried out more frequently if you are overweight and have at least 1 risk factor for diabetes.  Breast cancer screening is essential preventive care for women. You should practice "breast self-awareness." This means understanding the normal appearance and feel of your breasts and may include breast self-examination. Any changes detected, no matter how small, should be reported to a health care provider. Women in their 64s and 30s should have a clinical breast exam (CBE) by a health care provider as part of a regular health exam every 1 to 3 years. After age 69, women should have a CBE every year. Starting at age 6, women should consider having a mammogram (breast X-ray test) every year. Women who have a family history of breast cancer should talk to their health care provider about genetic screening. Women at a high risk of breast cancer should talk to their health care providers about having an MRI and a mammogram every year.  Breast cancer gene (BRCA)-related cancer risk assessment is recommended for women who have family members with BRCA-related cancers. BRCA-related cancers include breast, ovarian, tubal, and peritoneal cancers. Having family members with these cancers may be associated with an increased risk for harmful changes (mutations) in the breast cancer genes BRCA1 and BRCA2. Results of the assessment will determine the need for genetic counseling and BRCA1 and BRCA2 testing.  Routine pelvic exams to screen for cancer are no longer recommended for nonpregnant women who are considered low risk for cancer of the pelvic organs (ovaries, uterus, and vagina) and who do not have symptoms. Ask your health  care provider if a screening pelvic exam is right for you.  If you have had past treatment for cervical cancer or a condition that could lead to cancer, you need Pap tests and screening for cancer for at least 20 years after your treatment. If Pap tests have been discontinued, your risk factors (such as having a new sexual partner) need to be reassessed to determine if screening should be resumed. Some women have medical problems that increase the chance of getting cervical cancer. In these cases, your health care provider may recommend more frequent screening and Pap tests.    Colorectal cancer can be detected and often prevented. Most routine colorectal cancer screening begins at the age of 21 years and continues through age 8 years. However, your health care provider may recommend screening at an earlier age if you have risk factors for colon cancer. On a yearly basis, your health care provider may provide home test kits to check  for hidden blood in the stool. Use of a small camera at the end of a tube, to directly examine the colon (sigmoidoscopy or colonoscopy), can detect the earliest forms of colorectal cancer. Talk to your health care provider about this at age 50, when routine screening begins.  Direct exam of the colon should be repeated every 5-10 years through age 75 years, unless early forms of pre-cancerous polyps or small growths are found.  Osteoporosis is a disease in which the bones lose minerals and strength with aging. This can result in serious bone fractures or breaks. The risk of osteoporosis can be identified using a bone density scan. Women ages 65 years and over and women at risk for fractures or osteoporosis should discuss screening with their health care providers. Ask your health care provider whether you should take a calcium supplement or vitamin D to reduce the rate of osteoporosis.  Menopause can be associated with physical symptoms and risks. Hormone replacement therapy  is available to decrease symptoms and risks. You should talk to your health care provider about whether hormone replacement therapy is right for you.  Use sunscreen. Apply sunscreen liberally and repeatedly throughout the day. You should seek shade when your shadow is shorter than you. Protect yourself by wearing long sleeves, pants, a wide-brimmed hat, and sunglasses year round, whenever you are outdoors.  Once a month, do a whole body skin exam, using a mirror to look at the skin on your back. Tell your health care provider of new moles, moles that have irregular borders, moles that are larger than a pencil eraser, or moles that have changed in shape or color.  Stay current with required vaccines (immunizations).  Influenza vaccine. All adults should be immunized every year.  Tetanus, diphtheria, and acellular pertussis (Td, Tdap) vaccine. Pregnant women should receive 1 dose of Tdap vaccine during each pregnancy. The dose should be obtained regardless of the length of time since the last dose. Immunization is preferred during the 27th-36th week of gestation. An adult who has not previously received Tdap or who does not know her vaccine status should receive 1 dose of Tdap. This initial dose should be followed by tetanus and diphtheria toxoids (Td) booster doses every 10 years. Adults with an unknown or incomplete history of completing a 3-dose immunization series with Td-containing vaccines should begin or complete a primary immunization series including a Tdap dose. Adults should receive a Td booster every 10 years.    Zoster vaccine. One dose is recommended for adults aged 60 years or older unless certain conditions are present.    Pneumococcal 13-valent conjugate (PCV13) vaccine. When indicated, a person who is uncertain of her immunization history and has no record of immunization should receive the PCV13 vaccine. An adult aged 19 years or older who has certain medical conditions and has not  been previously immunized should receive 1 dose of PCV13 vaccine. This PCV13 should be followed with a dose of pneumococcal polysaccharide (PPSV23) vaccine. The PPSV23 vaccine dose should be obtained at least 1 or more year(s) after the dose of PCV13 vaccine. An adult aged 19 years or older who has certain medical conditions and previously received 1 or more doses of PPSV23 vaccine should receive 1 dose of PCV13. The PCV13 vaccine dose should be obtained 1 or more years after the last PPSV23 vaccine dose.    Pneumococcal polysaccharide (PPSV23) vaccine. When PCV13 is also indicated, PCV13 should be obtained first. All adults aged 65 years and older should   be immunized. An adult younger than age 65 years who has certain medical conditions should be immunized. Any person who resides in a nursing home or long-term care facility should be immunized. An adult smoker should be immunized. People with an immunocompromised condition and certain other conditions should receive both PCV13 and PPSV23 vaccines. People with human immunodeficiency virus (HIV) infection should be immunized as soon as possible after diagnosis. Immunization during chemotherapy or radiation therapy should be avoided. Routine use of PPSV23 vaccine is not recommended for American Indians, [place] Natives, or people younger than 65 years unless there are medical conditions that require PPSV23 vaccine. When indicated, people who have unknown immunization and have no record of immunization should receive PPSV23 vaccine. One-time revaccination 5 years after the first dose of PPSV23 is recommended for people aged 19-64 years who have chronic kidney failure, nephrotic syndrome, asplenia, or immunocompromised conditions. People who received 1-2 doses of PPSV23 before age 65 years should receive another dose of PPSV23 vaccine at age 65 years or later if at least 5 years have passed since the previous dose. Doses of PPSV23 are not needed for people immunized  with PPSV23 at or after age 65 years.   Preventive Services / Frequency  Ages 65 years and over  Blood pressure check.  Lipid and cholesterol check.  Lung cancer screening. / Every year if you are aged 55-80 years and have a 30-pack-year history of smoking and currently smoke or have quit within the past 15 years. Yearly screening is stopped once you have quit smoking for at least 15 years or develop a health problem that would prevent you from having lung cancer treatment.  Clinical breast exam.** / Every year after age 40 years.   BRCA-related cancer risk assessment.** / For women who have family members with a BRCA-related cancer (breast, ovarian, tubal, or peritoneal cancers).  Mammogram.** / Every year beginning at age 40 years and continuing for as long as you are in good health. Consult with your health care provider.  Pap test.** / Every 3 years starting at age 30 years through age 65 or 70 years with 3 consecutive normal Pap tests. Testing can be stopped between 65 and 70 years with 3 consecutive normal Pap tests and no abnormal Pap or HPV tests in the past 10 years.  Fecal occult blood test (FOBT) of stool. / Every year beginning at age 50 years and continuing until age 75 years. You may not need to do this test if you get a colonoscopy every 10 years.  Flexible sigmoidoscopy or colonoscopy.** / Every 5 years for a flexible sigmoidoscopy or every 10 years for a colonoscopy beginning at age 50 years and continuing until age 75 years.  Hepatitis C blood test.** / For all people born from 1945 through 1965 and any individual with known risks for hepatitis C.  Osteoporosis screening.** / A one-time screening for women ages 65 years and over and women at risk for fractures or osteoporosis.  Skin self-exam. / Monthly.  Influenza vaccine. / Every year.  Tetanus, diphtheria, and acellular pertussis (Tdap/Td) vaccine.** / 1 dose of Td every 10 years.  Zoster vaccine.** / 1 dose  for adults aged 60 years or older.  Pneumococcal 13-valent conjugate (PCV13) vaccine.** / Consult your health care provider.  Pneumococcal polysaccharide (PPSV23) vaccine.** / 1 dose for all adults aged 65 years and older. Screening for abdominal aortic aneurysm (AAA)  by ultrasound is recommended for people who have history of high blood pressure   or who are current or former smokers. ++++++++++++++++++++ Recommend Adult Low Dose Aspirin or  coated  Aspirin 81 mg daily  To reduce risk of Colon Cancer 20 %,  Skin Cancer 26 % ,  Melanoma 46%  and  Pancreatic cancer 60% ++++++++++++++++++++ Vitamin D goal  is between 70-100.  Please make sure that you are taking your Vitamin D as directed.  It is very important as a natural anti-inflammatory  helping hair, skin, and nails, as well as reducing stroke and heart attack risk.  It helps your bones and helps with mood. It also decreases numerous cancer risks so please take it as directed.  Low Vit D is associated with a 200-300% higher risk for CANCER  and 200-300% higher risk for HEART   ATTACK  &  STROKE.   ...................................... It is also associated with higher death rate at younger ages,  autoimmune diseases like Rheumatoid arthritis, Lupus, Multiple Sclerosis.    Also many other serious conditions, like depression, Alzheimer's Dementia, infertility, muscle aches, fatigue, fibromyalgia - just to name a few. ++++++++++++++++++ Recommend the book "The END of DIETING" by Dr Joel Fuhrman  & the book "The END of DIABETES " by Dr Joel Fuhrman At [employer] - get book & Audio CD's    Being diabetic has a  300% increased risk for heart attack, stroke, cancer, and alzheimer- type vascular dementia. It is very important that you work harder with diet by avoiding all foods that are white. Avoid white rice ([place] & wild rice is OK), white potatoes (sweetpotatoes in moderation is OK), White bread or wheat bread or anything made out of  white flour like bagels, donuts, rolls, buns, biscuits, cakes, pastries, cookies, pizza crust, and pasta (made from white flour & egg whites) - vegetarian pasta or spinach or wheat pasta is OK. Multigrain breads like Arnold's or Pepperidge Farm, or multigrain sandwich thins or flatbreads.  Diet, exercise and weight loss can reverse and cure diabetes in the early stages.  Diet, exercise and weight loss is very important in the control and prevention of complications of diabetes which affects every system in your body, ie. Brain - dementia/stroke, eyes - glaucoma/blindness, heart - heart attack/heart failure, kidneys - dialysis, stomach - gastric paralysis, intestines - malabsorption, nerves - severe painful neuritis, circulation - gangrene & loss of a leg(s), and finally cancer and Alzheimers.    I recommend avoid fried & greasy foods,  sweets/candy, white rice ([place] or wild rice or Quinoa is OK), white potatoes (sweet potatoes are OK) - anything made from white flour - bagels, doughnuts, rolls, buns, biscuits,white and wheat breads, pizza crust and traditional pasta made of white flour & egg white(vegetarian pasta or spinach or wheat pasta is OK).  Multi-grain bread is OK - like multi-grain flat bread or sandwich thins. Avoid alcohol in excess. Exercise is also important.    Eat all the vegetables you want - avoid meat, especially red meat and dairy - especially cheese.  Cheese is the most concentrated form of trans-fats which is the worst thing to clog up our arteries. Veggie cheese is OK which can be found in the fresh produce section at [employer] or [employer] or [employer]  +++++++++++++++++++ DASH Eating Plan  DASH stands for "Dietary Approaches to Stop Hypertension."   The DASH eating plan is a healthy eating plan that has been shown to reduce high blood pressure (hypertension). Additional health benefits may include reducing the risk of type 2 diabetes mellitus, heart disease,   and stroke. The  DASH eating plan may also help with weight loss. WHAT DO I NEED TO KNOW ABOUT THE DASH EATING PLAN? For the DASH eating plan, you will follow these general guidelines:  Choose foods with a percent daily value for sodium of less than 5% (as listed on the food label).  Use salt-free seasonings or herbs instead of table salt or sea salt.  Check with your health care provider or pharmacist before using salt substitutes.  Eat lower-sodium products, often labeled as "lower sodium" or "no salt added."  Eat fresh foods.  Eat more vegetables, fruits, and low-fat dairy products.  Choose whole grains. Look for the word "whole" as the first word in the ingredient list.  Choose fish   Limit sweets, desserts, sugars, and sugary drinks.  Choose heart-healthy fats.  Eat veggie cheese   Eat more home-cooked food and less restaurant, buffet, and fast food.  Limit fried foods.  Cook foods using methods other than frying.  Limit canned vegetables. If you do use them, rinse them well to decrease the sodium.  When eating at a restaurant, ask that your food be prepared with less salt, or no salt if possible.                      WHAT FOODS CAN I EAT? Read Dr [name] Fuhrman's books on The End of Dieting & The End of Diabetes  Grains Whole grain or whole wheat bread. Romanoff rice. Whole grain or whole wheat pasta. Quinoa, bulgur, and whole grain cereals. Low-sodium cereals. Corn or whole wheat flour tortillas. Whole grain cornbread. Whole grain crackers. Low-sodium crackers.  Vegetables Fresh or frozen vegetables (raw, steamed, roasted, or grilled). Low-sodium or reduced-sodium tomato and vegetable juices. Low-sodium or reduced-sodium tomato sauce and paste. Low-sodium or reduced-sodium canned vegetables.   Fruits All fresh, canned (in natural juice), or frozen fruits.  Protein Products  All fish and seafood.  Dried beans, peas, or lentils. Unsalted nuts and seeds. Unsalted canned  beans.  Dairy Low-fat dairy products, such as skim or 1% milk, 2% or reduced-fat cheeses, low-fat ricotta or cottage cheese, or plain low-fat yogurt. Low-sodium or reduced-sodium cheeses.  Fats and Oils Tub margarines without trans fats. Light or reduced-fat mayonnaise and salad dressings (reduced sodium). Avocado. Safflower, olive, or canola oils. Natural peanut or almond butter.  Other Unsalted popcorn and pretzels. The items listed above may not be a complete list of recommended foods or beverages. Contact your dietitian for more options.  +++++++++++++++  WHAT FOODS ARE NOT RECOMMENDED? Grains/ White flour or wheat flour White bread. White pasta. White rice. Refined cornbread. Bagels and croissants. Crackers that contain trans fat.  Vegetables  Creamed or fried vegetables. Vegetables in a . Regular canned vegetables. Regular canned tomato sauce and paste. Regular tomato and vegetable juices.  Fruits Dried fruits. Canned fruit in light or heavy syrup. Fruit juice.  Meat and Other Protein Products Meat in general - RED meat & White meat.  Fatty cuts of meat. Ribs, chicken wings, all processed meats as bacon, sausage, bologna, salami, fatback, hot dogs, bratwurst and packaged luncheon meats.  Dairy Whole or 2% milk, cream, half-and-half, and cream cheese. Whole-fat or sweetened yogurt. Full-fat cheeses or blue cheese. Non-dairy creamers and whipped toppings. Processed cheese, cheese spreads, or cheese curds.  Condiments Onion and garlic salt, seasoned salt, table salt, and sea salt. Canned and packaged gravies. Worcestershire sauce. Tartar sauce. Barbecue sauce. Teriyaki sauce. Soy sauce, including reduced  sodium. Steak sauce. Fish sauce. Oyster sauce. Cocktail sauce. Horseradish. Ketchup and mustard. Meat flavorings and tenderizers. Bouillon cubes. Hot sauce. Tabasco sauce. Marinades. Taco seasonings. Relishes.  Fats and Oils Butter, stick margarine, lard, shortening and bacon  fat. Coconut, palm kernel, or palm oils. Regular salad dressings.  Pickles and olives. Salted popcorn and pretzels.  The items listed above may not be a complete list of foods and beverages to avoid.

## 2018-04-12 NOTE — Progress Notes (Signed)
[place] [facility] Unk Pinto, M.D.     Uvaldo Bristle. Silverio Lay, P.A.-C Liane Comber, [place] [address] [place] [address] Telephone [phone] Telefax [phone]  Comprehensive Evaluation &  Examination     This very nice 77 y.o. [facility] presents for a  comprehensive evaluation and management of multiple medical co-morbidities.  Patient has been followed for HTN, HLD, T2_NIDDM  Prediabetes  and Vitamin D Deficiency.  She underwent transnasal resection of a benign pituitary tumor in 2013.      HTN predates since 1999. Patient's BP has been controlled at home and patient denies any cardiac symptoms as chest pain, palpitations, shortness of breath, dizziness or ankle swelling.  In 2011, she had a Negative Cardiolite. Today's BP is at goal - 132/70.      Patient's hyperlipidemia is controlled with diet and medications. Patient denies myalgias or other medication SE's. Last lipids were at goal:  Lab Results  Component Value Date   CHOL 121 01/08/2018   HDL 50 (L) 01/08/2018   LDLCALC 54 01/08/2018   TRIG 86 01/08/2018   CHOLHDL 2.4 01/08/2018      Patient has hx/o prediabetes (A1c 5.9% / I 90 / 2011 & A1c 6.1% / 2012 & 2014) and patient denies reactive hypoglycemic symptoms, visual blurring, diabetic polys or paresthesias. Last A1c was not at goal: Lab Results  Component Value Date   HGBA1C 5.9 (H) 01/08/2018      Finally, patient has history of Vitamin D Deficiency ("31" / 2008) and last Vitamin D was at goal: Lab Results  Component Value Date   VD25OH 56 09/21/2017   Current Outpatient Medications on File Prior to Visit  Medication Sig  . amLODipine (NORVASC) 2.5 MG tablet Take 1 tablet (2.5 mg total) by mouth daily.  Marland Kitchen aspirin EC 81 MG tablet Take 81 mg by mouth daily.  Marland Kitchen atorvastatin (LIPITOR) 80 MG tablet Take 1 tablet (80 mg total) by mouth daily. for cholesterol  . Cholecalciferol 1000 units  tablet Take 1,500 Units by mouth daily.  Marland Kitchen gabapentin (NEURONTIN) 100 MG capsule Take 1-3 caps up to three times a day as needed for burning nerve pain. May cause drowsiness.  Marland Kitchen lisinopril (PRINIVIL,ZESTRIL) 10 MG tablet Take 1 tablet (10 mg total) by mouth daily.  . pantoprazole (PROTONIX) 40 MG tablet Take 1 tablet daily at suppertime for Acid Reflux  . PARoxetine (PAXIL) 20 MG tablet Take 1 tablet (20 mg total) by mouth daily.  . ranitidine (ZANTAC) 75 MG tablet Take 2 tablets (150 mg total) by mouth 2 (two) times daily.   No current facility-administered medications on file prior to visit.    Allergies  Allergen Reactions  . Aspirin Other (See Comments)    High Doses stomach trouble  . Codeine Nausea And Vomiting  . Fenofibrate Cough  . Hydrochlorothiazide Other (See Comments)    Fatigue  . Metoclopramide Other (See Comments)    Sedation   Past Medical History:  Diagnosis Date  . Arthritis   . Colon cancer ([place]) 1990s, 2014  . Colon polyps   . Depression   . Dysrhythmia 04/2012   SVT - RATE 145 POST OP CRANIOTOMY SURGERY --TX'D AND RESOLVED--PT STATES NO FURTHER PROBLEM THAT SHE IS AWARE OF -SHE HAS NOT HAD TO SEE CARDIOLOGIST  . GERD (gastroesophageal reflux disease)   . Headache(784.0)    hx migraines  . Hypertension    pt states she has not taken any  b/p medication since her brain surgery in oct 3013--took herself off the medication  . Pituitary mass ([place]) NOV 2013   MASS FOUND AFTER PT EXPERIENCED BILATERAL BLURRED / HAZY VISION -ESP LEFT EYE.  S/P CRANIOTOMY AND EYESIGHT RETURNED TO NORMAL--PT STILL HAS A LOT OF TENDERNESS RIGHT SIDE OF NOSE --THE CRANIOTOMY WAS DONE BY TRANSNASAL APPROACH  . Pneumonia yrs ago   hx of pneumonia  . PONV (postoperative nausea and vomiting)    PT STATES PLEASE NOTE SHE HAS TENDERNESS RIGHT SIDE OF NOSE - SINCE TRANSNASAL CRANIOTOMY 04/2012  . Prediabetes   . Vitamin D deficiency    Health Maintenance  Topic Date Due  . INFLUENZA  VACCINE  01/04/2018  . DEXA SCAN  01/09/2019 (Originally 06/04/2005)  . TETANUS/TDAP  11/30/2025  . PNA vac Low Risk Adult  Completed   Immunization History  Administered Date(s) Administered  . Influenza Split 04/19/2012  . Influenza, High Dose Seasonal PF 04/17/2013, 03/12/2014, 03/03/2017  . Pneumococcal Conjugate-13 09/30/2014  . Pneumococcal Polysaccharide-23 04/19/2012  . Td 07/20/2005, 12/01/2015   Last Colon - 08/29/2013 - Colon - Dr Amedeo Plenty - recc 2 yr f/u - Overdue  11/2012 Rt Hemicolectomy - Dr Kaylyn Lim for  for a mod well diff adenocarcinoma in a tubular adenoma (polyp)  w/ a clean resection.   Last MGM - 10/15/2013 - f/u overdue   Past Surgical History:  Procedure Laterality Date  . BACK SURGERY     lower  . CATARACT EXTRACTION  June/july 2013   bilateral eyes  . COLON RESECTION  1988  . COLON SURGERY  1990's  . COLONOSCOPY  09/20/2011   Procedure: COLONOSCOPY;  Surgeon: Missy Sabins, MD;  Location: [facility];  Service: Endoscopy;  Laterality: N/A;  . COLONOSCOPY  12/15/2011   Procedure: COLONOSCOPY;  Surgeon: Missy Sabins, MD;  Location: [facility];  Service: Endoscopy;  Laterality: N/A;  . COLONOSCOPY  01/26/2012   Procedure: COLONOSCOPY;  Surgeon: Cleotis Nipper, MD;  Location: [facility];  Service: Endoscopy;  Laterality: N/A;  . CRANIOTOMY  04/18/2012   Procedure: CRANIOTOMY HYPOPHYSECTOMY TRANSNASAL APPROACH;  Surgeon: Ophelia Charter, MD;  Location: [facility];  Service: Neurosurgery;  Laterality: Bilateral;  Transphenoidal resection of Pituitary tumor.   . CYSTOSCOPY WITH URETEROSCOPY AND STENT PLACEMENT Bilateral 10/01/2015   Procedure: CYSTOSCOPY, retrograde WITH URETEROSCOPY AND STENT PLACEMENT, ;  Surgeon: Raynelle Bring, MD;  Location: [facility];  Service: Urology;  Laterality: Bilateral;  . CYSTOSCOPY WITH URETEROSCOPY AND STENT PLACEMENT Right 10/19/2015   Procedure: CYSTOSCOPY,  RIGHT URETEROSCOPY, HOLMIUM LASER RIGHT URETERAL STONE, RIGHT  URETERAL STENT PLACEMENT, LEFT URETEROSCOPY, INSERTION LEFT URETERAL STENT;  Surgeon: Raynelle Bring, MD;  Location: [facility];  Service: Urology;  Laterality: Right;  . EYE SURGERY     cataract bil  . HOLMIUM LASER APPLICATION Right 1/82/9937   Procedure: HOLMIUM LASER  ;  Surgeon: Raynelle Bring, MD;  Location: [facility];  Service: Urology;  Laterality: Right;  . HOT HEMOSTASIS  09/20/2011   Procedure: HOT HEMOSTASIS (ARGON PLASMA COAGULATION/BICAP);  Surgeon: Missy Sabins, MD;  Location: [facility];  Service: Endoscopy;  Laterality: N/A;  . LAPAROSCOPIC RIGHT HEMI COLECTOMY  06/27/2012   Procedure: LAPAROSCOPIC RIGHT HEMI COLECTOMY;  Surgeon: Pedro Earls, MD;  Location: [facility];  Service: General;  Laterality: N/A;  Laparoscopic Assited Right Hemicolectomy  . TRANSNASAL APPROACH  04/18/2012   Procedure: TRANSNASAL APPROACH;  Surgeon: Rozetta Nunnery, MD;  Location: [facility];  Service: ENT;  Laterality: Bilateral;  Transnasal approach  . TUMOR REMOVAL     Left leg   Family History  Problem Relation Age of Onset  . Colon cancer Sister 35  . Heart disease Mother   . Heart disease Father   . Cancer Maternal Aunt        unknown form of cancer dx in her 16s-50s  . Liver cancer Maternal Uncle        heavy ETOH user  . Breast cancer Sister 54  . Dementia Sister   . Colon cancer Cousin   . Colon polyps Son   . Colon polyps Son   . Colon cancer Other 30  . Colon polyps Other   . Brain cancer Cousin        dx in his 5s; paternal cousin   Social History   Tobacco Use  . Smoking status: Never Smoker  . Smokeless tobacco: Never Used  Substance Use Topics  . Alcohol use: No    Comment: a couple times a year, one drink at a time  . Drug use: No    ROS Constitutional: Denies fever, chills, weight loss/gain, headaches, insomnia,  night sweats, and change in appetite. Does c/o fatigue. Eyes: Denies redness, blurred vision, diplopia, discharge, itchy, watery eyes.  ENT: Denies  discharge, congestion, post nasal drip, epistaxis, sore throat, earache, hearing loss, dental pain, Tinnitus, Vertigo, Sinus pain, snoring.  Cardio: Denies chest pain, palpitations, irregular heartbeat, syncope, dyspnea, diaphoresis, orthopnea, PND, claudication, edema Respiratory: denies cough, dyspnea, DOE, pleurisy, hoarseness, laryngitis, wheezing.  Gastrointestinal: Denies dysphagia, heartburn, reflux, water brash, pain, cramps, nausea, vomiting, bloating, diarrhea, constipation, hematemesis, melena, hematochezia, jaundice, hemorrhoids Genitourinary: Denies dysuria, frequency, urgency, nocturia, hesitancy, discharge, hematuria, flank pain Breast: Breast lumps, nipple discharge, bleeding.  Musculoskeletal: Denies arthralgia, myalgia, stiffness, Jt. Swelling, pain, limp, and strain/sprain. Denies falls. Skin: Denies puritis, rash, hives, warts, acne, eczema, changing in skin lesion Neuro: No weakness, tremor, incoordination, spasms, paresthesia, pain Psychiatric: Denies confusion, memory loss, sensory loss. Denies Depression. Endocrine: Denies change in weight, skin, hair change, nocturia, and paresthesia, diabetic polys, visual blurring, hyper / hypo glycemic episodes.  Heme/Lymph: No excessive bleeding, bruising, enlarged lymph nodes.  Physical Exam  BP 132/70   Pulse 76   Temp 97.9 F (36.6 C)   Resp 16   Ht 5\' 5"  (1.651 m)   Wt 160 lb 6.4 oz (72.8 kg)   BMI 26.69 kg/m   General Appearance: Over nourished, well groomed and in no apparent distress.  Eyes: PERRLA, EOMs, conjunctiva no swelling or erythema, normal fundi and vessels. Sinuses: No frontal/maxillary tenderness ENT/Mouth: EACs patent / TMs  nl. Nares clear without erythema, swelling, mucoid exudates. Oral hygiene is good. No erythema, swelling, or exudate. Tongue normal, non-obstructing. Tonsils not swollen or erythematous. Hearing normal.  Neck: Supple, thyroid not palpable. No bruits, nodes or JVD. Respiratory:  Respiratory effort normal.  BS equal and clear bilateral without rales, rhonci, wheezing or stridor. Cardio: Heart sounds are normal with regular rate and rhythm and no murmurs, rubs or gallops. Peripheral pulses are normal and equal bilaterally without edema. No aortic or femoral bruits. Chest: symmetric with normal excursions and percussion. Breasts: Symmetric, without lumps, nipple discharge, retractions, or fibrocystic changes.  Abdomen: Flat, soft with bowel sounds active. Nontender, no guarding, rebound, hernias, masses, or organomegaly.  Lymphatics: Non tender without lymphadenopathy.  Genitourinary:  Musculoskeletal: Full ROM all peripheral extremities, joint stability, 5/5 strength, and normal gait. Skin: Warm and dry without rashes, lesions, cyanosis, clubbing  or  ecchymosis.  Neuro: Cranial nerves intact, reflexes equal bilaterally. Normal muscle tone, no cerebellar symptoms. Sensation intact.  Pysch: Alert and oriented X 3, normal affect, Insight and Judgment appropriate.   Assessment and Plan  1. Essential hypertension  - EKG 12-Lead - Urinalysis, Routine w reflex microscopic - Microalbumin / creatinine urine ratio - CBC with Differential/Platelet - COMPLETE METABOLIC PANEL WITH GFR - Magnesium - TSH  2. Hyperlipidemia, mixed  - EKG 12-Lead - Lipid panel - TSH  3. Abnormal glucose  - EKG 12-Lead - Hemoglobin A1c - Insulin, random  4. Vitamin D deficiency  - VITAMIN D 25 Hydroxyl  5. Prediabetes  - EKG 12-Lead - Hemoglobin A1c - Insulin, random  6. Gastroesophageal reflux disease  - CBC with Differential/Platelet  7. History of colon cancer, stage II  - POC Hemoccult Bld/Stl   8. Screening for colorectal cancer  - Overdue f/u  - POC Hemoccult Bld/Stl   9. Screening for ischemic heart disease  - EKG 12-Lead  10. FHx: heart disease  - EKG 12-Lead  11. Medication management  - Urinalysis, Routine w reflex microscopic - Microalbumin /  creatinine urine ratio - CBC with Differential/Platelet - COMPLETE METABOLIC PANEL WITH GFR - Magnesium - Lipid panel - TSH - Hemoglobin A1c - Insulin, random - VITAMIN D 25 Hydroxyl        Patient was counseled in prudent diet to achieve/maintain BMI less than 25 for weight control, BP monitoring, regular exercise and medications. Discussed med's effects and SE's. Screening labs and tests as requested with regular follow-up as recommended. Over 40 minutes of exam, counseling, chart review and high complex critical decision making was performed.

## 2018-04-13 LAB — COMPLETE METABOLIC PANEL WITH GFR
AG RATIO: 2 (calc) (ref 1.0–2.5)
ALBUMIN MSPROF: 4.4 g/dL (ref 3.6–5.1)
ALT: 18 U/L (ref 6–29)
AST: 17 U/L (ref 10–35)
Alkaline phosphatase (APISO): 88 U/L (ref 33–130)
BILIRUBIN TOTAL: 0.8 mg/dL (ref 0.2–1.2)
BUN: 12 mg/dL (ref 7–25)
CALCIUM: 9.7 mg/dL (ref 8.6–10.4)
CHLORIDE: 107 mmol/L (ref 98–110)
CO2: 30 mmol/L (ref 20–32)
Creat: 0.75 mg/dL (ref 0.60–0.93)
GFR, EST AFRICAN AMERICAN: 89 mL/min/{1.73_m2} (ref >=60)
GFR, Est Non African American: 77 mL/min/{1.73_m2} (ref >=60)
Globulin: 2.2 g/dL (calc) (ref 1.9–3.7)
Glucose, Bld: 95 mg/dL (ref 65–99)
POTASSIUM: 4.2 mmol/L (ref 3.5–5.3)
Sodium: 144 mmol/L (ref 135–146)
TOTAL PROTEIN: 6.6 g/dL (ref 6.1–8.1)

## 2018-04-13 LAB — URINALYSIS, ROUTINE W REFLEX MICROSCOPIC
BILIRUBIN URINE: NEGATIVE
Bacteria, UA: NONE SEEN /HPF
GLUCOSE, UA: NEGATIVE
HGB URINE DIPSTICK: NEGATIVE
HYALINE CAST: NONE SEEN /LPF
KETONES UR: NEGATIVE
NITRITE: NEGATIVE
PH: 6.5 (ref 5.0–8.0)
Specific Gravity, Urine: 1.021 (ref 1.001–1.03)
Squamous Epithelial / LPF: NONE SEEN /HPF (ref <=5)

## 2018-04-13 LAB — MAGNESIUM: Magnesium: 1.9 mg/dL (ref 1.5–2.5)

## 2018-04-13 LAB — CBC WITH DIFFERENTIAL/PLATELET
BASOS PCT: 0.8 %
Basophils Absolute: 42 cells/uL (ref 0–200)
EOS PCT: 6.3 %
Eosinophils Absolute: 328 cells/uL (ref 15–500)
HEMATOCRIT: 37.5 % (ref 35.0–45.0)
HEMOGLOBIN: 12.5 g/dL (ref 11.7–15.5)
LYMPHS ABS: 1009 {cells}/uL (ref 850–3900)
MCH: 28.5 pg (ref 27.0–33.0)
MCHC: 33.3 g/dL (ref 32.0–36.0)
MCV: 85.6 fL (ref 80.0–100.0)
MONOS PCT: 7.5 %
MPV: 11 fL (ref 7.5–12.5)
NEUTROS ABS: 3432 {cells}/uL (ref 1500–7800)
Neutrophils Relative %: 66 %
Platelets: 246 10*3/uL (ref 140–400)
RBC: 4.38 10*6/uL (ref 3.80–5.10)
RDW: 13.1 % (ref 11.0–15.0)
Total Lymphocyte: 19.4 %
WBC mixed population: 390 cells/uL (ref 200–950)
WBC: 5.2 10*3/uL (ref 3.8–10.8)

## 2018-04-13 LAB — INSULIN, RANDOM: Insulin: 5.8 u[IU]/mL (ref 2.0–19.6)

## 2018-04-13 LAB — LIPID PANEL
Cholesterol: 140 mg/dL (ref <200)
HDL: 51 mg/dL (ref >50)
LDL Cholesterol (Calc): 71 mg/dL (calc)
NON-HDL CHOLESTEROL (CALC): 89 mg/dL (ref <130)
Total CHOL/HDL Ratio: 2.7 (calc) (ref <5.0)
Triglycerides: 94 mg/dL (ref <150)

## 2018-04-13 LAB — HEMOGLOBIN A1C
Hgb A1c MFr Bld: 5.9 % of total Hgb — ABNORMAL HIGH (ref <5.7)
MEAN PLASMA GLUCOSE: 123 (calc)
eAG (mmol/L): 6.8 (calc)

## 2018-04-13 LAB — TSH: TSH: 1.6 m[IU]/L (ref 0.40–4.50)

## 2018-04-13 LAB — VITAMIN D 25 HYDROXY (VIT D DEFICIENCY, FRACTURES): Vit D, 25-Hydroxy: 42 ng/mL (ref 30–100)

## 2018-04-13 LAB — MICROALBUMIN / CREATININE URINE RATIO
Creatinine, Urine: 290 mg/dL — ABNORMAL HIGH (ref 20–275)
MICROALB/CREAT RATIO: 13 ug/mg{creat} (ref <30)
Microalb, Ur: 3.8 mg/dL

## 2018-04-18 DIAGNOSIS — D352 Benign neoplasm of pituitary gland: Secondary | ICD-10-CM | POA: Diagnosis not present

## 2018-04-18 DIAGNOSIS — G9389 Other specified disorders of brain: Secondary | ICD-10-CM | POA: Diagnosis not present

## 2018-05-10 DIAGNOSIS — R197 Diarrhea, unspecified: Secondary | ICD-10-CM | POA: Diagnosis not present

## 2018-05-10 DIAGNOSIS — Z86010 Personal history of colonic polyps: Secondary | ICD-10-CM | POA: Diagnosis not present

## 2018-06-05 DIAGNOSIS — D352 Benign neoplasm of pituitary gland: Secondary | ICD-10-CM | POA: Diagnosis not present

## 2018-07-03 DIAGNOSIS — D122 Benign neoplasm of ascending colon: Secondary | ICD-10-CM | POA: Diagnosis not present

## 2018-07-03 DIAGNOSIS — K648 Other hemorrhoids: Secondary | ICD-10-CM | POA: Diagnosis not present

## 2018-07-03 DIAGNOSIS — Z86010 Personal history of colonic polyps: Secondary | ICD-10-CM | POA: Diagnosis not present

## 2018-07-03 DIAGNOSIS — K5730 Diverticulosis of large intestine without perforation or abscess without bleeding: Secondary | ICD-10-CM | POA: Diagnosis not present

## 2018-07-03 LAB — HM COLONOSCOPY

## 2018-07-06 DIAGNOSIS — D122 Benign neoplasm of ascending colon: Secondary | ICD-10-CM | POA: Diagnosis not present

## 2018-07-12 ENCOUNTER — Other Ambulatory Visit: Payer: Self-pay | Admitting: Internal Medicine

## 2018-07-12 DIAGNOSIS — K219 Gastro-esophageal reflux disease without esophagitis: Secondary | ICD-10-CM

## 2018-07-12 DIAGNOSIS — E782 Mixed hyperlipidemia: Secondary | ICD-10-CM

## 2018-07-12 DIAGNOSIS — I10 Essential (primary) hypertension: Secondary | ICD-10-CM

## 2018-07-12 DIAGNOSIS — F325 Major depressive disorder, single episode, in full remission: Secondary | ICD-10-CM

## 2018-07-13 MED ORDER — PANTOPRAZOLE SODIUM 40 MG PO TBEC: DELAYED_RELEASE_TABLET | ORAL | 0 refills | Status: DC

## 2018-07-13 MED ORDER — ATORVASTATIN CALCIUM 80 MG PO TABS: ORAL_TABLET | ORAL | 0 refills | Status: DC

## 2018-07-13 MED ORDER — MELOXICAM 15 MG PO TABS: ORAL_TABLET | ORAL | 0 refills | Status: DC

## 2018-07-13 MED ORDER — PAROXETINE HCL 30 MG PO TABS: ORAL_TABLET | ORAL | 0 refills | Status: DC

## 2018-07-13 MED ORDER — GABAPENTIN 100 MG PO CAPS: ORAL_CAPSULE | ORAL | 1 refills | Status: DC

## 2018-07-13 MED ORDER — AMLODIPINE BESYLATE 2.5 MG PO TABS: ORAL_TABLET | ORAL | 0 refills | Status: DC

## 2018-07-13 MED ORDER — LISINOPRIL 10 MG PO TABS: ORAL_TABLET | ORAL | 0 refills | Status: DC

## 2018-07-14 ENCOUNTER — Other Ambulatory Visit: Payer: Self-pay | Admitting: Internal Medicine

## 2018-07-14 DIAGNOSIS — E782 Mixed hyperlipidemia: Secondary | ICD-10-CM

## 2018-07-14 DIAGNOSIS — I10 Essential (primary) hypertension: Secondary | ICD-10-CM

## 2018-07-17 ENCOUNTER — Ambulatory Visit: Payer: Self-pay | Admitting: Adult Health

## 2018-07-23 ENCOUNTER — Other Ambulatory Visit: Payer: Self-pay | Admitting: *Deleted

## 2018-07-23 ENCOUNTER — Encounter: Payer: Self-pay | Admitting: *Deleted

## 2018-07-23 DIAGNOSIS — F325 Major depressive disorder, single episode, in full remission: Secondary | ICD-10-CM

## 2018-07-23 MED ORDER — PAROXETINE HCL 30 MG PO TABS: ORAL_TABLET | ORAL | 0 refills | Status: DC

## 2018-07-24 IMAGING — CR DG ANKLE COMPLETE 3+V*R*
3 series · 3 of 3 positions shown · non-contrast
Comparison: None.

CLINICAL DATA: Fall.  Swelling and pain.

EXAM:
RIGHT ANKLE - COMPLETE 3+ VIEW

[ankle ap]
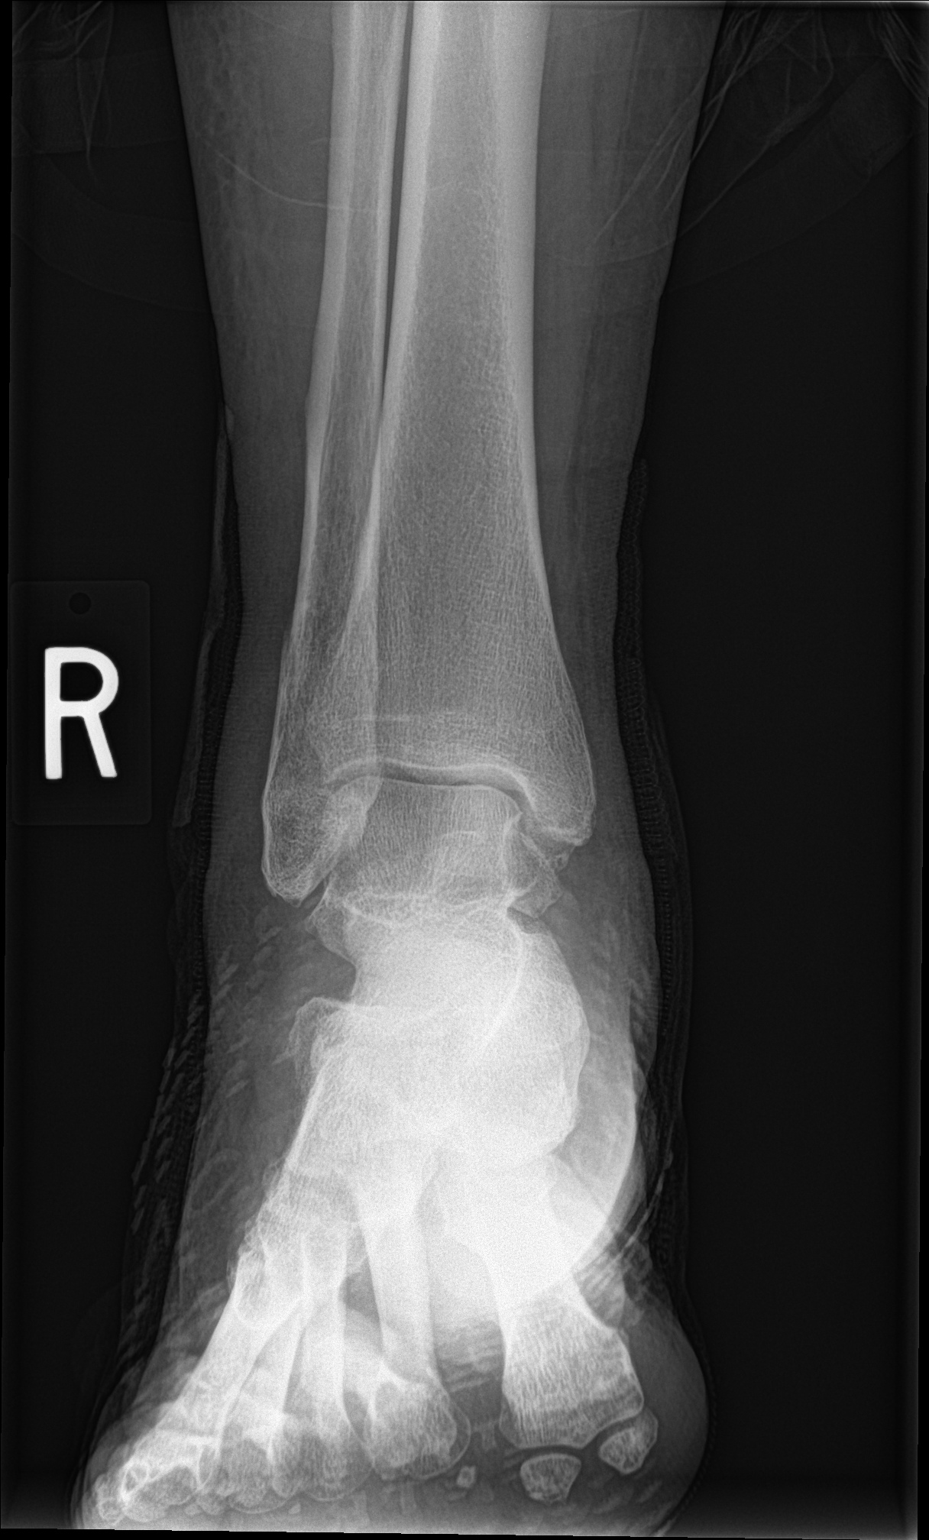

[ankle obl]
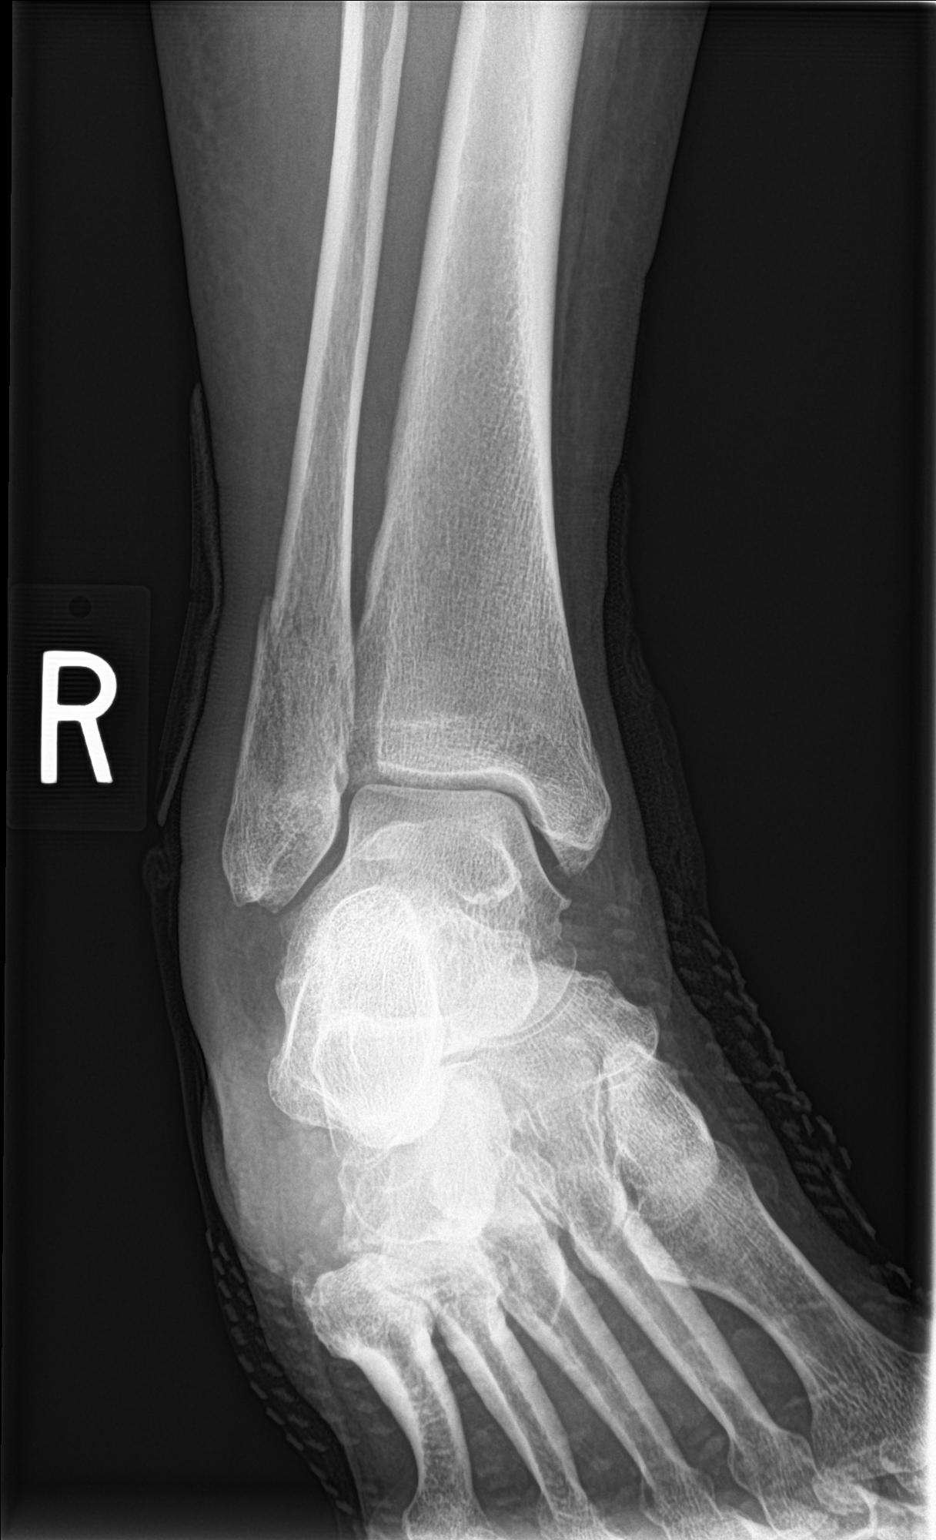

[ankle lat]
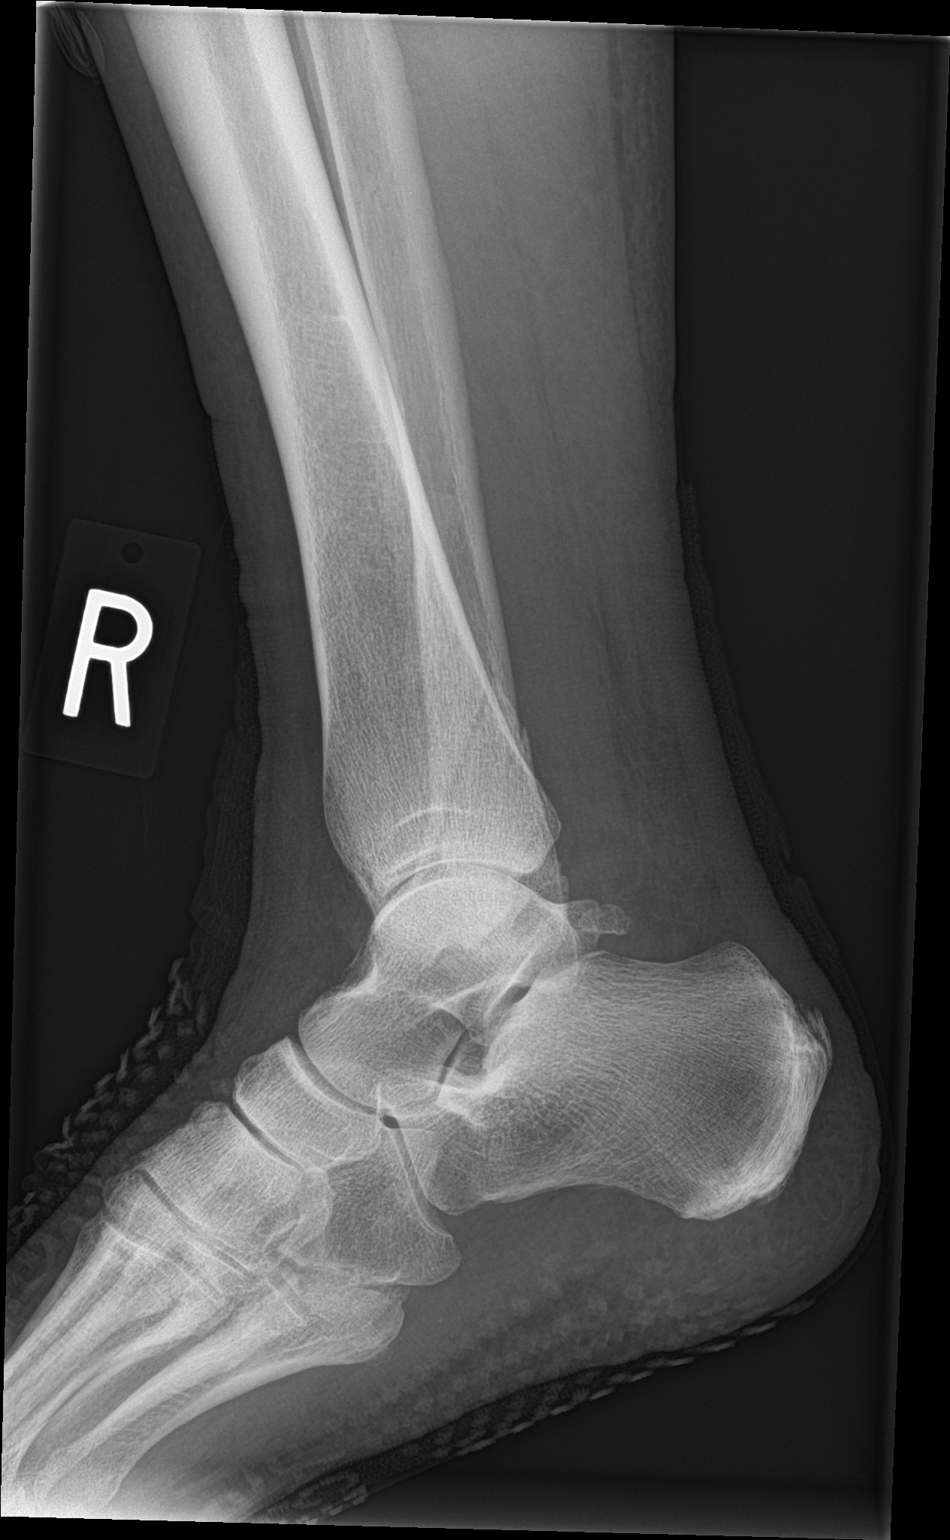

[3 of 3 positions shown; findings below may reference images not displayed]

FINDINGS: There is an oblique fracture deformity involving the distal fibula.
There is very mild lateral and posterior displacement of the distal
fracture fragments. Diffuse soft tissue swelling is identified. No
radio-opaque foreign bodies are soft tissue calcifications.
IMPRESSION: 1. Distal right fibular fracture.

## 2018-07-24 NOTE — Progress Notes (Signed)
FOLLOW UP  Assessment and Plan:   Hypertension At goal; she would like to stop lisinopril, start losartan 25-50 mg for BP goal  Monitor blood pressure at home; patient to call if consistently greater than 130/80 Continue DASH diet.   Reminder to go to the ER if any CP, SOB, nausea, dizziness, severe HA, changes vision/speech, left arm numbness and tingling and jaw pain.  Cholesterol At goal on statin Continue low cholesterol diet and exercise.  Check lipid panel.   Prediabetes Continue diet and exercise.  Perform daily foot/skin check, notify office of any concerning changes.  Check A1C  Overweight Long discussion about weight loss, diet, and exercise Recommended diet heavy in fruits and veggies and low in animal meats, cheeses, and dairy products, appropriate calorie intake Patient will work on increasing exercise,  Will follow up in 3 months  GERD Poorly controlled by ranitidine 150 mg BID; continue protonix 40 mg daily Monitor calcium, vit D, magnesium levels closely  Discussed diet, avoiding triggers and other lifestyle changes  Depression/anxiety Well controlled; continue medications - has failed attempts at taper in the past Lifestyle discussed: diet/exerise, sleep hygiene, stress management, hydration  Vitamin D Def Below goal at last visit; increase from 1500 to 2250 IU continue supplementation to maintain goal of 60-100 Defer Vit D level  Continue diet and meds as discussed. Further disposition pending results of labs. Discussed med's effects and SE's.   Over 30 minutes of exam, counseling, chart review, and critical decision making was performed.   Future Appointments  Date Time Provider [place]  10/23/2018  3:30 PM Unk Pinto, MD [facility] None  01/14/2019  4:15 PM Liane Comber, NP [facility] None  04/29/2019 10:00 AM Unk Pinto, MD [facility] None     ----------------------------------------------------------------------------------------------------------------------  HPI 78 y.o. female  presents for 3 month follow up on hypertension, cholesterol, prediabetes, weight, depression and vitamin D deficiency.   In 2013, patient underwent resection of a pituitary tumor, followed by Dr. Arnoldo Morale, reports recent recurrence, "about the size of a pea"  - she has some dizziness and blurry vision, has been referred to Dr. Zadie Rhine for evaluation.   She has a history of colon cancer over 10 years ago, resolved with resection, has been clear on follow up colonoscopies and has final check that will be scheduled soon.   she has a diagnosis of depression and is currently on paxil 20 mg daily, reports symptoms are well controlled on current regimen. She reports she has been on this medication since she lost her son and husband. She has trialed tapers without success, and wishes to remain on at this time.   she has a diagnosis of GERD which is currently managed by Protonix 40 mg daily (poorly controlled previously by BID ranitidine) she reports symptoms is currently well controlled, and endorses breakthrough reflux and hoarseness, denies  burning in chest, cough.    BMI is Body mass index is 27.29 kg/m., she has been working on diet and exercise, reports she is very active on her feet, walks 2-3 days weekly with friend. She does admit to drinking 1 small glass of ginger ale daily, may have sweet tea. She reports she  Wt Readings from Last 3 Encounters:  07/25/18 164 lb (74.4 kg)  04/12/18 160 lb 6.4 oz (72.8 kg)  01/08/18 162 lb (73.5 kg)   She does not check her BP at home, today their BP is BP: 130/78 She would like to come off of lisinopril due to persistent dry  cough for years.  She has hx of SVT without any episodes in recent years.   She does workout; steps, daily walking, garden. She denies chest pain, shortness of breath, dizziness.   She is on  cholesterol medication (atorvastatin 80 mg daily)  and denies myalgias. Her cholesterol is at goal. The cholesterol last visit was:   Lab Results  Component Value Date   CHOL 140 04/12/2018   HDL 51 04/12/2018   LDLCALC 71 04/12/2018   TRIG 94 04/12/2018   CHOLHDL 2.7 04/12/2018    She has been working on diet and exercise for prediabetes, and denies increased appetite, nausea, paresthesia of the feet, polydipsia, polyuria, visual disturbances and vomiting. Last A1C in the office was normalized:  Lab Results  Component Value Date   HGBA1C 5.9 (H) 04/12/2018   Patient is on Vitamin D supplement but remained below goal of 60:   Lab Results  Component Value Date   VD25OH 42 04/12/2018        Current Medications:  Current Outpatient Medications on File Prior to Visit  Medication Sig  . amLODipine (NORVASC) 2.5 MG tablet Take 1 tablet daily for  BP  . aspirin EC 81 MG tablet Take 81 mg by mouth daily.  Marland Kitchen atorvastatin (LIPITOR) 80 MG tablet TAKE 1 TABLET (80 MG TOTAL) BY MOUTH DAILY. FOR CHOLESTEROL  . Cholecalciferol 1000 units tablet Take 1,500 Units by mouth daily.  Marland Kitchen gabapentin (NEURONTIN) 100 MG capsule Take 1-3 caps up to three times a day as needed for burning nerve pain. May cause drowsiness.  . meloxicam (MOBIC) 15 MG tablet Take 1/2 to 1 tablet daily with  Food for pain  & Inflammation- try to limit to max 5 days   /week (Patient taking differently: as needed. Take 1/2 to 1 tablet daily with  Food for pain  & Inflammation- try to limit to max 5 days   /week)  . pantoprazole (PROTONIX) 40 MG tablet Take 1 tablet daily at suppertime for Acid Reflux  . PARoxetine (PAXIL) 30 MG tablet Take 1 tablet daily for Mood   No current facility-administered medications on file prior to visit.      Allergies:  Allergies  Allergen Reactions  . Aspirin Other (See Comments)    High Doses stomach trouble  . Codeine Nausea And Vomiting  . Fenofibrate Cough  . Hydrochlorothiazide Other  (See Comments)    Fatigue  . Metoclopramide Other (See Comments)    Sedation     Medical History:  Past Medical History:  Diagnosis Date  . Arthritis   . Colon cancer ([place]) 1990s, 2014  . Colon polyps   . Depression   . Dysrhythmia 04/2012   SVT - RATE 145 POST OP CRANIOTOMY SURGERY --TX'D AND RESOLVED--PT STATES NO FURTHER PROBLEM THAT SHE IS AWARE OF -SHE HAS NOT HAD TO SEE CARDIOLOGIST  . GERD (gastroesophageal reflux disease)   . Headache(784.0)    hx migraines  . Hypertension    pt states she has not taken any b/p medication since her brain surgery in oct 3013--took herself off the medication  . Pituitary mass ([place]) NOV 2013   MASS FOUND AFTER PT EXPERIENCED BILATERAL BLURRED / HAZY VISION -ESP LEFT EYE.  S/P CRANIOTOMY AND EYESIGHT RETURNED TO NORMAL--PT STILL HAS A LOT OF TENDERNESS RIGHT SIDE OF NOSE --THE CRANIOTOMY WAS DONE BY TRANSNASAL APPROACH  . Pneumonia yrs ago   hx of pneumonia  . PONV (postoperative nausea and vomiting)    PT STATES  PLEASE NOTE SHE HAS TENDERNESS RIGHT SIDE OF NOSE - SINCE TRANSNASAL CRANIOTOMY 04/2012  . Prediabetes   . Vitamin D deficiency    Family history- Reviewed and unchanged Social history- Reviewed and unchanged   Review of Systems:  Review of Systems  Constitutional: Negative for malaise/fatigue and weight loss.  HENT: Negative for hearing loss and tinnitus.   Eyes: Positive for blurred vision. Negative for double vision.  Respiratory: Positive for cough. Negative for sputum production, shortness of breath and wheezing.   Cardiovascular: Negative for chest pain, palpitations, orthopnea, claudication and leg swelling.  Gastrointestinal: Negative for abdominal pain, blood in stool, constipation, diarrhea, heartburn, melena, nausea and vomiting.  Genitourinary: Negative.   Musculoskeletal: Negative for joint pain and myalgias.  Skin: Negative for rash.  Neurological: Negative for dizziness, tingling, sensory change, weakness and  headaches.  Endo/Heme/Allergies: Negative for polydipsia.  Psychiatric/Behavioral: Negative.   All other systems reviewed and are negative.   Physical Exam: BP 130/78   Pulse 68   Temp 97.7 F (36.5 C)   Ht 5\' 5"  (1.651 m)   Wt 164 lb (74.4 kg)   SpO2 98%   BMI 27.29 kg/m  Wt Readings from Last 3 Encounters:  07/25/18 164 lb (74.4 kg)  04/12/18 160 lb 6.4 oz (72.8 kg)  01/08/18 162 lb (73.5 kg)   General Appearance: Well nourished, in no apparent distress. Eyes: PERRLA, EOMs, conjunctiva no swelling or erythema Sinuses: No Frontal/maxillary tenderness ENT/Mouth: Ext aud canals clear, TMs without erythema, bulging. No erythema, swelling, or exudate on post pharynx.  Tonsils not swollen or erythematous. Hearing normal.  Neck: Supple, thyroid normal.  Respiratory: Respiratory effort normal, BS equal bilaterally without rales, rhonchi, wheezing or stridor.  Cardio: RRR with 2/6 systolic murmur without radiation. Brisk peripheral pulses without edema.  Abdomen: Soft, + BS.  Non tender, no guarding, rebound, hernias, masses. Lymphatics: Non tender without lymphadenopathy.  Musculoskeletal: Full ROM, 5/5 strength, Normal gait Skin: Warm, dry without rashes, lesions, ecchymosis.  Neuro: Cranial nerves intact. No cerebellar symptoms.  Psych: Awake and oriented X 3, normal affect, Insight and Judgment appropriate.    Izora Ribas, NP 3:50 PM [facility]

## 2018-07-25 ENCOUNTER — Ambulatory Visit (INDEPENDENT_AMBULATORY_CARE_PROVIDER_SITE_OTHER): Payer: PPO | Admitting: Adult Health

## 2018-07-25 ENCOUNTER — Encounter: Payer: Self-pay | Admitting: Adult Health

## 2018-07-25 VITALS — BP 130/78 | HR 68 | Temp 97.7°F | Ht 65.0 in | Wt 164.0 lb

## 2018-07-25 DIAGNOSIS — K219 Gastro-esophageal reflux disease without esophagitis: Secondary | ICD-10-CM | POA: Diagnosis not present

## 2018-07-25 DIAGNOSIS — E782 Mixed hyperlipidemia: Secondary | ICD-10-CM | POA: Diagnosis not present

## 2018-07-25 DIAGNOSIS — Z79899 Other long term (current) drug therapy: Secondary | ICD-10-CM | POA: Diagnosis not present

## 2018-07-25 DIAGNOSIS — R7303 Prediabetes: Secondary | ICD-10-CM | POA: Diagnosis not present

## 2018-07-25 DIAGNOSIS — E559 Vitamin D deficiency, unspecified: Secondary | ICD-10-CM

## 2018-07-25 DIAGNOSIS — F325 Major depressive disorder, single episode, in full remission: Secondary | ICD-10-CM

## 2018-07-25 DIAGNOSIS — I471 Supraventricular tachycardia: Secondary | ICD-10-CM

## 2018-07-25 DIAGNOSIS — E663 Overweight: Secondary | ICD-10-CM

## 2018-07-25 DIAGNOSIS — I10 Essential (primary) hypertension: Secondary | ICD-10-CM

## 2018-07-25 MED ORDER — LOSARTAN POTASSIUM 50 MG PO TABS: ORAL_TABLET | ORAL | 1 refills | Status: DC

## 2018-07-25 NOTE — Patient Instructions (Addendum)
Goals    . Blood Pressure < 130/80    . DIET - INCREASE WATER INTAKE       Try stevia sweetener    STOP lisinopril - start losartan 50 mg (can start with 1/2 tab at first, check blood pressures, increase to full tab only if consistently above 130/80)  Call back with any questions or concerns   Common causes of cough OR hoarseness OR sore throat:   Allergies, Viral Infections, Acid Reflux and Bacterial Infections.   Allergies and viral infections cause a cough OR sore throat by post nasal drip and are often worse at night, can also have sneezing, lower grade fevers, clear/yellow mucus. This is best treated with allergy medications or nasal sprays.  Please get on allegra for 1-2 weeks The strongest is allegra or fexafinadine  Cheapest at [employer], [employer], [employer]   Bacterial infections are more severe than allergies or viral infections with fever, teeth pain, fatigue. This can be treated with prednisone and the same over the counter medication and after 7 days can be treated with an antibiotic.   Silent reflux/GERD can cause a cough OR sore throat OR hoarseness WITHOUT heart burn because the esophagus that goes to the stomach and trachea that goes to the lungs are very close and when you lay down the acid can irritate your throat and lungs. This can cause hoarseness, cough, and wheezing. Please stop any alcohol or anti-inflammatories like aleve/advil/ibuprofen and start an over the counter Prilosec or omeprazole 1-2 times daily 55mins before food for 2 weeks, then switch to over the counter zantac/ratinidine or pepcid/famotadine once at night for 2 weeks.    sometimes irritation causes more irritation. Try voice rest, use sugar free cough drops to prevent coughing, and try to stop clearing your throat.   If you ever have a cough that does not go away after trying these things please make a follow up visit for further evaluation or we can refer you to a specialist. Or if you ever have  shortness of breath or chest pain go to the ER.      Losartan tablets What is this medicine? LOSARTAN (loe SAR tan) is used to treat high blood pressure and to reduce the risk of stroke in certain patients. This drug also slows the progression of kidney disease in patients with diabetes. This medicine may be used for other purposes; ask your health care provider or pharmacist if you have questions. COMMON BRAND NAME(S): Cozaar What should I tell my health care provider before I take this medicine? They need to know if you have any of these conditions: -heart failure -kidney or liver disease -an unusual or allergic reaction to losartan, other medicines, foods, dyes, or preservatives -pregnant or trying to get pregnant -breast-feeding How should I use this medicine? Take this medicine by mouth with a glass of water. Follow the directions on the prescription label. This medicine can be taken with or without food. Take your doses at regular intervals. Do not take your medicine more often than directed. Talk to your pediatrician regarding the use of this medicine in children. Special care may be needed. Overdosage: If you think you have taken too much of this medicine contact a poison control center or emergency room at once. NOTE: This medicine is only for you. Do not share this medicine with others. What if I miss a dose? If you miss a dose, take it as soon as you can. If it is almost time for your  next dose, take only that dose. Do not take double or extra doses. What may interact with this medicine? -blood pressure medicines -diuretics, especially triamterene, spironolactone, or amiloride -fluconazole -NSAIDs, medicines for pain and inflammation, like ibuprofen or naproxen -potassium salts or potassium supplements -rifampin This list may not describe all possible interactions. Give your health care provider a list of all the medicines, herbs, non-prescription drugs, or dietary  supplements you use. Also tell them if you smoke, drink alcohol, or use illegal drugs. Some items may interact with your medicine. What should I watch for while using this medicine? Visit your doctor or health care professional for regular checks on your progress. Check your blood pressure as directed. Ask your doctor or health care professional what your blood pressure should be and when you should contact him or her. Call your doctor or health care professional if you notice an irregular or fast heart beat. Women should inform their doctor if they wish to become pregnant or think they might be pregnant. There is a potential for serious side effects to an unborn child, particularly in the second or third trimester. Talk to your health care professional or pharmacist for more information. You may get drowsy or dizzy. Do not drive, use machinery, or do anything that needs mental alertness until you know how this drug affects you. Do not stand or sit up quickly, especially if you are an older patient. This reduces the risk of dizzy or fainting spells. Alcohol can make you more drowsy and dizzy. Avoid alcoholic drinks. Avoid salt substitutes unless you are told otherwise by your doctor or health care professional. Do not treat yourself for coughs, colds, or pain while you are taking this medicine without asking your doctor or health care professional for advice. Some ingredients may increase your blood pressure. What side effects may I notice from receiving this medicine? Side effects that you should report to your doctor or health care professional as soon as possible: -confusion, dizziness, light headedness or fainting spells -decreased amount of urine passed -difficulty breathing or swallowing, hoarseness, or tightening of the throat -fast or irregular heart beat, palpitations, or chest pain -skin rash, itching -swelling of your face, lips, tongue, hands, or feet Side effects that usually do not  require medical attention (report to your doctor or health care professional if they continue or are bothersome): -cough -decreased sexual function or desire -headache -nasal congestion or stuffiness -nausea or stomach pain -sore or cramping muscles This list may not describe all possible side effects. Call your doctor for medical advice about side effects. You may report side effects to FDA at 1-800-FDA-1088. Where should I keep my medicine? Keep out of the reach of children. Store at room temperature between 15 and 30 degrees C (59 and 86 degrees F). Protect from light. Keep container tightly closed. Throw away any unused medicine after the expiration date. NOTE: This sheet is a summary. It may not cover all possible information. If you have questions about this medicine, talk to your doctor, pharmacist, or health care provider.  2019 Elsevier/[employer] Standard (2007-08-03 16:42:18)

## 2018-07-26 LAB — CBC WITH DIFFERENTIAL/PLATELET
Absolute Monocytes: 350 cells/uL (ref 200–950)
BASOS ABS: 42 {cells}/uL (ref 0–200)
Basophils Relative: 0.6 %
Eosinophils Absolute: 189 cells/uL (ref 15–500)
Eosinophils Relative: 2.7 %
HEMATOCRIT: 35.8 % (ref 35.0–45.0)
Hemoglobin: 12.1 g/dL (ref 11.7–15.5)
Lymphs Abs: 1575 cells/uL (ref 850–3900)
MCH: 29.2 pg (ref 27.0–33.0)
MCHC: 33.8 g/dL (ref 32.0–36.0)
MCV: 86.3 fL (ref 80.0–100.0)
MPV: 11.3 fL (ref 7.5–12.5)
Monocytes Relative: 5 %
NEUTROS ABS: 4844 {cells}/uL (ref 1500–7800)
Neutrophils Relative %: 69.2 %
Platelets: 252 10*3/uL (ref 140–400)
RBC: 4.15 10*6/uL (ref 3.80–5.10)
RDW: 12.8 % (ref 11.0–15.0)
Total Lymphocyte: 22.5 %
WBC: 7 10*3/uL (ref 3.8–10.8)

## 2018-07-26 LAB — COMPLETE METABOLIC PANEL WITHOUT GFR
AG Ratio: 2.2 (calc) (ref 1.0–2.5)
ALT: 16 U/L (ref 6–29)
AST: 16 U/L (ref 10–35)
Albumin: 4.4 g/dL (ref 3.6–5.1)
Alkaline phosphatase (APISO): 72 U/L (ref 37–153)
BUN: 10 mg/dL (ref 7–25)
CO2: 31 mmol/L (ref 20–32)
Calcium: 9.8 mg/dL (ref 8.6–10.4)
Chloride: 104 mmol/L (ref 98–110)
Creat: 0.73 mg/dL (ref 0.60–0.93)
GFR, Est African American: 91 mL/min/1.73m2 (ref >=60)
GFR, Est Non African American: 79 mL/min/1.73m2 (ref >=60)
Globulin: 2 g/dL (ref 1.9–3.7)
Glucose, Bld: 116 mg/dL — ABNORMAL HIGH (ref 65–99)
Potassium: 3.9 mmol/L (ref 3.5–5.3)
Sodium: 140 mmol/L (ref 135–146)
Total Bilirubin: 0.7 mg/dL (ref 0.2–1.2)
Total Protein: 6.4 g/dL (ref 6.1–8.1)

## 2018-07-26 LAB — TSH: TSH: 1.64 mIU/L (ref 0.40–4.50)

## 2018-07-26 LAB — HEMOGLOBIN A1C
Hgb A1c MFr Bld: 5.7 % of total Hgb — ABNORMAL HIGH (ref <5.7)
MEAN PLASMA GLUCOSE: 117 (calc)
eAG (mmol/L): 6.5 (calc)

## 2018-07-26 LAB — LIPID PANEL
Cholesterol: 121 mg/dL (ref <200)
HDL: 49 mg/dL — ABNORMAL LOW (ref >=50)
LDL Cholesterol (Calc): 51 mg/dL (calc)
Non-HDL Cholesterol (Calc): 72 mg/dL (calc) (ref <130)
TRIGLYCERIDES: 133 mg/dL (ref <150)
Total CHOL/HDL Ratio: 2.5 (calc) (ref <5.0)

## 2018-07-26 LAB — MAGNESIUM: Magnesium: 1.9 mg/dL (ref 1.5–2.5)

## 2018-08-17 DIAGNOSIS — Z86010 Personal history of colonic polyps: Secondary | ICD-10-CM | POA: Diagnosis not present

## 2018-08-17 DIAGNOSIS — R197 Diarrhea, unspecified: Secondary | ICD-10-CM | POA: Diagnosis not present

## 2018-10-23 ENCOUNTER — Other Ambulatory Visit: Payer: Self-pay

## 2018-10-23 ENCOUNTER — Encounter: Payer: Self-pay | Admitting: Internal Medicine

## 2018-10-23 ENCOUNTER — Ambulatory Visit: Payer: PPO | Admitting: Internal Medicine

## 2018-10-23 DIAGNOSIS — R69 Illness, unspecified: Secondary | ICD-10-CM

## 2018-10-23 NOTE — Progress Notes (Signed)
RESCHEDULED

## 2018-10-24 ENCOUNTER — Ambulatory Visit: Payer: PPO | Admitting: Internal Medicine

## 2018-10-24 ENCOUNTER — Encounter: Payer: Self-pay | Admitting: Internal Medicine

## 2018-10-24 DIAGNOSIS — I10 Essential (primary) hypertension: Secondary | ICD-10-CM | POA: Diagnosis not present

## 2018-10-24 DIAGNOSIS — E559 Vitamin D deficiency, unspecified: Secondary | ICD-10-CM | POA: Diagnosis not present

## 2018-10-24 DIAGNOSIS — R7309 Other abnormal glucose: Secondary | ICD-10-CM

## 2018-10-24 DIAGNOSIS — E782 Mixed hyperlipidemia: Secondary | ICD-10-CM

## 2018-10-24 DIAGNOSIS — R7303 Prediabetes: Secondary | ICD-10-CM

## 2018-10-24 DIAGNOSIS — Z79899 Other long term (current) drug therapy: Secondary | ICD-10-CM | POA: Diagnosis not present

## 2018-10-24 NOTE — Progress Notes (Signed)
THIS ENCOUNTER IS A VIRTUAL VISIT DUE TO COVID-19 - PATIENT WAS NOT SEEN IN THE OFFICE.  PATIENT HAS CONSENTED TO VIRTUAL VISIT / TELEMEDICINE VISIT  This provider placed a call to [employer] using telephone, her appointment was changed to a virtual office visit to reduce the risk of exposure to the COVID-19 virus and to help [place] remain healthy and safe. The virtual visit will also provide continuity of care. She verbalizes understanding.   Virtual Visit via telephone Note  I connected with  Kristen [name]  on 10/24/18  by telephone.  I verified that I am speaking with the correct person using two identifiers.        I discussed the limitations of evaluation and management by telemedicine and the availability of in person appointments. The patient expressed understanding and agreed to proceed.  History of Present Illness:                                                          This very nice 78 y.o. WWF presents for 6 month follow up with HTN, HLD, Pre-Diabetes and Vitamin D Deficiency. Patient's GERD is controlled on her meds.&                                                      Patient is treated for HTN ((1999). Patient has had no complaints of any cardiac type chest pain, palpitations, dyspnea / orthopnea / PND, dizziness, claudication, or dependent edema.                                                      Hyperlipidemia is controlled with diet & Atorvastatin. Patient denies myalgias or other med SE's. Last Lipids were at goal: Lab Results  Component Value Date   CHOL 121 07/25/2018   HDL 49 (L) 07/25/2018   LDLCALC 51 07/25/2018   TRIG 133 07/25/2018   CHOLHDL 2.5 07/25/2018                                                      Also, the patient has history of PreDiabetes and has had no symptoms of reactive hypoglycemia, diabetic polys, paresthesias or visual blurring.  Last A1c was almost to goal: Lab Results  Component Value Date   HGBA1C 5.7 (H)  07/25/2018                                          Further, the patient also has history of Vitamin D Deficiency  ("31" / 2008) and supplements vitamin D without any suspected side-effects. Last vitamin D was low (goal 70-100): Lab Results  Component Value Date   VD25OH 42 04/12/2018   Current  Outpatient Medications on File Prior to Visit  Medication Sig  . amLODipine (NORVASC) 2.5 MG tablet Take 1 tablet daily for  BP  . aspirin EC 81 MG tablet Take 81 mg by mouth daily.  Marland Kitchen atorvastatin (LIPITOR) 80 MG tablet TAKE 1 TABLET (80 MG TOTAL) BY MOUTH DAILY. FOR CHOLESTEROL  . CHOLECALCIFEROL PO Take 1,600 Units by mouth daily.   Marland Kitchen gabapentin (NEURONTIN) 100 MG capsule Take 1-3 caps up to three times a day as needed for burning nerve pain. May cause drowsiness.  Marland Kitchen losartan (COZAAR) 50 MG tablet Take 1/2-1 tab daily for blood pressure goal of <130/80.  Marland Kitchen meloxicam (MOBIC) 15 MG tablet Take 1/2 to 1 tablet daily with  Food for pain  & Inflammation- try to limit to max 5 days   /week (Patient taking differently: as needed. Take 1/2 to 1 tablet daily with  Food for pain  & Inflammation- try to limit to max 5 days   /week)  . pantoprazole (PROTONIX) 40 MG tablet Take 1 tablet daily at suppertime for Acid Reflux  . PARoxetine (PAXIL) 30 MG tablet Take 1 tablet daily for Mood   No current facility-administered medications on file prior to visit.    Allergies  Allergen Reactions  . Aspirin Other (See Comments)    High Doses stomach trouble  . Codeine Nausea And Vomiting  . Fenofibrate Cough  . Hydrochlorothiazide Other (See Comments)    Fatigue  . Metoclopramide Other (See Comments)    Sedation   PMHx:   Past Medical History:  Diagnosis Date  . Arthritis   . Colon cancer ([place]) 1990s, 2014  . Colon polyps   . Depression   . Dysrhythmia 04/2012   SVT - RATE 145 POST OP CRANIOTOMY SURGERY --TX'D AND RESOLVED--PT STATES NO FURTHER PROBLEM THAT SHE IS AWARE OF -SHE HAS NOT HAD TO SEE  CARDIOLOGIST  . GERD (gastroesophageal reflux disease)   . Headache(784.0)    hx migraines  . Hypertension    pt states she has not taken any b/p medication since her brain surgery in oct 3013--took herself off the medication  . Pituitary mass ([place]) NOV 2013   MASS FOUND AFTER PT EXPERIENCED BILATERAL BLURRED / HAZY VISION -ESP LEFT EYE.  S/P CRANIOTOMY AND EYESIGHT RETURNED TO NORMAL--PT STILL HAS A LOT OF TENDERNESS RIGHT SIDE OF NOSE --THE CRANIOTOMY WAS DONE BY TRANSNASAL APPROACH  . Pneumonia yrs ago   hx of pneumonia  . PONV (postoperative nausea and vomiting)    PT STATES PLEASE NOTE SHE HAS TENDERNESS RIGHT SIDE OF NOSE - SINCE TRANSNASAL CRANIOTOMY 04/2012  . Prediabetes   . Vitamin D deficiency    Immunization History  Administered Date(s) Administered  . Influenza Split 04/19/2012  . Influenza, High Dose Seasonal PF 04/17/2013, 03/12/2014, 03/03/2017, 04/12/2018  . Pneumococcal Conjugate-13 09/30/2014  . Pneumococcal Polysaccharide-23 04/19/2012  . Td 07/20/2005, 12/01/2015   Past Surgical History:  Procedure Laterality Date  . BACK SURGERY     lower  . CATARACT EXTRACTION  June/july 2013   bilateral eyes  . COLON RESECTION  1988  . COLON SURGERY  1990's  . COLONOSCOPY  09/20/2011   Procedure: COLONOSCOPY;  Surgeon: Missy Sabins, MD;  Location: [facility];  Service: Endoscopy;  Laterality: N/A;  . COLONOSCOPY  12/15/2011   Procedure: COLONOSCOPY;  Surgeon: Missy Sabins, MD;  Location: [facility];  Service: Endoscopy;  Laterality: N/A;  . COLONOSCOPY  01/26/2012   Procedure: COLONOSCOPY;  Surgeon: Youlanda Mighty  Buccini, MD;  Location: [facility];  Service: Endoscopy;  Laterality: N/A;  . CRANIOTOMY  04/18/2012   Procedure: CRANIOTOMY HYPOPHYSECTOMY TRANSNASAL APPROACH;  Surgeon: Ophelia Charter, MD;  Location: [facility];  Service: Neurosurgery;  Laterality: Bilateral;  Transphenoidal resection of Pituitary tumor.   . CYSTOSCOPY WITH URETEROSCOPY AND STENT PLACEMENT  Bilateral 10/01/2015   Procedure: CYSTOSCOPY, retrograde WITH URETEROSCOPY AND STENT PLACEMENT, ;  Surgeon: Raynelle Bring, MD;  Location: [facility];  Service: Urology;  Laterality: Bilateral;  . CYSTOSCOPY WITH URETEROSCOPY AND STENT PLACEMENT Right 10/19/2015   Procedure: CYSTOSCOPY,  RIGHT URETEROSCOPY, HOLMIUM LASER RIGHT URETERAL STONE, RIGHT URETERAL STENT PLACEMENT, LEFT URETEROSCOPY, INSERTION LEFT URETERAL STENT;  Surgeon: Raynelle Bring, MD;  Location: [facility];  Service: Urology;  Laterality: Right;  . EYE SURGERY     cataract bil  . HOLMIUM LASER APPLICATION Right 6/78/9381   Procedure: HOLMIUM LASER  ;  Surgeon: Raynelle Bring, MD;  Location: [facility];  Service: Urology;  Laterality: Right;  . HOT HEMOSTASIS  09/20/2011   Procedure: HOT HEMOSTASIS (ARGON PLASMA COAGULATION/BICAP);  Surgeon: Missy Sabins, MD;  Location: [facility];  Service: Endoscopy;  Laterality: N/A;  . LAPAROSCOPIC RIGHT HEMI COLECTOMY  06/27/2012   Procedure: LAPAROSCOPIC RIGHT HEMI COLECTOMY;  Surgeon: Pedro Earls, MD;  Location: [facility];  Service: General;  Laterality: N/A;  Laparoscopic Assited Right Hemicolectomy  . TRANSNASAL APPROACH  04/18/2012   Procedure: TRANSNASAL APPROACH;  Surgeon: Rozetta Nunnery, MD;  Location: [facility];  Service: ENT;  Laterality: Bilateral;  Transnasal approach  . TUMOR REMOVAL     Left leg   FHx:    Reviewed / unchanged SHx:    Reviewed / unchanged   Systems Review:  Constitutional: Denies fever, chills, wt changes, headaches, insomnia, fatigue, night sweats, change in appetite. Eyes: Denies redness, blurred vision, diplopia, discharge, itchy, watery eyes.  ENT: Denies discharge, congestion, post nasal drip, epistaxis, sore throat, earache, hearing loss, dental pain, tinnitus, vertigo, sinus pain, snoring.  CV: Denies chest pain, palpitations, irregular heartbeat, syncope, dyspnea, diaphoresis, orthopnea, PND, claudication or edema. Respiratory: denies cough, dyspnea,  DOE, pleurisy, hoarseness, laryngitis, wheezing.  Gastrointestinal: Denies dysphagia, odynophagia, heartburn, reflux, water brash, abdominal pain or cramps, nausea, vomiting, bloating, diarrhea, constipation, hematemesis, melena, hematochezia  or hemorrhoids. Genitourinary: Denies dysuria, frequency, urgency, nocturia, hesitancy, discharge, hematuria or flank pain. Musculoskeletal: Denies arthralgias, myalgias, stiffness, jt. swelling, pain, limping or strain/sprain.  Skin: Denies pruritus, rash, hives, warts, acne, eczema or change in skin lesion(s). Neuro: No weakness, tremor, incoordination, spasms, paresthesia or pain. Psychiatric: Denies confusion, memory loss or sensory loss. Endo: Denies change in weight, skin or hair change.  Heme/Lymph: No excessive bleeding, bruising or enlarged lymph nodes.  Physical Exam  General : Well sounding patient in no apparent distress HEENT: no hoarseness, no cough for duration of visit Lungs: speaks in complete sentences, no audible wheezing, no apparent distress Neurological: alert, oriented x 3 Psychiatric: pleasant, judgement appropriate   Assessment and Plan:  1. Essential hypertension  - Continue medication, monitor blood pressure at home.  - Continue DASH diet.  Reminder to go to the ER if any CP,  SOB, nausea, dizziness, severe HA, changes vision/speech.  2. Hyperlipidemia, mixed  - Continue diet/meds, exercise,& lifestyle modifications.  - Continue monitor periodic cholesterol/liver & renal functions   3. Abnormal glucose  - Continue diet, exercise  - Lifestyle modifications.  - Monitor appropriate labs.  4. Vitamin D deficiency  - Continue supplementation.  5. Prediabetes  - Continue diet, exercise  - Lifestyle modifications.  - Monitor appropriate labs.  6. Medication management        Discussed  regular exercise, BP monitoring, weight control to achieve/maintain BMI less than 25 and discussed med and SE's. Deferred  labs til next OV since patient doing well. I discussed the assessment and treatment plan with the patient. The patient was provided an opportunity to ask questions and all were answered. The patient agreed with the plan and demonstrated an understanding of the instructions. I provided 18 minutes of non-face-to-face time during this encounter and over 26 minutes of virtual exam / interview, counseling, chart review and  complex critical decision making was performed   Kirtland Bouchard, MD

## 2018-10-24 NOTE — Patient Instructions (Signed)
Coronavirus (COVID-19) Are you at risk?  Are you at risk for the Coronavirus (COVID-19)?  To be considered HIGH RISK for Coronavirus (COVID-19), you have to meet the following criteria:  . Traveled to [place], [place], [place], [place] or [place]; or in the [place] to [place], [place], [place]  . or [place]; and have fever, cough, and shortness of breath within the last 2 weeks of travel OR . Been in close contact with a person diagnosed with COVID-19 within the last 2 weeks and have  . fever, cough,and shortness of breath .  . IF YOU DO NOT MEET THESE CRITERIA, YOU ARE CONSIDERED LOW RISK FOR COVID-19.  What to do if you are HIGH RISK for COVID-19?  . If you are having a medical emergency, call 911. . Seek medical care right away. Before you go to a doctor's office, urgent care or emergency department, .  call ahead and tell them about your recent travel, contact with someone diagnosed with COVID-19  .  and your symptoms.  . You should receive instructions from your physician's office regarding next steps of care.  . When you arrive at healthcare provider, tell the healthcare staff immediately you have returned from  . visiting [place], [place], [place], [place] or [place]; or traveled in the [place] to [place], [place],  . [place] or [place] in the last two weeks or you have been in close contact with a person diagnosed with  . COVID-19 in the last 2 weeks.   . Tell the health care staff about your symptoms: fever, cough and shortness of breath. . After you have been seen by a medical provider, you will be either: o Tested for (COVID-19) and discharged home on quarantine except to seek medical care if  o symptoms worsen, and asked to  - Stay home and avoid contact with others until you get your results (4-5 days)  - Avoid travel on public transportation if possible (such as bus, train, or airplane) or o Sent to the Emergency Department by EMS for evaluation,  COVID-19 testing  and  o possible admission depending on your condition and test results.  What to do if you are LOW RISK for COVID-19?  Reduce your risk of any infection by using the same precautions used for avoiding the common cold or flu:  . Wash your hands often with soap and warm water for at least 20 seconds.  If soap and water are not readily available,  . use an alcohol-based hand sanitizer with at least 60% alcohol.  . If coughing or sneezing, cover your mouth and nose by coughing or sneezing into the elbow areas of your shirt or coat, .  into a tissue or into your sleeve (not your hands). . Avoid shaking hands with others and consider head nods or verbal greetings only. . Avoid touching your eyes, nose, or mouth with unwashed hands.  . Avoid close contact with people who are sick. . Avoid places or events with large numbers of people in one location, like concerts or sporting events. . Carefully consider travel plans you have or are making. . If you are planning any travel outside or inside the [place], visit the CDC's Travelers' Health webpage for the latest health notices. . If you have some symptoms but not all symptoms, continue to monitor at home and seek medical attention  . if your symptoms worsen. . If you are having a medical emergency, call 911.   ++++++++++++++++++++++++++++++++   Recommend Adult Low Dose Aspirin or  coated  Aspirin 81 mg daily  To reduce risk of Colon Cancer 20 %,  Skin Cancer 26 % ,  Melanoma 46%  and  Pancreatic cancer 60% ++++++++++++++++++++++++++++++++ Vitamin D goal  is between 70-100.  Please make sure that you are taking your Vitamin D as directed.  It is very important as a natural anti-inflammatory  helping hair, skin, and nails, as well as reducing stroke and heart attack risk.  It helps your bones and helps with mood. It also decreases numerous cancer risks so please take it as directed.  Low Vit D is associated with a 200-300% higher  risk for CANCER  and 200-300% higher risk for HEART   ATTACK  &  STROKE.   ...................................... It is also associated with higher death rate at younger ages,  autoimmune diseases like Rheumatoid arthritis, Lupus, Multiple Sclerosis.    Also many other serious conditions, like depression, Alzheimer's Dementia, infertility, muscle aches, fatigue, fibromyalgia - just to name a few. ++++++++++++++++++++ Recommend the book "The END of DIETING" by Dr Joel Fuhrman  & the book "The END of DIABETES " by Dr Joel Fuhrman At [employer] - get book & Audio CD's    Being diabetic has a  300% increased risk for heart attack, stroke, cancer, and alzheimer- type vascular dementia. It is very important that you work harder with diet by avoiding all foods that are white. Avoid white rice ([place] & wild rice is OK), white potatoes (sweetpotatoes in moderation is OK), White bread or wheat bread or anything made out of white flour like bagels, donuts, rolls, buns, biscuits, cakes, pastries, cookies, pizza crust, and pasta (made from white flour & egg whites) - vegetarian pasta or spinach or wheat pasta is OK. Multigrain breads like Arnold's or Pepperidge Farm, or multigrain sandwich thins or flatbreads.  Diet, exercise and weight loss can reverse and cure diabetes in the early stages.  Diet, exercise and weight loss is very important in the control and prevention of complications of diabetes which affects every system in your body, ie. Brain - dementia/stroke, eyes - glaucoma/blindness, heart - heart attack/heart failure, kidneys - dialysis, stomach - gastric paralysis, intestines - malabsorption, nerves - severe painful neuritis, circulation - gangrene & loss of a leg(s), and finally cancer and Alzheimers.    I recommend avoid fried & greasy foods,  sweets/candy, white rice ([place] or wild rice or Quinoa is OK), white potatoes (sweet potatoes are OK) - anything made from white flour - bagels, doughnuts,  rolls, buns, biscuits,white and wheat breads, pizza crust and traditional pasta made of white flour & egg white(vegetarian pasta or spinach or wheat pasta is OK).  Multi-grain bread is OK - like multi-grain flat bread or sandwich thins. Avoid alcohol in excess. Exercise is also important.    Eat all the vegetables you want - avoid meat, especially red meat and dairy - especially cheese.  Cheese is the most concentrated form of trans-fats which is the worst thing to clog up our arteries. Veggie cheese is OK which can be found in the fresh produce section at [employer] or [employer] or [employer]  +++++++++++++++++++++ DASH Eating Plan  DASH stands for "Dietary Approaches to Stop Hypertension."   The DASH eating plan is a healthy eating plan that has been shown to reduce high blood pressure (hypertension). Additional health benefits may include reducing the risk of type 2 diabetes mellitus, heart disease, and stroke. The DASH eating plan may   also help with weight loss. WHAT DO I NEED TO KNOW ABOUT THE DASH EATING PLAN? For the DASH eating plan, you will follow these general guidelines:  Choose foods with a percent daily value for sodium of less than 5% (as listed on the food label).  Use salt-free seasonings or herbs instead of table salt or sea salt.  Check with your health care provider or pharmacist before using salt substitutes.  Eat lower-sodium products, often labeled as "lower sodium" or "no salt added."  Eat fresh foods.  Eat more vegetables, fruits, and low-fat dairy products.  Choose whole grains. Look for the word "whole" as the first word in the ingredient list.  Choose fish   Limit sweets, desserts, sugars, and sugary drinks.  Choose heart-healthy fats.  Eat veggie cheese   Eat more home-cooked food and less restaurant, buffet, and fast food.  Limit fried foods.  Cook foods using methods other than frying.  Limit canned vegetables. If you do use them, rinse them  well to decrease the sodium.  When eating at a restaurant, ask that your food be prepared with less salt, or no salt if possible.                      WHAT FOODS CAN I EAT? Read Dr Joel Fuhrman's books on The End of Dieting & The End of Diabetes  Grains Whole grain or whole wheat bread. Howze rice. Whole grain or whole wheat pasta. Quinoa, bulgur, and whole grain cereals. Low-sodium cereals. Corn or whole wheat flour tortillas. Whole grain cornbread. Whole grain crackers. Low-sodium crackers.  Vegetables Fresh or frozen vegetables (raw, steamed, roasted, or grilled). Low-sodium or reduced-sodium tomato and vegetable juices. Low-sodium or reduced-sodium tomato sauce and paste. Low-sodium or reduced-sodium canned vegetables.   Fruits All fresh, canned (in natural juice), or frozen fruits.  Protein Products  All fish and seafood.  Dried beans, peas, or lentils. Unsalted nuts and seeds. Unsalted canned beans.  Dairy Low-fat dairy products, such as skim or 1% milk, 2% or reduced-fat cheeses, low-fat ricotta or cottage cheese, or plain low-fat yogurt. Low-sodium or reduced-sodium cheeses.  Fats and Oils Tub margarines without trans fats. Light or reduced-fat mayonnaise and salad dressings (reduced sodium). Avocado. Safflower, olive, or canola oils. Natural peanut or almond butter.  Other Unsalted popcorn and pretzels. The items listed above may not be a complete list of recommended foods or beverages. Contact your dietitian for more options.  +++++++++++++++  WHAT FOODS ARE NOT RECOMMENDED? Grains/ White flour or wheat flour White bread. White pasta. White rice. Refined cornbread. Bagels and croissants. Crackers that contain trans fat.  Vegetables  Creamed or fried vegetables. Vegetables in a . Regular canned vegetables. Regular canned tomato sauce and paste. Regular tomato and vegetable juices.  Fruits Dried fruits. Canned fruit in light or heavy syrup. Fruit juice.  Meat and  Other Protein Products Meat in general - RED meat & White meat.  Fatty cuts of meat. Ribs, chicken wings, all processed meats as bacon, sausage, bologna, salami, fatback, hot dogs, bratwurst and packaged luncheon meats.  Dairy Whole or 2% milk, cream, half-and-half, and cream cheese. Whole-fat or sweetened yogurt. Full-fat cheeses or blue cheese. Non-dairy creamers and whipped toppings. Processed cheese, cheese spreads, or cheese curds.  Condiments Onion and garlic salt, seasoned salt, table salt, and sea salt. Canned and packaged gravies. Worcestershire sauce. Tartar sauce. Barbecue sauce. Teriyaki sauce. Soy sauce, including reduced sodium. Steak sauce. Fish sauce. Oyster sauce.   Cocktail sauce. Horseradish. Ketchup and mustard. Meat flavorings and tenderizers. Bouillon cubes. Hot sauce. Tabasco sauce. Marinades. Taco seasonings. Relishes.  Fats and Oils Butter, stick margarine, lard, shortening and bacon fat. Coconut, palm kernel, or palm oils. Regular salad dressings.  Pickles and olives. Salted popcorn and pretzels.  The items listed above may not be a complete list of foods and beverages to avoid.   

## 2018-11-03 ENCOUNTER — Other Ambulatory Visit: Payer: Self-pay | Admitting: Internal Medicine

## 2018-11-03 DIAGNOSIS — F325 Major depressive disorder, single episode, in full remission: Secondary | ICD-10-CM

## 2018-11-03 DIAGNOSIS — K219 Gastro-esophageal reflux disease without esophagitis: Secondary | ICD-10-CM

## 2019-01-14 ENCOUNTER — Ambulatory Visit: Payer: Self-pay | Admitting: Adult Health

## 2019-01-14 ENCOUNTER — Emergency Department (HOSPITAL_COMMUNITY)
Admission: EM | Admit: 2019-01-14 | Discharge: 2019-01-14 | Disposition: A | Discharge status: Home / Self Care | Payer: PPO | Attending: Emergency Medicine | Admitting: Emergency Medicine

## 2019-01-14 ENCOUNTER — Emergency Department (HOSPITAL_COMMUNITY): Payer: PPO

## 2019-01-14 ENCOUNTER — Encounter (HOSPITAL_COMMUNITY): Payer: Self-pay | Admitting: Emergency Medicine

## 2019-01-14 ENCOUNTER — Other Ambulatory Visit: Payer: Self-pay

## 2019-01-14 DIAGNOSIS — Z79899 Other long term (current) drug therapy: Secondary | ICD-10-CM | POA: Diagnosis not present

## 2019-01-14 DIAGNOSIS — S161XXA Strain of muscle, fascia and tendon at neck level, initial encounter: Secondary | ICD-10-CM | POA: Insufficient documentation

## 2019-01-14 DIAGNOSIS — W010XXA Fall on same level from slipping, tripping and stumbling without subsequent striking against object, initial encounter: Secondary | ICD-10-CM | POA: Insufficient documentation

## 2019-01-14 DIAGNOSIS — Y92007 Garden or yard of unspecified non-institutional (private) residence as the place of occurrence of the external cause: Secondary | ICD-10-CM | POA: Insufficient documentation

## 2019-01-14 DIAGNOSIS — S139XXA Sprain of joints and ligaments of unspecified parts of neck, initial encounter: Secondary | ICD-10-CM

## 2019-01-14 DIAGNOSIS — Y998 Other external cause status: Secondary | ICD-10-CM | POA: Diagnosis not present

## 2019-01-14 DIAGNOSIS — M542 Cervicalgia: Secondary | ICD-10-CM | POA: Diagnosis not present

## 2019-01-14 DIAGNOSIS — M25511 Pain in right shoulder: Secondary | ICD-10-CM

## 2019-01-14 DIAGNOSIS — S4991XA Unspecified injury of right shoulder and upper arm, initial encounter: Secondary | ICD-10-CM | POA: Diagnosis present

## 2019-01-14 DIAGNOSIS — I10 Essential (primary) hypertension: Secondary | ICD-10-CM | POA: Insufficient documentation

## 2019-01-14 DIAGNOSIS — W19XXXA Unspecified fall, initial encounter: Secondary | ICD-10-CM

## 2019-01-14 DIAGNOSIS — Y9389 Activity, other specified: Secondary | ICD-10-CM | POA: Insufficient documentation

## 2019-01-14 MED ORDER — METHOCARBAMOL 500 MG PO TABS
500.0000 mg | ORAL_TABLET | Freq: Once | ORAL | Status: AC
  Administered 2019-01-14: 20:00:00 500 mg via ORAL
  Filled 2019-01-14: qty 1

## 2019-01-14 MED ORDER — LIDOCAINE 5 % EX PTCH: 1.0000 | MEDICATED_PATCH | CUTANEOUS | 0 refills | Status: DC

## 2019-01-14 MED ORDER — METHOCARBAMOL 500 MG PO TABS: 500.0000 mg | ORAL_TABLET | Freq: Two times a day (BID) | ORAL | 0 refills | Status: DC

## 2019-01-14 NOTE — ED Triage Notes (Signed)
Patient states she fell in the garden a week and a half ago. C/O R shoulder and neck pain that has not improved.

## 2019-01-14 NOTE — Discharge Instructions (Signed)
Take the medications as prescribed.  Do not drive or operate heavy machinery while taking the muscle relaxer.  Follow-up with orthopedic surgery if your pain does not improve after 1 week.  Return to the ED for any new worsening symptoms.  Follow-up with PCP for reevaluation of your blood pressure.

## 2019-01-14 NOTE — ED Provider Notes (Signed)
[facility] EMERGENCY DEPARTMENT Provider Note   CSN: [id number] Arrival date & time: 01/14/19  1836   History   Chief Complaint Chief Complaint  Patient presents with   Fall    HPI Kristen [name] is a 78 y.o. female with past medical history significant for SVT, GERD, headache, hypertension, arthritis who presents for evaluation after a fall.  Patient states approximately 10 days ago she was in her garden when she leaned forward causing her to fall.  Patient states she fell on her right shoulder.  She denies hitting her head or LOC.  She denies anticoagulation.  Patient states she has had right-sided neck pain since the incident.  Denies headache, blurred vision, dizziness, decreased range of motion, numbness or tingling in her extremities, redness, swelling, warmth, chest pain or shortness of breath.  Patient denies preceding aura, chest pain prior to falling.  Has been taking Tylenol intermittently for her pain.  Patient states that she places pressure to her right shoulder if this helps with the pain.  Denies any additional aggravating or alleviating factors.  History obtained from patient and past medical records.  No interpreter is used.     HPI  Past Medical History:  Diagnosis Date   Arthritis    Colon cancer ([place]) 1990s, 2014   Colon polyps    Depression    Dysrhythmia 04/2012   SVT - RATE 145 POST OP CRANIOTOMY SURGERY --TX'D AND RESOLVED--PT STATES NO FURTHER PROBLEM THAT SHE IS AWARE OF -SHE HAS NOT HAD TO SEE CARDIOLOGIST   GERD (gastroesophageal reflux disease)    Headache(784.0)    hx migraines   Hypertension    pt states she has not taken any b/p medication since her brain surgery in oct 3013--took herself off the medication   Pituitary mass ([place]) NOV 2013   MASS FOUND AFTER PT EXPERIENCED BILATERAL BLURRED / HAZY VISION -ESP LEFT EYE.  S/P CRANIOTOMY AND EYESIGHT RETURNED TO NORMAL--PT STILL HAS A LOT OF TENDERNESS RIGHT SIDE OF NOSE --THE CRANIOTOMY  WAS DONE BY TRANSNASAL APPROACH   Pneumonia yrs ago   hx of pneumonia   PONV (postoperative nausea and vomiting)    PT STATES PLEASE NOTE SHE HAS TENDERNESS RIGHT SIDE OF NOSE - SINCE TRANSNASAL CRANIOTOMY 04/2012   Prediabetes    Vitamin D deficiency     Patient Active Problem List   Diagnosis Date Noted   Hypertension 08/19/2014   Pituitary tumor (excised 2013) 03/12/2014   Medication management 09/10/2013   Hyperlipidemia 06/11/2013   Vitamin D deficiency    Prediabetes    History of colon cancer, stage II 10/12/2012   Overweight (BMI 25.0-29.9) 07/01/2012   GERD (gastroesophageal reflux disease)    Major depression in remission ([place])    SVT (supraventricular tachycardia) ([place]) 04/21/2012    Past Surgical History:  Procedure Laterality Date   BACK SURGERY     lower   CATARACT EXTRACTION  June/july 2013   bilateral eyes   COLON RESECTION  1988   COLON SURGERY  1990's   COLONOSCOPY  09/20/2011   Procedure: COLONOSCOPY;  Surgeon: Missy Sabins, MD;  Location: [facility];  Service: Endoscopy;  Laterality: N/A;   COLONOSCOPY  12/15/2011   Procedure: COLONOSCOPY;  Surgeon: Missy Sabins, MD;  Location: [facility];  Service: Endoscopy;  Laterality: N/A;   COLONOSCOPY  01/26/2012   Procedure: COLONOSCOPY;  Surgeon: Cleotis Nipper, MD;  Location: [facility];  Service: Endoscopy;  Laterality: N/A;   CRANIOTOMY  04/18/2012  Procedure: CRANIOTOMY HYPOPHYSECTOMY TRANSNASAL APPROACH;  Surgeon: Ophelia Charter, MD;  Location: [facility];  Service: Neurosurgery;  Laterality: Bilateral;  Transphenoidal resection of Pituitary tumor.    CYSTOSCOPY WITH URETEROSCOPY AND STENT PLACEMENT Bilateral 10/01/2015   Procedure: CYSTOSCOPY, retrograde WITH URETEROSCOPY AND STENT PLACEMENT, ;  Surgeon: Raynelle Bring, MD;  Location: [facility];  Service: Urology;  Laterality: Bilateral;   CYSTOSCOPY WITH URETEROSCOPY AND STENT PLACEMENT Right 10/19/2015   Procedure:  CYSTOSCOPY,  RIGHT URETEROSCOPY, HOLMIUM LASER RIGHT URETERAL STONE, RIGHT URETERAL STENT PLACEMENT, LEFT URETEROSCOPY, INSERTION LEFT URETERAL STENT;  Surgeon: Raynelle Bring, MD;  Location: [facility];  Service: Urology;  Laterality: Right;   EYE SURGERY     cataract bil   HOLMIUM LASER APPLICATION Right 6/59/9357   Procedure: HOLMIUM LASER  ;  Surgeon: Raynelle Bring, MD;  Location: [facility];  Service: Urology;  Laterality: Right;   HOT HEMOSTASIS  09/20/2011   Procedure: HOT HEMOSTASIS (ARGON PLASMA COAGULATION/BICAP);  Surgeon: Missy Sabins, MD;  Location: [facility];  Service: Endoscopy;  Laterality: N/A;   LAPAROSCOPIC RIGHT HEMI COLECTOMY  06/27/2012   Procedure: LAPAROSCOPIC RIGHT HEMI COLECTOMY;  Surgeon: Pedro Earls, MD;  Location: [facility];  Service: General;  Laterality: N/A;  Laparoscopic Assited Right Hemicolectomy   TRANSNASAL APPROACH  04/18/2012   Procedure: TRANSNASAL APPROACH;  Surgeon: Rozetta Nunnery, MD;  Location: [facility];  Service: ENT;  Laterality: Bilateral;  Transnasal approach   TUMOR REMOVAL     Left leg     OB History    Gravida  4   Para  3   Term  3   Preterm      AB  1   Living        SAB  1   TAB      Ectopic      Multiple      Live Births               Home Medications    Prior to Admission medications   Medication Sig Start Date End Date Taking? Authorizing Provider  amLODipine (NORVASC) 2.5 MG tablet Take 1 tablet daily for  BP 07/13/18   Unk Pinto, MD  aspirin EC 81 MG tablet Take 81 mg by mouth daily.    [provider]  atorvastatin (LIPITOR) 80 MG tablet TAKE 1 TABLET (80 MG TOTAL) BY MOUTH DAILY. FOR CHOLESTEROL 07/14/18   Liane Comber, NP  CHOLECALCIFEROL PO Take 1,600 Units by mouth daily.     [provider]  gabapentin (NEURONTIN) 100 MG capsule Take 1-3 caps up to three times a day as needed for burning nerve pain. May cause drowsiness. 07/13/18   Unk Pinto, MD  lidocaine  (LIDODERM) 5 % Place 1 patch onto the skin daily. Remove & Discard patch within 12 hours or as directed by MD 01/14/19   Dysen Edmondson A, PA-C  losartan (COZAAR) 50 MG tablet Take 1/2-1 tab daily for blood pressure goal of <130/80. 07/25/18   Liane Comber, NP  meloxicam (MOBIC) 15 MG tablet Take 1/2 to 1 tablet daily with  Food for pain  & Inflammation- try to limit to max 5 days   /week Patient taking differently: as needed. Take 1/2 to 1 tablet daily with  Food for pain  & Inflammation- try to limit to max 5 days   /week 07/13/18   Unk Pinto, MD  methocarbamol (ROBAXIN) 500 MG tablet Take 1 tablet (500 mg total) by mouth  2 (two) times daily. 01/14/19   Iyanah Demont A, PA-C  pantoprazole (PROTONIX) 40 MG tablet Take 1 tablet Daily for Acid Indigestion & Reflux 11/03/18   Unk Pinto, MD  PARoxetine (PAXIL) 30 MG tablet Take 1 tablet Daily for Mood 11/03/18   Unk Pinto, MD    Family History Family History  Problem Relation Age of Onset   Colon cancer Sister 83   Heart disease Mother    Heart disease Father    Cancer Maternal Aunt        unknown form of cancer dx in her 60s-50s   Liver cancer Maternal Uncle        heavy ETOH user   Breast cancer Sister 49   Dementia Sister    Colon cancer Cousin    Colon polyps Son    Colon polyps Son    Colon cancer Other 39   Colon polyps Other    Brain cancer Cousin        dx in his 75s; paternal cousin    Social History Social History   Tobacco Use   Smoking status: Never Smoker   Smokeless tobacco: Never Used  Substance Use Topics   Alcohol use: No    Comment: a couple times a year, one drink at a time   Drug use: No     Allergies   Aspirin, Codeine, Fenofibrate, Hydrochlorothiazide, and Metoclopramide   Review of Systems Review of Systems  Constitutional: Negative.   HENT: Negative.   Eyes: Negative.   Respiratory: Negative.   Cardiovascular: Negative.   Gastrointestinal: Negative.     Genitourinary: Negative.   Musculoskeletal: Positive for neck pain. Negative for arthralgias, back pain, gait problem, joint swelling, myalgias and neck stiffness.       Right shoulder pain  Skin: Negative.   Neurological: Negative.   All other systems reviewed and are negative.  Physical Exam Updated Vital Signs BP (!) 186/76 (BP Location: Right Arm)    Pulse 60    Temp 98 F (36.7 C) (Oral)    Resp 20    Ht 5\' 5"  (1.651 m)    Wt 74.8 kg    SpO2 97%    BMI 27.46 kg/m   Physical Exam  Physical Exam  Constitutional: Pt is oriented to person, place, and time. Appears well-developed and well-nourished. No distress.  HENT:  Head: Normocephalic and atraumatic.  Nose: Nose normal.  Mouth/Throat: Uvula is midline, oropharynx is clear and moist and mucous membranes are normal.  Eyes: Conjunctivae and EOM are normal. Pupils are equal, round, and reactive to light.  Neck: Tenderness to cervical spine as well as right paraspinal muscle tenderness over trapezius with palpable spasm. Cardiovascular: Normal rate, regular rhythm and intact distal pulses.   Pulses:      Radial pulses are 2+ on the right side, and 2+ on the left side.       Dorsalis pedis pulses are 2+ on the right side, and 2+ on the left side.       Posterior tibial pulses are 2+ on the right side, and 2+ on the left side.  Pulmonary/Chest: Effort normal and breath sounds normal. No accessory muscle usage. No respiratory distress. No decreased breath sounds. No wheezes. No rhonchi. No rales. Exhibits no tenderness and no bony tenderness.  Equal chest expansion  Abdominal: Soft. Normal appearance and bowel sounds are normal. There is no tenderness. There is no rigidity, no guarding and no CVA tenderness.  Abd soft and nontender  Musculoskeletal: Normal range of motion.       Thoracic back: Exhibits normal range of motion.       Lumbar back: Exhibits normal range of motion.  Full range of motion of the T-spine and L-spine No  tenderness to palpation of the spinous processes of the T-spine or L-spine No crepitus, deformity or step-offs No tenderness to palpation of the paraspinous muscles of the L-spine  Tenderness palpation to right posterior shoulder.  Negative Hawkins, empty can.  Full range of motion with flexion, extension, abduction and abduction without difficulty. Lymphadenopathy:    Pt has no cervical adenopathy.  Neurological: Pt is alert and oriented to person, place, and time. Normal reflexes. No cranial nerve deficit. GCS eye subscore is 4. GCS verbal subscore is 5. GCS motor subscore is 6.  Reflex Scores:      Bicep reflexes are 2+ on the right side and 2+ on the left side.      Brachioradialis reflexes are 2+ on the right side and 2+ on the left side.      Patellar reflexes are 2+ on the right side and 2+ on the left side.      Achilles reflexes are 2+ on the right side and 2+ on the left side. Speech is clear and goal oriented, follows commands Normal 5/5 strength in upper and lower extremities bilaterally including dorsiflexion and plantar flexion, strong and equal grip strength Sensation normal to light and sharp touch Moves extremities without ataxia, coordination intact Normal gait and balance No Clonus  Skin: Skin is warm and dry. No rash noted. Pt is not diaphoretic. No erythema.  No edema, erythema, ecchymosis or warmth.  Compartments soft. Psychiatric: Normal mood and affect.  Nursing note and vitals reviewed. ED Treatments / Results  Labs (all labs ordered are listed, but only abnormal results are displayed) Labs Reviewed - No data to display  EKG None  Radiology Dg Shoulder Right  Result Date: 01/14/2019 CLINICAL DATA:  Fall.  Right shoulder pain EXAM: RIGHT SHOULDER - 2+ VIEW COMPARISON:  04/24/2011 FINDINGS: There is no evidence of fracture or dislocation. There is no evidence of arthropathy or other focal bone abnormality. Soft tissues are unremarkable. IMPRESSION: Negative.  Electronically Signed   By: Franchot Gallo M.D.   On: 01/14/2019 19:42   Ct Cervical Spine Wo Contrast  Result Date: 01/14/2019 CLINICAL DATA:  Patient states she fell in the garden a week and a half ago. C/O R shoulder and rt side and posterior neck pain that has not improved EXAM: CT CERVICAL SPINE WITHOUT CONTRAST TECHNIQUE: Multidetector CT imaging of the cervical spine was performed without intravenous contrast. Multiplanar CT image reconstructions were also generated. COMPARISON:  None. FINDINGS: Alignment: Slight reversal the normal cervical lordosis centered at C5-C6. No spondylolisthesis. Skull base and vertebrae: No acute fracture. No primary bone lesion or focal pathologic process. Soft tissues and spinal canal: No prevertebral fluid or swelling. No visible canal hematoma. Disc levels: Mild-to-moderate loss of disc height at C3-C4. Mild loss of disc height at C4-C5. Moderate loss of disc height at C5-C6 and C6-C7. There is spondylotic disc bulging and endplate spurring at these levels, greatest at C5-C6 and C6-C7. Disc bulging narrows the central spinal canal greatest at C5-C6 and C6-C7, to a minimum of 6 mm anterior to posterior. No convincing disc herniation. Uncovertebral spurring needs to mild neural foraminal narrowing, most evident on right at C5-C6 and C6-C7 Upper chest: No acute findings.  Clear lung apices. Other: None. IMPRESSION:  1. No fracture or acute finding. 2. Degenerative changes as described, including significant central spinal stenosis due to degenerative spondylotic disc bulging at C5-C6 and C6-C7. Electronically Signed   By: Lajean Manes M.D.   On: 01/14/2019 19:33    Procedures Procedures (including critical care time)  Medications Ordered in ED Medications  methocarbamol (ROBAXIN) tablet 500 mg (500 mg Oral Given 01/14/19 1939)   Initial Impression / Assessment and Plan / ED Course  I have reviewed the triage vital signs and the nursing notes.  Pertinent labs &  imaging results that were available during my care of the patient were reviewed by me and considered in my medical decision making (see chart for details).  78 year old female appears otherwise well presents for evaluation after mechanical fall which occurred 10 days ago.  Patient with persistent neck pain and right posterior shoulder pain after the incident.  No radicular symptoms.  Denies hitting head, LOC or anticoagulation.  Neuromusculoskeletal exam however patient with palpable spasm to right paraspinal muscle and cervical spine as well as right posterior shoulder.  Negative Hawkins, empty can.  No neck stiffness or neck rigidity.  Neurovascularly intact without neurologic deficits.  Given persistent symptoms will obtain CT cervical spine plain film right shoulder and reevaluate.  Given palpable spasm high suspicion for this as cause of pain.  Imaging reviewed: CT cervical: degenerative changes with disc bulge.  No acute findings. DG right shoulder: No acute findings, no fracture, dislocation or effusion   Patient likely with MSK sprain or strain.  Low suspicion for septic joint, gout, hemarthrosis, fracture, dislocation, spinal cord injury, DVT, compartment syndrome.  To follow-up with orthopedics if she continues have symptoms.  Discussed RICE for symptomatic management.  Patient with mildly elevated blood pressure in department.  History of hypertension.  Low suspicion for hypertensive urgency or emergency as she is without headache, nausea, vomiting, chest pain, shortness of breath or abdominal pain.  Patient to follow-up with PCP for reevaluation.  The patient has been appropriately medically screened and/or stabilized in the ED. I have low suspicion for any other emergent medical condition which would require further screening, evaluation or treatment in the ED or require inpatient management.       Final Clinical Impressions(s) / ED Diagnoses   Final diagnoses:  Fall, initial  encounter  Acute pain of right shoulder  Neck sprain, initial encounter    ED Discharge Orders         Ordered    methocarbamol (ROBAXIN) 500 MG tablet  2 times daily     01/14/19 1952    lidocaine (LIDODERM) 5 %  Every 24 hours     01/14/19 1952           Lashundra Shiveley A, PA-C 01/14/19 1953    Virgel Manifold, MD 01/17/19 [phone]

## 2019-01-16 ENCOUNTER — Telehealth: Payer: Self-pay | Admitting: *Deleted

## 2019-01-16 NOTE — Telephone Encounter (Signed)
Called the patient regarding 01/14/2019 visit to the [facility], due to a fall.  The ED doctor ordered an RX for Lidoderm patches, which are not covered by her insurance. Patient states she is using OTC pain patches, with results, and will start the Methocarbamol today.  It was on backorder at pharmacy.

## 2019-01-28 ENCOUNTER — Ambulatory Visit: Payer: Self-pay | Admitting: Adult Health

## 2019-01-31 NOTE — Progress Notes (Signed)
MEDICARE ANNUAL WELLNESS VISIT AND FOLLOW UP Assessment:    Encounter for Medicare annual wellness exam Patient agrees to schedule mammogram for fall, declines dexa  SVT (supraventricular tachycardia) ([place]) Not currently symptomatic; followed by cardiology  Hypertension Continue medication Monitor blood pressure at home; call if consistently over 130/80 Continue DASH diet.   Reminder to go to the ER if any CP, SOB, nausea, dizziness, severe HA, changes vision/speech, left arm numbness and tingling and jaw pain.  Gastroesophageal reflux disease, esophagitis presence not specified Well managed on current medications Discussed diet, avoiding triggers and other lifestyle changes  Pituitary tumor (excised 2013) S/p excision, doing well   Vitamin D deficiency Continue supplementation Defer vitamin D level to CPE  Prediabetes Discussed disease and risks Discussed diet/exercise, weight management  A1C annually; monitor serum glucose, weight   Overweight (BMI 25.0-29.9) Long discussion about weight loss, diet, and exercise Recommended diet heavy in fruits and veggies and low in animal meats, cheeses, and dairy products, appropriate calorie intake Discussed appropriate weight for height  Follow up at next visit  Medication management CBC, CMP/GFR, magnesium  Major depression in remission ([place]) Continue medications  Lifestyle discussed: diet/exerise, sleep hygiene, stress management, hydration  Hyperlipidemia Continue medications Continue low cholesterol diet and exercise.  Check lipid panel.   History of colon cancer, stage II Colonoscopy 06/2018; has been released   Over 30 minutes of exam, counseling, chart review, and critical decision making was performed  Future Appointments  Date Time Provider [place]  04/30/2019 11:00 AM Unk Pinto, MD [facility] None     Plan:   During the course of the visit the patient was educated and counseled about  appropriate screening and preventive services including:    Pneumococcal vaccine   Influenza vaccine  Prevnar 13  Td vaccine  Screening electrocardiogram  Colorectal cancer screening  Diabetes screening  Glaucoma screening  Nutrition counseling    Subjective:  Kristen [name] is a 78 y.o. female who presents for Medicare Annual Wellness Visit and 3 month follow up for HTN, hyperlipidemia, prediabetes, and vitamin D Def.   In 2013, patient underwent resection of a pituitary tumor and has done well since. She follows up with ? Neurology, reports last 07/2018, will request reports.   She has a history of colon cancer over 10 years ago, resolved with resection, had recent colonoscopy in 06/2018. Has been advised no further colonoscopies.   BMI is Body mass index is 27.09 kg/m., she has been working on diet, mainly practices moderation, eating out of her neighbor's vegetable garden during the summer.  Wt Readings from Last 3 Encounters:  02/04/19 162 lb 12.8 oz (73.8 kg)  01/14/19 165 lb (74.8 kg)  07/25/18 164 lb (74.4 kg)   Her blood pressure has been controlled at home, today their BP is BP: 134/80 She does not workout. She denies chest pain, shortness of breath, dizziness.    Atorvastatin 40 mg daily She is on cholesterol medication and denies myalgias. Her cholesterol is at goal. The cholesterol last visit was:   Lab Results  Component Value Date   CHOL 121 07/25/2018   HDL 49 (L) 07/25/2018   LDLCALC 51 07/25/2018   TRIG 133 07/25/2018   CHOLHDL 2.5 07/25/2018   Historically Mrs. Ternes has had well controlled prediabetes secondary to diet and exercise Lab Results  Component Value Date   HGBA1C 5.7 (H) 07/25/2018   Last GFR Lab Results  Component Value Date   [facility] 79 07/25/2018  Patient is on Vitamin D supplement.   Lab Results  Component Value Date   VD25OH 42 04/12/2018      Medication Review: Current Outpatient Medications on File Prior to Visit   Medication Sig Dispense Refill  . amLODipine (NORVASC) 2.5 MG tablet Take 1 tablet daily for  BP 90 tablet 0  . aspirin EC 81 MG tablet Take 81 mg by mouth daily.    Marland Kitchen atorvastatin (LIPITOR) 80 MG tablet TAKE 1 TABLET (80 MG TOTAL) BY MOUTH DAILY. FOR CHOLESTEROL 90 tablet 0  . CHOLECALCIFEROL PO Take 1,600 Units by mouth daily.     Marland Kitchen gabapentin (NEURONTIN) 100 MG capsule Take 1-3 caps up to three times a day as needed for burning nerve pain. May cause drowsiness. 270 capsule 1  . lidocaine (LIDODERM) 5 % Place 1 patch onto the skin daily. Remove & Discard patch within 12 hours or as directed by MD 30 patch 0  . losartan (COZAAR) 50 MG tablet Take 1/2-1 tab daily for blood pressure goal of <130/80. 90 tablet 1  . meloxicam (MOBIC) 15 MG tablet Take 1/2 to 1 tablet daily with  Food for pain  & Inflammation- try to limit to max 5 days   /week (Patient taking differently: as needed. Take 1/2 to 1 tablet daily with  Food for pain  & Inflammation- try to limit to max 5 days   /week) 90 tablet 0  . methocarbamol (ROBAXIN) 500 MG tablet Take 1 tablet (500 mg total) by mouth 2 (two) times daily. (Patient taking differently: Take 500 mg by mouth as needed. ) 12 tablet 0  . pantoprazole (PROTONIX) 40 MG tablet Take 1 tablet Daily for Acid Indigestion & Reflux 90 tablet 3  . PARoxetine (PAXIL) 30 MG tablet Take 1 tablet Daily for Mood 90 tablet 3   No current facility-administered medications on file prior to visit.     Allergies: Allergies  Allergen Reactions  . Aspirin Other (See Comments)    High Doses stomach trouble  . Codeine Nausea And Vomiting  . Fenofibrate Cough  . Hydrochlorothiazide Other (See Comments)    Fatigue  . Metoclopramide Other (See Comments)    Sedation    Current Problems (verified) has SVT (supraventricular tachycardia) ([place]); GERD (gastroesophageal reflux disease); Major depression in remission ([place]); Overweight (BMI 25.0-29.9); History of colon cancer, stage II;  Vitamin D deficiency; Prediabetes; Hyperlipidemia; Medication management; Pituitary tumor (excised 2013); and Hypertension on their problem list.  Screening Tests Immunization History  Administered Date(s) Administered  . Influenza Split 04/19/2012  . Influenza, High Dose Seasonal PF 04/17/2013, 03/12/2014, 03/03/2017, 04/12/2018  . Pneumococcal Conjugate-13 09/30/2014  . Pneumococcal Polysaccharide-23 04/19/2012  . Td 07/20/2005, 12/01/2015    Preventative care: DEXA: Declined Mammogram:  2015 -   Last colonoscopy: 2015 - 06/2018 - DONE  Prior vaccinations: TD or Tdap: 2017  Influenza: 2019  Pneumococcal: 2013 Prevnar13: 2016   Names of Other Physician/Practitioners you currently use: 1. [facility] here for primary care 2. Dr. Gershon Crane, eye doctor, last visit 2019, needs to schedule follow up  3. Does not see, dentist, last visit 2017, have partial   Patient Care Team: Unk Pinto, MD as PCP - General (Internal Medicine) Zadie Rhine Clent Demark, MD as Consulting Physician (Ophthalmology)  Surgical: She  has a past surgical history that includes Colon resection (1988); Colonoscopy (09/20/2011); Hot hemostasis (09/20/2011); Cataract extraction (June/july 2013); Colonoscopy (12/15/2011); Colonoscopy (01/26/2012); Eye surgery; Tumor removal; Craniotomy (04/18/2012); Transnasal approach (04/18/2012);  Laparoscopic right hemi colectomy (06/27/2012); Cystoscopy with ureteroscopy and stent placement (Bilateral, 10/01/2015); Back surgery; Colon surgery (1990's); Cystoscopy with ureteroscopy and stent placement (Right, 10/19/2015); and Holmium laser application (Right, [id number]). Family Her family history includes Brain cancer in her cousin; Breast cancer (age of onset: 60) in her sister; Cancer in her maternal aunt; Colon cancer in her cousin; Colon cancer (age of onset: 66) in an other family member; Colon cancer (age of onset: 38) in her sister; Colon polyps in  her son, son and another family member; Dementia in her sister; Heart disease in her father and mother; Liver cancer in her maternal uncle. Social history  She reports that she has never smoked. She has never used smokeless tobacco. She reports that she does not drink alcohol or use drugs.  MEDICARE WELLNESS OBJECTIVES: Physical activity: Current Exercise Habits: Home exercise routine, Type of exercise: walking, Time (Minutes): 45, Frequency (Times/Week): 7, Weekly Exercise (Minutes/Week): 315, Intensity: Mild, Exercise limited by: None identified Cardiac risk factors: Cardiac Risk Factors include: advanced age (>54men, >45 women);dyslipidemia;hypertension;family history of premature cardiovascular disease Depression/mood screen:   Depression screen [facility] 2/9 02/04/2019  Decreased Interest 0  Down, Depressed, Hopeless 0  PHQ - 2 Score 0    ADLs:  In your present state of health, do you have any difficulty performing the following activities: 02/04/2019 10/24/2018  Hearing? N -  Vision? N N  Difficulty concentrating or making decisions? N N  Walking or climbing stairs? N N  Dressing or bathing? N N  Doing errands, shopping? N N  Comment doesn't have a vehicle, friend/neighbor drives when needed -  Some recent data might be hidden     Cognitive Testing  Alert? Yes  Normal Appearance?Yes  Oriented to person? Yes  Place? Yes   Time? Yes  Recall of three objects?  Yes  Can perform simple calculations? Yes  Displays appropriate judgment?Yes  Can read the correct time from a watch face?Yes  EOL planning: Does Patient Have a Medical Advance Directive?: Yes Type of Advance Directive: Healthcare Power of Attorney, Living will Does patient want to make changes to medical advance directive?: No - Patient declined Copy of [place] in Chart?: No - copy requested Would patient like information on creating a medical advance directive?: No - Patient declined   Objective:    Today's Vitals   02/04/19 0845  BP: 134/80  Pulse: 62  Temp: (!) 97 F (36.1 C)  SpO2: 97%  Weight: 162 lb 12.8 oz (73.8 kg)  Height: 5\' 5"  (1.651 m)   Body mass index is 27.09 kg/m.  General appearance: alert, no distress, WD/WN, female HEENT: normocephalic, sclerae anicteric, TMs pearly, nares patent, no discharge or erythema, pharynx normal Oral cavity: MMM, no lesions Neck: supple, no lymphadenopathy, no thyromegaly, no masses Heart: RRR, normal S1, S2, no murmurs Lungs: CTA bilaterally, no wheezes, rhonchi, or rales Abdomen: +bs, soft, non tender, non distended, no masses, no hepatomegaly, no splenomegaly Musculoskeletal: Cast present on the right ankle, nontender, no swelling, no obvious deformity Extremities: no edema, no cyanosis, no clubbing Pulses: 2+ symmetric, upper and lower extremities, normal cap refill Neurological: alert, oriented x 3, CN2-12 intact, strength normal upper extremities and lower extremities, sensation normal throughout, DTRs 2+ throughout, no cerebellar signs, gait normal Psychiatric: normal affect, behavior normal, pleasant   Medicare Attestation I have personally reviewed: The patient's medical and social history Their use of alcohol, tobacco or illicit drugs Their current medications and supplements The patient's  functional ability including ADLs,fall risks, home safety risks, cognitive, and hearing and visual impairment Diet and physical activities Evidence for depression or mood disorders  The patient's weight, height, BMI, and visual acuity have been recorded in the chart.  I have made referrals, counseling, and provided education to the patient based on review of the above and I have provided the patient with a written personalized care plan for preventive services.     Izora Ribas, NP   02/04/2019

## 2019-02-04 ENCOUNTER — Encounter: Payer: Self-pay | Admitting: Adult Health

## 2019-02-04 ENCOUNTER — Ambulatory Visit (INDEPENDENT_AMBULATORY_CARE_PROVIDER_SITE_OTHER): Payer: PPO | Admitting: Adult Health

## 2019-02-04 ENCOUNTER — Other Ambulatory Visit: Payer: Self-pay

## 2019-02-04 VITALS — BP 134/80 | HR 62 | Temp 97.0°F | Ht 65.0 in | Wt 162.8 lb

## 2019-02-04 DIAGNOSIS — F325 Major depressive disorder, single episode, in full remission: Secondary | ICD-10-CM | POA: Diagnosis not present

## 2019-02-04 DIAGNOSIS — Z79899 Other long term (current) drug therapy: Secondary | ICD-10-CM

## 2019-02-04 DIAGNOSIS — Z0000 Encounter for general adult medical examination without abnormal findings: Secondary | ICD-10-CM

## 2019-02-04 DIAGNOSIS — I471 Supraventricular tachycardia, unspecified: Secondary | ICD-10-CM

## 2019-02-04 DIAGNOSIS — D497 Neoplasm of unspecified behavior of endocrine glands and other parts of nervous system: Secondary | ICD-10-CM | POA: Diagnosis not present

## 2019-02-04 DIAGNOSIS — R6889 Other general symptoms and signs: Secondary | ICD-10-CM | POA: Diagnosis not present

## 2019-02-04 DIAGNOSIS — E559 Vitamin D deficiency, unspecified: Secondary | ICD-10-CM | POA: Diagnosis not present

## 2019-02-04 DIAGNOSIS — Z85038 Personal history of other malignant neoplasm of large intestine: Secondary | ICD-10-CM | POA: Diagnosis not present

## 2019-02-04 DIAGNOSIS — Z0001 Encounter for general adult medical examination with abnormal findings: Secondary | ICD-10-CM | POA: Diagnosis not present

## 2019-02-04 DIAGNOSIS — I10 Essential (primary) hypertension: Secondary | ICD-10-CM

## 2019-02-04 DIAGNOSIS — R7303 Prediabetes: Secondary | ICD-10-CM

## 2019-02-04 DIAGNOSIS — E782 Mixed hyperlipidemia: Secondary | ICD-10-CM

## 2019-02-04 DIAGNOSIS — E663 Overweight: Secondary | ICD-10-CM | POA: Diagnosis not present

## 2019-02-04 DIAGNOSIS — K219 Gastro-esophageal reflux disease without esophagitis: Secondary | ICD-10-CM

## 2019-02-04 NOTE — Patient Instructions (Addendum)
  Kristen [name] , Thank you for taking time to come for your Medicare Wellness Visit. I appreciate your ongoing commitment to your health goals. Please review the following plan we discussed and let me know if I can assist you in the future.   These are the goals we discussed: Goals    . Blood Pressure < 130/80    . DIET - INCREASE WATER INTAKE       This is a list of the screening recommended for you and due dates:  Health Maintenance  Topic Date Due  . Flu Shot  01/05/2019  . DEXA scan (bone density measurement)  02/04/2020*  . Tetanus Vaccine  11/30/2025  . Pneumonia vaccines  Completed  *Topic was postponed. The date shown is not the original due date.      Please schedule an appointment for Dr. Zadie Rhine - [phone]  Please call your ? Pituitary doctor and have them forward [place] their recent notes.   HOW TO SCHEDULE A MAMMOGRAM  [facility]  7 a.m.-6:30 p.m., Monday 7 a.m.-5 p.m., Tuesday-Friday Schedule an appointment by calling [phone].   [facility] Schedule an appointment by calling [phone].   Call end of September - nurse visit for flu vaccine

## 2019-02-05 LAB — CBC WITH DIFFERENTIAL/PLATELET
Absolute Monocytes: 410 cells/uL (ref 200–950)
Basophils Absolute: 43 cells/uL (ref 0–200)
Basophils Relative: 0.6 %
Eosinophils Absolute: 151 cells/uL (ref 15–500)
Eosinophils Relative: 2.1 %
HCT: 36.6 % (ref 35.0–45.0)
Hemoglobin: 12 g/dL (ref 11.7–15.5)
Lymphs Abs: 1181 cells/uL (ref 850–3900)
MCH: 28.6 pg (ref 27.0–33.0)
MCHC: 32.8 g/dL (ref 32.0–36.0)
MCV: 87.1 fL (ref 80.0–100.0)
MPV: 10.9 fL (ref 7.5–12.5)
Monocytes Relative: 5.7 %
Neutro Abs: 5414 cells/uL (ref 1500–7800)
Neutrophils Relative %: 75.2 %
Platelets: 265 10*3/uL (ref 140–400)
RBC: 4.2 10*6/uL (ref 3.80–5.10)
RDW: 12.7 % (ref 11.0–15.0)
Total Lymphocyte: 16.4 %
WBC: 7.2 10*3/uL (ref 3.8–10.8)

## 2019-02-05 LAB — COMPLETE METABOLIC PANEL WITH GFR
AG Ratio: 1.8 (calc) (ref 1.0–2.5)
ALT: 16 U/L (ref 6–29)
AST: 15 U/L (ref 10–35)
Albumin: 4.2 g/dL (ref 3.6–5.1)
Alkaline phosphatase (APISO): 76 U/L (ref 37–153)
BUN: 20 mg/dL (ref 7–25)
CO2: 30 mmol/L (ref 20–32)
Calcium: 10.3 mg/dL (ref 8.6–10.4)
Chloride: 102 mmol/L (ref 98–110)
Creat: 0.78 mg/dL (ref 0.60–0.93)
GFR, Est African American: 84 mL/min/{1.73_m2} (ref >=60)
GFR, Est Non African American: 73 mL/min/{1.73_m2} (ref >=60)
Globulin: 2.3 g/dL (calc) (ref 1.9–3.7)
Glucose, Bld: 116 mg/dL — ABNORMAL HIGH (ref 65–99)
Potassium: 4.3 mmol/L (ref 3.5–5.3)
Sodium: 141 mmol/L (ref 135–146)
Total Bilirubin: 0.5 mg/dL (ref 0.2–1.2)
Total Protein: 6.5 g/dL (ref 6.1–8.1)

## 2019-02-05 LAB — LIPID PANEL
Cholesterol: 124 mg/dL (ref <200)
HDL: 47 mg/dL — ABNORMAL LOW (ref >=50)
LDL Cholesterol (Calc): 56 mg/dL (calc)
Non-HDL Cholesterol (Calc): 77 mg/dL (calc) (ref <130)
Total CHOL/HDL Ratio: 2.6 (calc) (ref <5.0)
Triglycerides: 128 mg/dL (ref <150)

## 2019-02-05 LAB — TSH: TSH: 1.87 mIU/L (ref 0.40–4.50)

## 2019-02-05 LAB — MAGNESIUM: Magnesium: 1.8 mg/dL (ref 1.5–2.5)

## 2019-03-09 ENCOUNTER — Other Ambulatory Visit: Payer: Self-pay | Admitting: Internal Medicine

## 2019-03-09 ENCOUNTER — Other Ambulatory Visit: Payer: Self-pay | Admitting: Adult Health

## 2019-03-09 DIAGNOSIS — E782 Mixed hyperlipidemia: Secondary | ICD-10-CM

## 2019-03-09 MED ORDER — ATORVASTATIN CALCIUM 80 MG PO TABS: ORAL_TABLET | ORAL | 1 refills | Status: DC

## 2019-03-20 ENCOUNTER — Ambulatory Visit: Payer: PPO

## 2019-04-09 NOTE — Progress Notes (Signed)
Assessment and Plan:  Kristen [name] was seen today for shoulder pain.  Diagnoses and all orders for this visit:  Right shoulder pain, anterolateral, subacute Following fall; persistent; significantly limited ROM due to pain with active and passive ROM  Concerning for possible AC joint abnormality and/or rotator cuff tendinopathy She is managing pain fairly with ibuprofen and other OTC agents, declines prescription medication Recommended xray due to [facility] joint tenderness; she requests defer to ortho Requests referral which will be provided today  -     Ambulatory referral to Orthopedics   Further disposition pending results of labs. Discussed med's effects and SE's.   Over 15 minutes of exam, counseling, chart review, and critical decision making was performed.   Future Appointments  Date Time Provider [place]  04/30/2019 11:00 AM Unk Pinto, MD [facility] None    ------------------------------------------------------------------------------------------------------------------   HPI BP 112/68   Pulse 65   Temp (!) 96.1 F (35.6 C)   Ht 5\' 5"  (1.651 m)   Wt 159 lb (72.1 kg)   SpO2 98%   BMI 26.46 kg/m   78 y.o.female R handed female, former restaurant owner/cook presents for evaluation of R shoulder pain x 3 months. She is accompanied by her friend.   3 months ago she reports fell while picking up limbs in yard fell backwards, not sure if she caught herself with arm or fell on her shoulder, but immediately had R shoulder pain. She reports she got up right way and continued to pick up limbs and was able to move extremity but was uncomfortable. She reports since that time she has constant discomfort that flares if overuses extremity, cannot lift arm above 90 degrees, has been propping up with a crutch to curl her hair.   Reports at baseline has discomfort 7/10, with any use shoots up to 9/10, shoots down arm and to back of neck. Denies neck pain, numbness, tingling,  sensation of weakness. She has tried ibuprofen, has taken 400 mg daily at night which allows her to sleep, but does wake up with pain if she rolls over on extremity. She has also tried lidocaine patches, heat application does help, has tried CBD cream which all do help but overall not resolving and seems to be getting worse.   Not currently established with ortho and requesting a referral.     Lab Results  Component Value Date   GFRNONAA 73 02/04/2019    Past Medical History:  Diagnosis Date  . Arthritis   . Colon cancer ([place]) 1990s, 2014  . Colon polyps   . Depression   . Dysrhythmia 04/2012   SVT - RATE 145 POST OP CRANIOTOMY SURGERY --TX'D AND RESOLVED--PT STATES NO FURTHER PROBLEM THAT SHE IS AWARE OF -SHE HAS NOT HAD TO SEE CARDIOLOGIST  . GERD (gastroesophageal reflux disease)   . Headache(784.0)    hx migraines  . Hypertension    pt states she has not taken any b/p medication since her brain surgery in oct 3013--took herself off the medication  . Pituitary mass ([place]) NOV 2013   MASS FOUND AFTER PT EXPERIENCED BILATERAL BLURRED / HAZY VISION -ESP LEFT EYE.  S/P CRANIOTOMY AND EYESIGHT RETURNED TO NORMAL--PT STILL HAS A LOT OF TENDERNESS RIGHT SIDE OF NOSE --THE CRANIOTOMY WAS DONE BY TRANSNASAL APPROACH  . Pneumonia yrs ago   hx of pneumonia  . PONV (postoperative nausea and vomiting)    PT STATES PLEASE NOTE SHE HAS TENDERNESS RIGHT SIDE OF NOSE - SINCE TRANSNASAL CRANIOTOMY 04/2012  .  Prediabetes   . Vitamin D deficiency      Allergies  Allergen Reactions  . Aspirin Other (See Comments)    High Doses stomach trouble  . Codeine Nausea And Vomiting  . Fenofibrate Cough  . Hydrochlorothiazide Other (See Comments)    Fatigue  . Metoclopramide Other (See Comments)    Sedation    Current Outpatient Medications on File Prior to Visit  Medication Sig  . amLODipine (NORVASC) 2.5 MG tablet Take 1 tablet daily for  BP  . aspirin EC 81 MG tablet Take 81 mg by mouth daily.   Marland Kitchen atorvastatin (LIPITOR) 80 MG tablet Take 1 tablet Daily for Cholesterol  . CHOLECALCIFEROL PO Take 1,600 Units by mouth daily.   Marland Kitchen gabapentin (NEURONTIN) 100 MG capsule Take 1-3 caps up to three times a day as needed for burning nerve pain. May cause drowsiness.  . lidocaine (LIDODERM) 5 % Place 1 patch onto the skin daily. Remove & Discard patch within 12 hours or as directed by MD  . losartan (COZAAR) 50 MG tablet Take 1 tablet Daily for BP  . meloxicam (MOBIC) 15 MG tablet Take 1/2 to 1 tablet daily with  Food for pain  & Inflammation- try to limit to max 5 days   /week (Patient taking differently: as needed. Take 1/2 to 1 tablet daily with  Food for pain  & Inflammation- try to limit to max 5 days   /week)  . methocarbamol (ROBAXIN) 500 MG tablet Take 1 tablet (500 mg total) by mouth 2 (two) times daily. (Patient taking differently: Take 500 mg by mouth as needed. )  . pantoprazole (PROTONIX) 40 MG tablet Take 1 tablet Daily for Acid Indigestion & Reflux  . PARoxetine (PAXIL) 30 MG tablet Take 1 tablet Daily for Mood   No current facility-administered medications on file prior to visit.     ROS: all negative except above.   Physical Exam:  BP 112/68   Pulse 65   Temp (!) 96.1 F (35.6 C)   Ht 5\' 5"  (1.651 m)   Wt 159 lb (72.1 kg)   SpO2 98%   BMI 26.46 kg/m   General Appearance: Well nourished, in no apparent distress. Eyes: conjunctiva no swelling or erythema ENT/Mouth: Hearing normal.  Neck: Supple Respiratory: Respiratory effort normal, BS equal bilaterally without rales, rhonchi, wheezing or stridor.  Cardio: RRR with no MRGs. Brisk peripheral pulses without edema.  Lymphatics: Non tender without lymphadenopathy.  Musculoskeletal: no obvious deformity Right positive for tenderness over the acromioclavicular joint. No palpable displacement/bony abnormality or crepitus Strength is limited with flexion, abduction, internal rotation due to pain  Pain with minimal flexion,  abduction, internal rotation; pain with active flexion past 60 degrees, abduction past 90 degrees and internal rotation.   No edema, erythema.  Full ROM left shoulder Grip strength symmetrical Normal gait Skin: Warm, dry without rashes, lesions, ecchymosis.  Neuro: Sensation intact.  Psych: Awake and oriented X 3, normal affect, Insight and Judgment appropriate.     Izora Ribas, NP 4:17 PM [facility]

## 2019-04-10 ENCOUNTER — Ambulatory Visit (INDEPENDENT_AMBULATORY_CARE_PROVIDER_SITE_OTHER): Payer: PPO | Admitting: Adult Health

## 2019-04-10 ENCOUNTER — Encounter: Payer: Self-pay | Admitting: Adult Health

## 2019-04-10 ENCOUNTER — Other Ambulatory Visit: Payer: Self-pay

## 2019-04-10 VITALS — BP 112/68 | HR 65 | Temp 96.1°F | Ht 65.0 in | Wt 159.0 lb

## 2019-04-10 DIAGNOSIS — M25511 Pain in right shoulder: Secondary | ICD-10-CM | POA: Diagnosis not present

## 2019-04-10 NOTE — Patient Instructions (Signed)
Recommend ibuprofen 400 mg up to 4 times a day as needed for pain - remember to take with food or milk, not on an empty stomach     Shoulder Pain Many things can cause shoulder pain, including:  An injury to the shoulder.  Overuse of the shoulder.  Arthritis. The source of the pain can be:  Inflammation.  An injury to the shoulder joint.  An injury to a tendon, ligament, or bone. Follow these instructions at home: Pay attention to changes in your symptoms. Let your health care provider know about them. Follow these instructions to relieve your pain. If you have a sling:  Wear the sling as told by your health care provider. Remove it only as told by your health care provider.  Loosen the sling if your fingers tingle, become numb, or turn cold and blue.  Keep the sling clean.  If the sling is not waterproof: ? Do not let it get wet. Remove it to shower or bathe.  Move your arm as little as possible, but keep your hand moving to prevent swelling. Managing pain, stiffness, and swelling   If directed, put ice on the painful area: ? Put ice in a plastic bag. ? Place a towel between your skin and the bag. ? Leave the ice on for 20 minutes, 2-3 times per day. Stop applying ice if it does not help with the pain.  Squeeze a soft ball or a foam pad as much as possible. This helps to keep the shoulder from swelling. It also helps to strengthen the arm. General instructions  Take over-the-counter and prescription medicines only as told by your health care provider.  Keep all follow-up visits as told by your health care provider. This is important. Contact a health care provider if:  Your pain gets worse.  Your pain is not relieved with medicines.  New pain develops in your arm, hand, or fingers. Get help right away if:  Your arm, hand, or fingers: ? Tingle. ? Become numb. ? Become swollen. ? Become painful. ? Turn white or blue. Summary  Shoulder pain can be caused  by an injury, overuse, or arthritis.  Pay attention to changes in your symptoms. Let your health care provider know about them.  This condition may be treated with a sling, ice, and pain medicines.  Contact your health care provider if the pain gets worse or new pain develops. Get help right away if your arm, hand, or fingers tingle or become numb, swollen, or painful.  Keep all follow-up visits as told by your health care provider. This is important. This information is not intended to replace advice given to you by your health care provider. Make sure you discuss any questions you have with your health care provider. Document Released: 03/02/2005 Document Revised: 12/05/2017 Document Reviewed: 12/05/2017 Elsevier Patient Education  [place].    Ibuprofen tablets and capsules What is this medicine? IBUPROFEN (eye BYOO proe fen) is a non-steroidal anti-inflammatory drug (NSAID). It is used for dental pain, fever, headaches or migraines, osteoarthritis, rheumatoid arthritis, or painful monthly periods. It can also relieve minor aches and pains caused by a cold, flu, or sore throat. This medicine may be used for other purposes; ask your health care provider or pharmacist if you have questions. COMMON BRAND NAME(S): Advil, Advil Junior Strength, Advil Migraine, Genpril, Ibren, IBU, Midol, Midol Cramps and Body Aches, Motrin, Motrin IB, Motrin Junior Strength, Motrin Migraine Pain, Samson-8, Toxicology Saliva Collection What should I  tell my health care provider before I take this medicine? They need to know if you have any of these conditions:  cigarette smoker  coronary artery bypass graft (CABG) surgery within the past 2 weeks  drink more than 3 alcohol-containing drinks a day  heart disease  high blood pressure  history of stomach bleeding  kidney disease  liver disease  lung or breathing disease, like asthma  an unusual or allergic reaction to ibuprofen, aspirin,  other NSAIDs, other medicines, foods, dyes, or preservatives  pregnant or trying to get pregnant  breast-feeding How should I use this medicine? Take this medicine by mouth with a glass of water. Follow the directions on the prescription label. Take this medicine with food if your stomach gets upset. Try to not lie down for at least 10 minutes after you take the medicine. Take your medicine at regular intervals. Do not take your medicine more often than directed. A special MedGuide will be given to you by the pharmacist with each prescription and refill. Be sure to read this information carefully each time. Talk to your pediatrician regarding the use of this medicine in children. Special care may be needed. Overdosage: If you think you have taken too much of this medicine contact a poison control center or emergency room at once. NOTE: This medicine is only for you. Do not share this medicine with others. What if I miss a dose? If you miss a dose, take it as soon as you can. If it is almost time for your next dose, take only that dose. Do not take double or extra doses. What may interact with this medicine? Do not take this medicine with any of the following medications:  cidofovir  ketorolac  methotrexate  pemetrexed This medicine may also interact with the following medications:  alcohol  aspirin  diuretics  lithium  other drugs for inflammation like prednisone  warfarin This list may not describe all possible interactions. Give your health care provider a list of all the medicines, herbs, non-prescription drugs, or dietary supplements you use. Also tell them if you smoke, drink alcohol, or use illegal drugs. Some items may interact with your medicine. What should I watch for while using this medicine? Tell your doctor or healthcare provider if your symptoms do not start to get better or if they get worse. This medicine may cause serious skin reactions. They can happen weeks  to months after starting the medicine. Contact your healthcare provider right away if you notice fevers or flu-like symptoms with a rash. The rash may be red or purple and then turn into blisters or peeling of the skin. Or, you might notice a red rash with swelling of the face, lips or lymph nodes in your neck or under your arms. This medicine does not prevent heart attack or stroke. In fact, this medicine may increase the chance of a heart attack or stroke. The chance may increase with longer use of this medicine and in people who have heart disease. If you take aspirin to prevent heart attack or stroke, talk with your doctor or healthcare provider. Do not take other medicines that contain aspirin, ibuprofen, or naproxen with this medicine. Side effects such as stomach upset, nausea, or ulcers may be more likely to occur. Many medicines available without a prescription should not be taken with this medicine. This medicine can cause ulcers and bleeding in the stomach and intestines at any time during treatment. Ulcers and bleeding can happen without warning symptoms  and can cause death. To reduce your risk, do not smoke cigarettes or drink alcohol while you are taking this medicine. You may get drowsy or dizzy. Do not drive, use machinery, or do anything that needs mental alertness until you know how this medicine affects you. Do not stand or sit up quickly, especially if you are an older patient. This reduces the risk of dizzy or fainting spells. This medicine can cause you to bleed more easily. Try to avoid damage to your teeth and gums when you brush or floss your teeth. This medicine may be used to treat migraines. If you take migraine medicines for 10 or more days a month, your migraines may get worse. Keep a diary of headache days and medicine use. Contact your healthcare provider if your migraine attacks occur more frequently. What side effects may I notice from receiving this medicine? Side effects  that you should report to your doctor or health care professional as soon as possible:  allergic reactions like skin rash, itching or hives, swelling of the face, lips, or tongue  redness, blistering, peeling or loosening of the skin, including inside the mouth  severe stomach pain  signs and symptoms of bleeding such as bloody or black, tarry stools; red or dark-[place] urine; spitting up blood or [place] material that looks like coffee grounds; red spots on the skin; unusual bruising or bleeding from the eye, gums, or nose  signs and symptoms of a blood clot such as changes in vision; chest pain; severe, sudden headache; trouble speaking; sudden numbness or weakness of the face, arm, or leg  unexplained weight gain or swelling  unusually weak or tired  yellowing of eyes or skin Side effects that usually do not require medical attention (report to your doctor or health care professional if they continue or are bothersome):  bruising  diarrhea  dizziness, drowsiness  headache  nausea, vomiting This list may not describe all possible side effects. Call your doctor for medical advice about side effects. You may report side effects to FDA at 1-800-FDA-1088. Where should I keep my medicine? Keep out of the reach of children. Store at room temperature between 15 and 30 degrees C (59 and 86 degrees F). Keep container tightly closed. Throw away any unused medicine after the expiration date. NOTE: This sheet is a summary. It may not cover all possible information. If you have questions about this medicine, talk to your doctor, pharmacist, or health care provider.  2020 Elsevier/Gold Standard (2018-08-08 14:11:00)

## 2019-04-17 ENCOUNTER — Ambulatory Visit (INDEPENDENT_AMBULATORY_CARE_PROVIDER_SITE_OTHER): Payer: PPO

## 2019-04-17 ENCOUNTER — Other Ambulatory Visit: Payer: Self-pay

## 2019-04-17 VITALS — Temp 97.3°F

## 2019-04-17 DIAGNOSIS — Z23 Encounter for immunization: Secondary | ICD-10-CM

## 2019-04-17 DIAGNOSIS — M25511 Pain in right shoulder: Secondary | ICD-10-CM | POA: Diagnosis not present

## 2019-04-29 ENCOUNTER — Encounter: Payer: Self-pay | Admitting: Internal Medicine

## 2019-04-30 ENCOUNTER — Encounter: Payer: Self-pay | Admitting: Internal Medicine

## 2019-05-15 DIAGNOSIS — M25511 Pain in right shoulder: Secondary | ICD-10-CM | POA: Diagnosis not present

## 2019-06-19 DIAGNOSIS — M25511 Pain in right shoulder: Secondary | ICD-10-CM | POA: Diagnosis not present

## 2019-06-24 ENCOUNTER — Encounter: Payer: Self-pay | Admitting: Internal Medicine

## 2019-06-24 NOTE — Progress Notes (Signed)
       R E  S  C  H  E  D  U  L  E  D                                                                                                                                                                                    This very nice 79 y.o. WWF presents belated overdue follow up with HTN, HLD, Pre-Diabetes and Vitamin D Deficiency. In 2013, she underwent transnasal resection of a pituitary tumor.      Patient is treated for HTN (1999) & BP has been controlled at home. Today's  .   In 2011, she had a negative Cardiolite. Patient has had no complaints of any cardiac type chest pain, palpitations, dyspnea / orthopnea / PND, claudication, or dependent edema.      Hyperlipidemia is controlled with diet & meds. Patient denies myalgias or other med SE's. Last Lipids were at goal:  Lab Results  Component Value Date   CHOL 124 02/04/2019   HDL 47 (L) 02/04/2019   LDLCALC 56 02/04/2019   TRIG 128 02/04/2019   CHOLHDL 2.6 02/04/2019        Also, the patient has history of  PreDiabetes and has had no symptoms of reactive hypoglycemia, diabetic polys, paresthesias or visual blurring.  Last A1c was not at goal:  Lab Results  Component Value Date   HGBA1C 5.7 (H) 07/25/2018        Further, the patient also has history of Vitamin D Deficiency and supplements vitamin D without any suspected side-effects. Last vitamin D was still low (goal 70-100):  Lab Results  Component Value Date   VD25OH 42 04/12/2018

## 2019-06-25 ENCOUNTER — Ambulatory Visit: Payer: PPO | Admitting: Internal Medicine

## 2019-06-25 DIAGNOSIS — R7309 Other abnormal glucose: Secondary | ICD-10-CM | POA: Insufficient documentation

## 2019-06-26 ENCOUNTER — Other Ambulatory Visit: Payer: Self-pay | Admitting: Internal Medicine

## 2019-06-26 DIAGNOSIS — I10 Essential (primary) hypertension: Secondary | ICD-10-CM

## 2019-06-27 ENCOUNTER — Ambulatory Visit (INDEPENDENT_AMBULATORY_CARE_PROVIDER_SITE_OTHER): Payer: PPO | Admitting: Internal Medicine

## 2019-06-27 ENCOUNTER — Other Ambulatory Visit: Payer: Self-pay

## 2019-06-27 VITALS — BP 152/80 | HR 76 | Temp 97.2°F | Resp 16 | Ht 65.0 in | Wt 163.4 lb

## 2019-06-27 DIAGNOSIS — E559 Vitamin D deficiency, unspecified: Secondary | ICD-10-CM

## 2019-06-27 DIAGNOSIS — I10 Essential (primary) hypertension: Secondary | ICD-10-CM | POA: Diagnosis not present

## 2019-06-27 DIAGNOSIS — E782 Mixed hyperlipidemia: Secondary | ICD-10-CM

## 2019-06-27 DIAGNOSIS — Z79899 Other long term (current) drug therapy: Secondary | ICD-10-CM

## 2019-06-27 DIAGNOSIS — R7309 Other abnormal glucose: Secondary | ICD-10-CM | POA: Diagnosis not present

## 2019-06-27 DIAGNOSIS — R42 Dizziness and giddiness: Secondary | ICD-10-CM

## 2019-06-27 MED ORDER — BISOPROLOL FUMARATE 10 MG PO TABS: ORAL_TABLET | ORAL | 1 refills | Status: DC

## 2019-06-27 MED ORDER — MECLIZINE HCL 25 MG PO TABS: ORAL_TABLET | ORAL | 0 refills | Status: DC

## 2019-06-27 NOTE — Progress Notes (Signed)
History of Present Illness:      This very nice 79 y.o. WWF presents belated overdue  for follow up with HTN, HLD, Pre-Diabetes and Vitamin D Deficiency. She underwent transnasal resection of a benign pituitary tumor in 2013.      Patient reports over the last month of intermittent dizziness or positional  vertigo with occasional nausea.             Patient is treated for HTN (1999) & BP has been controlled at home.Today's BP was elevated at 152/80 & rechecked x 2 at 172/99.  In 2011, she had a negative Cardiolite.  Patient has had no complaints of any cardiac type chest pain, palpitations, dyspnea / orthopnea / PND, dizziness, claudication, or dependent edema.       Hyperlipidemia is controlled with diet & meds. Patient denies myalgias or other med SE's. Last Lipids were at goal:  Lab Results  Component Value Date   CHOL 124 02/04/2019   HDL 47 (L) 02/04/2019   LDLCALC 56 02/04/2019   TRIG 128 02/04/2019   CHOLHDL 2.6 02/04/2019        Also, the patient has history of PreDiabetes (A1c 5.9% / I 90 / 2011 & A1c 6.1% / 2012 &2014) and has had no symptoms of reactive hypoglycemia, diabetic polys, paresthesias or visual blurring.  Last A1c was near goal:  Lab Results  Component Value Date   HGBA1C 5.7 (H) 07/25/2018        Further, the patient also has history of Vitamin D Deficiency ("31" / 2008)   and supplements vitamin D without any suspected side-effects. Last vitamin D was still low (goal 70-100):  Lab Results  Component Value Date   VD25OH 42 04/12/2018    Current Outpatient Medications on File Prior to Visit  Medication Sig  . amLODipine (NORVASC) 2.5 MG tablet Take 1 tablet once daily for blood pressure.  Marland Kitchen aspirin EC 81 MG tablet Take 81 mg by mouth daily.  Marland Kitchen atorvastatin (LIPITOR) 80 MG tablet Take 1 tablet Daily for Cholesterol  . CHOLECALCIFEROL PO Take 1,600 Units by mouth daily.   Marland Kitchen gabapentin (NEURONTIN) 100 MG capsule Take 1-3 caps up to three times a  day as needed for burning nerve pain. May cause drowsiness.  . lidocaine (LIDODERM) 5 % Place 1 patch onto the skin daily. Remove & Discard patch within 12 hours or as directed by MD  . losartan (COZAAR) 50 MG tablet Take 1 tablet Daily for BP  . meloxicam (MOBIC) 15 MG tablet Take 1/2 to 1 tablet daily with  Food for pain  & Inflammation- try to limit to max 5 days   /week (Patient taking differently: as needed. Take 1/2 to 1 tablet daily with  Food for pain  & Inflammation- try to limit to max 5 days   /week)  . pantoprazole (PROTONIX) 40 MG tablet Take 1 tablet Daily for Acid Indigestion & Reflux  . PARoxetine (PAXIL) 30 MG tablet Take 1 tablet Daily for Mood   No current facility-administered medications on file prior to visit.    Allergies  Allergen Reactions  . Aspirin Other (See Comments)    High Doses stomach trouble  . Codeine Nausea And Vomiting  . Fenofibrate Cough  . Hydrochlorothiazide Other (See Comments)    Fatigue  . Metoclopramide Other (See Comments)    Sedation    PMHx:   Past Medical History:  Diagnosis Date  . Arthritis   .  Colon cancer ([place]) 1990s, 2014  . Colon polyps   . Depression   . Dysrhythmia 04/2012   SVT - RATE 145 POST OP CRANIOTOMY SURGERY --TX'D AND RESOLVED--PT STATES NO FURTHER PROBLEM THAT SHE IS AWARE OF -SHE HAS NOT HAD TO SEE CARDIOLOGIST  . GERD (gastroesophageal reflux disease)   . Headache(784.0)    hx migraines  . Hypertension    pt states she has not taken any b/p medication since her brain surgery in oct 3013--took herself off the medication  . Pituitary mass ([place]) NOV 2013   MASS FOUND AFTER PT EXPERIENCED BILATERAL BLURRED / HAZY VISION -ESP LEFT EYE.  S/P CRANIOTOMY AND EYESIGHT RETURNED TO NORMAL--PT STILL HAS A LOT OF TENDERNESS RIGHT SIDE OF NOSE --THE CRANIOTOMY WAS DONE BY TRANSNASAL APPROACH  . Pneumonia yrs ago   hx of pneumonia  . PONV (postoperative nausea and vomiting)    PT STATES PLEASE NOTE SHE HAS TENDERNESS RIGHT  SIDE OF NOSE - SINCE TRANSNASAL CRANIOTOMY 04/2012  . Prediabetes   . Vitamin D deficiency    Immunization History  Administered Date(s) Administered  . Influenza Split 04/19/2012  . Influenza, High Dose Seasonal PF 04/17/2013, 03/12/2014, 03/03/2017, 04/12/2018, 04/17/2019  . Pneumococcal Conjugate-13 09/30/2014  . Pneumococcal Polysaccharide-23 04/19/2012  . Td 07/20/2005, 12/01/2015   Past Surgical History:  Procedure Laterality Date  . BACK SURGERY     lower  . CATARACT EXTRACTION  June/july 2013   bilateral eyes  . COLON RESECTION  1988  . COLON SURGERY  1990's  . COLONOSCOPY  09/20/2011   Procedure: COLONOSCOPY;  Surgeon: Missy Sabins, MD;  Location: [facility];  Service: Endoscopy;  Laterality: N/A;  . COLONOSCOPY  12/15/2011   Procedure: COLONOSCOPY;  Surgeon: Missy Sabins, MD;  Location: [facility];  Service: Endoscopy;  Laterality: N/A;  . COLONOSCOPY  01/26/2012   Procedure: COLONOSCOPY;  Surgeon: Cleotis Nipper, MD;  Location: [facility];  Service: Endoscopy;  Laterality: N/A;  . CRANIOTOMY  04/18/2012   Procedure: CRANIOTOMY HYPOPHYSECTOMY TRANSNASAL APPROACH;  Surgeon: Ophelia Charter, MD;  Location: [facility];  Service: Neurosurgery;  Laterality: Bilateral;  Transphenoidal resection of Pituitary tumor.   . CYSTOSCOPY WITH URETEROSCOPY AND STENT PLACEMENT Bilateral 10/01/2015   Procedure: CYSTOSCOPY, retrograde WITH URETEROSCOPY AND STENT PLACEMENT, ;  Surgeon: Raynelle Bring, MD;  Location: [facility];  Service: Urology;  Laterality: Bilateral;  . CYSTOSCOPY WITH URETEROSCOPY AND STENT PLACEMENT Right 10/19/2015   Procedure: CYSTOSCOPY,  RIGHT URETEROSCOPY, HOLMIUM LASER RIGHT URETERAL STONE, RIGHT URETERAL STENT PLACEMENT, LEFT URETEROSCOPY, INSERTION LEFT URETERAL STENT;  Surgeon: Raynelle Bring, MD;  Location: [facility];  Service: Urology;  Laterality: Right;  . EYE SURGERY     cataract bil  . HOLMIUM LASER APPLICATION Right [id number]   Procedure: HOLMIUM LASER  ;   Surgeon: Raynelle Bring, MD;  Location: [facility];  Service: Urology;  Laterality: Right;  . HOT HEMOSTASIS  09/20/2011   Procedure: HOT HEMOSTASIS (ARGON PLASMA COAGULATION/BICAP);  Surgeon: Missy Sabins, MD;  Location: [facility];  Service: Endoscopy;  Laterality: N/A;  . LAPAROSCOPIC RIGHT HEMI COLECTOMY  06/27/2012   Procedure: LAPAROSCOPIC RIGHT HEMI COLECTOMY;  Surgeon: Pedro Earls, MD;  Location: [facility];  Service: General;  Laterality: N/A;  Laparoscopic Assited Right Hemicolectomy  . TRANSNASAL APPROACH  04/18/2012   Procedure: TRANSNASAL APPROACH;  Surgeon: Rozetta Nunnery, MD;  Location: [facility];  Service: ENT;  Laterality: Bilateral;  Transnasal approach  . TUMOR REMOVAL  Left leg    FHx:    Reviewed / unchanged  SHx:    Reviewed / unchanged   Systems Review:  Constitutional: Denies fever, chills, wt changes, headaches, insomnia, fatigue, night sweats, change in appetite. Eyes: Denies redness, blurred vision, diplopia, discharge, itchy, watery eyes.  ENT: Denies discharge, congestion, post nasal drip, epistaxis, sore throat, earache, hearing loss, dental pain, tinnitus, vertigo, sinus pain, snoring.  CV: Denies chest pain, palpitations, irregular heartbeat, syncope, dyspnea, diaphoresis, orthopnea, PND, claudication or edema. Respiratory: denies cough, dyspnea, DOE, pleurisy, hoarseness, laryngitis, wheezing.  Gastrointestinal: Denies dysphagia, odynophagia, heartburn, reflux, water brash, abdominal pain or cramps, nausea, vomiting, bloating, diarrhea, constipation, hematemesis, melena, hematochezia  or hemorrhoids. Genitourinary: Denies dysuria, frequency, urgency, nocturia, hesitancy, discharge, hematuria or flank pain. Musculoskeletal: Denies arthralgias, myalgias, stiffness, jt. swelling, pain, limping or strain/sprain.  Skin: Denies pruritus, rash, hives, warts, acne, eczema or change in skin lesion(s). Neuro: No weakness, tremor, incoordination, spasms,  paresthesia or pain. Psychiatric: Denies confusion, memory loss or sensory loss. Endo: Denies change in weight, skin or hair change.  Heme/Lymph: No excessive bleeding, bruising or enlarged lymph nodes.  Physical Exam  BP (!) 152/80   Pulse 76   Temp (!) 97.2 F (36.2 C)   Resp 16   Ht 5\' 5"  (1.651 m)   Wt 163 lb 6.4 oz (74.1 kg)   BMI 27.19 kg/m   Appears  well nourished, well groomed  and in no distress.  Eyes: PERRLA, EOMs, conjunctiva no swelling or erythema. Sinuses: No frontal/maxillary tenderness ENT/Mouth: EAC's clear, TM's nl w/o erythema, bulging. Nares clear w/o erythema, swelling, exudates. Oropharynx clear without erythema or exudates. Oral hygiene is good. Tongue normal, non obstructing. Hearing intact.  Neck: Supple. Thyroid not palpable. Car 2+/2+ without bruits, nodes or JVD. Chest: Respirations nl with BS clear & equal w/o rales, rhonchi, wheezing or stridor.  Cor: Heart sounds normal w/ regular rate and rhythm without sig. murmurs, gallops, clicks or rubs. Peripheral pulses normal and equal  without edema.  Abdomen: Soft & bowel sounds normal. Non-tender w/o guarding, rebound, hernias, masses or organomegaly.  Lymphatics: Unremarkable.  Musculoskeletal: Full ROM all peripheral extremities, joint stability, 5/5 strength and normal gait.  Skin: Warm, dry without exposed rashes, lesions or ecchymosis apparent.  Neuro: Cranial nerves intact, reflexes equal bilaterally. Sensory-motor testing grossly intact. Tendon reflexes grossly intact.  Pysch: Alert & oriented x 3.  Insight and judgement nl & appropriate. No ideations.  Assessment and Plan:  1. Essential hypertension  -  monitor blood pressure's - Add bisoprolol 10 MG tablet; Take 1/2 to 1 tablet  for BP  Disp: 90 tab; Rf: 1 - Continue DASH diet.  Reminder to go to the ER if any CP,  SOB, nausea, dizziness, severe HA, changes vision/speech.  - CBC with Differential/Platelet - COMPLETE METABOLIC PANEL WITH  GFR - Magnesium - TSH  2. Hyperlipidemia, mixed  - Continue diet/meds, exercise,& lifestyle modifications.  - Continue monitor periodic cholesterol/liver & renal functions   - Lipid panel - TSH  3. Abnormal glucose  - Continue diet, exercise  - Lifestyle modifications.  - Monitor appropriate labs.  - Hemoglobin A1c - Insulin, random  4. Vitamin D deficiency  - Continue supplementation.  - VITAMIN D 25 Hydroxy  5. Vertigo  - meclizine  25 MG tablet; Take 1/2 to 1 tablet 2 to 3 x /day for Dizziness / Vertigo  Disp: 90 tablet; Refill: 0  6. Medication management  - CBC with Differential/Platelet - COMPLETE  METABOLIC PANEL WITH GFR - Magnesium - Lipid panel - TSH - Hemoglobin A1c - Insulin, random - VITAMIN D 25 Hydroxy        Discussed  regular exercise, BP monitoring, weight control to achieve/maintain BMI less than 25 and discussed med and SE's. Recommended labs to assess and monitor clinical status with further disposition pending results of labs.  I discussed the assessment and treatment plan with the patient. The patient was provided an opportunity to ask questions and all were answered. The patient agreed with the plan and demonstrated an understanding of the instructions.  I provided over 30 minutes of exam, counseling, chart review and  complex critical decision making.  Kirtland Bouchard, MD

## 2019-06-27 NOTE — Patient Instructions (Addendum)
Benign Positional Vertigo Vertigo is the feeling that you or your surroundings are moving when they are not. Benign positional vertigo is the most common form of vertigo. This is usually a harmless condition (benign). This condition is positional. This means that symptoms are triggered by certain movements and positions. This condition can be dangerous if it occurs while you are doing something that could cause harm to you or others. This includes activities such as driving or operating machinery. What are the causes? In many cases, the cause of this condition is not known. It may be caused by a disturbance in an area of the inner ear that helps your brain to sense movement and balance. This disturbance can be caused by:  Viral infection (labyrinthitis).  Head injury.  Repetitive motion, such as jumping, dancing, or running. What increases the risk? You are more likely to develop this condition if:  You are a woman.  You are 50 years of age or older. What are the signs or symptoms? Symptoms of this condition usually happen when you move your head or your eyes in different directions. Symptoms may start suddenly, and usually last for less than a minute. They include:  Loss of balance and falling.  Feeling like you are spinning or moving.  Feeling like your surroundings are spinning or moving.  Nausea and vomiting.  Blurred vision.  Dizziness.  Involuntary eye movement (nystagmus). Symptoms can be mild and cause only minor problems, or they can be severe and interfere with daily life. Episodes of benign positional vertigo may return (recur) over time. Symptoms may improve over time. How is this diagnosed? This condition may be diagnosed based on:  Your medical history.  Physical exam of the head, neck, and ears.  Tests, such as: ? MRI. ? CT scan. ? Eye movement tests. Your health care provider may ask you to change positions quickly while he or she watches you for symptoms  of benign positional vertigo, such as nystagmus. Eye movement may be tested with a variety of exams that are designed to evaluate or stimulate vertigo. ? An electroencephalogram (EEG). This records electrical activity in your brain. ? Hearing tests. You may be referred to a health care provider who specializes in ear, nose, and throat (ENT) problems (otolaryngologist) or a provider who specializes in disorders of the nervous system (neurologist). How is this treated?  This condition may be treated in a session in which your health care provider moves your head in specific positions to adjust your inner ear back to normal. Treatment for this condition may take several sessions. Surgery may be needed in severe cases, but this is rare. In some cases, benign positional vertigo may resolve on its own in 2-4 weeks. Follow these instructions at home: Safety  Move slowly. Avoid sudden body or head movements or certain positions, as told by your health care provider.  Avoid driving until your health care provider says it is safe for you to do so.  Avoid operating heavy machinery until your health care provider says it is safe for you to do so.  Avoid doing any tasks that would be dangerous to you or others if vertigo occurs.  If you have trouble walking or keeping your balance, try using a cane for stability. If you feel dizzy or unstable, sit down right away.  Return to your normal activities as told by your health care provider. Ask your health care provider what activities are safe for you. General instructions  Take over-the-counter   and prescription medicines only as told by your health care provider.  Drink enough fluid to keep your urine pale yellow.  Keep all follow-up visits as told by your health care provider. This is important. Contact a health care provider if:  You have a fever.  Your condition gets worse or you develop new symptoms.  Your family or friends notice any  behavioral changes.  You have nausea or vomiting that gets worse.  You have numbness or a "pins and needles" sensation. Get help right away if you:  Have difficulty speaking or moving.  Are always dizzy.  Faint.  Develop severe headaches.  Have weakness in your legs or arms.  Have changes in your hearing or vision.  Develop a stiff neck.  Develop sensitivity to light. Summary  Vertigo is the feeling that you or your surroundings are moving when they are not. Benign positional vertigo is the most common form of vertigo.  The cause of this condition is not known. It may be caused by a disturbance in an area of the inner ear that helps your brain to sense movement and balance.  Symptoms include loss of balance and falling, feeling that you or your surroundings are moving, nausea and vomiting, and blurred vision.  This condition can be diagnosed based on symptoms, physical exam, and other tests, such as MRI, CT scan, eye movement tests, and hearing tests.  Follow safety instructions as told by your health care provider. You will also be told when to contact your health care provider in case of problems. This information is not intended to replace advice given to you by your health care provider. Make sure you discuss any questions you have with your health care provider. Document Revised: 11/01/2017 Document Reviewed: 11/01/2017 Elsevier Patient Education  [place].   ====================================  Vit D  & Vit C 1,000 mg   are recommended to help protect  against the Covid-19 and other Corona viruses.    Also it's recommended  to take  Zinc 50 mg  to help  protect against the Covid-19   and best place to get  is also on [employer].com  and don't pay more than 6-8 cents /pill !   ===================================== Coronavirus (COVID-19) Are you at risk?  Are you at risk for the Coronavirus (COVID-19)?  To be considered HIGH RISK for Coronavirus  (COVID-19), you have to meet the following criteria:  . Traveled to [place], [place], [place], [place] or [place]; or in the [place] to [place], [place], [place]  . or [place]; and have fever, cough, and shortness of breath within the last 2 weeks of travel OR . Been in close contact with a person diagnosed with COVID-19 within the last 2 weeks and have  . fever, cough,and shortness of breath .  . IF YOU DO NOT MEET THESE CRITERIA, YOU ARE CONSIDERED LOW RISK FOR COVID-19.  What to do if you are HIGH RISK for COVID-19?  Marland Kitchen If you are having a medical emergency, call 911. . Seek medical care right away. Before you go to a doctor's office, urgent care or emergency department, .  call ahead and tell them about your recent travel, contact with someone diagnosed with COVID-19  .  and your symptoms.  . You should receive instructions from your physician's office regarding next steps of care.  . When you arrive at healthcare provider, tell the healthcare staff immediately you have returned from  . visiting [place], [place], [place],  [place] or [place]; or traveled in the [place] to [place], [place],  . [place] or [place] in the last two weeks or you have been in close contact with a person diagnosed with  . COVID-19 in the last 2 weeks.   . Tell the health care staff about your symptoms: fever, cough and shortness of breath. . After you have been seen by a medical provider, you will be either: o Tested for (COVID-19) and discharged home on quarantine except to seek medical care if  o symptoms worsen, and asked to  - Stay home and avoid contact with others until you get your results (4-5 days)  - Avoid travel on public transportation if possible (such as bus, train, or airplane) or o Sent to the Emergency Department by EMS for evaluation, COVID-19 testing  and  o possible admission depending on your condition and test results.  What to do if you are LOW RISK for  COVID-19?  Reduce your risk of any infection by using the same precautions used for avoiding the common cold or flu:  Marland Kitchen Wash your hands often with soap and warm water for at least 20 seconds.  If soap and water are not readily available,  . use an alcohol-based hand sanitizer with at least 60% alcohol.  . If coughing or sneezing, cover your mouth and nose by coughing or sneezing into the elbow areas of your shirt or coat, .  into a tissue or into your sleeve (not your hands). . Avoid shaking hands with others and consider head nods or verbal greetings only. . Avoid touching your eyes, nose, or mouth with unwashed hands.  . Avoid close contact with people who are sick. . Avoid places or events with large numbers of people in one location, like concerts or sporting events. . Carefully consider travel plans you have or are making. . If you are planning any travel outside or inside the [place], visit the CDC's Travelers' Health webpage for the latest health notices. . If you have some symptoms but not all symptoms, continue to monitor at home and seek medical attention  . if your symptoms worsen. . If you are having a medical emergency, call 911.   ++++++++++++++++++++++++++++++++ Recommend Adult Low Dose Aspirin or  coated  Aspirin 81 mg daily  To reduce risk of Colon Cancer 40 %,  Skin Cancer 26 % ,  Melanoma 46%  and  Pancreatic cancer 60% ++++++++++++++++++++++++++++++++ Vitamin D goal  is between 70-100.  Please make sure that you are taking your Vitamin D as directed.  It is very important as a natural anti-inflammatory  helping hair, skin, and nails, as well as reducing stroke and heart attack risk.  It helps your bones and helps with mood. It also decreases numerous cancer risks so please take it as directed.  Low Vit D is associated with a 200-300% higher risk for CANCER  and 200-300% higher risk for HEART   ATTACK  &  STROKE.   .....................................Marland Kitchen It is also  associated with higher death rate at younger ages,  autoimmune diseases like Rheumatoid arthritis, Lupus, Multiple Sclerosis.    Also many other serious conditions, like depression, Alzheimer's Dementia, infertility, muscle aches, fatigue, fibromyalgia - just to name a few. ++++++++++++++++++++ Recommend the book "The END of DIETING" by Dr Excell Seltzer  & the book "The END of DIABETES " by Dr Excell Seltzer At [email or web address] - get book & Audio CD's    Being diabetic  has a  300% increased risk for heart attack, stroke, cancer, and alzheimer- type vascular dementia. It is very important that you work harder with diet by avoiding all foods that are white. Avoid white rice ([place] & wild rice is OK), white potatoes (sweetpotatoes in moderation is OK), White bread or wheat bread or anything made out of white flour like bagels, donuts, rolls, buns, biscuits, cakes, pastries, cookies, pizza crust, and pasta (made from white flour & egg whites) - vegetarian pasta or spinach or wheat pasta is OK. Multigrain breads like Arnold's or Pepperidge Farm, or multigrain sandwich thins or flatbreads.  Diet, exercise and weight loss can reverse and cure diabetes in the early stages.  Diet, exercise and weight loss is very important in the control and prevention of complications of diabetes which affects every system in your body, ie. Brain - dementia/stroke, eyes - glaucoma/blindness, heart - heart attack/heart failure, kidneys - dialysis, stomach - gastric paralysis, intestines - malabsorption, nerves - severe painful neuritis, circulation - gangrene & loss of a leg(s), and finally cancer and Alzheimers.    I recommend avoid fried & greasy foods,  sweets/candy, white rice ([place] or wild rice or Quinoa is OK), white potatoes (sweet potatoes are OK) - anything made from white flour - bagels, doughnuts, rolls, buns, biscuits,white and wheat breads, pizza crust and traditional pasta made of white flour & egg white(vegetarian pasta or  spinach or wheat pasta is OK).  Multi-grain bread is OK - like multi-grain flat bread or sandwich thins. Avoid alcohol in excess. Exercise is also important.    Eat all the vegetables you want - avoid meat, especially red meat and dairy - especially cheese.  Cheese is the most concentrated form of trans-fats which is the worst thing to clog up our arteries. Veggie cheese is OK which can be found in the fresh produce section at [employer] or [employer] or [employer]  +++++++++++++++++++++ DASH Eating Plan  DASH stands for "Dietary Approaches to Stop Hypertension."   The DASH eating plan is a healthy eating plan that has been shown to reduce high blood pressure (hypertension). Additional health benefits may include reducing the risk of type 2 diabetes mellitus, heart disease, and stroke. The DASH eating plan may also help with weight loss. WHAT DO I NEED TO KNOW ABOUT THE DASH EATING PLAN? For the DASH eating plan, you will follow these general guidelines:  Choose foods with a percent daily value for sodium of less than 5% (as listed on the food label).  Use salt-free seasonings or herbs instead of table salt or sea salt.  Check with your health care provider or pharmacist before using salt substitutes.  Eat lower-sodium products, often labeled as "lower sodium" or "no salt added."  Eat fresh foods.  Eat more vegetables, fruits, and low-fat dairy products.  Choose whole grains. Look for the word "whole" as the first word in the ingredient list.  Choose fish   Limit sweets, desserts, sugars, and sugary drinks.  Choose heart-healthy fats.  Eat veggie cheese   Eat more home-cooked food and less restaurant, buffet, and fast food.  Limit fried foods.  Cook foods using methods other than frying.  Limit canned vegetables. If you do use them, rinse them well to decrease the sodium.  When eating at a restaurant, ask that your food be prepared with less salt, or no salt if  possible.  WHAT FOODS CAN I EAT? Read Dr [name] Fuhrman's books on The End of Dieting & The End of Diabetes  Grains Whole grain or whole wheat bread. [place] rice. Whole grain or whole wheat pasta. Quinoa, bulgur, and whole grain cereals. Low-sodium cereals. Corn or whole wheat flour tortillas. Whole grain cornbread. Whole grain crackers. Low-sodium crackers.  Vegetables Fresh or frozen vegetables (raw, steamed, roasted, or grilled). Low-sodium or reduced-sodium tomato and vegetable juices. Low-sodium or reduced-sodium tomato sauce and paste. Low-sodium or reduced-sodium canned vegetables.   Fruits All fresh, canned (in natural juice), or frozen fruits.  Protein Products  All fish and seafood.  Dried beans, peas, or lentils. Unsalted nuts and seeds. Unsalted canned beans.  Dairy Low-fat dairy products, such as skim or 1% milk, 2% or reduced-fat cheeses, low-fat ricotta or cottage cheese, or plain low-fat yogurt. Low-sodium or reduced-sodium cheeses.  Fats and Oils Tub margarines without trans fats. Light or reduced-fat mayonnaise and salad dressings (reduced sodium). Avocado. Safflower, olive, or canola oils. Natural peanut or almond butter.  Other Unsalted popcorn and pretzels. The items listed above may not be a complete list of recommended foods or beverages. Contact your dietitian for more options.  +++++++++++++++  WHAT FOODS ARE NOT RECOMMENDED? Grains/ White flour or wheat flour White bread. White pasta. White rice. Refined cornbread. Bagels and croissants. Crackers that contain trans fat.  Vegetables  Creamed or fried vegetables. Vegetables in a . Regular canned vegetables. Regular canned tomato sauce and paste. Regular tomato and vegetable juices.  Fruits Dried fruits. Canned fruit in light or heavy syrup. Fruit juice.  Meat and Other Protein Products Meat in general - RED meat & White meat.  Fatty cuts of meat. Ribs, chicken wings, all processed  meats as bacon, sausage, bologna, salami, fatback, hot dogs, bratwurst and packaged luncheon meats.  Dairy Whole or 2% milk, cream, half-and-half, and cream cheese. Whole-fat or sweetened yogurt. Full-fat cheeses or blue cheese. Non-dairy creamers and whipped toppings. Processed cheese, cheese spreads, or cheese curds.  Condiments Onion and garlic salt, seasoned salt, table salt, and sea salt. Canned and packaged gravies. Worcestershire sauce. Tartar sauce. Barbecue sauce. Teriyaki sauce. Soy sauce, including reduced sodium. Steak sauce. Fish sauce. Oyster sauce. Cocktail sauce. Horseradish. Ketchup and mustard. Meat flavorings and tenderizers. Bouillon cubes. Hot sauce. Tabasco sauce. Marinades. Taco seasonings. Relishes.  Fats and Oils Butter, stick margarine, lard, shortening and bacon fat. Coconut, palm kernel, or palm oils. Regular salad dressings.  Pickles and olives. Salted popcorn and pretzels.  The items listed above may not be a complete list of foods and beverages to avoid.

## 2019-06-28 ENCOUNTER — Ambulatory Visit: Payer: PPO | Admitting: Internal Medicine

## 2019-06-28 LAB — LIPID PANEL
Cholesterol: 123 mg/dL (ref <200)
HDL: 49 mg/dL — ABNORMAL LOW (ref >=50)
LDL Cholesterol (Calc): 53 mg/dL (calc)
Non-HDL Cholesterol (Calc): 74 mg/dL (calc) (ref <130)
Total CHOL/HDL Ratio: 2.5 (calc) (ref <5.0)
Triglycerides: 125 mg/dL (ref <150)

## 2019-06-28 LAB — CBC WITH DIFFERENTIAL/PLATELET
Absolute Monocytes: 480 cells/uL (ref 200–950)
Basophils Absolute: 60 cells/uL (ref 0–200)
Basophils Relative: 0.8 %
Eosinophils Absolute: 143 cells/uL (ref 15–500)
Eosinophils Relative: 1.9 %
HCT: 36.5 % (ref 35.0–45.0)
Hemoglobin: 12.1 g/dL (ref 11.7–15.5)
Lymphs Abs: 1478 cells/uL (ref 850–3900)
MCH: 29.4 pg (ref 27.0–33.0)
MCHC: 33.2 g/dL (ref 32.0–36.0)
MCV: 88.8 fL (ref 80.0–100.0)
MPV: 10.7 fL (ref 7.5–12.5)
Monocytes Relative: 6.4 %
Neutro Abs: 5340 cells/uL (ref 1500–7800)
Neutrophils Relative %: 71.2 %
Platelets: 278 10*3/uL (ref 140–400)
RBC: 4.11 10*6/uL (ref 3.80–5.10)
RDW: 12.7 % (ref 11.0–15.0)
Total Lymphocyte: 19.7 %
WBC: 7.5 10*3/uL (ref 3.8–10.8)

## 2019-06-28 LAB — COMPLETE METABOLIC PANEL WITH GFR
AG Ratio: 1.8 (calc) (ref 1.0–2.5)
ALT: 12 U/L (ref 6–29)
AST: 14 U/L (ref 10–35)
Albumin: 4.2 g/dL (ref 3.6–5.1)
Alkaline phosphatase (APISO): 65 U/L (ref 37–153)
BUN: 15 mg/dL (ref 7–25)
CO2: 30 mmol/L (ref 20–32)
Calcium: 9.6 mg/dL (ref 8.6–10.4)
Chloride: 107 mmol/L (ref 98–110)
Creat: 0.66 mg/dL (ref 0.60–0.93)
GFR, Est African American: 97 mL/min/{1.73_m2} (ref >=60)
GFR, Est Non African American: 84 mL/min/{1.73_m2} (ref >=60)
Globulin: 2.3 g/dL (calc) (ref 1.9–3.7)
Glucose, Bld: 111 mg/dL — ABNORMAL HIGH (ref 65–99)
Potassium: 4 mmol/L (ref 3.5–5.3)
Sodium: 143 mmol/L (ref 135–146)
Total Bilirubin: 0.5 mg/dL (ref 0.2–1.2)
Total Protein: 6.5 g/dL (ref 6.1–8.1)

## 2019-06-28 LAB — TSH: TSH: 1.68 mIU/L (ref 0.40–4.50)

## 2019-06-28 LAB — INSULIN, RANDOM: Insulin: 23.4 u[IU]/mL — ABNORMAL HIGH

## 2019-06-28 LAB — VITAMIN D 25 HYDROXY (VIT D DEFICIENCY, FRACTURES): Vit D, 25-Hydroxy: 103 ng/mL — ABNORMAL HIGH (ref 30–100)

## 2019-06-28 LAB — HEMOGLOBIN A1C
Hgb A1c MFr Bld: 6.1 % of total Hgb — ABNORMAL HIGH (ref <5.7)
Mean Plasma Glucose: 128 (calc)
eAG (mmol/L): 7.1 (calc)

## 2019-06-28 LAB — MAGNESIUM: Magnesium: 1.9 mg/dL (ref 1.5–2.5)

## 2019-06-30 ENCOUNTER — Encounter: Payer: Self-pay | Admitting: Internal Medicine

## 2019-07-09 DIAGNOSIS — M25511 Pain in right shoulder: Secondary | ICD-10-CM | POA: Diagnosis not present

## 2019-07-31 ENCOUNTER — Encounter: Payer: Self-pay | Admitting: Internal Medicine

## 2019-07-31 ENCOUNTER — Ambulatory Visit (INDEPENDENT_AMBULATORY_CARE_PROVIDER_SITE_OTHER): Payer: PPO | Admitting: Internal Medicine

## 2019-07-31 ENCOUNTER — Other Ambulatory Visit: Payer: Self-pay

## 2019-07-31 VITALS — BP 162/86 | HR 52 | Temp 97.0°F | Resp 16 | Ht 65.0 in | Wt 168.8 lb

## 2019-07-31 DIAGNOSIS — I10 Essential (primary) hypertension: Secondary | ICD-10-CM | POA: Diagnosis not present

## 2019-07-31 MED ORDER — OLMESARTAN MEDOXOMIL 40 MG PO TABS: ORAL_TABLET | ORAL | 3 refills | Status: DC

## 2019-07-31 NOTE — Progress Notes (Signed)
Subjective:    Patient ID: Kristen [name], female    DOB: June 27, 1940, 79 y.o.   MRN: [id number]  HPI   Patient is a very nice 79 yo WWF returning for 80 month f/u after adding 1/2 -1 Bisoprolol for BP 152/80 - rechecked at 172/99.  She continued the Amlodipine 2.5 mg and Losartan 50 mg - she reports her BP's continue to run high in the range 160's /80-90's. She denies abn=ny HT sx's.     Also noted that her A1c has gone up from 5.7%to 6.3%. Patient is counseled in prudent low glycemic diet.  Outpatient Medications Prior to Visit  Medication Sig Dispense Refill  . amLODipine (NORVASC) 2.5 MG tablet Take 1 tablet once daily for blood pressure. 90 tablet 0  . aspirin EC 81 MG tablet Take 81 mg by mouth daily.    Marland Kitchen atorvastatin (LIPITOR) 80 MG tablet Take 1 tablet Daily for Cholesterol 90 tablet 1  . bisoprolol (ZEBETA) 10 MG tablet Take 1/2 to 1 tablet every Morning for BP 90 tablet 1  . CHOLECALCIFEROL PO Take 5,000 Units by mouth in the morning, at noon, and at bedtime.     . gabapentin (NEURONTIN) 100 MG capsule Take 1-3 caps up to three times a day as needed for burning nerve pain. May cause drowsiness. 270 capsule 1  . losartan (COZAAR) 50 MG tablet Take 1 tablet Daily for BP 90 tablet 1  . meclizine (ANTIVERT) 25 MG tablet Take 1/2 to 1 tablet 2 to 3 x /day as needed for Dizziness / Vertigo 90 tablet 0  . meloxicam (MOBIC) 15 MG tablet Take 1/2 to 1 tablet daily with  Food for pain  & Inflammation- try to limit to max 5 days   /week (Patient taking differently: as needed. Take 1/2 to 1 tablet daily with  Food for pain  & Inflammation- try to limit to max 5 days   /week) 90 tablet 0  . pantoprazole (PROTONIX) 40 MG tablet Take 1 tablet Daily for Acid Indigestion & Reflux 90 tablet 3  . PARoxetine (PAXIL) 30 MG tablet Take 1 tablet Daily for Mood 90 tablet 3  . lidocaine (LIDODERM) 5 % Place 1 patch onto the skin daily. Remove & Discard patch within 12 hours or as directed by MD 30 patch  0   No facility-administered medications prior to visit.   Allergies  Allergen Reactions  . Aspirin Other (See Comments)    High Doses stomach trouble  . Codeine Nausea And Vomiting  . Fenofibrate Cough  . Hydrochlorothiazide Other (See Comments)    Fatigue  . Metoclopramide Other (See Comments)    Sedation   Past Medical History:  Diagnosis Date  . Arthritis   . Colon cancer ([place]) 1990s, 2014  . Colon polyps   . Depression   . Dysrhythmia 04/2012   SVT - RATE 145 POST OP CRANIOTOMY SURGERY --TX'D AND RESOLVED--PT STATES NO FURTHER PROBLEM THAT SHE IS AWARE OF -SHE HAS NOT HAD TO SEE CARDIOLOGIST  . GERD (gastroesophageal reflux disease)   . Headache(784.0)    hx migraines  . Hypertension    pt states she has not taken any b/p medication since her brain surgery in oct 3013--took herself off the medication  . Pituitary mass ([place]) NOV 2013   MASS FOUND AFTER PT EXPERIENCED BILATERAL BLURRED / HAZY VISION -ESP LEFT EYE.  S/P CRANIOTOMY AND EYESIGHT RETURNED TO NORMAL--PT STILL HAS A LOT OF TENDERNESS RIGHT SIDE OF NOSE --  THE CRANIOTOMY WAS DONE BY TRANSNASAL APPROACH  . Pneumonia yrs ago   hx of pneumonia  . PONV (postoperative nausea and vomiting)    PT STATES PLEASE NOTE SHE HAS TENDERNESS RIGHT SIDE OF NOSE - SINCE TRANSNASAL CRANIOTOMY 04/2012  . Prediabetes   . Vitamin D deficiency     Review of Systems   10 point systems review negative except as above.    Objective:   Physical Exam  BP (!) 162/86   Pulse (!) 52   Temp (!) 97 F (36.1 C)   Resp 16   Ht 5\' 5"  (1.651 m)   Wt 168 lb 12.8 oz (76.6 kg)   BMI 28.09 kg/m   HEENT - WNL. Neck - supple.  Chest - Clear equal BS. Cor - Nl HS. RRR w/o sig MGR. PP 1(+). No edema. MS- FROM w/o deformities.  Gait Nl. Neuro -  Nl w/o focal abnormalities.    Assessment & Plan:    1. Essential hypertension  - D/C Losartan  - olmesartan 40 MG; Take 1 tablet Daily for BP -   Disp: 90 tab; Rf: 3  - Discussed Meds &  SE's.

## 2019-08-07 ENCOUNTER — Other Ambulatory Visit: Payer: Self-pay

## 2019-08-07 DIAGNOSIS — M25511 Pain in right shoulder: Secondary | ICD-10-CM | POA: Diagnosis not present

## 2019-08-15 ENCOUNTER — Telehealth: Payer: Self-pay | Admitting: *Deleted

## 2019-08-15 NOTE — Telephone Encounter (Signed)
Records faxed to [facility] for upcoming surgery.

## 2019-08-22 ENCOUNTER — Other Ambulatory Visit: Payer: Self-pay

## 2019-08-22 ENCOUNTER — Encounter: Payer: Self-pay | Admitting: Adult Health

## 2019-08-22 ENCOUNTER — Ambulatory Visit: Payer: PPO | Admitting: Adult Health

## 2019-08-22 DIAGNOSIS — K0889 Other specified disorders of teeth and supporting structures: Secondary | ICD-10-CM | POA: Diagnosis not present

## 2019-08-22 MED ORDER — AMOXICILLIN 500 MG PO TABS: 500.0000 mg | ORAL_TABLET | Freq: Three times a day (TID) | ORAL | 0 refills | Status: DC

## 2019-08-22 NOTE — Progress Notes (Signed)
Virtual Visit via Telephone Note  I connected with Kristen [name] on 08/22/19 at 10:30 AM EDT by telephone and verified that I am speaking with the correct person using two identifiers.  Location: Patient: home  Provider: [place] office    I discussed the limitations, risks, security and privacy concerns of performing an evaluation and management service by telephone and the availability of in person appointments. I also discussed with the patient that there may be a patient responsible charge related to this service. The patient expressed understanding and agreed to proceed.   History of Present Illness:  There were no vitals taken for this visit.  79 y.o. female requesting antibiotic for "tooth abscess" - she reports 1 week ago she cracked/broke left bottom tooth 4th from back, no particular event, hadn't been to a dentist in several decades; she reports has had lots of swelling, pain in jaw; has been using ibuprofen 400 mg around to clock to manage. She is requesting treatment for possible infection as she searches for new dentist. She denies purulent drainage, foul taste in mouth, sore throat, fever/chills. She is concerned a dentist will not "do anything" until any infection is treated.    Allergies:  Allergies  Allergen Reactions  . Aspirin Other (See Comments)    High Doses stomach trouble  . Codeine Nausea And Vomiting  . Fenofibrate Cough  . Hydrochlorothiazide Other (See Comments)    Fatigue  . Metoclopramide Other (See Comments)    Sedation   Medical History:  has SVT (supraventricular tachycardia) ([place]); GERD (gastroesophageal reflux disease); Major depression in remission ([place]); Overweight (BMI 25.0-29.9); History of colon cancer, stage II; Vitamin D deficiency; Prediabetes; Hyperlipidemia, mixed; Medication management; Pituitary tumor (excised 2013); Essential hypertension; and Abnormal glucose on their problem list.   Surgical History:  She  has a past surgical  history that includes Colon resection (1988); Colonoscopy (09/20/2011); Hot hemostasis (09/20/2011); Cataract extraction (June/july 2013); Colonoscopy (12/15/2011); Colonoscopy (01/26/2012); Eye surgery; Tumor removal; Craniotomy (04/18/2012); Transnasal approach (04/18/2012); Laparoscopic right hemi colectomy (06/27/2012); Cystoscopy with ureteroscopy and stent placement (Bilateral, 10/01/2015); Back surgery; Colon surgery (1990's); Cystoscopy with ureteroscopy and stent placement (Right, 10/19/2015); and Holmium laser application (Right, [id number]).   Social History:   reports that she has never smoked. She has never used smokeless tobacco. She reports that she does not drink alcohol or use drugs.    Observations/Objective:  General : Well sounding patient in no apparent distress HEENT: no hoarseness, no cough for duration of visit Lungs: speaks in complete sentences, no audible wheezing, no apparent distress Neurological: alert, oriented x 3 Psychiatric: pleasant, judgement appropriate    Assessment and Plan:  Trinh was seen today for dental pain.  Diagnoses and all orders for this visit:  Tooth pain Will do presumptive treatment for infection, however strongly emphasized need to schedule evaluation by dentist to fully address this issue.  She is in agreement and plans to call to schedule an appointment within the week Emphasized should sx become worse, fever/chills, headache, etc may need to be seen urgently here vs dentist Can try ice application, rotate OTC tylenol/NSAID for pain PRN -     amoxicillin (AMOXIL) 500 MG tablet; Take 1 tablet (500 mg total) by mouth 3 (three) times daily. 10 days   Follow Up Instructions:    I discussed the assessment and treatment plan with the patient. The patient was provided an opportunity to ask questions and all were answered. The patient agreed with the plan and demonstrated an  understanding of the instructions.   The patient was advised to  call back or seek an in-person evaluation if the symptoms worsen or if the condition fails to improve as anticipated.  I provided 15 minutes of non-face-to-face time during this encounter.   Izora Ribas, NP  Future Appointments  Date Time Provider [place]  11/18/2019  2:00 PM Unk Pinto, MD [id number] None

## 2019-10-10 ENCOUNTER — Other Ambulatory Visit: Payer: Self-pay | Admitting: Internal Medicine

## 2019-10-10 ENCOUNTER — Telehealth: Payer: Self-pay | Admitting: *Deleted

## 2019-10-10 DIAGNOSIS — I10 Essential (primary) hypertension: Secondary | ICD-10-CM

## 2019-10-10 DIAGNOSIS — K219 Gastro-esophageal reflux disease without esophagitis: Secondary | ICD-10-CM

## 2019-10-10 MED ORDER — PANTOPRAZOLE SODIUM 40 MG PO TBEC: DELAYED_RELEASE_TABLET | ORAL | 0 refills | Status: DC

## 2019-10-10 MED ORDER — AMLODIPINE BESYLATE 2.5 MG PO TABS: ORAL_TABLET | ORAL | 0 refills | Status: DC

## 2019-10-10 NOTE — Telephone Encounter (Signed)
Patient called and requested the names of the blood pressure medications she should be taking. Patient was informed to take Amlodipine, Bisoprolol, and Olmesartan, but was told to discontinue Losartan. Patient states she is taking the correct BP medications.

## 2019-11-17 ENCOUNTER — Encounter: Payer: Self-pay | Admitting: Internal Medicine

## 2019-11-17 NOTE — Progress Notes (Addendum)
  C  A  N  C  E  L  L  E  D   At app't time despite Letter   & phone confirmation

## 2019-11-18 ENCOUNTER — Ambulatory Visit: Payer: PPO | Admitting: Internal Medicine

## 2019-11-18 DIAGNOSIS — Z0000 Encounter for general adult medical examination without abnormal findings: Secondary | ICD-10-CM

## 2019-11-21 ENCOUNTER — Other Ambulatory Visit: Payer: Self-pay | Admitting: Internal Medicine

## 2019-11-27 ENCOUNTER — Other Ambulatory Visit: Payer: Self-pay | Admitting: Internal Medicine

## 2020-01-07 ENCOUNTER — Encounter (HOSPITAL_COMMUNITY): Payer: Self-pay

## 2020-01-07 ENCOUNTER — Other Ambulatory Visit: Payer: Self-pay

## 2020-01-07 ENCOUNTER — Emergency Department (HOSPITAL_COMMUNITY)
Admission: EM | Admit: 2020-01-07 | Discharge: 2020-01-07 | Disposition: A | Discharge status: Home / Self Care | Payer: PPO | Attending: Emergency Medicine | Admitting: Emergency Medicine

## 2020-01-07 DIAGNOSIS — Y999 Unspecified external cause status: Secondary | ICD-10-CM | POA: Insufficient documentation

## 2020-01-07 DIAGNOSIS — Y939 Activity, unspecified: Secondary | ICD-10-CM | POA: Diagnosis not present

## 2020-01-07 DIAGNOSIS — Y92017 Garden or yard in single-family (private) house as the place of occurrence of the external cause: Secondary | ICD-10-CM | POA: Diagnosis not present

## 2020-01-07 DIAGNOSIS — S80862A Insect bite (nonvenomous), left lower leg, initial encounter: Secondary | ICD-10-CM | POA: Diagnosis not present

## 2020-01-07 DIAGNOSIS — M79605 Pain in left leg: Secondary | ICD-10-CM | POA: Diagnosis not present

## 2020-01-07 DIAGNOSIS — Z79899 Other long term (current) drug therapy: Secondary | ICD-10-CM | POA: Diagnosis not present

## 2020-01-07 DIAGNOSIS — I10 Essential (primary) hypertension: Secondary | ICD-10-CM | POA: Diagnosis not present

## 2020-01-07 DIAGNOSIS — R52 Pain, unspecified: Secondary | ICD-10-CM | POA: Diagnosis not present

## 2020-01-07 DIAGNOSIS — W57XXXA Bitten or stung by nonvenomous insect and other nonvenomous arthropods, initial encounter: Secondary | ICD-10-CM | POA: Diagnosis not present

## 2020-01-07 DIAGNOSIS — Z7982 Long term (current) use of aspirin: Secondary | ICD-10-CM | POA: Insufficient documentation

## 2020-01-07 DIAGNOSIS — R42 Dizziness and giddiness: Secondary | ICD-10-CM | POA: Diagnosis not present

## 2020-01-07 DIAGNOSIS — Z85038 Personal history of other malignant neoplasm of large intestine: Secondary | ICD-10-CM | POA: Diagnosis not present

## 2020-01-07 DIAGNOSIS — R0902 Hypoxemia: Secondary | ICD-10-CM | POA: Diagnosis not present

## 2020-01-07 DIAGNOSIS — T7840XA Allergy, unspecified, initial encounter: Secondary | ICD-10-CM | POA: Diagnosis not present

## 2020-01-07 MED ORDER — CEPHALEXIN 500 MG PO CAPS: 500.0000 mg | ORAL_CAPSULE | Freq: Two times a day (BID) | ORAL | 0 refills | Status: AC

## 2020-01-07 NOTE — Discharge Instructions (Signed)
You have been seen here for a bug bite. Your exam and vitals look reassuring. I prescribed you antibiotics please take as prescribed. You may also take Benadryl or Claritin as this can help with itchiness and swelling of your left leg. You can take over-the-counter pain medications like ibuprofen or Tylenol please follow directions on the back of bottle.  I want you to follow-up with your primary care doctor in 1 week's time if symptoms have not improved.  I want you to come back to the emergency department if you develop severe leg pain, increased leg swelling, increased redness, pus draining from the wound, fever, chills, shortness of breath, chest pain, uncontrolled nausea, vomiting, diarrhea as the symptoms require further evaluation management.

## 2020-01-07 NOTE — ED Provider Notes (Signed)
[facility] EMERGENCY DEPARTMENT Provider Note   CSN: [id number] Arrival date & time: 01/07/20  3536     History Chief Complaint  Patient presents with  . Insect Bite    Kristen [name] is a 79 y.o. female.  HPI   Patient presents to the emergency department with chief complaint of bug bite on her left leg.  Patient states while she was in the garden on Saturday she was bitten by a bug and since then she has had a increasing red mark on the dorsal side of her lower leg.  Patient admits that the bite was itchy at first but now has become more painful and states it is a stinging sensation.  She admits that the redness has gotten larger as well she notes some swelling in her left leg.  She denies fever, chills, nausea, vomiting, diffuse rash over her body, difficulty breathing, throat swelling.  She has never had this happen to her before, has no known anaphylactic type reactions.  She has not been taking anything for pain.  She denies any alleviating or aggravating factors.  She has significant medical history of arthritis, colon cancer, GERD, hypertension.  Patient denies headache, fever, chills, shortness of breath, chest pain, abdominal pain, nausea, vomiting, diarrhea.  Past Medical History:  Diagnosis Date  . Arthritis   . Colon cancer ([place]) 1990s, 2014  . Colon polyps   . Depression   . Dysrhythmia 04/2012   SVT - RATE 145 POST OP CRANIOTOMY SURGERY --TX'D AND RESOLVED--PT STATES NO FURTHER PROBLEM THAT SHE IS AWARE OF -SHE HAS NOT HAD TO SEE CARDIOLOGIST  . GERD (gastroesophageal reflux disease)   . Headache(784.0)    hx migraines  . Hypertension    pt states she has not taken any b/p medication since her brain surgery in oct 3013--took herself off the medication  . Pituitary mass ([place]) NOV 2013   MASS FOUND AFTER PT EXPERIENCED BILATERAL BLURRED / HAZY VISION -ESP LEFT EYE.  S/P CRANIOTOMY AND EYESIGHT RETURNED TO NORMAL--PT STILL HAS A LOT OF TENDERNESS RIGHT SIDE OF NOSE  --THE CRANIOTOMY WAS DONE BY TRANSNASAL APPROACH  . Pneumonia yrs ago   hx of pneumonia  . PONV (postoperative nausea and vomiting)    PT STATES PLEASE NOTE SHE HAS TENDERNESS RIGHT SIDE OF NOSE - SINCE TRANSNASAL CRANIOTOMY 04/2012  . Prediabetes   . Vitamin D deficiency     Patient Active Problem List   Diagnosis Date Noted  . Abnormal glucose 06/25/2019  . Essential hypertension 08/19/2014  . Pituitary tumor (excised 2013) 03/12/2014  . Medication management 09/10/2013  . Hyperlipidemia, mixed 06/11/2013  . Vitamin D deficiency   . Prediabetes   . History of colon cancer, stage II 10/12/2012  . Overweight (BMI 25.0-29.9) 07/01/2012  . GERD (gastroesophageal reflux disease)   . Major depression in remission ([place])   . SVT (supraventricular tachycardia) ([place]) 04/21/2012    Past Surgical History:  Procedure Laterality Date  . BACK SURGERY     lower  . CATARACT EXTRACTION  June/july 2013   bilateral eyes  . COLON RESECTION  1988  . COLON SURGERY  1990's  . COLONOSCOPY  09/20/2011   Procedure: COLONOSCOPY;  Surgeon: Missy Sabins, MD;  Location: [facility];  Service: Endoscopy;  Laterality: N/A;  . COLONOSCOPY  12/15/2011   Procedure: COLONOSCOPY;  Surgeon: Missy Sabins, MD;  Location: [place];  Service: Endoscopy;  Laterality: N/A;  . COLONOSCOPY  01/26/2012   Procedure: COLONOSCOPY;  Surgeon: Cleotis Nipper, MD;  Location: [facility];  Service: Endoscopy;  Laterality: N/A;  . CRANIOTOMY  04/18/2012   Procedure: CRANIOTOMY HYPOPHYSECTOMY TRANSNASAL APPROACH;  Surgeon: Ophelia Charter, MD;  Location: [place];  Service: Neurosurgery;  Laterality: Bilateral;  Transphenoidal resection of Pituitary tumor.   . CYSTOSCOPY WITH URETEROSCOPY AND STENT PLACEMENT Bilateral 10/01/2015   Procedure: CYSTOSCOPY, retrograde WITH URETEROSCOPY AND STENT PLACEMENT, ;  Surgeon: Raynelle Bring, MD;  Location: [facility];  Service: Urology;  Laterality: Bilateral;  . CYSTOSCOPY WITH  URETEROSCOPY AND STENT PLACEMENT Right 10/19/2015   Procedure: CYSTOSCOPY,  RIGHT URETEROSCOPY, HOLMIUM LASER RIGHT URETERAL STONE, RIGHT URETERAL STENT PLACEMENT, LEFT URETEROSCOPY, INSERTION LEFT URETERAL STENT;  Surgeon: Raynelle Bring, MD;  Location: [facility];  Service: Urology;  Laterality: Right;  . EYE SURGERY     cataract bil  . HOLMIUM LASER APPLICATION Right 1/47/8295   Procedure: HOLMIUM LASER  ;  Surgeon: Raynelle Bring, MD;  Location: [facility];  Service: Urology;  Laterality: Right;  . HOT HEMOSTASIS  09/20/2011   Procedure: HOT HEMOSTASIS (ARGON PLASMA COAGULATION/BICAP);  Surgeon: Missy Sabins, MD;  Location: [facility];  Service: Endoscopy;  Laterality: N/A;  . LAPAROSCOPIC RIGHT HEMI COLECTOMY  06/27/2012   Procedure: LAPAROSCOPIC RIGHT HEMI COLECTOMY;  Surgeon: Pedro Earls, MD;  Location: [facility];  Service: General;  Laterality: N/A;  Laparoscopic Assited Right Hemicolectomy  . TRANSNASAL APPROACH  04/18/2012   Procedure: TRANSNASAL APPROACH;  Surgeon: Rozetta Nunnery, MD;  Location: [facility];  Service: ENT;  Laterality: Bilateral;  Transnasal approach  . TUMOR REMOVAL     Left leg     OB History    Gravida  4   Para  3   Term  3   Preterm      AB  1   Living        SAB  1   TAB      Ectopic      Multiple      Live Births              Family History  Problem Relation Age of Onset  . Colon cancer Sister 90  . Heart disease Mother   . Heart disease Father   . Cancer Maternal Aunt        unknown form of cancer dx in her 25s-50s  . Liver cancer Maternal Uncle        heavy ETOH user  . Breast cancer Sister 65  . Dementia Sister   . Colon cancer Cousin   . Colon polyps Son   . Colon polyps Son   . Colon cancer Other 30  . Colon polyps Other   . Brain cancer Cousin        dx in his 49s; paternal cousin    Social History   Tobacco Use  . Smoking status: Never Smoker  . Smokeless tobacco: Never Used  Vaping Use  . Vaping Use: Never  used  Substance Use Topics  . Alcohol use: No    Comment: a couple times a year, one drink at a time  . Drug use: No    Home Medications Prior to Admission medications   Medication Sig Start Date End Date Taking? Authorizing Provider  amLODipine (NORVASC) 2.5 MG tablet Take 1 tablet Daily for BP 10/10/19   Unk Pinto, MD  aspirin EC 81 MG tablet Take 81 mg by mouth daily.    [provider]  atorvastatin (  LIPITOR) 80 MG tablet Take 1 tablet Daily for Cholesterol 03/09/19   Unk Pinto, MD  bisoprolol (ZEBETA) 10 MG tablet Take 1/2 to 1 tablet every Morning for BP 06/27/19   Unk Pinto, MD  cephALEXin (KEFLEX) 500 MG capsule Take 1 capsule (500 mg total) by mouth 2 (two) times daily for 7 days. 01/07/20 01/14/20  Marcello Fennel, PA-C  CHOLECALCIFEROL PO Take 5,000 Units by mouth in the morning, at noon, and at bedtime.     [provider]  gabapentin (NEURONTIN) 100 MG capsule Take 1 to 3 capsules Daily as needed for Neuropathy Pains 11/21/19   Unk Pinto, MD  meclizine (ANTIVERT) 25 MG tablet Take 1/2 to 1 tablet 2 to 3 x /day as needed for Dizziness / Vertigo 06/27/19   Unk Pinto, MD  meloxicam (MOBIC) 15 MG tablet Take 1/2 to 1 tablet daily with  Food for pain  & Inflammation- try to limit to max 5 days   /week Patient taking differently: as needed. Take 1/2 to 1 tablet daily with  Food for pain  & Inflammation- try to limit to max 5 days   /week 07/13/18   Unk Pinto, MD  olmesartan [facility]) 40 MG tablet Take 1 tablet Daily for BP 07/31/19   Unk Pinto, MD  pantoprazole (PROTONIX) 40 MG tablet Take 1 tablet Daily for Indigestion & Acid Reflux 10/10/19   Unk Pinto, MD  PARoxetine (PAXIL) 30 MG tablet Take 1 tablet Daily for Mood 11/03/18   Unk Pinto, MD    Allergies    Aspirin, Codeine, Fenofibrate, Hydrochlorothiazide, and Metoclopramide  Review of Systems   Review of Systems  Constitutional: Negative for chills and  fever.  HENT: Negative for congestion, tinnitus and trouble swallowing.   Eyes: Negative for visual disturbance.  Respiratory: Negative for cough and shortness of breath.   Cardiovascular: Negative for chest pain and palpitations.  Gastrointestinal: Negative for abdominal pain, diarrhea, nausea and vomiting.  Genitourinary: Negative for enuresis and pelvic pain.  Musculoskeletal: Negative for back pain and joint swelling.  Skin: Positive for rash.       Admits to a bug bite that has increasingly getting more red  Neurological: Negative for dizziness and headaches.  Hematological: Does not bruise/bleed easily.    Physical Exam Updated Vital Signs BP 107/61 (BP Location: Right Arm)   Pulse 62   Temp 98.6 F (37 C) (Oral)   Resp 18   Ht 5\' 6"  (1.676 m)   Wt 72.6 kg   SpO2 98%   BMI 25.82 kg/m   Physical Exam Vitals and nursing note reviewed.  Constitutional:      General: She is not in acute distress.    Appearance: She is not ill-appearing.  HENT:     Head: Normocephalic and atraumatic.     Nose: No congestion.     Mouth/Throat:     Mouth: Mucous membranes are moist.     Pharynx: Oropharynx is clear.  Eyes:     General: No scleral icterus. Cardiovascular:     Rate and Rhythm: Normal rate and regular rhythm.     Pulses: Normal pulses.     Heart sounds: No murmur heard.  No friction rub. No gallop.   Pulmonary:     Effort: No respiratory distress.     Breath sounds: No wheezing, rhonchi or rales.  Abdominal:     General: There is no distension.     Tenderness: There is no abdominal tenderness. There is no guarding.  Musculoskeletal:        General: No swelling.  Skin:    General: Skin is warm and dry.     Capillary Refill: Capillary refill takes less than 2 seconds.     Findings: No rash.     Comments: Patient's left leg was visualized, there is a small abrasion noted on the left lower dorsal side of her leg.  There was erythema around the area as well as some  swelling noted.  No purulent discharge, drainage, induration or fluctuance felt on exam.  It was tender to palpation, she had good pedal pulses as well as good capillary refill.  Neurological:     Mental Status: She is alert and oriented to person, place, and time.  Psychiatric:        Mood and Affect: Mood normal.       ED Results / Procedures / Treatments   Labs (all labs ordered are listed, but only abnormal results are displayed) Labs Reviewed - No data to display  EKG None  Radiology No results found.  Procedures Procedures (including critical care time)  Medications Ordered in ED Medications - No data to display  ED Course  I have reviewed the triage vital signs and the nursing notes.  Pertinent labs & imaging results that were available during my care of the patient were reviewed by me and considered in my medical decision making (see chart for details).    MDM Rules/Calculators/A&P                          I have personally reviewed all imaging, labs and have interpreted them.  Patient was nontoxic-appearing on exam, resting comfortably showing no acute signs of distress.  I have low suspicion for a systemic infection as patient's vital signs were reassuring, physical exam was benign no indication for further lab work or imaging.  I have low suspicion for anaphylactic shock as vital signs reassuring, there is no diffuse rash noted,  patient was not having any difficulty swallowing or controlling her own secretions, no swelling of the throat or tongue noted.  Likely patient is suffering a reaction from a bug bite but with the increased swelling and pain could be the beginning of cellulitis.  Will place patient on antibiotics and have her follow-up with her primary care doctor in a week's time.  Patient appears to be resting company in bed show no acute signs distress.  Vital signs have remained stable does not meet criteria to be admitted to the hospital.  Likely  patient suffering from reaction from a bug bite but will place her on antibiotics to cover possible early cellulitis.  Patient was discussed with attending who agrees assessment plan.  Patient was given at home care as well as strict return precautions.  Patient verbalized that she understood and agree with said plan. Final Clinical Impression(s) / ED Diagnoses Final diagnoses:  Insect bite of left lower leg, initial encounter    Rx / DC Orders ED Discharge Orders         Ordered    cephALEXin (KEFLEX) 500 MG capsule  2 times daily     Discontinue  Reprint     01/07/20 1241           Marcello Fennel, PA-C 01/07/20 1416    Daleen Bo, MD 01/08/20 [id number]

## 2020-01-07 NOTE — ED Triage Notes (Signed)
Pt brought in  By EMS due to red, swollen left calf. Pt reports she was bit by a bug and scratched by a feral cat on Saturday area has increasingly became red and more painful

## 2020-01-15 ENCOUNTER — Encounter: Payer: Self-pay | Admitting: Internal Medicine

## 2020-01-15 NOTE — Patient Instructions (Signed)
Due to recent changes in healthcare laws, you may see the results of your imaging and laboratory studies on MyChart before your provider has had a chance to review them.  We understand that in some cases there may be results that are confusing or concerning to you. Not all laboratory results come back in the same time frame and the provider may be waiting for multiple results in order to interpret others.  Please give [place] 48 hours in order for your provider to thoroughly review all the results before contacting the office for clarification of your results.   +++++++++++++++++++++++++  Vit D  & Vit C 1,000 mg   are recommended to help protect  against the Covid-19 and other Corona viruses.    Also it's recommended  to take  Zinc 50 mg  to help  protect against the Covid-19   and best place to get  is also on [email or web address]  and don't pay more than 6-8 cents /pill !  ================================ Coronavirus (COVID-19) Are you at risk?  Are you at risk for the Coronavirus (COVID-19)?  To be considered HIGH RISK for Coronavirus (COVID-19), you have to meet the following criteria:  . Traveled to [place], [place], [place], [place] or [place]; or in the [place] to [place], [place], [place]  . or [place]; and have fever, cough, and shortness of breath within the last 2 weeks of travel OR . Been in close contact with a person diagnosed with COVID-19 within the last 2 weeks and have  . fever, cough,and shortness of breath .  . IF YOU DO NOT MEET THESE CRITERIA, YOU ARE CONSIDERED LOW RISK FOR COVID-19.  What to do if you are HIGH RISK for COVID-19?  Marland Kitchen If you are having a medical emergency, call 911. . Seek medical care right away. Before you go to a doctor's office, urgent care or emergency department, .  call ahead and tell them about your recent travel, contact with someone diagnosed with COVID-19  .  and your symptoms.  . You should receive instructions from your physician's  office regarding next steps of care.  . When you arrive at healthcare provider, tell the healthcare staff immediately you have returned from  . visiting [place], [place], [place], [place] or [place]; or traveled in the [place] to [place], [place],  . [place] or [place] in the last two weeks or you have been in close contact with a person diagnosed with  . COVID-19 in the last 2 weeks.   . Tell the health care staff about your symptoms: fever, cough and shortness of breath. . After you have been seen by a medical provider, you will be either: o Tested for (COVID-19) and discharged home on quarantine except to seek medical care if  o symptoms worsen, and asked to  - Stay home and avoid contact with others until you get your results (4-5 days)  - Avoid travel on public transportation if possible (such as bus, train, or airplane) or o Sent to the Emergency Department by EMS for evaluation, COVID-19 testing  and  o possible admission depending on your condition and test results.  What to do if you are LOW RISK for COVID-19?  Reduce your risk of any infection by using the same precautions used for avoiding the common cold or flu:  Marland Kitchen Wash your hands often with soap and warm water for at least 20 seconds.  If soap and water are not readily  available,  . use an alcohol-based hand sanitizer with at least 60% alcohol.  . If coughing or sneezing, cover your mouth and nose by coughing or sneezing into the elbow areas of your shirt or coat, .  into a tissue or into your sleeve (not your hands). . Avoid shaking hands with others and consider head nods or verbal greetings only. . Avoid touching your eyes, nose, or mouth with unwashed hands.  . Avoid close contact with people who are sick. . Avoid places or events with large numbers of people in one location, like concerts or sporting events. . Carefully consider travel plans you have or are making. . If you are planning any travel outside or  inside the [place], visit the CDC's Travelers' Health webpage for the latest health notices. . If you have some symptoms but not all symptoms, continue to monitor at home and seek medical attention  . if your symptoms worsen. . If you are having a medical emergency, call 911.   . >>>>>>>>>>>>>>>>>>>>>>>>>>>>>>>>> . We Do NOT Approve of  [facility], [employer]  Our Patients  To Do Home Visits & We Do NOT Approve of [employer] > > > > > > > > > > > > > > > > > > > > > > > > > > > > > > > > > > > > > > >  Preventive Care for Adults  A healthy lifestyle and preventive care can promote health and wellness. Preventive health guidelines for women include the following key practices.  A routine yearly physical is a good way to check with your health care provider about your health and preventive screening. It is a chance to share any concerns and updates on your health and to receive a thorough exam.  Visit your dentist for a routine exam and preventive care every 6 months. Brush your teeth twice a day and floss once a day. Good oral hygiene prevents tooth decay and gum disease.  The frequency of eye exams is based on your age, health, family medical history, use of contact lenses, and other factors. Follow your health care provider's recommendations for frequency of eye exams.  Eat a healthy diet. Foods like vegetables, fruits, whole grains, low-fat dairy products, and lean protein foods contain the nutrients you need without too many calories. Decrease your intake of foods high in solid fats, added sugars, and salt. Eat the right amount of calories for you. Get information about a proper diet from your health care provider, if necessary.  Regular physical exercise is one of the most important things you can do for your health. Most adults should get at least 150 minutes of moderate-intensity exercise (any activity that increases your heart rate and causes you to sweat) each  week. In addition, most adults need muscle-strengthening exercises on 2 or more days a week.  Maintain a healthy weight. The body mass index (BMI) is a screening tool to identify possible weight problems. It provides an estimate of body fat based on height and weight. Your health care provider can find your BMI and can help you achieve or maintain a healthy weight. For adults 20 years and older:  A BMI below 18.5 is considered underweight.  A BMI of 18.5 to 24.9 is normal.  A BMI of 25 to 29.9 is considered overweight.  A BMI of 30 and above is considered obese.  Maintain normal blood lipids and cholesterol levels by exercising and minimizing your intake of  saturated fat. Eat a balanced diet with plenty of fruit and vegetables. If your lipid or cholesterol levels are high, you are over 50, or you are at high risk for heart disease, you may need your cholesterol levels checked more frequently. Ongoing high lipid and cholesterol levels should be treated with medicines if diet and exercise are not working.  If you smoke, find out from your health care provider how to quit. If you do not use tobacco, do not start.  Lung cancer screening is recommended for adults aged 78-80 years who are at high risk for developing lung cancer because of a history of smoking. A yearly low-dose CT scan of the lungs is recommended for people who have at least a 30-pack-year history of smoking and are a current smoker or have quit within the past 15 years. A pack year of smoking is smoking an average of 1 pack of cigarettes a day for 1 year (for example: 1 pack a day for 30 years or 2 packs a day for 15 years). Yearly screening should continue until the smoker has stopped smoking for at least 15 years. Yearly screening should be stopped for people who develop a health problem that would prevent them from having lung cancer treatment.  Avoid use of street drugs. Do not share needles with anyone. Ask for help if you need  support or instructions about stopping the use of drugs.  High blood pressure causes heart disease and increases the risk of stroke.  Ongoing high blood pressure should be treated with medicines if weight loss and exercise do not work.  If you are 14-45 years old, ask your health care provider if you should take aspirin to prevent strokes.  Diabetes screening involves taking a blood sample to check your fasting blood sugar level. This should be done once every 3 years, after age 40, if you are within normal weight and without risk factors for diabetes. Testing should be considered at a younger age or be carried out more frequently if you are overweight and have at least 1 risk factor for diabetes.  Breast cancer screening is essential preventive care for women. You should practice "breast self-awareness." This means understanding the normal appearance and feel of your breasts and may include breast self-examination. Any changes detected, no matter how small, should be reported to a health care provider. Women in their 18s and 30s should have a clinical breast exam (CBE) by a health care provider as part of a regular health exam every 1 to 3 years. After age 71, women should have a CBE every year. Starting at age 39, women should consider having a mammogram (breast X-ray test) every year. Women who have a family history of breast cancer should talk to their health care provider about genetic screening. Women at a high risk of breast cancer should talk to their health care providers about having an MRI and a mammogram every year.  Breast cancer gene (BRCA)-related cancer risk assessment is recommended for women who have family members with BRCA-related cancers. BRCA-related cancers include breast, ovarian, tubal, and peritoneal cancers. Having family members with these cancers may be associated with an increased risk for harmful changes (mutations) in the breast cancer genes BRCA1 and BRCA2. Results of the  assessment will determine the need for genetic counseling and BRCA1 and BRCA2 testing.  Routine pelvic exams to screen for cancer are no longer recommended for nonpregnant women who are considered low risk for cancer of the pelvic organs (ovaries,  uterus, and vagina) and who do not have symptoms. Ask your health care provider if a screening pelvic exam is right for you.  If you have had past treatment for cervical cancer or a condition that could lead to cancer, you need Pap tests and screening for cancer for at least 20 years after your treatment. If Pap tests have been discontinued, your risk factors (such as having a new sexual partner) need to be reassessed to determine if screening should be resumed. Some women have medical problems that increase the chance of getting cervical cancer. In these cases, your health care provider may recommend more frequent screening and Pap tests.    Colorectal cancer can be detected and often prevented. Most routine colorectal cancer screening begins at the age of 62 years and continues through age 28 years. However, your health care provider may recommend screening at an earlier age if you have risk factors for colon cancer. On a yearly basis, your health care provider may provide home test kits to check for hidden blood in the stool. Use of a small camera at the end of a tube, to directly examine the colon (sigmoidoscopy or colonoscopy), can detect the earliest forms of colorectal cancer. Talk to your health care provider about this at age 23, when routine screening begins.  Direct exam of the colon should be repeated every 5-10 years through age 31 years, unless early forms of pre-cancerous polyps or small growths are found.  Osteoporosis is a disease in which the bones lose minerals and strength with aging. This can result in serious bone fractures or breaks. The risk of osteoporosis can be identified using a bone density scan. Women ages 83 years and over and women  at risk for fractures or osteoporosis should discuss screening with their health care providers. Ask your health care provider whether you should take a calcium supplement or vitamin D to reduce the rate of osteoporosis.  Menopause can be associated with physical symptoms and risks. Hormone replacement therapy is available to decrease symptoms and risks. You should talk to your health care provider about whether hormone replacement therapy is right for you.  Use sunscreen. Apply sunscreen liberally and repeatedly throughout the day. You should seek shade when your shadow is shorter than you. Protect yourself by wearing long sleeves, pants, a wide-brimmed hat, and sunglasses year round, whenever you are outdoors.  Once a month, do a whole body skin exam, using a mirror to look at the skin on your back. Tell your health care provider of new moles, moles that have irregular borders, moles that are larger than a pencil eraser, or moles that have changed in shape or color.  Stay current with required vaccines (immunizations).  Influenza vaccine. All adults should be immunized every year.  Tetanus, diphtheria, and acellular pertussis (Td, Tdap) vaccine. Pregnant women should receive 1 dose of Tdap vaccine during each pregnancy. The dose should be obtained regardless of the length of time since the last dose. Immunization is preferred during the 27th-36th week of gestation. An adult who has not previously received Tdap or who does not know her vaccine status should receive 1 dose of Tdap. This initial dose should be followed by tetanus and diphtheria toxoids (Td) booster doses every 10 years. Adults with an unknown or incomplete history of completing a 3-dose immunization series with Td-containing vaccines should begin or complete a primary immunization series including a Tdap dose. Adults should receive a Td booster every 10 years.  Zoster vaccine. One dose is recommended for adults aged 64 years or  older unless certain conditions are present.    Pneumococcal 13-valent conjugate (PCV13) vaccine. When indicated, a person who is uncertain of her immunization history and has no record of immunization should receive the PCV13 vaccine. An adult aged 61 years or older who has certain medical conditions and has not been previously immunized should receive 1 dose of PCV13 vaccine. This PCV13 should be followed with a dose of pneumococcal polysaccharide (PPSV23) vaccine. The PPSV23 vaccine dose should be obtained at least 1 or more year(s) after the dose of PCV13 vaccine. An adult aged 46 years or older who has certain medical conditions and previously received 1 or more doses of PPSV23 vaccine should receive 1 dose of PCV13. The PCV13 vaccine dose should be obtained 1 or more years after the last PPSV23 vaccine dose.    Pneumococcal polysaccharide (PPSV23) vaccine. When PCV13 is also indicated, PCV13 should be obtained first. All adults aged 58 years and older should be immunized. An adult younger than age 1 years who has certain medical conditions should be immunized. Any person who resides in a nursing home or long-term care facility should be immunized. An adult smoker should be immunized. People with an immunocompromised condition and certain other conditions should receive both PCV13 and PPSV23 vaccines. People with human immunodeficiency virus (HIV) infection should be immunized as soon as possible after diagnosis. Immunization during chemotherapy or radiation therapy should be avoided. Routine use of PPSV23 vaccine is not recommended for American Indians, [place] Natives, or people younger than 65 years unless there are medical conditions that require PPSV23 vaccine. When indicated, people who have unknown immunization and have no record of immunization should receive PPSV23 vaccine. One-time revaccination 5 years after the first dose of PPSV23 is recommended for people aged 19-64 years who have chronic  kidney failure, nephrotic syndrome, asplenia, or immunocompromised conditions. People who received 1-2 doses of PPSV23 before age 11 years should receive another dose of PPSV23 vaccine at age 70 years or later if at least 5 years have passed since the previous dose. Doses of PPSV23 are not needed for people immunized with PPSV23 at or after age 65 years.   Preventive Services / Frequency  Ages 56 years and over  Blood pressure check.  Lipid and cholesterol check.  Lung cancer screening. / Every year if you are aged 16-80 years and have a 30-pack-year history of smoking and currently smoke or have quit within the past 15 years. Yearly screening is stopped once you have quit smoking for at least 15 years or develop a health problem that would prevent you from having lung cancer treatment.  Clinical breast exam.** / Every year after age 59 years.   BRCA-related cancer risk assessment.** / For women who have family members with a BRCA-related cancer (breast, ovarian, tubal, or peritoneal cancers).  Mammogram.** / Every year beginning at age 70 years and continuing for as long as you are in good health. Consult with your health care provider.  Pap test.** / Every 3 years starting at age 32 years through age 28 or 9 years with 3 consecutive normal Pap tests. Testing can be stopped between 65 and 70 years with 3 consecutive normal Pap tests and no abnormal Pap or HPV tests in the past 10 years.  Fecal occult blood test (FOBT) of stool. / Every year beginning at age 57 years and continuing until age 29 years. You may not need to  do this test if you get a colonoscopy every 10 years.  Flexible sigmoidoscopy or colonoscopy.** / Every 5 years for a flexible sigmoidoscopy or every 10 years for a colonoscopy beginning at age 21 years and continuing until age 56 years.  Hepatitis C blood test.** / For all people born from [birth year] through 1966 and any individual with known risks for hepatitis  C.  Osteoporosis screening.** / A one-time screening for women ages 53 years and over and women at risk for fractures or osteoporosis.  Skin self-exam. / Monthly.  Influenza vaccine. / Every year.  Tetanus, diphtheria, and acellular pertussis (Tdap/Td) vaccine.** / 1 dose of Td every 10 years.  Zoster vaccine.** / 1 dose for adults aged 32 years or older.  Pneumococcal 13-valent conjugate (PCV13) vaccine.** / Consult your health care provider.  Pneumococcal polysaccharide (PPSV23) vaccine.** / 1 dose for all adults aged 67 years and older. Screening for abdominal aortic aneurysm (AAA)  by ultrasound is recommended for people who have history of high blood pressure or who are current or former smokers. ++++++++++++++++++++ Recommend Adult Low Dose Aspirin or  coated  Aspirin 81 mg daily  To reduce risk of Colon Cancer 40 %,  Skin Cancer 26 % ,  Melanoma 46%  and  Pancreatic cancer 60% ++++++++++++++++++++ Vitamin D goal  is between 70-100.  Please make sure that you are taking your Vitamin D as directed.  It is very important as a natural anti-inflammatory  helping hair, skin, and nails, as well as reducing stroke and heart attack risk.  It helps your bones and helps with mood. It also decreases numerous cancer risks so please take it as directed.  Low Vit D is associated with a 200-300% higher risk for CANCER  and 200-300% higher risk for HEART   ATTACK  &  STROKE.   .....................................Marland Kitchen It is also associated with higher death rate at younger ages,  autoimmune diseases like Rheumatoid arthritis, Lupus, Multiple Sclerosis.    Also many other serious conditions, like depression, Alzheimer's Dementia, infertility, muscle aches, fatigue, fibromyalgia - just to name a few. ++++++++++++++++++ Recommend the book "The END of DIETING" by Dr Excell Seltzer  & the book "The END of DIABETES " by Dr Excell Seltzer At [email or web address] - get book & Audio CD's    Being diabetic has  a  300% increased risk for heart attack, stroke, cancer, and alzheimer- type vascular dementia. It is very important that you work harder with diet by avoiding all foods that are white. Avoid white rice ([place] & wild rice is OK), white potatoes (sweetpotatoes in moderation is OK), White bread or wheat bread or anything made out of white flour like bagels, donuts, rolls, buns, biscuits, cakes, pastries, cookies, pizza crust, and pasta (made from white flour & egg whites) - vegetarian pasta or spinach or wheat pasta is OK. Multigrain breads like Arnold's or Pepperidge Farm, or multigrain sandwich thins or flatbreads.  Diet, exercise and weight loss can reverse and cure diabetes in the early stages.  Diet, exercise and weight loss is very important in the control and prevention of complications of diabetes which affects every system in your body, ie. Brain - dementia/stroke, eyes - glaucoma/blindness, heart - heart attack/heart failure, kidneys - dialysis, stomach - gastric paralysis, intestines - malabsorption, nerves - severe painful neuritis, circulation - gangrene & loss of a leg(s), and finally cancer and Alzheimers.    I recommend avoid fried & greasy foods,  sweets/candy, white rice ([place] or wild  rice or Quinoa is OK), white potatoes (sweet potatoes are OK) - anything made from white flour - bagels, doughnuts, rolls, buns, biscuits,white and wheat breads, pizza crust and traditional pasta made of white flour & egg white(vegetarian pasta or spinach or wheat pasta is OK).  Multi-grain bread is OK - like multi-grain flat bread or sandwich thins. Avoid alcohol in excess. Exercise is also important.    Eat all the vegetables you want - avoid meat, especially red meat and dairy - especially cheese.  Cheese is the most concentrated form of trans-fats which is the worst thing to clog up our arteries. Veggie cheese is OK which can be found in the fresh produce section at [employer] or [employer] or  [employer]  +++++++++++++++++++ DASH Eating Plan  DASH stands for "Dietary Approaches to Stop Hypertension."   The DASH eating plan is a healthy eating plan that has been shown to reduce high blood pressure (hypertension). Additional health benefits may include reducing the risk of type 2 diabetes mellitus, heart disease, and stroke. The DASH eating plan may also help with weight loss. WHAT DO I NEED TO KNOW ABOUT THE DASH EATING PLAN? For the DASH eating plan, you will follow these general guidelines:  Choose foods with a percent daily value for sodium of less than 5% (as listed on the food label).  Use salt-free seasonings or herbs instead of table salt or sea salt.  Check with your health care provider or pharmacist before using salt substitutes.  Eat lower-sodium products, often labeled as "lower sodium" or "no salt added."  Eat fresh foods.  Eat more vegetables, fruits, and low-fat dairy products.  Choose whole grains. Look for the word "whole" as the first word in the ingredient list.  Choose fish   Limit sweets, desserts, sugars, and sugary drinks.  Choose heart-healthy fats.  Eat veggie cheese   Eat more home-cooked food and less restaurant, buffet, and fast food.  Limit fried foods.  Cook foods using methods other than frying.  Limit canned vegetables. If you do use them, rinse them well to decrease the sodium.  When eating at a restaurant, ask that your food be prepared with less salt, or no salt if possible.                      WHAT FOODS CAN I EAT? Read Dr [name] Fuhrman's books on The End of Dieting & The End of Diabetes  Grains Whole grain or whole wheat bread. [place] rice. Whole grain or whole wheat pasta. Quinoa, bulgur, and whole grain cereals. Low-sodium cereals. Corn or whole wheat flour tortillas. Whole grain cornbread. Whole grain crackers. Low-sodium crackers.  Vegetables Fresh or frozen vegetables (raw, steamed, roasted, or grilled). Low-sodium or  reduced-sodium tomato and vegetable juices. Low-sodium or reduced-sodium tomato sauce and paste. Low-sodium or reduced-sodium canned vegetables.   Fruits All fresh, canned (in natural juice), or frozen fruits.  Protein Products  All fish and seafood.  Dried beans, peas, or lentils. Unsalted nuts and seeds. Unsalted canned beans.  Dairy Low-fat dairy products, such as skim or 1% milk, 2% or reduced-fat cheeses, low-fat ricotta or cottage cheese, or plain low-fat yogurt. Low-sodium or reduced-sodium cheeses.  Fats and Oils Tub margarines without trans fats. Light or reduced-fat mayonnaise and salad dressings (reduced sodium). Avocado. Safflower, olive, or canola oils. Natural peanut or almond butter.  Other Unsalted popcorn and pretzels. The items listed above may not be a complete list of recommended foods  or beverages. Contact your dietitian for more options.  +++++++++++++++  WHAT FOODS ARE NOT RECOMMENDED? Grains/ White flour or wheat flour White bread. White pasta. White rice. Refined cornbread. Bagels and croissants. Crackers that contain trans fat.  Vegetables  Creamed or fried vegetables. Vegetables in a . Regular canned vegetables. Regular canned tomato sauce and paste. Regular tomato and vegetable juices.  Fruits Dried fruits. Canned fruit in light or heavy syrup. Fruit juice.  Meat and Other Protein Products Meat in general - RED meat & White meat.  Fatty cuts of meat. Ribs, chicken wings, all processed meats as bacon, sausage, bologna, salami, fatback, hot dogs, bratwurst and packaged luncheon meats.  Dairy Whole or 2% milk, cream, half-and-half, and cream cheese. Whole-fat or sweetened yogurt. Full-fat cheeses or blue cheese. Non-dairy creamers and whipped toppings. Processed cheese, cheese spreads, or cheese curds.  Condiments Onion and garlic salt, seasoned salt, table salt, and sea salt. Canned and packaged gravies. Worcestershire sauce. Tartar sauce. Barbecue  sauce. Teriyaki sauce. Soy sauce, including reduced sodium. Steak sauce. Fish sauce. Oyster sauce. Cocktail sauce. Horseradish. Ketchup and mustard. Meat flavorings and tenderizers. Bouillon cubes. Hot sauce. Tabasco sauce. Marinades. Taco seasonings. Relishes.  Fats and Oils Butter, stick margarine, lard, shortening and bacon fat. Coconut, palm kernel, or palm oils. Regular salad dressings.  Pickles and olives. Salted popcorn and pretzels.  The items listed above may not be a complete list of foods and beverages to avoid.

## 2020-01-15 NOTE — Progress Notes (Signed)
Annual Screening/Preventative Visit & Comprehensive Evaluation &  Examination     This very nice 79 y.o. WWF presents for a Screening /Preventative Visit & comprehensive evaluation and management of multiple medical co-morbidities.  Patient has been followed for HTN, HLD, Prediabetes  and Vitamin D Deficiency. Patient has GERD controlled on her meds.      HTN predates since 1999. Patient's BP has been controlled at home. Patient had a Negative Cardiolite in 2011. A  Patient denies any cardiac symptoms as chest pain, palpitations, shortness of breath, dizziness or ankle swelling. Today's BP s at goal - 116/76.      Patient's hyperlipidemia is controlled with diet and Atorvastatin. Patient denies myalgias or other medication SE's. Last lipids were at goal:  Lab Results  Component Value Date   CHOL 123 06/27/2019   HDL 49 (L) 06/27/2019   LDLCALC 53 06/27/2019   TRIG 125 06/27/2019   CHOLHDL 2.5 06/27/2019       Patient has hx/o prediabetes(A1c 5.9% / I 90 / 2011) and patient denies reactive hypoglycemic symptoms, visual blurring, diabetic polys or paresthesias. Last A1c was not at goal:  Lab Results  Component Value Date   HGBA1C 6.1 (H) 06/27/2019   Wt Readings from Last 3 Encounters:  01/16/20 156 lb 12.8 oz (71.1 kg)  01/07/20 160 lb (72.6 kg)  07/31/19 168 lb 12.8 oz (76.6 kg)  Weight down 12# last 6 months.     Finally, patient has history of Vitamin D Deficiency ("31" / 2008)  and last Vitamin D was at goal: Lab Results  Component Value Date   VD25OH 53 (H) 06/27/2019    Current Outpatient Medications on File Prior to Visit  Medication Sig  . amLODipine (NORVASC) 2.5 MG tablet Take 1 tablet Daily for BP  . aspirin EC 81 MG tablet Take 81 mg by mouth daily.  Marland Kitchen atorvastatin (LIPITOR) 80 MG tablet Take 1 tablet Daily for Cholesterol  . bisoprolol (ZEBETA) 10 MG tablet Take 1/2 to 1 tablet every Morning for BP  . CHOLECALCIFEROL PO Take 5,000 Units by mouth in the  morning, at noon, and at bedtime.   . gabapentin (NEURONTIN) 100 MG capsule Take 1 to 3 capsules Daily as needed for Neuropathy Pains  . meclizine (ANTIVERT) 25 MG tablet Take 1/2 to 1 tablet 2 to 3 x /day as needed for Dizziness / Vertigo  . meloxicam (MOBIC) 15 MG tablet Take 1/2 to 1 tablet daily with  Food for pain  & Inflammation- try to limit to max 5 days   /week (Patient taking differently: as needed. Take 1/2 to 1 tablet daily with  Food for pain  & Inflammation- try to limit to max 5 days   /week)  . olmesartan (BENICAR) 40 MG tablet Take 1 tablet Daily for BP  . pantoprazole (PROTONIX) 40 MG tablet Take 1 tablet Daily for Indigestion & Acid Reflux  . PARoxetine (PAXIL) 30 MG tablet Take 1 tablet Daily for Mood   No current facility-administered medications on file prior to visit.   Allergies  Allergen Reactions  . Aspirin Other (See Comments)    High Doses stomach trouble  . Codeine Nausea And Vomiting  . Fenofibrate Cough  . Hydrochlorothiazide Other (See Comments)    Fatigue  . Metoclopramide Other (See Comments)    Sedation   Past Medical History:  Diagnosis Date  . Arthritis   . Colon cancer ([place]) 1990s, 2014  . Colon polyps   . Depression   .  Dysrhythmia 04/2012   SVT - RATE 145 POST OP CRANIOTOMY SURGERY --TX'D AND RESOLVED--PT STATES NO FURTHER PROBLEM THAT SHE IS AWARE OF -SHE HAS NOT HAD TO SEE CARDIOLOGIST  . GERD (gastroesophageal reflux disease)   . Headache(784.0)    hx migraines  . Hypertension    pt states she has not taken any b/p medication since her brain surgery in oct 3013--took herself off the medication  . Pituitary mass ([place]) NOV 2013   MASS FOUND AFTER PT EXPERIENCED BILATERAL BLURRED / HAZY VISION -ESP LEFT EYE.  S/P CRANIOTOMY AND EYESIGHT RETURNED TO NORMAL--PT STILL HAS A LOT OF TENDERNESS RIGHT SIDE OF NOSE --THE CRANIOTOMY WAS DONE BY TRANSNASAL APPROACH  . Pneumonia yrs ago   hx of pneumonia  . PONV (postoperative nausea and vomiting)      PT STATES PLEASE NOTE SHE HAS TENDERNESS RIGHT SIDE OF NOSE - SINCE TRANSNASAL CRANIOTOMY 04/2012  . Prediabetes   . Vitamin D deficiency    Health Maintenance  Topic Date Due  . Hepatitis C Screening  Never done  . COVID-19 Vaccine (1) Never done  . INFLUENZA VACCINE  01/05/2020  . DEXA SCAN  02/04/2020 (Originally 06/04/2005)  . TETANUS/TDAP  11/30/2025  . PNA vac Low Risk Adult  Completed   Immunization History  Administered Date(s) Administered  . Influenza Split 04/19/2012  . Influenza, High Dose Seasonal PF 04/17/2013, 03/12/2014, 03/03/2017, 04/12/2018, 04/17/2019  . Pneumococcal Conjugate-13 09/30/2014  . Pneumococcal Polysaccharide-23 04/19/2012  . Td 07/20/2005, 12/01/2015    Last Colon - 07/03/2018 - Dr Paulita Fujita - recc no F/U due to age   Last MGM - 10/2013 - patient was encouraged to schedule MGM  Past Surgical History:  Procedure Laterality Date  . BACK SURGERY     lower  . CATARACT EXTRACTION  June/july 2013   bilateral eyes  . COLON RESECTION  1988  . COLON SURGERY  1990's  . COLONOSCOPY  09/20/2011   Procedure: COLONOSCOPY;  Surgeon: Missy Sabins, MD;  Location: [facility];  Service: Endoscopy;  Laterality: N/A;  . COLONOSCOPY  12/15/2011   Procedure: COLONOSCOPY;  Surgeon: Missy Sabins, MD;  Location: [facility];  Service: Endoscopy;  Laterality: N/A;  . COLONOSCOPY  01/26/2012   Procedure: COLONOSCOPY;  Surgeon: Cleotis Nipper, MD;  Location: [facility];  Service: Endoscopy;  Laterality: N/A;  . CRANIOTOMY  04/18/2012   Procedure: CRANIOTOMY HYPOPHYSECTOMY TRANSNASAL APPROACH;  Surgeon: Ophelia Charter, MD;  Location: [facility];  Service: Neurosurgery;  Laterality: Bilateral;  Transphenoidal resection of Pituitary tumor.   . CYSTOSCOPY WITH URETEROSCOPY AND STENT PLACEMENT Bilateral 10/01/2015   Procedure: CYSTOSCOPY, retrograde WITH URETEROSCOPY AND STENT PLACEMENT, ;  Surgeon: Raynelle Bring, MD;  Location: [facility];  Service: Urology;  Laterality:  Bilateral;  . CYSTOSCOPY WITH URETEROSCOPY AND STENT PLACEMENT Right 10/19/2015   Procedure: CYSTOSCOPY,  RIGHT URETEROSCOPY, HOLMIUM LASER RIGHT URETERAL STONE, RIGHT URETERAL STENT PLACEMENT, LEFT URETEROSCOPY, INSERTION LEFT URETERAL STENT;  Surgeon: Raynelle Bring, MD;  Location: [facility];  Service: Urology;  Laterality: Right;  . EYE SURGERY     cataract bil  . HOLMIUM LASER APPLICATION Right 5/85/2778   Procedure: HOLMIUM LASER  ;  Surgeon: Raynelle Bring, MD;  Location: [facility];  Service: Urology;  Laterality: Right;  . HOT HEMOSTASIS  09/20/2011   Procedure: HOT HEMOSTASIS (ARGON PLASMA COAGULATION/BICAP);  Surgeon: Missy Sabins, MD;  Location: [facility];  Service: Endoscopy;  Laterality: N/A;  . LAPAROSCOPIC RIGHT HEMI COLECTOMY  06/27/2012  Procedure: LAPAROSCOPIC RIGHT HEMI COLECTOMY;  Surgeon: Pedro Earls, MD;  Location: [facility];  Service: General;  Laterality: N/A;  Laparoscopic Assited Right Hemicolectomy  . TRANSNASAL APPROACH  04/18/2012   Procedure: TRANSNASAL APPROACH;  Surgeon: Rozetta Nunnery, MD;  Location: [facility];  Service: ENT;  Laterality: Bilateral;  Transnasal approach  . TUMOR REMOVAL     Left leg   Family History  Problem Relation Age of Onset  . Colon cancer Sister 53  . Heart disease Mother   . Heart disease Father   . Cancer Maternal Aunt        unknown form of cancer dx in her 71s-50s  . Liver cancer Maternal Uncle        heavy ETOH user  . Breast cancer Sister 33  . Dementia Sister   . Colon cancer Cousin   . Colon polyps Son   . Colon polyps Son   . Colon cancer Other 30  . Colon polyps Other   . Brain cancer Cousin        dx in his 26s; paternal cousin   Social History   Tobacco Use  . Smoking status: Never Smoker  . Smokeless tobacco: Never Used  Vaping Use  . Vaping Use: Never used  Substance Use Topics  . Alcohol use: No    Comment: a couple times a year, one drink at a time  . Drug use: No    ROS Constitutional: Denies  fever, chills, weight loss/gain, headaches, insomnia,  night sweats, and change in appetite. Does c/o fatigue. Eyes: Denies redness, blurred vision, diplopia, discharge, itchy, watery eyes.  ENT: Denies discharge, congestion, post nasal drip, epistaxis, sore throat, earache, hearing loss, dental pain, Tinnitus, Vertigo, Sinus pain, snoring.  Cardio: Denies chest pain, palpitations, irregular heartbeat, syncope, dyspnea, diaphoresis, orthopnea, PND, claudication, edema Respiratory: denies cough, dyspnea, DOE, pleurisy, hoarseness, laryngitis, wheezing.  Gastrointestinal: Denies dysphagia, heartburn, reflux, water brash, pain, cramps, nausea, vomiting, bloating, diarrhea, constipation, hematemesis, melena, hematochezia, jaundice, hemorrhoids Genitourinary: Denies dysuria, frequency, urgency, nocturia, hesitancy, discharge, hematuria, flank pain Breast: Breast lumps, nipple discharge, bleeding.  Musculoskeletal: Denies arthralgia, myalgia, stiffness, Jt. Swelling, pain, limp, and strain/sprain. Denies falls. Skin: Denies puritis, rash, hives, warts, acne, eczema, changing in skin lesion Neuro: No weakness, tremor, incoordination, spasms, paresthesia, pain Psychiatric: Denies confusion, memory loss, sensory loss. Denies Depression. Endocrine: Denies change in weight, skin, hair change, nocturia, and paresthesia, diabetic polys, visual blurring, hyper / hypo glycemic episodes.  Heme/Lymph: No excessive bleeding, bruising, enlarged lymph nodes.  Physical Exam  BP 116/76   Pulse (!) 48   Temp (!) 97.1 F (36.2 C)   Resp 16   Ht 5\' 5"  (1.651 m)   Wt 156 lb 12.8 oz (71.1 kg)   BMI 26.09 kg/m   General Appearance: Well nourished, well groomed and in no apparent distress.  Eyes: PERRLA, EOMs, conjunctiva no swelling or erythema, normal fundi and vessels. Sinuses: No frontal/maxillary tenderness ENT/Mouth: EACs patent / TMs  nl. Nares clear without erythema, swelling, mucoid exudates. Oral hygiene  is good. No erythema, swelling, or exudate. Tongue normal, non-obstructing. Tonsils not swollen or erythematous. Hearing normal.  Neck: Supple, thyroid not palpable. No bruits, nodes or JVD. Respiratory: Respiratory effort normal.  BS equal and clear bilateral without rales, rhonci, wheezing or stridor. Cardio: Heart sounds are normal with regular rate and rhythm and no murmurs, rubs or gallops. Peripheral pulses are normal and equal bilaterally without edema. No aortic or femoral bruits. Chest:  symmetric with normal excursions and percussion. Breasts: Symmetric, without lumps, nipple discharge, retractions, or fibrocystic changes.  Abdomen: Flat, soft with bowel sounds active. Nontender, no guarding, rebound, hernias, masses, or organomegaly.  Lymphatics: Non tender without lymphadenopathy.  Musculoskeletal: Full ROM all peripheral extremities, joint stability, 5/5 strength, and normal gait. Skin: Warm and dry without rashes, lesions, cyanosis, clubbing or  ecchymosis.  Neuro: Cranial nerves intact, reflexes equal bilaterally. Normal muscle tone, no cerebellar symptoms. Sensation intact.  Pysch: Alert and oriented X 3, normal affect, Insight and Judgment appropriate.   Assessment and Plan  1. Annual Preventative Screening Examination  2. Essential hypertension  - EKG 12-Lead - Urinalysis, Routine w reflex microscopic - Microalbumin / creatinine urine ratio - CBC with Differential/Platelet - COMPLETE METABOLIC PANEL WITH GFR - Magnesium - TSH  3. Hyperlipidemia, mixed  - EKG 12-Lead - Lipid panel - TSH  4. Abnormal glucose  - EKG 12-Lead - Hemoglobin A1c - Insulin, random  5. Vitamin D deficiency  - VITAMIN D 25 Hydroxy  6. Prediabetes  - EKG 12-Lead - Hemoglobin A1c - Insulin, random  7. Gastroesophageal reflux disease  - CBC with Differential/Platelet  8. Screening for colorectal cancer  - POC Hemoccult Bld/Stl   9. Screening for ischemic heart disease  -  EKG 12-Lead  10. FHx: heart disease  - EKG 12-Lead  11. Medication management  - Urinalysis, Routine w reflex microscopic - Microalbumin / creatinine urine ratio - COMPLETE METABOLIC PANEL WITH GFR - Magnesium - Lipid panel - TSH - Hemoglobin A1c - Insulin, random - VITAMIN D 25 Hydroxy        Patient was counseled in prudent diet to achieve/maintain BMI less than 25 for weight control, BP monitoring, regular exercise and medications. Discussed med's effects and SE's. Screening labs and tests as requested with regular follow-up as recommended. Over 40 minutes of exam, counseling, chart review and high complex critical decision making was performed.   Kirtland Bouchard, MD

## 2020-01-16 ENCOUNTER — Ambulatory Visit (INDEPENDENT_AMBULATORY_CARE_PROVIDER_SITE_OTHER): Payer: PPO | Admitting: Internal Medicine

## 2020-01-16 ENCOUNTER — Other Ambulatory Visit: Payer: Self-pay

## 2020-01-16 VITALS — BP 116/76 | HR 48 | Temp 97.1°F | Resp 16 | Ht 65.0 in | Wt 156.8 lb

## 2020-01-16 DIAGNOSIS — Z136 Encounter for screening for cardiovascular disorders: Secondary | ICD-10-CM | POA: Diagnosis not present

## 2020-01-16 DIAGNOSIS — K219 Gastro-esophageal reflux disease without esophagitis: Secondary | ICD-10-CM | POA: Diagnosis not present

## 2020-01-16 DIAGNOSIS — Z0000 Encounter for general adult medical examination without abnormal findings: Secondary | ICD-10-CM | POA: Diagnosis not present

## 2020-01-16 DIAGNOSIS — Z79899 Other long term (current) drug therapy: Secondary | ICD-10-CM | POA: Diagnosis not present

## 2020-01-16 DIAGNOSIS — R7303 Prediabetes: Secondary | ICD-10-CM

## 2020-01-16 DIAGNOSIS — Z0001 Encounter for general adult medical examination with abnormal findings: Secondary | ICD-10-CM

## 2020-01-16 DIAGNOSIS — E782 Mixed hyperlipidemia: Secondary | ICD-10-CM

## 2020-01-16 DIAGNOSIS — R7309 Other abnormal glucose: Secondary | ICD-10-CM

## 2020-01-16 DIAGNOSIS — E559 Vitamin D deficiency, unspecified: Secondary | ICD-10-CM

## 2020-01-16 DIAGNOSIS — Z8249 Family history of ischemic heart disease and other diseases of the circulatory system: Secondary | ICD-10-CM

## 2020-01-16 DIAGNOSIS — I10 Essential (primary) hypertension: Secondary | ICD-10-CM

## 2020-01-16 DIAGNOSIS — Z1211 Encounter for screening for malignant neoplasm of colon: Secondary | ICD-10-CM

## 2020-01-16 MED ORDER — DEXAMETHASONE 4 MG PO TABS
ORAL_TABLET | ORAL | 0 refills | Status: DC
Start: 2020-01-16 — End: 2020-04-20

## 2020-01-17 LAB — URINALYSIS, ROUTINE W REFLEX MICROSCOPIC
Bacteria, UA: NONE SEEN /HPF
Bilirubin Urine: NEGATIVE
Glucose, UA: NEGATIVE
Hgb urine dipstick: NEGATIVE
Hyaline Cast: NONE SEEN /LPF
Leukocytes,Ua: NEGATIVE
Nitrite: NEGATIVE
Specific Gravity, Urine: 1.024 (ref 1.001–1.03)
Squamous Epithelial / HPF: NONE SEEN /HPF (ref <=5)
pH: 5.5 (ref 5.0–8.0)

## 2020-01-17 LAB — HEMOGLOBIN A1C
Hgb A1c MFr Bld: 5.9 % of total Hgb — ABNORMAL HIGH (ref <5.7)
Mean Plasma Glucose: 123 (calc)
eAG (mmol/L): 6.8 (calc)

## 2020-01-17 LAB — MICROALBUMIN / CREATININE URINE RATIO
Creatinine, Urine: 287 mg/dL — ABNORMAL HIGH (ref 20–275)
Microalb Creat Ratio: 9 mcg/mg creat (ref <30)
Microalb, Ur: 2.6 mg/dL

## 2020-01-17 LAB — CBC WITH DIFFERENTIAL/PLATELET
Absolute Monocytes: 385 {cells}/uL (ref 200–950)
Basophils Absolute: 50 {cells}/uL (ref 0–200)
Basophils Relative: 0.9 %
Eosinophils Absolute: 143 {cells}/uL (ref 15–500)
Eosinophils Relative: 2.6 %
HCT: 38.5 % (ref 35.0–45.0)
Hemoglobin: 12.4 g/dL (ref 11.7–15.5)
Lymphs Abs: 1078 {cells}/uL (ref 850–3900)
MCH: 28.6 pg (ref 27.0–33.0)
MCHC: 32.2 g/dL (ref 32.0–36.0)
MCV: 88.9 fL (ref 80.0–100.0)
MPV: 10.4 fL (ref 7.5–12.5)
Monocytes Relative: 7 %
Neutro Abs: 3845 {cells}/uL (ref 1500–7800)
Neutrophils Relative %: 69.9 %
Platelets: 328 Thousand/uL (ref 140–400)
RBC: 4.33 Million/uL (ref 3.80–5.10)
RDW: 13.1 % (ref 11.0–15.0)
Total Lymphocyte: 19.6 %
WBC: 5.5 Thousand/uL (ref 3.8–10.8)

## 2020-01-17 LAB — LIPID PANEL
Cholesterol: 185 mg/dL (ref <200)
HDL: 42 mg/dL — ABNORMAL LOW (ref >=50)
LDL Cholesterol (Calc): 121 mg/dL (calc) — ABNORMAL HIGH
Non-HDL Cholesterol (Calc): 143 mg/dL (calc) — ABNORMAL HIGH (ref <130)
Total CHOL/HDL Ratio: 4.4 (calc) (ref <5.0)
Triglycerides: 117 mg/dL (ref <150)

## 2020-01-17 LAB — MAGNESIUM: Magnesium: 2.1 mg/dL (ref 1.5–2.5)

## 2020-01-17 LAB — COMPLETE METABOLIC PANEL WITH GFR
AG Ratio: 1.8 (calc) (ref 1.0–2.5)
ALT: 10 U/L (ref 6–29)
AST: 12 U/L (ref 10–35)
Albumin: 4.1 g/dL (ref 3.6–5.1)
Alkaline phosphatase (APISO): 76 U/L (ref 37–153)
BUN: 14 mg/dL (ref 7–25)
CO2: 29 mmol/L (ref 20–32)
Calcium: 9.4 mg/dL (ref 8.6–10.4)
Chloride: 106 mmol/L (ref 98–110)
Creat: 0.75 mg/dL (ref 0.60–0.93)
GFR, Est African American: 88 mL/min/{1.73_m2} (ref >=60)
GFR, Est Non African American: 76 mL/min/{1.73_m2} (ref >=60)
Globulin: 2.3 g/dL (calc) (ref 1.9–3.7)
Glucose, Bld: 101 mg/dL — ABNORMAL HIGH (ref 65–99)
Potassium: 4.8 mmol/L (ref 3.5–5.3)
Sodium: 141 mmol/L (ref 135–146)
Total Bilirubin: 0.4 mg/dL (ref 0.2–1.2)
Total Protein: 6.4 g/dL (ref 6.1–8.1)

## 2020-01-17 LAB — INSULIN, RANDOM: Insulin: 6.3 u[IU]/mL

## 2020-01-17 LAB — VITAMIN D 25 HYDROXY (VIT D DEFICIENCY, FRACTURES): Vit D, 25-Hydroxy: 62 ng/mL (ref 30–100)

## 2020-01-17 LAB — TSH: TSH: 1.75 m[IU]/L (ref 0.40–4.50)

## 2020-01-29 ENCOUNTER — Telehealth: Payer: Self-pay

## 2020-01-29 NOTE — Telephone Encounter (Signed)
Patient states that she is having a reaction to Decadron that was given for a spider bite. Having SOB, Excessive sleeping, no taste, can't get up, very fatigued. She stopped the medication 2 days ago.

## 2020-01-29 NOTE — Telephone Encounter (Signed)
Dr. Melford Aase aware and states that the Decadron should not be causing those symptoms. If patient is having SOB, needs to be seen in the ED.   Spoke with grandson and advise him on what Dr. Melford Aase said.

## 2020-02-07 ENCOUNTER — Other Ambulatory Visit: Payer: Self-pay | Admitting: Internal Medicine

## 2020-02-07 DIAGNOSIS — I10 Essential (primary) hypertension: Secondary | ICD-10-CM

## 2020-02-07 DIAGNOSIS — F325 Major depressive disorder, single episode, in full remission: Secondary | ICD-10-CM

## 2020-02-07 MED ORDER — BISOPROLOL FUMARATE 10 MG PO TABS: ORAL_TABLET | ORAL | 0 refills | Status: DC

## 2020-02-07 MED ORDER — PAROXETINE HCL 30 MG PO TABS: ORAL_TABLET | ORAL | 0 refills | Status: DC

## 2020-04-16 NOTE — Progress Notes (Signed)
MEDICARE ANNUAL WELLNESS VISIT AND FOLLOW UP Assessment:    Encounter for Medicare annual wellness exam Patient agrees to schedule mammogram for fall, dexa  SVT (supraventricular tachycardia) ([place]) No known recurrence, continue BB; monitor   Hypertension Will reduce BB due to bradycardia; start ziac 2.5/6.25 mg, monitor BP and pulse Monitor blood pressure at home; call if consistently over 130/80 Continue DASH diet.   Reminder to go to the ER if any CP, SOB, nausea, dizziness, severe HA, changes vision/speech, left arm numbness and tingling and jaw pain.  Gastroesophageal reflux disease, esophagitis presence not specified Well managed on current medications Discussed diet, avoiding triggers and other lifestyle changes  Pituitary tumor (excised 2013) S/p excision, doing well   Vitamin D deficiency Continue supplementation Defer vitamin D level to CPE  Prediabetes Discussed disease and risks Discussed diet/exercise, weight management  A1C q56m; monitor serum glucose, weight   BMI 26  Long discussion about weight, diet, and exercise Recommended diet heavy in fruits and veggies and low in animal meats, cheeses, and dairy products, appropriate calorie intake Discussed appropriate weight for height  Follow up at next visit  Medication management CBC, CMP/GFR, magnesium  Major depression in remission ([place]) Continue medications  Lifestyle discussed: diet/exerise, sleep hygiene, stress management, hydration  Hyperlipidemia Continue medications Continue low cholesterol diet and exercise.  Check lipid panel.   History of colon cancer, stage II Colonoscopy 06/2018; has been released  Bradycardia Sinus bradycardia per EKG 01/2020, taking bisoprolol 5 mg, will reduce to 2.5 mg and monitor, may need to taper off -  Monitor for fatigue, dizziness  Need for influenza vaccine High dose quadrivalent administered without complication today    Over 30 minutes of exam, counseling,  chart review, and critical decision making was performed  Future Appointments  Date Time Provider [place]  07/27/2020 10:30 AM Unk Pinto, MD [facility] None  01/22/2021 10:00 AM Unk Pinto, MD [facility] None  04/20/2021 11:00 AM Liane Comber, NP [facility] None     Plan:   During the course of the visit the patient was educated and counseled about appropriate screening and preventive services including:    Pneumococcal vaccine   Influenza vaccine  Prevnar 13  Td vaccine  Screening electrocardiogram  Colorectal cancer screening  Diabetes screening  Glaucoma screening  Nutrition counseling    Subjective:  Kristen [name] is a 79 y.o. female who presents for Medicare Annual Wellness Visit and 3 month follow up for HTN, hyperlipidemia, prediabetes, and vitamin D Def.   In 2013, patient underwent resection of a pituitary tumor and has done well since. She follows up with ? Neurology, reports last 07/2018, will request reports.   She doesn't have car or license, her friend/neighbor is available to drive when needed.   She has a history of colon cancer over 10 years ago, resolved with resection, had recent colonoscopy in 06/2018. Has been advised no further colonoscopies.   BMI is Body mass index is 25.63 kg/m., she has been working on diet, mainly practices moderation, eating out of her neighbor's vegetable garden during the summer. Generally very active, lots of hours in yard.  Wt Readings from Last 3 Encounters:  04/20/20 154 lb (69.9 kg)  01/16/20 156 lb 12.8 oz (71.1 kg)  01/07/20 160 lb (72.6 kg)   She has hx of SVT in 2013, has been on bisoprolol 10 mg daily without suspected reoccurrence  Her blood pressure has been controlled at home, today their BP is BP: 132/70  Pulse  has been low, 40s in office, hasn't been checking at home, taking bisoprolol 5 mg with olmesartan 40 mg daily  She does not workout. She denies chest pain, shortness of  breath, she does endorse some episodes of dizziness with sudden positions.   Atorvastatin 80 mg daily She is on cholesterol medication and denies myalgias. Her cholesterol is not at goal. The cholesterol last visit was:   Lab Results  Component Value Date   CHOL 185 01/16/2020   HDL 42 (L) 01/16/2020   LDLCALC 121 (H) 01/16/2020   TRIG 117 01/16/2020   CHOLHDL 4.4 01/16/2020   Historically Kristen [name] has had well controlled prediabetes secondary to diet and exercise Lab Results  Component Value Date   HGBA1C 5.9 (H) 01/16/2020   Last GFR Lab Results  Component Value Date   GFRNONAA 76 01/16/2020   Patient is on Vitamin D supplement.   Lab Results  Component Value Date   VD25OH 62 01/16/2020      Medication Review: Current Outpatient Medications on File Prior to Visit  Medication Sig Dispense Refill  . amLODipine (NORVASC) 2.5 MG tablet Take 1 tablet Daily for BP 90 tablet 0  . aspirin EC 81 MG tablet Take 81 mg by mouth daily.    Marland Kitchen atorvastatin (LIPITOR) 80 MG tablet Take 1 tablet Daily for Cholesterol 90 tablet 1  . bisoprolol (ZEBETA) 10 MG tablet Take 1 tablet     Daily      for BP (Patient taking differently: Takes 1/2 tablet daily) 90 tablet 0  . CHOLECALCIFEROL PO Take 5,000 Units by mouth in the morning, at noon, and at bedtime.     . gabapentin (NEURONTIN) 100 MG capsule Take 1 to 3 capsules Daily as needed for Neuropathy Pains 270 capsule 0  . meclizine (ANTIVERT) 25 MG tablet Take 1/2 to 1 tablet 2 to 3 x /day as needed for Dizziness / Vertigo 90 tablet 0  . meloxicam (MOBIC) 15 MG tablet Take 1/2 to 1 tablet daily with  Food for pain  & Inflammation- try to limit to max 5 days   /week (Patient taking differently: as needed. Take 1/2 to 1 tablet daily with  Food for pain  & Inflammation- try to limit to max 5 days   /week) 90 tablet 0  . olmesartan (BENICAR) 40 MG tablet Take 1 tablet Daily for BP 90 tablet 3  . pantoprazole (PROTONIX) 40 MG tablet Take 1 tablet  Daily for Indigestion & Acid Reflux 90 tablet 0  . PARoxetine (PAXIL) 30 MG tablet Take 1 tablet     Daily     for Mood 90 tablet 0   No current facility-administered medications on file prior to visit.    Allergies: Allergies  Allergen Reactions  . Aspirin Other (See Comments)    High Doses stomach trouble  . Codeine Nausea And Vomiting  . Fenofibrate Cough  . Hydrochlorothiazide Other (See Comments)    Fatigue  . Metoclopramide Other (See Comments)    Sedation    Current Problems (verified) has SVT (supraventricular tachycardia) ([place]); GERD (gastroesophageal reflux disease); Major depression in remission ([place]); Overweight (BMI 25.0-29.9); History of colon cancer, stage II; Vitamin D deficiency; Prediabetes; Hyperlipidemia, mixed; Medication management; Pituitary tumor (excised 2013); Essential hypertension; Abnormal glucose; and Bradycardia on their problem list.  Screening Tests Immunization History  Administered Date(s) Administered  . Influenza Split 04/19/2012  . Influenza, High Dose Seasonal PF 04/17/2013, 03/12/2014, 03/03/2017, 04/12/2018, 04/17/2019  . Pneumococcal Conjugate-13 09/30/2014  .  Pneumococcal Polysaccharide-23 04/19/2012  . Td 07/20/2005, 12/01/2015    Preventative care: DEXA: agreeable to schedule  Mammogram:  2015 - discussed and given breast center number to schedule  Last colonoscopy: 2015 - 06/2018 - DONE  Prior vaccinations: TD or Tdap: 2017  Influenza: 2020, TODAY  Pneumococcal: 2013 Prevnar13: 2016  Covid 19: declines, just had covid 19 in Oct  Names of Other Physician/Practitioners you currently use: 1. [facility] here for primary care 2. Dr. Gershon Crane, eye doctor, last visit 2019, needs to schedule follow up  3. Dr. Seward Grater, dentist, last visit 2021, have partial   Patient Care Team: Unk Pinto, MD as PCP - General (Internal Medicine) Zadie Rhine Clent Demark, MD as Consulting Physician  (Ophthalmology)  Surgical: She  has a past surgical history that includes Colon resection (1988); Colonoscopy (09/20/2011); Hot hemostasis (09/20/2011); Cataract extraction (June/july 2013); Colonoscopy (12/15/2011); Colonoscopy (01/26/2012); Eye surgery; Tumor removal; Craniotomy (04/18/2012); Transnasal approach (04/18/2012); Laparoscopic right hemi colectomy (06/27/2012); Cystoscopy with ureteroscopy and stent placement (Bilateral, 10/01/2015); Back surgery; Colon surgery (1990's); Cystoscopy with ureteroscopy and stent placement (Right, 10/19/2015); and Holmium laser application (Right, 02/04/5175). Family Her family history includes Brain cancer in her cousin; Breast cancer (age of onset: 59) in her sister; Cancer in her maternal aunt; Colon cancer in her cousin; Colon cancer (age of onset: 2) in an other family member; Colon cancer (age of onset: 21) in her sister; Colon polyps in her son, son and another family member; Dementia in her sister; Heart disease in her father and mother; Liver cancer in her maternal uncle. Social history  She reports that she has never smoked. She has never used smokeless tobacco. She reports that she does not drink alcohol and does not use drugs.  MEDICARE WELLNESS OBJECTIVES: Physical activity: Current Exercise Habits: The patient does not participate in regular exercise at present (generally very active), Exercise limited by: None identified Cardiac risk factors: Cardiac Risk Factors include: advanced age (>67men, >63 women);dyslipidemia;hypertension Depression/mood screen:   Depression screen [facility] 2/9 04/20/2020  Decreased Interest 0  Down, Depressed, Hopeless 0  PHQ - 2 Score 0  Altered sleeping 0  Tired, decreased energy 0  Change in appetite 0  Feeling bad or failure about yourself  0  Trouble concentrating 0  Moving slowly or fidgety/restless 0  Suicidal thoughts 0  PHQ-9 Score 0  Difficult doing work/chores Not difficult at all    ADLs:  In your present  state of health, do you have any difficulty performing the following activities: 04/20/2020 01/15/2020  Hearing? N N  Vision? N N  Difficulty concentrating or making decisions? N N  Walking or climbing stairs? N N  Dressing or bathing? N N  Doing errands, shopping? N N  Some recent data might be hidden     Cognitive Testing  Alert? Yes  Normal Appearance?Yes  Oriented to person? Yes  Place? Yes   Time? Yes  Recall of three objects?  Yes  Can perform simple calculations? Yes  Displays appropriate judgment?Yes  Can read the correct time from a watch face?Yes  EOL planning: Does Patient Have a Medical Advance Directive?: No Would patient like information on creating a medical advance directive?: Yes (MAU/Ambulatory/Procedural Areas - Information given)   Objective:   Today's Vitals   04/20/20 1043  BP: 132/70  Pulse: (!) 48  Temp: (!) 97.5 F (36.4 C)  SpO2: 96%  Weight: 154 lb (69.9 kg)   Body mass index is 25.63 kg/m.  General appearance: alert, no distress, WD/WN, female HEENT: normocephalic, sclerae anicteric, TMs pearly, nares patent, no discharge or erythema, pharynx normal Oral cavity: MMM, no lesions Neck: supple, no lymphadenopathy, no thyromegaly, no masses Heart: RRR, normal S1, S2, no murmurs Lungs: CTA bilaterally, no wheezes, rhonchi, or rales Abdomen: +bs, soft, non tender, non distended, no masses, no hepatomegaly, no splenomegaly Musculoskeletal: Cast present on the right ankle, nontender, no swelling, no obvious deformity Extremities: no edema, no cyanosis, no clubbing Pulses: 2+ symmetric, upper and lower extremities, normal cap refill Neurological: alert, oriented x 3, CN2-12 intact, strength normal upper extremities and lower extremities, sensation normal throughout, DTRs 2+ throughout, no cerebellar signs, gait normal Psychiatric: normal affect, behavior normal, pleasant   Medicare Attestation I have personally reviewed: The patient's medical and  social history Their use of alcohol, tobacco or illicit drugs Their current medications and supplements The patient's functional ability including ADLs,fall risks, home safety risks, cognitive, and hearing and visual impairment Diet and physical activities Evidence for depression or mood disorders  The patient's weight, height, BMI, and visual acuity have been recorded in the chart.  I have made referrals, counseling, and provided education to the patient based on review of the above and I have provided the patient with a written personalized care plan for preventive services.     Izora Ribas, NP   04/20/2020

## 2020-04-20 ENCOUNTER — Other Ambulatory Visit: Payer: Self-pay

## 2020-04-20 ENCOUNTER — Ambulatory Visit (INDEPENDENT_AMBULATORY_CARE_PROVIDER_SITE_OTHER): Payer: PPO | Admitting: Adult Health

## 2020-04-20 ENCOUNTER — Encounter: Payer: Self-pay | Admitting: Adult Health

## 2020-04-20 VITALS — BP 132/70 | HR 48 | Temp 97.5°F | Wt 154.0 lb

## 2020-04-20 DIAGNOSIS — Z0000 Encounter for general adult medical examination without abnormal findings: Secondary | ICD-10-CM

## 2020-04-20 DIAGNOSIS — Z1159 Encounter for screening for other viral diseases: Secondary | ICD-10-CM

## 2020-04-20 DIAGNOSIS — Z79899 Other long term (current) drug therapy: Secondary | ICD-10-CM | POA: Diagnosis not present

## 2020-04-20 DIAGNOSIS — R7309 Other abnormal glucose: Secondary | ICD-10-CM

## 2020-04-20 DIAGNOSIS — E559 Vitamin D deficiency, unspecified: Secondary | ICD-10-CM

## 2020-04-20 DIAGNOSIS — E663 Overweight: Secondary | ICD-10-CM | POA: Diagnosis not present

## 2020-04-20 DIAGNOSIS — R6889 Other general symptoms and signs: Secondary | ICD-10-CM | POA: Diagnosis not present

## 2020-04-20 DIAGNOSIS — I10 Essential (primary) hypertension: Secondary | ICD-10-CM

## 2020-04-20 DIAGNOSIS — Z85038 Personal history of other malignant neoplasm of large intestine: Secondary | ICD-10-CM

## 2020-04-20 DIAGNOSIS — F325 Major depressive disorder, single episode, in full remission: Secondary | ICD-10-CM

## 2020-04-20 DIAGNOSIS — R7303 Prediabetes: Secondary | ICD-10-CM | POA: Diagnosis not present

## 2020-04-20 DIAGNOSIS — Z23 Encounter for immunization: Secondary | ICD-10-CM

## 2020-04-20 DIAGNOSIS — E2839 Other primary ovarian failure: Secondary | ICD-10-CM

## 2020-04-20 DIAGNOSIS — Z0001 Encounter for general adult medical examination with abnormal findings: Secondary | ICD-10-CM

## 2020-04-20 DIAGNOSIS — R001 Bradycardia, unspecified: Secondary | ICD-10-CM

## 2020-04-20 DIAGNOSIS — E782 Mixed hyperlipidemia: Secondary | ICD-10-CM | POA: Diagnosis not present

## 2020-04-20 DIAGNOSIS — D497 Neoplasm of unspecified behavior of endocrine glands and other parts of nervous system: Secondary | ICD-10-CM | POA: Diagnosis not present

## 2020-04-20 DIAGNOSIS — Z6826 Body mass index (BMI) 26.0-26.9, adult: Secondary | ICD-10-CM

## 2020-04-20 DIAGNOSIS — I471 Supraventricular tachycardia: Secondary | ICD-10-CM

## 2020-04-20 DIAGNOSIS — K219 Gastro-esophageal reflux disease without esophagitis: Secondary | ICD-10-CM | POA: Diagnosis not present

## 2020-04-20 MED ORDER — BISOPROLOL-HYDROCHLOROTHIAZIDE 2.5-6.25 MG PO TABS: 1.0000 | ORAL_TABLET | Freq: Every day | ORAL | 1 refills | Status: DC

## 2020-04-20 NOTE — Patient Instructions (Addendum)
Kristen [name] , Thank you for taking time to come for your Medicare Wellness Visit. I appreciate your ongoing commitment to your health goals. Please review the following plan we discussed and let me know if I can assist you in the future.   These are the goals we discussed: Goals    . Blood Pressure < 130/80    . DIET - INCREASE WATER INTAKE       This is a list of the screening recommended for you and due dates:  Health Maintenance  Topic Date Due  .  Hepatitis C: One time screening is recommended by Center for Disease Control  (CDC) for  adults born from [birth year] through 1966.   Never done  . COVID-19 Vaccine (1) Never done  . DEXA scan (bone density measurement)  Never done  . Flu Shot  01/05/2020  . Tetanus Vaccine  11/30/2025  . Pneumonia vaccines  Completed      HOW TO SCHEDULE A MAMMOGRAM and BONE DENSITY  [facility]  7 a.m.-6:30 p.m., Monday 7 a.m.-5 p.m., Tuesday-Friday Schedule an appointment by calling [phone].       Health Maintenance After Age 52 After age 69, you are at a higher risk for certain long-term diseases and infections as well as injuries from falls. Falls are a major cause of broken bones and head injuries in people who are older than age 8. Getting regular preventive care can help to keep you healthy and well. Preventive care includes getting regular testing and making lifestyle changes as recommended by your health care provider. Talk with your health care provider about:  Which screenings and tests you should have. A screening is a test that checks for a disease when you have no symptoms.  A diet and exercise plan that is right for you. What should I know about screenings and tests to prevent falls? Screening and testing are the best ways to find a health problem early. Early diagnosis and treatment give you the best chance of managing medical conditions that are common after age 70. Certain conditions and lifestyle  choices may make you more likely to have a fall. Your health care provider may recommend:  Regular vision checks. Poor vision and conditions such as cataracts can make you more likely to have a fall. If you wear glasses, make sure to get your prescription updated if your vision changes.  Medicine review. Work with your health care provider to regularly review all of the medicines you are taking, including over-the-counter medicines. Ask your health care provider about any side effects that may make you more likely to have a fall. Tell your health care provider if any medicines that you take make you feel dizzy or sleepy.  Osteoporosis screening. Osteoporosis is a condition that causes the bones to get weaker. This can make the bones weak and cause them to break more easily.  Blood pressure screening. Blood pressure changes and medicines to control blood pressure can make you feel dizzy.  Strength and balance checks. Your health care provider may recommend certain tests to check your strength and balance while standing, walking, or changing positions.  Foot health exam. Foot pain and numbness, as well as not wearing proper footwear, can make you more likely to have a fall.  Depression screening. You may be more likely to have a fall if you have a fear of falling, feel emotionally low, or feel unable to do activities that you used to do.  Alcohol use screening. Using too much alcohol can affect your balance and may make you more likely to have a fall. What actions can I take to lower my risk of falls? General instructions  Talk with your health care provider about your risks for falling. Tell your health care provider if: ? You fall. Be sure to tell your health care provider about all falls, even ones that seem minor. ? You feel dizzy, sleepy, or off-balance.  Take over-the-counter and prescription medicines only as told by your health care provider. These include any supplements.  Eat a  healthy diet and maintain a healthy weight. A healthy diet includes low-fat dairy products, low-fat (lean) meats, and fiber from whole grains, beans, and lots of fruits and vegetables. Home safety  Remove any tripping hazards, such as rugs, cords, and clutter.  Install safety equipment such as grab bars in bathrooms and safety rails on stairs.  Keep rooms and walkways well-lit. Activity   Follow a regular exercise program to stay fit. This will help you maintain your balance. Ask your health care provider what types of exercise are appropriate for you.  If you need a cane or walker, use it as recommended by your health care provider.  Wear supportive shoes that have nonskid soles. Lifestyle  Do not drink alcohol if your health care provider tells you not to drink.  If you drink alcohol, limit how much you have: ? 0-1 drink a day for women. ? 0-2 drinks a day for men.  Be aware of how much alcohol is in your drink. In the [place] one drink equals one typical bottle of beer (12 oz), one-half glass of wine (5 oz), or one shot of hard liquor (1 oz).  Do not use any products that contain nicotine or tobacco, such as cigarettes and e-cigarettes. If you need help quitting, ask your health care provider. Summary  Having a healthy lifestyle and getting preventive care can help to protect your health and wellness after age 52.  Screening and testing are the best way to find a health problem early and help you avoid having a fall. Early diagnosis and treatment give you the best chance for managing medical conditions that are more common for people who are older than age 50.  Falls are a major cause of broken bones and head injuries in people who are older than age 61. Take precautions to prevent a fall at home.  Work with your health care provider to learn what changes you can make to improve your health and wellness and to prevent falls. This information is not intended to replace advice  given to you by your health care provider. Make sure you discuss any questions you have with your health care provider. Document Revised: 09/13/2018 Document Reviewed: 04/05/2017 Elsevier Patient Education  2020 [employer].

## 2020-04-21 ENCOUNTER — Other Ambulatory Visit: Payer: Self-pay | Admitting: Adult Health

## 2020-04-21 DIAGNOSIS — E782 Mixed hyperlipidemia: Secondary | ICD-10-CM

## 2020-04-21 LAB — HEPATITIS C ANTIBODY
Hepatitis C Ab: NONREACTIVE
SIGNAL TO CUT-OFF: 0.01 (ref <1.00)

## 2020-04-21 LAB — CBC WITH DIFFERENTIAL/PLATELET
Absolute Monocytes: 341 cells/uL (ref 200–950)
Basophils Absolute: 50 cells/uL (ref 0–200)
Basophils Relative: 0.9 %
Eosinophils Absolute: 198 cells/uL (ref 15–500)
Eosinophils Relative: 3.6 %
HCT: 35.8 % (ref 35.0–45.0)
Hemoglobin: 11.9 g/dL (ref 11.7–15.5)
Lymphs Abs: 1045 cells/uL (ref 850–3900)
MCH: 29.2 pg (ref 27.0–33.0)
MCHC: 33.2 g/dL (ref 32.0–36.0)
MCV: 88 fL (ref 80.0–100.0)
MPV: 10.9 fL (ref 7.5–12.5)
Monocytes Relative: 6.2 %
Neutro Abs: 3867 cells/uL (ref 1500–7800)
Neutrophils Relative %: 70.3 %
Platelets: 263 10*3/uL (ref 140–400)
RBC: 4.07 10*6/uL (ref 3.80–5.10)
RDW: 13.7 % (ref 11.0–15.0)
Total Lymphocyte: 19 %
WBC: 5.5 10*3/uL (ref 3.8–10.8)

## 2020-04-21 LAB — COMPLETE METABOLIC PANEL WITH GFR
AG Ratio: 2 (calc) (ref 1.0–2.5)
ALT: 11 U/L (ref 6–29)
AST: 14 U/L (ref 10–35)
Albumin: 4.2 g/dL (ref 3.6–5.1)
Alkaline phosphatase (APISO): 69 U/L (ref 37–153)
BUN: 10 mg/dL (ref 7–25)
CO2: 28 mmol/L (ref 20–32)
Calcium: 9.3 mg/dL (ref 8.6–10.4)
Chloride: 108 mmol/L (ref 98–110)
Creat: 0.66 mg/dL (ref 0.60–0.93)
GFR, Est African American: 97 mL/min/{1.73_m2} (ref >=60)
GFR, Est Non African American: 84 mL/min/{1.73_m2} (ref >=60)
Globulin: 2.1 g/dL (calc) (ref 1.9–3.7)
Glucose, Bld: 106 mg/dL — ABNORMAL HIGH (ref 65–99)
Potassium: 4.2 mmol/L (ref 3.5–5.3)
Sodium: 142 mmol/L (ref 135–146)
Total Bilirubin: 0.3 mg/dL (ref 0.2–1.2)
Total Protein: 6.3 g/dL (ref 6.1–8.1)

## 2020-04-21 LAB — LIPID PANEL
Cholesterol: 207 mg/dL — ABNORMAL HIGH (ref <200)
HDL: 43 mg/dL — ABNORMAL LOW (ref >=50)
LDL Cholesterol (Calc): 139 mg/dL (calc) — ABNORMAL HIGH
Non-HDL Cholesterol (Calc): 164 mg/dL (calc) — ABNORMAL HIGH (ref <130)
Total CHOL/HDL Ratio: 4.8 (calc) (ref <5.0)
Triglycerides: 123 mg/dL (ref <150)

## 2020-04-21 LAB — TSH: TSH: 1.97 mIU/L (ref 0.40–4.50)

## 2020-04-21 LAB — MAGNESIUM: Magnesium: 2 mg/dL (ref 1.5–2.5)

## 2020-04-21 MED ORDER — ATORVASTATIN CALCIUM 40 MG PO TABS: ORAL_TABLET | ORAL | 3 refills | Status: DC

## 2020-06-03 ENCOUNTER — Other Ambulatory Visit: Payer: Self-pay | Admitting: Internal Medicine

## 2020-06-03 DIAGNOSIS — K219 Gastro-esophageal reflux disease without esophagitis: Secondary | ICD-10-CM

## 2020-06-03 DIAGNOSIS — I10 Essential (primary) hypertension: Secondary | ICD-10-CM

## 2020-06-29 ENCOUNTER — Other Ambulatory Visit: Payer: Self-pay | Admitting: Adult Health

## 2020-06-29 DIAGNOSIS — E2839 Other primary ovarian failure: Secondary | ICD-10-CM

## 2020-07-26 ENCOUNTER — Encounter: Payer: Self-pay | Admitting: Internal Medicine

## 2020-07-26 NOTE — Progress Notes (Signed)
History of Present Illness:       This very nice 80 y.o. WWF presents for 6 month follow up with HTN, HLD, Pre-Diabetes and Vitamin D Deficiency.       Patient is treated for HTN (1999) & BP has been controlled at home. Today's BP is at goal - 136/84.  Patient had a negative Cardiolite in 2011. Patient has had no complaints of any cardiac type chest pain, palpitations, dyspnea / orthopnea / PND, dizziness, claudication, or dependent edema.      Hyperlipidemia is not controlled with diet & off lipitor at last OV. Patient denies myalgias or other med SE's. Last Lipids were not at goal:  Lab Results  Component Value Date   CHOL 207 (H) 04/20/2020   HDL 43 (L) 04/20/2020   LDLCALC 139 (H) 04/20/2020   TRIG 123 04/20/2020   CHOLHDL 4.8 04/20/2020    Also, the patient has history of PreDiabetes(A1c 5.9% /I 90/2011) and has had no symptoms of reactive hypoglycemia, diabetic polys, paresthesias or visual blurring.  Last A1c was   Lab Results  Component Value Date   HGBA1C 5.9 (H) 01/16/2020           Further, the patient also has history of Vitamin D Deficiency ("31" /2008) and supplements vitamin D without any suspected side-effects. Last vitamin D was at goal:  Lab Results  Component Value Date   VD25OH 62 01/16/2020    Current Outpatient Medications on File Prior to Visit  Medication Sig  . amLODipine 2.5 MG  Take      1 tablet      Daily      for BP  . aspirin EC 81 MG  Take  daily.  Marland Kitchen atorvastatin \\40  MG  Take 1 tablet Daily for Cholesterol  . bisoprolol-hctz 2.5-6.25 MG Take 1 tablet /daily.  . Vitamin D 5,000 Units   Takes 3 caps = 15,000 units /day  . gabapentin 100 MG cap Take 1 to 3 capsules 2 to 3 x /day as needed   . meclizine  25 MG  Take 1/2 to 1 tablet 2 to 3 x /day as needed   . Meloxicam 15 MG  Take 1/2 to 1 tablet daily with  Food   . olmesartan 40 MG tablet Take 1 tablet Daily for BP  . pantoprazole  40 MG tablet Take 1 tablet Daily       . PARoxetine  30 MG tablet Take 1 tablet Daily      Allergies  Allergen Reactions  . Aspirin Other (See Comments)    High Doses stomach trouble  . Codeine Nausea And Vomiting  . Fenofibrate Cough  . Hydrochlorothiazide Other (See Comments)    Fatigue  . Metoclopramide Other (See Comments)    Sedation    PMHx:   Past Medical History:  Diagnosis Date  . Arthritis   . Colon cancer ([place]) 1990s, 2014  . Colon polyps   . Depression   . Dysrhythmia 04/2012   SVT - RATE 145 POST OP CRANIOTOMY SURGERY --TX'D AND RESOLVED--PT STATES NO FURTHER PROBLEM THAT SHE IS AWARE OF -SHE HAS NOT HAD TO SEE CARDIOLOGIST  . GERD (gastroesophageal reflux disease)   . Headache(784.0)    hx migraines  . Hypertension    pt states she has not taken any b/p medication since her brain surgery in oct 3013--took herself off the medication  . Pituitary mass ([place]) NOV 2013   MASS FOUND AFTER  PT EXPERIENCED BILATERAL BLURRED / HAZY VISION -ESP LEFT EYE.  S/P CRANIOTOMY AND EYESIGHT RETURNED TO NORMAL--PT STILL HAS A LOT OF TENDERNESS RIGHT SIDE OF NOSE --THE CRANIOTOMY WAS DONE BY TRANSNASAL APPROACH  . Pneumonia yrs ago   hx of pneumonia  . PONV (postoperative nausea and vomiting)    PT STATES PLEASE NOTE SHE HAS TENDERNESS RIGHT SIDE OF NOSE - SINCE TRANSNASAL CRANIOTOMY 04/2012  . Prediabetes   . Vitamin D deficiency     Immunization History  Administered Date(s) Administered  . Influenza Split 04/19/2012  . Influenza, High Dose Seasonal PF 04/17/2013, 03/12/2014, 03/03/2017, 04/12/2018, 04/17/2019, 04/20/2020  . Pneumococcal Conjugate-13 09/30/2014  . Pneumococcal Polysaccharide-23 04/19/2012  . Td 07/20/2005, 12/01/2015    Past Surgical History:  Procedure Laterality Date  . BACK SURGERY     lower  . CATARACT EXTRACTION  June/july 2013   bilateral eyes  . COLON RESECTION  1988  . COLON SURGERY  1990's  . COLONOSCOPY  09/20/2011   Procedure: COLONOSCOPY;  Surgeon: Missy Sabins, MD;  Location: [facility];  Service: Endoscopy;  Laterality: N/A;  . COLONOSCOPY  12/15/2011   Procedure: COLONOSCOPY;  Surgeon: Missy Sabins, MD;  Location: [facility];  Service: Endoscopy;  Laterality: N/A;  . COLONOSCOPY  01/26/2012   Procedure: COLONOSCOPY;  Surgeon: Cleotis Nipper, MD;  Location: [facility];  Service: Endoscopy;  Laterality: N/A;  . CRANIOTOMY  04/18/2012   Procedure: CRANIOTOMY HYPOPHYSECTOMY TRANSNASAL APPROACH;  Surgeon: Ophelia Charter, MD;  Location: [facility];  Service: Neurosurgery;  Laterality: Bilateral;  Transphenoidal resection of Pituitary tumor.   . CYSTOSCOPY WITH URETEROSCOPY AND STENT PLACEMENT Bilateral 10/01/2015   Procedure: CYSTOSCOPY, retrograde WITH URETEROSCOPY AND STENT PLACEMENT, ;  Surgeon: Raynelle Bring, MD;  Location: [facility];  Service: Urology;  Laterality: Bilateral;  . CYSTOSCOPY WITH URETEROSCOPY AND STENT PLACEMENT Right 10/19/2015   Procedure: CYSTOSCOPY,  RIGHT URETEROSCOPY, HOLMIUM LASER RIGHT URETERAL STONE, RIGHT URETERAL STENT PLACEMENT, LEFT URETEROSCOPY, INSERTION LEFT URETERAL STENT;  Surgeon: Raynelle Bring, MD;  Location: [facility];  Service: Urology;  Laterality: Right;  . EYE SURGERY     cataract bil  . HOLMIUM LASER APPLICATION Right 1/61/0960   Procedure: HOLMIUM LASER  ;  Surgeon: Raynelle Bring, MD;  Location: [facility];  Service: Urology;  Laterality: Right;  . HOT HEMOSTASIS  09/20/2011   Procedure: HOT HEMOSTASIS (ARGON PLASMA COAGULATION/BICAP);  Surgeon: Missy Sabins, MD;  Location: [facility];  Service: Endoscopy;  Laterality: N/A;  . LAPAROSCOPIC RIGHT HEMI COLECTOMY  06/27/2012   Procedure: LAPAROSCOPIC RIGHT HEMI COLECTOMY;  Surgeon: Pedro Earls, MD;  Location: [facility];  Service: General;  Laterality: N/A;  Laparoscopic Assited Right Hemicolectomy  . TRANSNASAL APPROACH  04/18/2012   Procedure: TRANSNASAL APPROACH;  Surgeon: Rozetta Nunnery, MD;  Location: [facility];  Service: ENT;  Laterality: Bilateral;  Transnasal  approach  . TUMOR REMOVAL     Left leg    FHx:    Reviewed / unchanged  SHx:    Reviewed / unchanged   Systems Review:  Constitutional: Denies fever, chills, wt changes, headaches, insomnia, fatigue, night sweats, change in appetite. Eyes: Denies redness, blurred vision, diplopia, discharge, itchy, watery eyes.  ENT: Denies discharge, congestion, post nasal drip, epistaxis, sore throat, earache, hearing loss, dental pain, tinnitus, vertigo, sinus pain, snoring.  CV: Denies chest pain, palpitations, irregular heartbeat, syncope, dyspnea, diaphoresis, orthopnea, PND, claudication or edema. Respiratory: denies cough, dyspnea, DOE, pleurisy, hoarseness, laryngitis, wheezing.  Gastrointestinal: Denies dysphagia, odynophagia, heartburn, reflux, water brash, abdominal pain or cramps, nausea, vomiting, bloating, diarrhea, constipation, hematemesis, melena, hematochezia  or hemorrhoids. Genitourinary: Denies dysuria, frequency, urgency, nocturia, hesitancy, discharge, hematuria or flank pain. Musculoskeletal: Denies arthralgias, myalgias, stiffness, jt. swelling, pain, limping or strain/sprain.  Skin: Denies pruritus, rash, hives, warts, acne, eczema or change in skin lesion(s). Neuro: No weakness, tremor, incoordination, spasms, paresthesia or pain. Psychiatric: Denies confusion, memory loss or sensory loss. Endo: Denies change in weight, skin or hair change.  Heme/Lymph: No excessive bleeding, bruising or enlarged lymph nodes.  Physical Exam  BP 136/84   Pulse (!) 47   Temp (!) 97 F (36.1 C)   Resp 16   Ht 5\' 5"  (1.651 m)   Wt 151 lb 6.4 oz (68.7 kg)   SpO2 97%   BMI 25.19 kg/m   Appears  well nourished, well groomed  and in no distress.  Eyes: PERRLA, EOMs, conjunctiva no swelling or erythema. Sinuses: No frontal/maxillary tenderness ENT/Mouth: EAC's clear, TM's nl w/o erythema, bulging. Nares clear w/o erythema, swelling, exudates. Oropharynx clear without erythema or exudates.  Oral hygiene is good. Tongue normal, non obstructing. Hearing intact.  Neck: Supple. Thyroid not palpable. Car 2+/2+ without bruits, nodes or JVD. Chest: Respirations nl with BS clear & equal w/o rales, rhonchi, wheezing or stridor.  Cor: Heart sounds normal w/ regular rate and rhythm without sig. murmurs, gallops, clicks or rubs. Peripheral pulses normal and equal  without edema.  Abdomen: Soft & bowel sounds normal. Non-tender w/o guarding, rebound, hernias, masses or organomegaly.  Lymphatics: Unremarkable.  Musculoskeletal: Full ROM all peripheral extremities, joint stability, 5/5 strength and normal gait.  Skin: Warm, dry without exposed rashes, lesions or ecchymosis apparent.  Neuro: Cranial nerves intact, reflexes equal bilaterally. Sensory-motor testing grossly intact. Tendon reflexes grossly intact.  Pysch: Alert & oriented x 3.  Insight and judgement nl & appropriate. No ideations.  Assessment and Plan:  1. Essential hypertension  - Continue medication, monitor blood pressure at home.  - Continue DASH diet.  Reminder to go to the ER if any CP,  SOB, nausea, dizziness, severe HA, changes vision/speech.  - CBC with Differential/Platelet - COMPLETE METABOLIC PANEL WITH GFR - Magnesium - TSH  2. Hyperlipidemia, mixed  - Continue diet/meds, exercise,& lifestyle modifications.  - Continue monitor periodic cholesterol/liver & renal functions    - Lipid panel - TSH  3. Abnormal glucose  - Continue diet, exercise  - Lifestyle modifications.  - Monitor appropriate labs.  - Hemoglobin A1c - Insulin, random  4. Vitamin D deficiency  - Continue supplementation.  - VITAMIN D 25 Hydroxy   5. Medication management  - CBC with Differential/Platelet - COMPLETE METABOLIC PANEL WITH GFR - Magnesium - Lipid panel - TSH - Hemoglobin A1c - Insulin, random - VITAMIN D 25 Hydroxy        Discussed  regular exercise, BP monitoring, weight control to achieve/maintain BMI less  than 25 and discussed med and SE's. Recommended labs to assess and monitor clinical status with further disposition pending results of labs.  I discussed the assessment and treatment plan with the patient. The patient was provided an opportunity to ask questions and all were answered. The patient agreed with the plan and demonstrated an understanding of the instructions.  I provided over 30 minutes of exam, counseling, chart review and  complex critical decision making.         The patient was advised to call back or seek an in-person  evaluation if the symptoms worsen or if the condition fails to improve as anticipated.   Kirtland Bouchard, MD

## 2020-07-26 NOTE — Patient Instructions (Signed)

## 2020-07-27 ENCOUNTER — Other Ambulatory Visit: Payer: Self-pay

## 2020-07-27 ENCOUNTER — Ambulatory Visit (INDEPENDENT_AMBULATORY_CARE_PROVIDER_SITE_OTHER): Payer: PPO | Admitting: Internal Medicine

## 2020-07-27 VITALS — BP 136/84 | HR 47 | Temp 97.0°F | Resp 16 | Ht 65.0 in | Wt 151.4 lb

## 2020-07-27 DIAGNOSIS — I10 Essential (primary) hypertension: Secondary | ICD-10-CM

## 2020-07-27 DIAGNOSIS — R7309 Other abnormal glucose: Secondary | ICD-10-CM | POA: Diagnosis not present

## 2020-07-27 DIAGNOSIS — E782 Mixed hyperlipidemia: Secondary | ICD-10-CM | POA: Diagnosis not present

## 2020-07-27 DIAGNOSIS — E559 Vitamin D deficiency, unspecified: Secondary | ICD-10-CM

## 2020-07-27 DIAGNOSIS — Z79899 Other long term (current) drug therapy: Secondary | ICD-10-CM

## 2020-07-28 LAB — COMPLETE METABOLIC PANEL WITH GFR
AG Ratio: 2 (calc) (ref 1.0–2.5)
ALT: 12 U/L (ref 6–29)
AST: 12 U/L (ref 10–35)
Albumin: 4.3 g/dL (ref 3.6–5.1)
Alkaline phosphatase (APISO): 72 U/L (ref 37–153)
BUN: 12 mg/dL (ref 7–25)
CO2: 31 mmol/L (ref 20–32)
Calcium: 10 mg/dL (ref 8.6–10.4)
Chloride: 105 mmol/L (ref 98–110)
Creat: 0.77 mg/dL (ref 0.60–0.88)
GFR, Est African American: 85 mL/min/{1.73_m2} (ref >=60)
GFR, Est Non African American: 73 mL/min/{1.73_m2} (ref >=60)
Globulin: 2.2 g/dL (calc) (ref 1.9–3.7)
Glucose, Bld: 93 mg/dL (ref 65–99)
Potassium: 4.3 mmol/L (ref 3.5–5.3)
Sodium: 142 mmol/L (ref 135–146)
Total Bilirubin: 0.6 mg/dL (ref 0.2–1.2)
Total Protein: 6.5 g/dL (ref 6.1–8.1)

## 2020-07-28 LAB — LIPID PANEL
Cholesterol: 134 mg/dL (ref <200)
HDL: 44 mg/dL — ABNORMAL LOW (ref >=50)
LDL Cholesterol (Calc): 69 mg/dL (calc)
Non-HDL Cholesterol (Calc): 90 mg/dL (calc) (ref <130)
Total CHOL/HDL Ratio: 3 (calc) (ref <5.0)
Triglycerides: 128 mg/dL (ref <150)

## 2020-07-28 LAB — CBC WITH DIFFERENTIAL/PLATELET
Absolute Monocytes: 329 cells/uL (ref 200–950)
Basophils Absolute: 49 cells/uL (ref 0–200)
Basophils Relative: 0.8 %
Eosinophils Absolute: 153 cells/uL (ref 15–500)
Eosinophils Relative: 2.5 %
HCT: 38.2 % (ref 35.0–45.0)
Hemoglobin: 12.8 g/dL (ref 11.7–15.5)
Lymphs Abs: 1281 cells/uL (ref 850–3900)
MCH: 29.6 pg (ref 27.0–33.0)
MCHC: 33.5 g/dL (ref 32.0–36.0)
MCV: 88.2 fL (ref 80.0–100.0)
MPV: 11.2 fL (ref 7.5–12.5)
Monocytes Relative: 5.4 %
Neutro Abs: 4288 cells/uL (ref 1500–7800)
Neutrophils Relative %: 70.3 %
Platelets: 247 10*3/uL (ref 140–400)
RBC: 4.33 10*6/uL (ref 3.80–5.10)
RDW: 13.1 % (ref 11.0–15.0)
Total Lymphocyte: 21 %
WBC: 6.1 10*3/uL (ref 3.8–10.8)

## 2020-07-28 LAB — HEMOGLOBIN A1C
Hgb A1c MFr Bld: 5.9 % of total Hgb — ABNORMAL HIGH (ref <5.7)
Mean Plasma Glucose: 123 mg/dL
eAG (mmol/L): 6.8 mmol/L

## 2020-07-28 LAB — INSULIN, RANDOM: Insulin: 4.5 u[IU]/mL

## 2020-07-28 LAB — TSH: TSH: 2.08 mIU/L (ref 0.40–4.50)

## 2020-07-28 LAB — VITAMIN D 25 HYDROXY (VIT D DEFICIENCY, FRACTURES): Vit D, 25-Hydroxy: 58 ng/mL (ref 30–100)

## 2020-07-28 LAB — MAGNESIUM: Magnesium: 1.9 mg/dL (ref 1.5–2.5)

## 2020-07-28 NOTE — Progress Notes (Signed)
========================================================== ==========================================================  -   Total Chol = 134 and LDL 69 - Both  Excellent   - Very low risk for Heart Attack  / Stroke ========================================================== ==========================================================  -  A1c = 5.9%  still in the early Diabetic Range  so need to work harder with diet & Lose Weight    - Avoid Sweets, Candy & White Stuff   - Rice, Potatoes, Breads &  Pasta ========================================================== ==========================================================  -  Vitamin D = 58 - Great  ========================================================== ==========================================================  -  All Else - CBC - Kidneys - Electrolytes - Liver - Magnesium & Thyroid    - all  Normal / OK ========================================================== ==========================================================

## 2020-09-22 ENCOUNTER — Other Ambulatory Visit: Payer: Self-pay | Admitting: Internal Medicine

## 2020-09-22 DIAGNOSIS — F325 Major depressive disorder, single episode, in full remission: Secondary | ICD-10-CM

## 2020-09-22 DIAGNOSIS — I10 Essential (primary) hypertension: Secondary | ICD-10-CM

## 2020-09-22 DIAGNOSIS — K219 Gastro-esophageal reflux disease without esophagitis: Secondary | ICD-10-CM

## 2020-10-28 ENCOUNTER — Ambulatory Visit: Payer: PPO | Admitting: Adult Health

## 2020-11-18 ENCOUNTER — Encounter: Payer: PPO | Admitting: Internal Medicine

## 2020-11-23 ENCOUNTER — Other Ambulatory Visit: Payer: Self-pay

## 2020-11-23 ENCOUNTER — Ambulatory Visit
Admission: RE | Admit: 2020-11-23 | Discharge: 2020-11-23 | Disposition: A | Discharge status: Home / Self Care | Payer: PPO | Source: Ambulatory Visit | Attending: Adult Health | Admitting: Adult Health

## 2020-11-23 ENCOUNTER — Encounter: Payer: Self-pay | Admitting: Adult Health

## 2020-11-23 DIAGNOSIS — M8580 Other specified disorders of bone density and structure, unspecified site: Secondary | ICD-10-CM | POA: Insufficient documentation

## 2020-11-23 DIAGNOSIS — M85852 Other specified disorders of bone density and structure, left thigh: Secondary | ICD-10-CM | POA: Diagnosis not present

## 2020-11-23 DIAGNOSIS — E2839 Other primary ovarian failure: Secondary | ICD-10-CM

## 2020-11-23 DIAGNOSIS — Z780 Asymptomatic menopausal state: Secondary | ICD-10-CM | POA: Diagnosis not present

## 2020-12-17 ENCOUNTER — Ambulatory Visit: Payer: PPO | Admitting: Adult Health

## 2020-12-17 NOTE — Progress Notes (Deleted)
MEDICARE ANNUAL WELLNESS VISIT AND FOLLOW UP Assessment:    Encounter for Medicare annual wellness exam Patient agrees to schedule mammogram for fall, dexa  SVT (supraventricular tachycardia) ([place]) No known recurrence, continue BB; monitor   Hypertension Will reduce BB due to bradycardia; start ziac 2.5/6.25 mg, monitor BP and pulse Monitor blood pressure at home; call if consistently over 130/80 Continue DASH diet.   Reminder to go to the ER if any CP, SOB, nausea, dizziness, severe HA, changes vision/speech, left arm numbness and tingling and jaw pain.  Gastroesophageal reflux disease, esophagitis presence not specified Well managed on current medications Discussed diet, avoiding triggers and other lifestyle changes  Pituitary tumor (excised 2013) S/p excision, doing well   Vitamin D deficiency Continue supplementation Defer vitamin D level to CPE  Prediabetes Discussed disease and risks Discussed diet/exercise, weight management  A1C q56m; monitor serum glucose, weight   BMI 26  Long discussion about weight, diet, and exercise Recommended diet heavy in fruits and veggies and low in animal meats, cheeses, and dairy products, appropriate calorie intake Discussed appropriate weight for height  Follow up at next visit  Medication management CBC, CMP/GFR, magnesium  Major depression in remission ([place]) Continue medications  Lifestyle discussed: diet/exerise, sleep hygiene, stress management, hydration  Hyperlipidemia Continue medications Continue low cholesterol diet and exercise.  Check lipid panel.   History of colon cancer, stage II Colonoscopy 06/2018; has been released  Bradycardia Sinus bradycardia per EKG 01/2020, taking bisoprolol 5 mg, will reduce to 2.5 mg and monitor, may need to taper off -  Monitor for fatigue, dizziness  Need for influenza vaccine High dose quadrivalent administered without complication today    Over 30 minutes of exam, counseling,  chart review, and critical decision making was performed  Future Appointments  Date Time Provider [place]  12/17/2020  4:00 PM Liane Comber, NP [facility] None  01/22/2021 10:00 AM Unk Pinto, MD [facility] None  04/20/2021 11:00 AM Liane Comber, NP [facility] None     Plan:   During the course of the visit the patient was educated and counseled about appropriate screening and preventive services including:   Pneumococcal vaccine  Influenza vaccine Prevnar 13 Td vaccine Screening electrocardiogram Colorectal cancer screening Diabetes screening Glaucoma screening Nutrition counseling    Subjective:  Kristen [name] is a 80 y.o. female who presents for Medicare Annual Wellness Visit and 3 month follow up for HTN, hyperlipidemia, prediabetes, and vitamin D Def.   In 2013, patient underwent resection of a pituitary tumor and has done well since. She follows up with ? Neurology, reports last 07/2018, will request reports.   She doesn't have car or license, her friend/neighbor is available to drive when needed.   She has a history of colon cancer over 10 years ago, resolved with resection, had recent colonoscopy in 06/2018. Has been advised no further colonoscopies.   BMI is There is no height or weight on file to calculate BMI., she has been working on diet, mainly practices moderation, eating out of her neighbor's vegetable garden during the summer. Generally very active, lots of hours in yard.  Wt Readings from Last 3 Encounters:  07/27/20 151 lb 6.4 oz (68.7 kg)  04/20/20 154 lb (69.9 kg)  01/16/20 156 lb 12.8 oz (71.1 kg)   She has hx of SVT in 2013, has been on bisoprolol 10 mg daily without suspected reoccurrence  Her blood pressure has been controlled at home, today their BP is    Pulse has  been low, 40s in office, hasn't been checking at home, taking bisoprolol 5 mg with olmesartan 40 mg daily  She does not workout. She denies chest pain, shortness of  breath, she does endorse some episodes of dizziness with sudden positions.   Atorvastatin 80 mg daily She is on cholesterol medication and denies myalgias. Her cholesterol is not at goal. The cholesterol last visit was:   Lab Results  Component Value Date   CHOL 134 07/27/2020   HDL 44 (L) 07/27/2020   LDLCALC 69 07/27/2020   TRIG 128 07/27/2020   CHOLHDL 3.0 07/27/2020   Historically Mrs. Moffat has had well controlled prediabetes secondary to diet and exercise Lab Results  Component Value Date   HGBA1C 5.9 (H) 07/27/2020   Last GFR Lab Results  Component Value Date   GFRNONAA 73 07/27/2020   Patient is on Vitamin D supplement.   Lab Results  Component Value Date   VD25OH 58 07/27/2020      Medication Review: Current Outpatient Medications on File Prior to Visit  Medication Sig Dispense Refill   amLODipine (NORVASC) 2.5 MG tablet Take 1 tablet by mouth once daily for blood pressure 90 tablet 0   aspirin EC 81 MG tablet Take 81 mg by mouth daily.     atorvastatin (LIPITOR) 40 MG tablet Take 1 tablet Daily for Cholesterol 90 tablet 3   bisoprolol-hydrochlorothiazide (ZIAC) 2.5-6.25 MG tablet Take 1 tablet by mouth daily. 90 tablet 1   CHOLECALCIFEROL PO Take 5,000 Units by mouth in the morning, at noon, and at bedtime.      gabapentin (NEURONTIN) 100 MG capsule TAKE 1 TO 3 CAPSULES BY MOUTH TWO TO THREE TIMES DAILY AS NEEDED FOR NEUROPATHY PAINS 270 capsule 0   meclizine (ANTIVERT) 25 MG tablet Take 1/2 to 1 tablet 2 to 3 x /day as needed for Dizziness / Vertigo 90 tablet 0   meloxicam (MOBIC) 15 MG tablet Take 1/2 to 1 tablet daily with  Food for pain  & Inflammation- try to limit to max 5 days   /week (Patient taking differently: as needed. Take 1/2 to 1 tablet daily with  Food for pain  & Inflammation- try to limit to max 5 days   /week) 90 tablet 0   olmesartan (BENICAR) 40 MG tablet Take 1 tablet by mouth once daily for blood pressure 90 tablet 0   pantoprazole (PROTONIX)  40 MG tablet TAKE 1 TABLET BY MOUTH ONCE DAILY FOR INDIGESTION AND HEARTBURN 90 tablet 0   PARoxetine (PAXIL) 30 MG tablet Take 1 tablet by mouth once daily 90 tablet 0   No current facility-administered medications on file prior to visit.    Allergies: Allergies  Allergen Reactions   Aspirin Other (See Comments)    High Doses stomach trouble   Codeine Nausea And Vomiting   Fenofibrate Cough   Hydrochlorothiazide Other (See Comments)    Fatigue   Metoclopramide Other (See Comments)    Sedation    Current Problems (verified) has SVT (supraventricular tachycardia) ([place]); GERD (gastroesophageal reflux disease); Major depression in remission ([place]); Overweight (BMI 25.0-29.9); History of colon cancer, stage II; Vitamin D deficiency; Prediabetes; Hyperlipidemia, mixed; Medication management; Pituitary tumor (excised 2013); Essential hypertension; Abnormal glucose; Bradycardia; and Osteopenia on their problem list.  Screening Tests Immunization History  Administered Date(s) Administered   Influenza Split 04/19/2012   Influenza, High Dose Seasonal PF 04/17/2013, 03/12/2014, 03/03/2017, 04/12/2018, 04/17/2019, 04/20/2020   Pneumococcal Conjugate-13 09/30/2014   Pneumococcal Polysaccharide-23 04/19/2012   Td  07/20/2005, 12/01/2015    Preventative care: DEXA: agreeable to schedule  Mammogram:  2015 - discussed and given breast center number to schedule  Last colonoscopy: 2015 - 06/2018 - DONE  Prior vaccinations: TD or Tdap: 2017  Influenza: 2020, TODAY  Pneumococcal: 2013 Prevnar13: 2016  Covid 19: declines, just had covid 19 in Oct  Names of Other Physician/Practitioners you currently use: 1. [facility] here for primary care 2. Dr. Gershon Crane, eye doctor, last visit 2019, needs to schedule follow up  3. Dr. Seward Grater, dentist, last visit 2021, have partial   Patient Care Team: Unk Pinto, MD as PCP - General (Internal Medicine) Zadie Rhine  Clent Demark, MD as Consulting Physician (Ophthalmology)  Surgical: She  has a past surgical history that includes Colon resection (1988); Colonoscopy (09/20/2011); Hot hemostasis (09/20/2011); Cataract extraction (June/july 2013); Colonoscopy (12/15/2011); Colonoscopy (01/26/2012); Eye surgery; Tumor removal; Craniotomy (04/18/2012); Transnasal approach (04/18/2012); Laparoscopic right hemi colectomy (06/27/2012); Cystoscopy with ureteroscopy and stent placement (Bilateral, 10/01/2015); Back surgery; Colon surgery (1990's); Cystoscopy with ureteroscopy and stent placement (Right, 10/19/2015); and Holmium laser application (Right, 1/32/4401). Family Her family history includes Brain cancer in her cousin; Breast cancer (age of onset: 40) in her sister; Cancer in her maternal aunt; Colon cancer in her cousin; Colon cancer (age of onset: 57) in an other family member; Colon cancer (age of onset: 27) in her sister; Colon polyps in her son, son and another family member; Dementia in her sister; Heart disease in her father and mother; Liver cancer in her maternal uncle. Social history  She reports that she has never smoked. She has never used smokeless tobacco. She reports that she does not drink alcohol and does not use drugs.  MEDICARE WELLNESS OBJECTIVES: Physical activity:   Cardiac risk factors:   Depression/mood screen:   Depression screen [facility] 2/9 04/20/2020  Decreased Interest 0  Down, Depressed, Hopeless 0  PHQ - 2 Score 0  Altered sleeping 0  Tired, decreased energy 0  Change in appetite 0  Feeling bad or failure about yourself  0  Trouble concentrating 0  Moving slowly or fidgety/restless 0  Suicidal thoughts 0  PHQ-9 Score 0  Difficult doing work/chores Not difficult at all    ADLs:  In your present state of health, do you have any difficulty performing the following activities: 04/20/2020 01/15/2020  Hearing? N N  Vision? N N  Difficulty concentrating or making decisions? N N  Walking or  climbing stairs? N N  Dressing or bathing? N N  Doing errands, shopping? N N  Some recent data might be hidden     Cognitive Testing  Alert? Yes  Normal Appearance?Yes  Oriented to person? Yes  Place? Yes   Time? Yes  Recall of three objects?  Yes  Can perform simple calculations? Yes  Displays appropriate judgment?Yes  Can read the correct time from a watch face?Yes  EOL planning:     Objective:   There were no vitals filed for this visit.  There is no height or weight on file to calculate BMI.  General appearance: alert, no distress, WD/WN, female HEENT: normocephalic, sclerae anicteric, TMs pearly, nares patent, no discharge or erythema, pharynx normal Oral cavity: MMM, no lesions Neck: supple, no lymphadenopathy, no thyromegaly, no masses Heart: RRR, normal S1, S2, no murmurs Lungs: CTA bilaterally, no wheezes, rhonchi, or rales Abdomen: +bs, soft, non tender, non distended, no masses, no hepatomegaly, no splenomegaly Musculoskeletal: Cast present on the right ankle, nontender, no  swelling, no obvious deformity Extremities: no edema, no cyanosis, no clubbing Pulses: 2+ symmetric, upper and lower extremities, normal cap refill Neurological: alert, oriented x 3, CN2-12 intact, strength normal upper extremities and lower extremities, sensation normal throughout, DTRs 2+ throughout, no cerebellar signs, gait normal Psychiatric: normal affect, behavior normal, pleasant   Medicare Attestation I have personally reviewed: The patient's medical and social history Their use of alcohol, tobacco or illicit drugs Their current medications and supplements The patient's functional ability including ADLs,fall risks, home safety risks, cognitive, and hearing and visual impairment Diet and physical activities Evidence for depression or mood disorders  The patient's weight, height, BMI, and visual acuity have been recorded in the chart.  I have made referrals, counseling, and provided  education to the patient based on review of the above and I have provided the patient with a written personalized care plan for preventive services.     Izora Ribas, NP   12/17/2020

## 2021-01-19 ENCOUNTER — Other Ambulatory Visit: Payer: Self-pay | Admitting: Internal Medicine

## 2021-01-19 ENCOUNTER — Other Ambulatory Visit: Payer: Self-pay | Admitting: Adult Health

## 2021-01-19 DIAGNOSIS — I10 Essential (primary) hypertension: Secondary | ICD-10-CM

## 2021-01-19 DIAGNOSIS — K219 Gastro-esophageal reflux disease without esophagitis: Secondary | ICD-10-CM

## 2021-01-19 DIAGNOSIS — F325 Major depressive disorder, single episode, in full remission: Secondary | ICD-10-CM

## 2021-01-19 DIAGNOSIS — R42 Dizziness and giddiness: Secondary | ICD-10-CM

## 2021-01-21 ENCOUNTER — Encounter: Payer: Self-pay | Admitting: Internal Medicine

## 2021-01-21 NOTE — Progress Notes (Signed)
Annual Screening/Preventative Visit & Comprehensive Evaluation &  Examination  Future Appointments  Date Time Provider [place]  01/22/2021    - CPE  10:00 AM Unk Pinto, MD [facility] None  04/20/2021   - Wellness 11:00 AM Liane Comber, NP [facility] None  01/26/2022    - CPE 10:00 AM Unk Pinto, MD [facility] None        This very nice 80 y.o. WWF presents for a Screening /Preventative Visit & comprehensive evaluation and management of multiple medical co-morbidities.  Patient has been followed for HTN, HLD, Prediabetes  and Vitamin D Deficiency. Patient's GERD is stable /controlled on her meds. Patien is on Gabapentin 100 mg for peripheral neuropathy & feels the 100 mg is not controlling her pain.        HTN predates since 1999.  Negative Cardiolite in 2011. Patient's BP has been controlled at home and patient denies any cardiac symptoms as chest pain, palpitations, shortness of breath, dizziness or ankle swelling. Today's BP is at goal - 116/62.        Patient's hyperlipidemia is controlled with diet and Atorvastatin. Patient denies myalgias or other medication SE's. Last lipids were at goal:  Lab Results  Component Value Date   CHOL 134 07/27/2020   HDL 44 (L) 07/27/2020   LDLCALC 69 07/27/2020   TRIG 128 07/27/2020   CHOLHDL 3.0 07/27/2020         Patient has hx/o prediabetes (A1c 5.9% / I 90 /2011) and patient denies reactive hypoglycemic symptoms, visual blurring, diabetic polys or paresthesias. Last A1c was not at goal:  Lab Results  Component Value Date   HGBA1C 5.9 (H) 07/27/2020         Finally, patient has history of Vitamin D Deficiency ("31" /2008) and last Vitamin D was  near goal  ( 70-100):  Lab Results  Component Value Date   VD25OH 58 07/27/2020     Current Outpatient Medications on File Prior to Visit  Medication Sig   amLODipine 2.5 MG tablet Take  1 tablet  Daily  for BP    aspirin EC 81 MG tablet Take 81 mg by mouth  daily.   atorvastatin (LIPITOR) 40 MG tablet Take 1 tablet Daily for Cholesterol   bisoprolol-hctz 2.5-6.25 MG tablet Take  1 tablet  Daily  for BP   Vitamin D 5,000 Units  Take in the morning, at noon  and at bedtime.    gabapentin (NEURONTIN) 100 MG  TAKE 1 TO 3 CAPSULES TWO TO THREE TIMES DAILY AS NEEDED FOR NEUROPATHY PAINS   meclizine (ANTIVERT) 25 MG tablet Take  1 tablet  2 to 3 x /day  if needed for Vertigo    meloxicam (MOBIC) 15 MG tablet Take 1/2 to 1 tablet daily    olmesartan (BENICAR) 40 MG tablet Take  1 tablet  Every Night for BP    pantoprazole  40 MG tablet Take  1 tablet  Daily  to Prevent Indigestion & Heartburn    PARoxetine (PAXIL) 30 MG tablet Take  1 tablet  Daily  for Mood     Allergies  Allergen Reactions   Aspirin Other (See Comments)    High Doses stomach trouble   Codeine Nausea And Vomiting   Fenofibrate Cough   Hydrochlorothiazide Other (See Comments)    Fatigue   Metoclopramide Other (See Comments)    Sedation     Past Medical History:  Diagnosis Date   Arthritis    Colon cancer ([place])  1990s, 2014   Colon polyps    Depression    Dysrhythmia 04/2012   SVT - RATE 145 POST OP CRANIOTOMY SURGERY --TX'D AND RESOLVED--PT STATES NO FURTHER PROBLEM THAT SHE IS AWARE OF -SHE HAS NOT HAD TO SEE CARDIOLOGIST   GERD (gastroesophageal reflux disease)    Headache(784.0)    hx migraines   Hypertension    pt states she has not taken any b/p medication since her brain surgery in oct 3013--took herself off the medication   Pituitary mass ([place]) NOV 2013   MASS FOUND AFTER PT EXPERIENCED BILATERAL BLURRED / HAZY VISION -ESP LEFT EYE.  S/P CRANIOTOMY AND EYESIGHT RETURNED TO NORMAL--PT STILL HAS A LOT OF TENDERNESS RIGHT SIDE OF NOSE --THE CRANIOTOMY WAS DONE BY TRANSNASAL APPROACH   Pneumonia yrs ago   hx of pneumonia   PONV (postoperative nausea and vomiting)    PT STATES PLEASE NOTE SHE HAS TENDERNESS RIGHT SIDE OF NOSE - SINCE TRANSNASAL CRANIOTOMY 04/2012    Prediabetes    Vitamin D deficiency      Health Maintenance  Topic Date Due   COVID-19 Vaccine (1) Never done   Zoster Vaccines- Shingrix (1 of 2) Never done   INFLUENZA VACCINE  01/04/2021   TETANUS/TDAP  11/30/2025   DEXA SCAN  Completed   PNA vac Low Risk Adult  Completed   HPV VACCINES  Aged Out     Immunization History  Administered Date(s) Administered   Influenza Split 04/19/2012   Influenza, High Dose Seasonal PF 04/17/2013, 03/12/2014, 03/03/2017, 04/12/2018, 04/17/2019, 04/20/2020   Pneumococcal Conjugate-13 09/30/2014   Pneumococcal Polysaccharide-23 04/19/2012   Td 07/20/2005, 12/01/2015     Last Colon - 07/03/2018 - Dr Paulita Fujita - recc no F/U due to age     Last MGM - 10/2013 - patient was encouraged to schedule MGM  - last 2 year   Past Surgical History:  Procedure Laterality Date   BACK SURGERY     lower   CATARACT EXTRACTION  June/july 2013   bilateral eyes   COLON RESECTION  1988   COLON SURGERY  1990's   COLONOSCOPY  09/20/2011   Procedure: COLONOSCOPY;  Surgeon: Missy Sabins, MD;  Location: [facility];  Service: Endoscopy;  Laterality: N/A;   COLONOSCOPY  12/15/2011   Procedure: COLONOSCOPY;  Surgeon: Missy Sabins, MD;  Location: [facility];  Service: Endoscopy;  Laterality: N/A;   COLONOSCOPY  01/26/2012   Procedure: COLONOSCOPY;  Surgeon: Cleotis Nipper, MD;  Location: [facility];  Service: Endoscopy;  Laterality: N/A;   CRANIOTOMY  04/18/2012   Procedure: CRANIOTOMY HYPOPHYSECTOMY TRANSNASAL APPROACH;  Surgeon: Ophelia Charter, MD;  Location: [facility];  Service: Neurosurgery;  Laterality: Bilateral;  Transphenoidal resection of Pituitary tumor.    CYSTOSCOPY WITH URETEROSCOPY AND STENT PLACEMENT Bilateral 10/01/2015   Procedure: CYSTOSCOPY, retrograde WITH URETEROSCOPY AND STENT PLACEMENT, ;  Surgeon: Raynelle Bring, MD;  Location: [facility];  Service: Urology;  Laterality: Bilateral;   CYSTOSCOPY WITH URETEROSCOPY AND STENT PLACEMENT Right  10/19/2015   Procedure: CYSTOSCOPY,  RIGHT URETEROSCOPY, HOLMIUM LASER RIGHT URETERAL STONE, RIGHT URETERAL STENT PLACEMENT, LEFT URETEROSCOPY, INSERTION LEFT URETERAL STENT;  Surgeon: Raynelle Bring, MD;  Location: [facility];  Service: Urology;  Laterality: Right;   EYE SURGERY     cataract bil   HOLMIUM LASER APPLICATION Right [id number]   Procedure: HOLMIUM LASER  ;  Surgeon: Raynelle Bring, MD;  Location: [facility];  Service: Urology;  Laterality: Right;   HOT HEMOSTASIS  09/20/2011   Procedure: HOT HEMOSTASIS (ARGON PLASMA COAGULATION/BICAP);  Surgeon: Missy Sabins, MD;  Location: [facility];  Service: Endoscopy;  Laterality: N/A;   LAPAROSCOPIC RIGHT HEMI COLECTOMY  06/27/2012   Procedure: LAPAROSCOPIC RIGHT HEMI COLECTOMY;  Surgeon: Pedro Earls, MD;  Location: [facility];  Service: General;  Laterality: N/A;  Laparoscopic Assited Right Hemicolectomy   TRANSNASAL APPROACH  04/18/2012   Procedure: TRANSNASAL APPROACH;  Surgeon: Rozetta Nunnery, MD;  Location: [facility];  Service: ENT;  Laterality: Bilateral;  Transnasal approach   TUMOR REMOVAL     Left leg     Family History  Problem Relation Age of Onset   Colon cancer Sister 75   Heart disease Mother    Heart disease Father    Cancer Maternal Aunt        unknown form of cancer dx in her 20s-50s   Liver cancer Maternal Uncle        heavy ETOH user   Breast cancer Sister 55   Dementia Sister    Colon cancer Cousin    Colon polyps Son    Colon polyps Son    Colon cancer Other 58   Colon polyps Other    Brain cancer Cousin        dx in his 79s; paternal cousin     Social History   Tobacco Use   Smoking status: Never   Smokeless tobacco: Never  Vaping Use   Vaping Use: Never used  Substance Use Topics   Alcohol use: No    Comment: a couple times a year, one drink at a time   Drug use: No      ROS Constitutional: Denies fever, chills, weight loss/gain, headaches, insomnia,  night sweats, and change in appetite.  Does c/o fatigue. Eyes: Denies redness, blurred vision, diplopia, discharge, itchy, watery eyes.  ENT: Denies discharge, congestion, post nasal drip, epistaxis, sore throat, earache, hearing loss, dental pain, Tinnitus, Vertigo, Sinus pain, snoring.  Cardio: Denies chest pain, palpitations, irregular heartbeat, syncope, dyspnea, diaphoresis, orthopnea, PND, claudication, edema Respiratory: denies cough, dyspnea, DOE, pleurisy, hoarseness, laryngitis, wheezing.  Gastrointestinal: Denies dysphagia, heartburn, reflux, water brash, pain, cramps, nausea, vomiting, bloating, diarrhea, constipation, hematemesis, melena, hematochezia, jaundice, hemorrhoids Genitourinary: Denies dysuria, frequency, urgency, nocturia, hesitancy, discharge, hematuria, flank pain Breast: Breast lumps, nipple discharge, bleeding.  Musculoskeletal: Denies arthralgia, myalgia, stiffness, Jt. Swelling, pain, limp, and strain/sprain. Denies falls. Skin: Denies puritis, rash, hives, warts, acne, eczema, changing in skin lesion Neuro: No weakness, tremor, incoordination, spasms, paresthesia, pain Psychiatric: Denies confusion, memory loss, sensory loss. Denies Depression. Endocrine: Denies change in weight, skin, hair change, nocturia, and paresthesia, diabetic polys, visual blurring, hyper / hypo glycemic episodes.  Heme/Lymph: No excessive bleeding, bruising, enlarged lymph nodes.  Physical Exam  BP 116/62   Pulse (!) 46   Temp (!) 97.5 F (36.4 C)   Resp 16   Ht 5' 4.5" (1.638 m)   Wt 151 lb 9.6 oz (68.8 kg)   SpO2 99%   BMI 25.62 kg/m   General Appearance: Well nourished, well groomed and in no apparent distress.  Eyes: PERRLA, EOMs, conjunctiva no swelling or erythema, normal fundi and vessels. Sinuses: No frontal/maxillary tenderness ENT/Mouth: EACs patent / TMs  nl. Nares clear without erythema, swelling, mucoid exudates. Oral hygiene is good. No erythema, swelling, or exudate. Tongue normal, non-obstructing.  Tonsils not swollen or erythematous. Hearing normal.  Neck: Supple, thyroid not palpable. No bruits, nodes or JVD. Respiratory: Respiratory effort normal.  BS equal and clear bilateral without rales, rhonci, wheezing or stridor. Cardio: Heart sounds are normal with regular rate and rhythm and no murmurs, rubs or gallops. Peripheral pulses are normal and equal bilaterally without edema. No aortic or femoral bruits. Chest: symmetric with normal excursions and percussion. Breasts: Symmetric, without lumps, nipple discharge, retractions, or fibrocystic changes.  Abdomen: Flat, soft with bowel sounds active. Nontender, no guarding, rebound, hernias, masses, or organomegaly.  Lymphatics: Non tender without lymphadenopathy.  Genitourinary:  Musculoskeletal: Full ROM all peripheral extremities, joint stability, 5/5 strength, and normal gait. Skin: Warm and dry without rashes, lesions, cyanosis, clubbing or  ecchymosis.  Neuro: Cranial nerves intact, reflexes equal bilaterally. Normal muscle tone, no cerebellar symptoms. Sensation intact.  Pysch: Alert and oriented X 3, normal affect, Insight and Judgment appropriate.    Assessment and Plan  1. Annual Preventative Screening Examination   2. Essential hypertension  - EKG 12-Lead - Urinalysis, Routine w reflex microscopic - Microalbumin / creatinine urine ratio - CBC with Differential/Platelet - COMPLETE METABOLIC PANEL WITH GFR - Magnesium - TSH  3. Hyperlipidemia, mixed  - EKG 12-Lead - Lipid panel - TSH  4. Abnormal glucose  - EKG 12-Lead - Hemoglobin A1c - Insulin, random  5. Vitamin D deficiency  - VITAMIN D 25 Hydroxy   6. Gastroesophageal reflux disease  - CBC with Differential/Platelet  7. Screening for colorectal cancer  - POC Hemoccult Bld/Stl   8. History of colon cancer, stage II  - POC Hemoccult Bld/Stl  9. Screening for ischemic heart disease  - EKG 12-Lead  10. FHx: heart disease  - EKG  12-Lead  11. Medication management  - Urinalysis, Routine w reflex microscopic - Microalbumin / creatinine urine ratio - TSH - Hemoglobin A1c - Insulin, random - VITAMIN D 25 Hydroxy           Patient was counseled in prudent diet to achieve/maintain BMI less than 25 for weight control, BP monitoring, regular exercise and medications. Discussed med's effects and SE's. Screening labs and tests as requested with regular follow-up as recommended. Over 40 minutes of exam, counseling, chart review and high complex critical decision making was performed.   Kirtland Bouchard, MD

## 2021-01-22 ENCOUNTER — Other Ambulatory Visit: Payer: Self-pay

## 2021-01-22 ENCOUNTER — Ambulatory Visit (INDEPENDENT_AMBULATORY_CARE_PROVIDER_SITE_OTHER): Payer: PPO | Admitting: Internal Medicine

## 2021-01-22 VITALS — BP 116/62 | HR 46 | Temp 97.5°F | Resp 16 | Ht 64.5 in | Wt 151.6 lb

## 2021-01-22 DIAGNOSIS — R7309 Other abnormal glucose: Secondary | ICD-10-CM | POA: Diagnosis not present

## 2021-01-22 DIAGNOSIS — Z0000 Encounter for general adult medical examination without abnormal findings: Secondary | ICD-10-CM

## 2021-01-22 DIAGNOSIS — E559 Vitamin D deficiency, unspecified: Secondary | ICD-10-CM | POA: Diagnosis not present

## 2021-01-22 DIAGNOSIS — Z1211 Encounter for screening for malignant neoplasm of colon: Secondary | ICD-10-CM

## 2021-01-22 DIAGNOSIS — E782 Mixed hyperlipidemia: Secondary | ICD-10-CM | POA: Diagnosis not present

## 2021-01-22 DIAGNOSIS — G629 Polyneuropathy, unspecified: Secondary | ICD-10-CM

## 2021-01-22 DIAGNOSIS — Z79899 Other long term (current) drug therapy: Secondary | ICD-10-CM

## 2021-01-22 DIAGNOSIS — Z0001 Encounter for general adult medical examination with abnormal findings: Secondary | ICD-10-CM

## 2021-01-22 DIAGNOSIS — I10 Essential (primary) hypertension: Secondary | ICD-10-CM

## 2021-01-22 DIAGNOSIS — K219 Gastro-esophageal reflux disease without esophagitis: Secondary | ICD-10-CM

## 2021-01-22 DIAGNOSIS — Z8249 Family history of ischemic heart disease and other diseases of the circulatory system: Secondary | ICD-10-CM

## 2021-01-22 DIAGNOSIS — Z85038 Personal history of other malignant neoplasm of large intestine: Secondary | ICD-10-CM

## 2021-01-22 DIAGNOSIS — Z136 Encounter for screening for cardiovascular disorders: Secondary | ICD-10-CM

## 2021-01-22 NOTE — Patient Instructions (Signed)
Due to recent changes in healthcare laws, you Dudash see the results of your imaging and laboratory studies on MyChart before your provider has had a chance to review them.  We understand that in some cases there Gin be results that are confusing or concerning to you. Not all laboratory results come back in the same time frame and the provider Hoback be waiting for multiple results in order to interpret others.  Please give [place] 48 hours in order for your provider to thoroughly review all the results before contacting the office for clarification of your results.   +++++++++++++++++++++++++  Vit D  & Vit C 1,000 mg   are recommended to help protect  against the Covid-19 and other Corona viruses.    Also it's recommended  to take  Zinc 50 mg  to help  protect against the Covid-19   and best place to get  is also on [email or web address]  and don't pay more than 6-8 cents /pill !  ================================ Coronavirus (COVID-19) Are you at risk?  Are you at risk for the Coronavirus (COVID-19)?  To be considered HIGH RISK for Coronavirus (COVID-19), you have to meet the following criteria:  Traveled to [place], [place], [place], [place] or [place]; or in the [place] to [place], [place], [place]  or [place]; and have fever, cough, and shortness of breath within the last 2 weeks of travel OR Been in close contact with a person diagnosed with COVID-19 within the last 2 weeks and have  fever, cough,and shortness of breath  IF YOU DO NOT MEET THESE CRITERIA, YOU ARE CONSIDERED LOW RISK FOR COVID-19.  What to do if you are HIGH RISK for COVID-19?  If you are having a medical emergency, call 911. Seek medical care right away. Before you go to a doctor's office, urgent care or emergency department,  call ahead and tell them about your recent travel, contact with someone diagnosed with COVID-19   and your symptoms.  You should receive instructions from your physician's office regarding next  steps of care.  When you arrive at healthcare provider, tell the healthcare staff immediately you have returned from  visiting [place], [place], [place], [place] or [place]; or traveled in the [place] to [place], [place],  [place] or [place] in the last two weeks or you have been in close contact with a person diagnosed with  COVID-19 in the last 2 weeks.   Tell the health care staff about your symptoms: fever, cough and shortness of breath. After you have been seen by a medical provider, you will be either: Tested for (COVID-19) and discharged home on quarantine except to seek medical care if  symptoms worsen, and asked to  Stay home and avoid contact with others until you get your results (4-5 days)  Avoid travel on public transportation if possible (such as bus, train, or airplane) or Sent to the Emergency Department by EMS for evaluation, COVID-19 testing  and  possible admission depending on your condition and test results.  What to do if you are LOW RISK for COVID-19?  Reduce your risk of any infection by using the same precautions used for avoiding the common cold or flu:  Wash your hands often with soap and warm water for at least 20 seconds.  If soap and water are not readily available,  use an alcohol-based hand sanitizer with at least 60% alcohol.  If coughing or sneezing, cover your mouth and nose by coughing  or sneezing into the elbow areas of your shirt or coat,  into a tissue or into your sleeve (not your hands). Avoid shaking hands with others and consider head nods or verbal greetings only. Avoid touching your eyes, nose, or mouth with unwashed hands.  Avoid close contact with people who are sick. Avoid places or events with large numbers of people in one location, like concerts or sporting events. Carefully consider travel plans you have or are making. If you are planning any travel outside or inside the [place], visit the CDC's Travelers' Health webpage for the  latest health notices. If you have some symptoms but not all symptoms, continue to monitor at home and seek medical attention  if your symptoms worsen. If you are having a medical emergency, call 911.   >>>>>>>>>>>>>>>>>>>>>>>>>>>>>>>>>  We Do NOT Approve of [employer] > > > > > > > > > > > > > > > > > > > > > > > > > > > > > > > > > > > > > > >  Preventive Care for Adults  A healthy lifestyle and preventive care can promote health and wellness. Preventive health guidelines for women include the following key practices. A routine yearly physical is a good way to check with your health care provider about your health and preventive screening. It is a chance to share any concerns and updates on your health and to receive a thorough exam. Visit your dentist for a routine exam and preventive care every 6 months. Brush your teeth twice a day and floss once a day. Good oral hygiene prevents tooth decay and gum disease. The frequency of eye exams is based on your age, health, family medical history, use of contact lenses, and other factors. Follow your health care provider's recommendations for frequency of eye exams. Eat a healthy diet. Foods like vegetables, fruits, whole grains, low-fat dairy products, and lean protein foods contain the nutrients you need without too many calories. Decrease your intake of foods high in solid fats, added sugars, and salt. Eat the right amount of calories for you. Get information about a proper diet from your health care provider, if necessary. Regular physical exercise is one of the most important things you can do for your health. Most adults should get at least 150 minutes of moderate-intensity exercise (any activity that increases your heart rate and causes you to sweat) each week. In addition, most adults need muscle-strengthening exercises on 2 or more days a week. Maintain a healthy weight. The body mass index (BMI) is a screening tool to identify  possible weight problems. It provides an estimate of body fat based on height and weight. Your health care provider can find your BMI and can help you achieve or maintain a healthy weight. For adults 20 years and older: A BMI below 18.5 is considered underweight. A BMI of 18.5 to 24.9 is normal. A BMI of 25 to 29.9 is considered overweight. A BMI of 30 and above is considered obese. Maintain normal blood lipids and cholesterol levels by exercising and minimizing your intake of saturated fat. Eat a balanced diet with plenty of fruit and vegetables. If your lipid or cholesterol levels are high, you are over 50, or you are at high risk for heart disease, you may need your cholesterol levels checked more frequently. Ongoing high lipid and cholesterol levels should be treated with medicines if diet and exercise are not working. If you smoke, find out from  your health care provider how to quit. If you do not use tobacco, do not start. Lung cancer screening is recommended for adults aged 55-80 years who are at high risk for developing lung cancer because of a history of smoking. A yearly low-dose CT scan of the lungs is recommended for people who have at least a 30-pack-year history of smoking and are a current smoker or have quit within the past 15 years. A pack year of smoking is smoking an average of 1 pack of cigarettes a day for 1 year (for example: 1 pack a day for 30 years or 2 packs a day for 15 years). Yearly screening should continue until the smoker has stopped smoking for at least 15 years. Yearly screening should be stopped for people who develop a health problem that would prevent them from having lung cancer treatment. Avoid use of street drugs. Do not share needles with anyone. Ask for help if you need support or instructions about stopping the use of drugs. High blood pressure causes heart disease and increases the risk of stroke.  Ongoing high blood pressure should be treated with medicines if  weight loss and exercise do not work. If you are 55-79 years old, ask your health care provider if you should take aspirin to prevent strokes. Diabetes screening involves taking a blood sample to check your fasting blood sugar level. This should be done once every 3 years, after age 45, if you are within normal weight and without risk factors for diabetes. Testing should be considered at a younger age or be carried out more frequently if you are overweight and have at least 1 risk factor for diabetes. Breast cancer screening is essential preventive care for women. You should practice "breast self-awareness." This means understanding the normal appearance and feel of your breasts and may include breast self-examination. Any changes detected, no matter how small, should be reported to a health care provider. Women in their 20s and 30s should have a clinical breast exam (CBE) by a health care provider as part of a regular health exam every 1 to 3 years. After age 40, women should have a CBE every year. Starting at age 40, women should consider having a mammogram (breast X-ray test) every year. Women who have a family history of breast cancer should talk to their health care provider about genetic screening. Women at a high risk of breast cancer should talk to their health care providers about having an MRI and a mammogram every year. Breast cancer gene (BRCA)-related cancer risk assessment is recommended for women who have family members with BRCA-related cancers. BRCA-related cancers include breast, ovarian, tubal, and peritoneal cancers. Having family members with these cancers may be associated with an increased risk for harmful changes (mutations) in the breast cancer genes BRCA1 and BRCA2. Results of the assessment will determine the need for genetic counseling and BRCA1 and BRCA2 testing. Routine pelvic exams to screen for cancer are no longer recommended for nonpregnant women who are considered low risk for  cancer of the pelvic organs (ovaries, uterus, and vagina) and who do not have symptoms. Ask your health care provider if a screening pelvic exam is right for you. If you have had past treatment for cervical cancer or a condition that could lead to cancer, you need Pap tests and screening for cancer for at least 20 years after your treatment. If Pap tests have been discontinued, your risk factors (such as having a new sexual partner) need to be   reassessed to determine if screening should be resumed. Some women have medical problems that increase the chance of getting cervical cancer. In these cases, your health care provider may recommend more frequent screening and Pap tests.  Colorectal cancer can be detected and often prevented. Most routine colorectal cancer screening begins at the age of 9 years and continues through age 47 years. However, your health care provider may recommend screening at an earlier age if you have risk factors for colon cancer. On a yearly basis, your health care provider may provide home test kits to check for hidden blood in the stool. Use of a small camera at the end of a tube, to directly examine the colon (sigmoidoscopy or colonoscopy), can detect the earliest forms of colorectal cancer. Talk to your health care provider about this at age 66, when routine screening begins.  Direct exam of the colon should be repeated every 5-10 years through age 98 years, unless early forms of pre-cancerous polyps or small growths are found. Osteoporosis is a disease in which the bones lose minerals and strength with aging. This can result in serious bone fractures or breaks. The risk of osteoporosis can be identified using a bone density scan. Women ages 48 years and over and women at risk for fractures or osteoporosis should discuss screening with their health care providers. Ask your health care provider whether you should take a calcium supplement or vitamin D to reduce the rate of  osteoporosis. Menopause can be associated with physical symptoms and risks. Hormone replacement therapy is available to decrease symptoms and risks. You should talk to your health care provider about whether hormone replacement therapy is right for you. Use sunscreen. Apply sunscreen liberally and repeatedly throughout the day. You should seek shade when your shadow is shorter than you. Protect yourself by wearing long sleeves, pants, a wide-brimmed hat, and sunglasses year round, whenever you are outdoors. Once a month, do a whole body skin exam, using a mirror to look at the skin on your back. Tell your health care provider of new moles, moles that have irregular borders, moles that are larger than a pencil eraser, or moles that have changed in shape or color. Stay current with required vaccines (immunizations). Influenza vaccine. All adults should be immunized every year. Tetanus, diphtheria, and acellular pertussis (Td, Tdap) vaccine. Pregnant women should receive 1 dose of Tdap vaccine during each pregnancy. The dose should be obtained regardless of the length of time since the last dose. Immunization is preferred during the 27th-36th week of gestation. An adult who has not previously received Tdap or who does not know her vaccine status should receive 1 dose of Tdap. This initial dose should be followed by tetanus and diphtheria toxoids (Td) booster doses every 10 years. Adults with an unknown or incomplete history of completing a 3-dose immunization series with Td-containing vaccines should begin or complete a primary immunization series including a Tdap dose. Adults should receive a Td booster every 10 years.  Zoster vaccine. One dose is recommended for adults aged 47 years or older unless certain conditions are present.  Pneumococcal 13-valent conjugate (PCV13) vaccine. When indicated, a person who is uncertain of her immunization history and has no record of immunization should receive the PCV13  vaccine. An adult aged 63 years or older who has certain medical conditions and has not been previously immunized should receive 1 dose of PCV13 vaccine. This PCV13 should be followed with a dose of pneumococcal polysaccharide (PPSV23) vaccine. The PPSV23  vaccine dose should be obtained at least 1 or more year(s) after the dose of PCV13 vaccine. An adult aged 19 years or older who has certain medical conditions and previously received 1 or more doses of PPSV23 vaccine should receive 1 dose of PCV13. The PCV13 vaccine dose should be obtained 1 or more years after the last PPSV23 vaccine dose.  Pneumococcal polysaccharide (PPSV23) vaccine. When PCV13 is also indicated, PCV13 should be obtained first. All adults aged 65 years and older should be immunized. An adult younger than age 65 years who has certain medical conditions should be immunized. Any person who resides in a nursing home or long-term care facility should be immunized. An adult smoker should be immunized. People with an immunocompromised condition and certain other conditions should receive both PCV13 and PPSV23 vaccines. People with human immunodeficiency virus (HIV) infection should be immunized as soon as possible after diagnosis. Immunization during chemotherapy or radiation therapy should be avoided. Routine use of PPSV23 vaccine is not recommended for American Indians, [place] Natives, or people younger than 65 years unless there are medical conditions that require PPSV23 vaccine. When indicated, people who have unknown immunization and have no record of immunization should receive PPSV23 vaccine. One-time revaccination 5 years after the first dose of PPSV23 is recommended for people aged 19-64 years who have chronic kidney failure, nephrotic syndrome, asplenia, or immunocompromised conditions. People who received 1-2 doses of PPSV23 before age 65 years should receive another dose of PPSV23 vaccine at age 65 years or later if at least 5 years have  passed since the previous dose. Doses of PPSV23 are not needed for people immunized with PPSV23 at or after age 65 years.  Preventive Services / Frequency  Ages 65 years and over Blood pressure check. Lipid and cholesterol check. Lung cancer screening. / Every year if you are aged 55-80 years and have a 30-pack-year history of smoking and currently smoke or have quit within the past 15 years. Yearly screening is stopped once you have quit smoking for at least 15 years or develop a health problem that would prevent you from having lung cancer treatment. Clinical breast exam.** / Every year after age 40 years.  BRCA-related cancer risk assessment.** / For women who have family members with a BRCA-related cancer (breast, ovarian, tubal, or peritoneal cancers). Mammogram.** / Every year beginning at age 40 years and continuing for as long as you are in good health. Consult with your health care provider. Pap test.** / Every 3 years starting at age 30 years through age 65 or 70 years with 3 consecutive normal Pap tests. Testing can be stopped between 65 and 70 years with 3 consecutive normal Pap tests and no abnormal Pap or HPV tests in the past 10 years. Fecal occult blood test (FOBT) of stool. / Every year beginning at age 50 years and continuing until age 75 years. You may not need to do this test if you get a colonoscopy every 10 years. Flexible sigmoidoscopy or colonoscopy.** / Every 5 years for a flexible sigmoidoscopy or every 10 years for a colonoscopy beginning at age 50 years and continuing until age 75 years. Hepatitis C blood test.** / For all people born from 1945 through 1965 and any individual with known risks for hepatitis C. Osteoporosis screening.** / A one-time screening for women ages 65 years and over and women at risk for fractures or osteoporosis. Skin self-exam. / Monthly. Influenza vaccine. / Every year. Tetanus, diphtheria, and acellular pertussis (Tdap/Td) vaccine.** /   1 dose  of Td every 10 years. Zoster vaccine.** / 1 dose for adults aged 60 years or older. Pneumococcal 13-valent conjugate (PCV13) vaccine.** / Consult your health care provider. Pneumococcal polysaccharide (PPSV23) vaccine.** / 1 dose for all adults aged 65 years and older. Screening for abdominal aortic aneurysm (AAA)  by ultrasound is recommended for people who have history of high blood pressure or who are current or former smokers. ++++++++++++++++++++ Recommend Adult Low Dose Aspirin or  coated  Aspirin 81 mg daily  To reduce risk of Colon Cancer 40 %,  Skin Cancer 26 % ,  Melanoma 46%  and  Pancreatic cancer 60% ++++++++++++++++++++ Vitamin D goal  is between 70-100.  Please make sure that you are taking your Vitamin D as directed.  It is very important as a natural anti-inflammatory  helping hair, skin, and nails, as well as reducing stroke and heart attack risk.  It helps your bones and helps with mood. It also decreases numerous cancer risks so please take it as directed.  Low Vit D is associated with a 200-300% higher risk for CANCER  and 200-300% higher risk for HEART   ATTACK  &  STROKE.   ...................................... It is also associated with higher death rate at younger ages,  autoimmune diseases like Rheumatoid arthritis, Lupus, Multiple Sclerosis.    Also many other serious conditions, like depression, Alzheimer's Dementia, infertility, muscle aches, fatigue, fibromyalgia - just to name a few. ++++++++++++++++++ Recommend the book "The END of DIETING" by Dr Joel Fuhrman  & the book "The END of DIABETES " by Dr Joel Fuhrman At [employer] - get book & Audio CD's    Being diabetic has a  300% increased risk for heart attack, stroke, cancer, and alzheimer- type vascular dementia. It is very important that you work harder with diet by avoiding all foods that are white. Avoid white rice (Jaroszewski & wild rice is OK), white potatoes (sweetpotatoes in moderation is OK),  White bread or wheat bread or anything made out of white flour like bagels, donuts, rolls, buns, biscuits, cakes, pastries, cookies, pizza crust, and pasta (made from white flour & egg whites) - vegetarian pasta or spinach or wheat pasta is OK. Multigrain breads like Arnold's or Pepperidge Farm, or multigrain sandwich thins or flatbreads.  Diet, exercise and weight loss can reverse and cure diabetes in the early stages.  Diet, exercise and weight loss is very important in the control and prevention of complications of diabetes which affects every system in your body, ie. Brain - dementia/stroke, eyes - glaucoma/blindness, heart - heart attack/heart failure, kidneys - dialysis, stomach - gastric paralysis, intestines - malabsorption, nerves - severe painful neuritis, circulation - gangrene & loss of a leg(s), and finally cancer and Alzheimers.    I recommend avoid fried & greasy foods,  sweets/candy, white rice (Augsburger or wild rice or Quinoa is OK), white potatoes (sweet potatoes are OK) - anything made from white flour - bagels, doughnuts, rolls, buns, biscuits,white and wheat breads, pizza crust and traditional pasta made of white flour & egg white(vegetarian pasta or spinach or wheat pasta is OK).  Multi-grain bread is OK - like multi-grain flat bread or sandwich thins. Avoid alcohol in excess. Exercise is also important.    Eat all the vegetables you want - avoid meat, especially red meat and dairy - especially cheese.  Cheese is the most concentrated form of trans-fats which is the worst thing to clog up our arteries. Veggie cheese is OK   which can be found in the fresh produce section at [employer] or [employer] or [employer]  +++++++++++++++++++ DASH Eating Plan  DASH stands for "Dietary Approaches to Stop Hypertension."   The DASH eating plan is a healthy eating plan that has been shown to reduce high blood pressure (hypertension). Additional health benefits may include reducing the risk of type 2  diabetes mellitus, heart disease, and stroke. The DASH eating plan may also help with weight loss. WHAT DO I NEED TO KNOW ABOUT THE DASH EATING PLAN? For the DASH eating plan, you will follow these general guidelines: Choose foods with a percent daily value for sodium of less than 5% (as listed on the food label). Use salt-free seasonings or herbs instead of table salt or sea salt. Check with your health care provider or pharmacist before using salt substitutes. Eat lower-sodium products, often labeled as "lower sodium" or "no salt added." Eat fresh foods. Eat more vegetables, fruits, and low-fat dairy products. Choose whole grains. Look for the word "whole" as the first word in the ingredient list. Choose fish  Limit sweets, desserts, sugars, and sugary drinks. Choose heart-healthy fats. Eat veggie cheese  Eat more home-cooked food and less restaurant, buffet, and fast food. Limit fried foods. Cook foods using methods other than frying. Limit canned vegetables. If you do use them, rinse them well to decrease the sodium. When eating at a restaurant, ask that your food be prepared with less salt, or no salt if possible.                      WHAT FOODS CAN I EAT? Read Dr Joel Fuhrman's books on The End of Dieting & The End of Diabetes  Grains Whole grain or whole wheat bread. Rosinski rice. Whole grain or whole wheat pasta. Quinoa, bulgur, and whole grain cereals. Low-sodium cereals. Corn or whole wheat flour tortillas. Whole grain cornbread. Whole grain crackers. Low-sodium crackers.  Vegetables Fresh or frozen vegetables (raw, steamed, roasted, or grilled). Low-sodium or reduced-sodium tomato and vegetable juices. Low-sodium or reduced-sodium tomato sauce and paste. Low-sodium or reduced-sodium canned vegetables.   Fruits All fresh, canned (in natural juice), or frozen fruits.  Protein Products  All fish and seafood.  Dried beans, peas, or lentils. Unsalted nuts and seeds. Unsalted  canned beans.  Dairy Low-fat dairy products, such as skim or 1% milk, 2% or reduced-fat cheeses, low-fat ricotta or cottage cheese, or plain low-fat yogurt. Low-sodium or reduced-sodium cheeses.  Fats and Oils Tub margarines without trans fats. Light or reduced-fat mayonnaise and salad dressings (reduced sodium). Avocado. Safflower, olive, or canola oils. Natural peanut or almond butter.  Other Unsalted popcorn and pretzels. The items listed above may not be a complete list of recommended foods or beverages. Contact your dietitian for more options.  +++++++++++++++  WHAT FOODS ARE NOT RECOMMENDED? Grains/ White flour or wheat flour White bread. White pasta. White rice. Refined cornbread. Bagels and croissants. Crackers that contain trans fat.  Vegetables  Creamed or fried vegetables. Vegetables in a . Regular canned vegetables. Regular canned tomato sauce and paste. Regular tomato and vegetable juices.  Fruits Dried fruits. Canned fruit in light or heavy syrup. Fruit juice.  Meat and Other Protein Products Meat in general - RED meat & White meat.  Fatty cuts of meat. Ribs, chicken wings, all processed meats as bacon, sausage, bologna, salami, fatback, hot dogs, bratwurst and packaged luncheon meats.  Dairy Whole or 2% milk, cream, half-and-half, and cream cheese.   Whole-fat or sweetened yogurt. Full-fat cheeses or blue cheese. Non-dairy creamers and whipped toppings. Processed cheese, cheese spreads, or cheese curds.  Condiments Onion and garlic salt, seasoned salt, table salt, and sea salt. Canned and packaged gravies. Worcestershire sauce. Tartar sauce. Barbecue sauce. Teriyaki sauce. Soy sauce, including reduced sodium. Steak sauce. Fish sauce. Oyster sauce. Cocktail sauce. Horseradish. Ketchup and mustard. Meat flavorings and tenderizers. Bouillon cubes. Hot sauce. Tabasco sauce. Marinades. Taco seasonings. Relishes.  Fats and Oils Butter, stick margarine, lard, shortening and  bacon fat. Coconut, palm kernel, or palm oils. Regular salad dressings.  Pickles and olives. Salted popcorn and pretzels.  The items listed above may not be a complete list of foods and beverages to avoid.   

## 2021-01-23 ENCOUNTER — Other Ambulatory Visit: Payer: Self-pay | Admitting: Internal Medicine

## 2021-01-23 DIAGNOSIS — N3000 Acute cystitis without hematuria: Secondary | ICD-10-CM

## 2021-01-23 MED ORDER — NITROFURANTOIN MONOHYD MACRO 100 MG PO CAPS
ORAL_CAPSULE | ORAL | 0 refills | Status: DC
Start: 2021-01-23 — End: 2021-06-08

## 2021-01-23 MED ORDER — GABAPENTIN 600 MG PO TABS: ORAL_TABLET | ORAL | 3 refills | Status: DC

## 2021-01-23 NOTE — Progress Notes (Signed)
============================================================ ============================================================  -    Needs NV betw Sept  12   to   Sept 23  ============================================================ ============================================================  -  Total Chol = 149    &   LDL Chol  = 84    -   Both     Excellent   - Very low risk for Heart Attack  / Stroke ============================================================ ============================================================  -  A1c - better  - down from 5.9% to now 5.7% - Great - Almost back to Normal ============================================================ ============================================================  -  Vitamin D =- 52 -  Still a little Low   (  ideal or goal is between 70-100  )  ============================================================ ============================================================  -  All Else - CBC - Kidneys - Electrolytes - Liver - Magnesium & Thyroid    - all  Normal / OK ============================================================ ============================================================

## 2021-01-25 LAB — URINALYSIS, ROUTINE W REFLEX MICROSCOPIC
Bacteria, UA: NONE SEEN /HPF
Bilirubin Urine: NEGATIVE
Glucose, UA: NEGATIVE
Hgb urine dipstick: NEGATIVE
Ketones, ur: NEGATIVE
Nitrite: NEGATIVE
RBC / HPF: NONE SEEN /HPF (ref 0–2)
Specific Gravity, Urine: 1.018 (ref 1.001–1.035)
pH: 6.5 (ref 5.0–8.0)

## 2021-01-25 LAB — LIPID PANEL
Cholesterol: 149 mg/dL (ref <200)
HDL: 48 mg/dL — ABNORMAL LOW (ref >=50)
LDL Cholesterol (Calc): 84 mg/dL (calc)
Non-HDL Cholesterol (Calc): 101 mg/dL (calc) (ref <130)
Total CHOL/HDL Ratio: 3.1 (calc) (ref <5.0)
Triglycerides: 83 mg/dL (ref <150)

## 2021-01-25 LAB — COMPLETE METABOLIC PANEL WITH GFR
AG Ratio: 2 (calc) (ref 1.0–2.5)
ALT: 12 U/L (ref 6–29)
AST: 17 U/L (ref 10–35)
Albumin: 4.3 g/dL (ref 3.6–5.1)
Alkaline phosphatase (APISO): 64 U/L (ref 37–153)
BUN/Creatinine Ratio: 15 (calc) (ref 6–22)
BUN: 15 mg/dL (ref 7–25)
CO2: 27 mmol/L (ref 20–32)
Calcium: 9.6 mg/dL (ref 8.6–10.4)
Chloride: 106 mmol/L (ref 98–110)
Creat: 1.02 mg/dL — ABNORMAL HIGH (ref 0.60–0.95)
Globulin: 2.2 g/dL (calc) (ref 1.9–3.7)
Glucose, Bld: 98 mg/dL (ref 65–99)
Potassium: 4.3 mmol/L (ref 3.5–5.3)
Sodium: 140 mmol/L (ref 135–146)
Total Bilirubin: 0.7 mg/dL (ref 0.2–1.2)
Total Protein: 6.5 g/dL (ref 6.1–8.1)
eGFR: 56 mL/min/{1.73_m2} — ABNORMAL LOW (ref >=60)

## 2021-01-25 LAB — CBC WITH DIFFERENTIAL/PLATELET
Absolute Monocytes: 504 cells/uL (ref 200–950)
Basophils Absolute: 51 cells/uL (ref 0–200)
Basophils Relative: 0.7 %
Eosinophils Absolute: 234 cells/uL (ref 15–500)
Eosinophils Relative: 3.2 %
HCT: 35 % (ref 35.0–45.0)
Hemoglobin: 11.7 g/dL (ref 11.7–15.5)
Lymphs Abs: 1504 cells/uL (ref 850–3900)
MCH: 30.2 pg (ref 27.0–33.0)
MCHC: 33.4 g/dL (ref 32.0–36.0)
MCV: 90.2 fL (ref 80.0–100.0)
MPV: 10.5 fL (ref 7.5–12.5)
Monocytes Relative: 6.9 %
Neutro Abs: 5008 cells/uL (ref 1500–7800)
Neutrophils Relative %: 68.6 %
Platelets: 273 10*3/uL (ref 140–400)
RBC: 3.88 10*6/uL (ref 3.80–5.10)
RDW: 13.1 % (ref 11.0–15.0)
Total Lymphocyte: 20.6 %
WBC: 7.3 10*3/uL (ref 3.8–10.8)

## 2021-01-25 LAB — INSULIN, RANDOM: Insulin: 3.7 u[IU]/mL

## 2021-01-25 LAB — HEMOGLOBIN A1C
Hgb A1c MFr Bld: 5.7 % of total Hgb — ABNORMAL HIGH (ref <5.7)
Mean Plasma Glucose: 117 mg/dL
eAG (mmol/L): 6.5 mmol/L

## 2021-01-25 LAB — MICROSCOPIC MESSAGE

## 2021-01-25 LAB — VITAMIN D 25 HYDROXY (VIT D DEFICIENCY, FRACTURES): Vit D, 25-Hydroxy: 52 ng/mL (ref 30–100)

## 2021-01-25 LAB — MAGNESIUM: Magnesium: 2.1 mg/dL (ref 1.5–2.5)

## 2021-01-25 LAB — TSH: TSH: 2.3 mIU/L (ref 0.40–4.50)

## 2021-01-25 LAB — MICROALBUMIN / CREATININE URINE RATIO
Creatinine, Urine: 217 mg/dL (ref 20–275)
Microalb Creat Ratio: 29 mcg/mg creat (ref <30)
Microalb, Ur: 6.2 mg/dL

## 2021-02-02 DIAGNOSIS — M9903 Segmental and somatic dysfunction of lumbar region: Secondary | ICD-10-CM | POA: Diagnosis not present

## 2021-02-02 DIAGNOSIS — M546 Pain in thoracic spine: Secondary | ICD-10-CM | POA: Diagnosis not present

## 2021-02-02 DIAGNOSIS — M9901 Segmental and somatic dysfunction of cervical region: Secondary | ICD-10-CM | POA: Diagnosis not present

## 2021-02-02 DIAGNOSIS — M9902 Segmental and somatic dysfunction of thoracic region: Secondary | ICD-10-CM | POA: Diagnosis not present

## 2021-02-02 DIAGNOSIS — M47812 Spondylosis without myelopathy or radiculopathy, cervical region: Secondary | ICD-10-CM | POA: Diagnosis not present

## 2021-02-02 DIAGNOSIS — M47816 Spondylosis without myelopathy or radiculopathy, lumbar region: Secondary | ICD-10-CM | POA: Diagnosis not present

## 2021-02-04 DIAGNOSIS — M47816 Spondylosis without myelopathy or radiculopathy, lumbar region: Secondary | ICD-10-CM | POA: Diagnosis not present

## 2021-02-04 DIAGNOSIS — M9902 Segmental and somatic dysfunction of thoracic region: Secondary | ICD-10-CM | POA: Diagnosis not present

## 2021-02-04 DIAGNOSIS — M9903 Segmental and somatic dysfunction of lumbar region: Secondary | ICD-10-CM | POA: Diagnosis not present

## 2021-02-04 DIAGNOSIS — M546 Pain in thoracic spine: Secondary | ICD-10-CM | POA: Diagnosis not present

## 2021-02-04 DIAGNOSIS — M9901 Segmental and somatic dysfunction of cervical region: Secondary | ICD-10-CM | POA: Diagnosis not present

## 2021-02-04 DIAGNOSIS — M47812 Spondylosis without myelopathy or radiculopathy, cervical region: Secondary | ICD-10-CM | POA: Diagnosis not present

## 2021-02-09 DIAGNOSIS — M9902 Segmental and somatic dysfunction of thoracic region: Secondary | ICD-10-CM | POA: Diagnosis not present

## 2021-02-09 DIAGNOSIS — M47812 Spondylosis without myelopathy or radiculopathy, cervical region: Secondary | ICD-10-CM | POA: Diagnosis not present

## 2021-02-09 DIAGNOSIS — M9903 Segmental and somatic dysfunction of lumbar region: Secondary | ICD-10-CM | POA: Diagnosis not present

## 2021-02-09 DIAGNOSIS — M47816 Spondylosis without myelopathy or radiculopathy, lumbar region: Secondary | ICD-10-CM | POA: Diagnosis not present

## 2021-02-09 DIAGNOSIS — M546 Pain in thoracic spine: Secondary | ICD-10-CM | POA: Diagnosis not present

## 2021-02-09 DIAGNOSIS — M9901 Segmental and somatic dysfunction of cervical region: Secondary | ICD-10-CM | POA: Diagnosis not present

## 2021-02-12 DIAGNOSIS — M9901 Segmental and somatic dysfunction of cervical region: Secondary | ICD-10-CM | POA: Diagnosis not present

## 2021-02-12 DIAGNOSIS — M546 Pain in thoracic spine: Secondary | ICD-10-CM | POA: Diagnosis not present

## 2021-02-12 DIAGNOSIS — M9903 Segmental and somatic dysfunction of lumbar region: Secondary | ICD-10-CM | POA: Diagnosis not present

## 2021-02-12 DIAGNOSIS — M47812 Spondylosis without myelopathy or radiculopathy, cervical region: Secondary | ICD-10-CM | POA: Diagnosis not present

## 2021-02-12 DIAGNOSIS — M9902 Segmental and somatic dysfunction of thoracic region: Secondary | ICD-10-CM | POA: Diagnosis not present

## 2021-02-12 DIAGNOSIS — M47816 Spondylosis without myelopathy or radiculopathy, lumbar region: Secondary | ICD-10-CM | POA: Diagnosis not present

## 2021-04-20 ENCOUNTER — Ambulatory Visit: Payer: PPO | Admitting: Adult Health

## 2021-04-28 NOTE — Progress Notes (Deleted)
MEDICARE ANNUAL WELLNESS VISIT AND FOLLOW UP Assessment:    Encounter for Medicare annual wellness exam Due annually  Health maintenance reviewed  Patient agrees to schedule mammogram ***   SVT (supraventricular tachycardia) (HCC) No known recurrence, continue BB; monitor   Hypertension Will reduce BB due to bradycardia; start ziac 2.5/6.25 mg, monitor BP and pulse Monitor blood pressure at home; call if consistently over 130/80 Continue DASH diet.   Reminder to go to the ER if any CP, SOB, nausea, dizziness, severe HA, changes vision/speech, left arm numbness and tingling and jaw pain.  Gastroesophageal reflux disease, esophagitis presence not specified Well managed on current medications Discussed diet, avoiding triggers and other lifestyle changes  Pituitary tumor (excised 2013) S/p excision, doing well   Vitamin D deficiency Continue supplementation Defer vitamin D level to CPE  Prediabetes Discussed disease and risks Discussed diet/exercise, weight management  A1C q45m; monitor serum glucose, weight   BMI 26 *** Long discussion about weight, diet, and exercise Recommended diet heavy in fruits and veggies and low in animal meats, cheeses, and dairy products, appropriate calorie intake Discussed appropriate weight for height  Follow up at next visit  Medication management CBC, CMP/GFR, magnesium  Major depression in remission ([place]) Continue medications  Lifestyle discussed: diet/exerise, sleep hygiene, stress management, hydration  Hyperlipidemia Continue medications Continue low cholesterol diet and exercise.  Check lipid panel.   History of colon cancer, stage II Colonoscopy 06/2018; has been released  Bradycardia Sinus bradycardia per EKG 01/2021, taking bisoprolol 2.5 mg and monitor, may need to taper off - *** Monitor for fatigue, dizziness  Need for influenza vaccine High dose quadrivalent administered without complication today ***  No orders of  the defined types were placed in this encounter.   Over 30 minutes of exam, counseling, chart review, and critical decision making was performed  Future Appointments  Date Time Provider [place]  05/04/2021 11:30 AM Liane Comber, NP [facility] None  08/05/2021 11:30 AM Unk Pinto, MD [facility] None  01/24/2022 10:00 AM Unk Pinto, MD [facility] None     Plan:   During the course of the visit the patient was educated and counseled about appropriate screening and preventive services including:   Pneumococcal vaccine  Influenza vaccine Prevnar 13 Td vaccine Screening electrocardiogram Colorectal cancer screening Diabetes screening Glaucoma screening Nutrition counseling    Subjective:  Kristen [name] is a 80 y.o. female who presents for Medicare Annual Wellness Visit and 3 month follow up for HTN, hyperlipidemia, prediabetes, and vitamin D Def.   In 2013, patient underwent resection of a pituitary tumor and has done well since. She follows up with ? Neurology, reports last 07/2018, will request reports. ***  She doesn't have car or license, her friend/neighbor is available to drive when needed.   She has a history of colon cancer over 10 years ago, resolved with resection, had recent colonoscopy in 06/2018. Has been advised no further colonoscopies.   She has hx of depression, has been in remission on paxil 30 mg daily.   BMI is There is no height or weight on file to calculate BMI., she has been working on diet, mainly practices moderation, eating out of her neighbor's vegetable garden during the summer. Generally very active, lots of hours in yard.  Wt Readings from Last 3 Encounters:  01/22/21 151 lb 9.6 oz (68.8 kg)  07/27/20 151 lb 6.4 oz (68.7 kg)  04/20/20 154 lb (69.9 kg)   She has hx of SVT in 2013,  has been on bisoprolol without suspected reoccurrence   Her blood pressure has been controlled at home, today their BP is    Pulse was been  low, 40s in office with positional dizziness, bisoprolol was reduced further to 2.5 mg, also taking *** amlodipine, olmesartan Give verapamil 120 mg IR PRN palpitations *** She does not workout. She denies chest pain, shortness of breath, she does endorse some episodes of dizziness with sudden positions.   Atorvastatin 80 mg daily She is on cholesterol medication and denies myalgias. Her cholesterol is not at goal. The cholesterol last visit was:   Lab Results  Component Value Date   CHOL 149 01/22/2021   HDL 48 (L) 01/22/2021   LDLCALC 84 01/22/2021   TRIG 83 01/22/2021   CHOLHDL 3.1 01/22/2021   Historically Kristen [name] has had well controlled prediabetes secondary to diet and exercise Lab Results  Component Value Date   HGBA1C 5.7 (H) 01/22/2021   Last GFR Lab Results  Component Value Date   GFRNONAA 73 07/27/2020   Patient is on Vitamin D supplement.   Lab Results  Component Value Date   VD25OH 52 01/22/2021      Medication Review: Current Outpatient Medications on File Prior to Visit  Medication Sig Dispense Refill   amLODipine (NORVASC) 2.5 MG tablet Take  1 tablet  Daily  for BP / Patient knows to take by mouth 90 tablet 3   aspirin EC 81 MG tablet Take 81 mg by mouth daily.     atorvastatin (LIPITOR) 40 MG tablet Take 1 tablet Daily for Cholesterol 90 tablet 3   bisoprolol-hydrochlorothiazide (ZIAC) 2.5-6.25 MG tablet Take  1 tablet  Daily  for BP 90 tablet 3   CHOLECALCIFEROL PO Take 5,000 Units by mouth in the morning, at noon, and at bedtime.      gabapentin (NEURONTIN) 600 MG tablet Take  1/2 to 1 tablet  2 to 3 x /day  for Neuropathy pains 270 tablet 3   meclizine (ANTIVERT) 25 MG tablet Take  1 tablet  2 to 3 x /day  if needed for Vertigo  /  Patient knows to take by mouth 90 tablet 1   meloxicam (MOBIC) 15 MG tablet Take 1/2 to 1 tablet daily with  Food for pain  & Inflammation- try to limit to max 5 days   /week (Patient taking differently: as needed. Take 1/2  to 1 tablet daily with  Food for pain  & Inflammation- try to limit to max 5 days   /week) 90 tablet 0   nitrofurantoin, macrocrystal-monohydrate, (MACROBID) 100 MG capsule Take  1 capsule  2 x /day  after Meals for UTI 14 capsule 0   olmesartan (BENICAR) 40 MG tablet Take  1 tablet  Every Night for BP / Patient knows to take by mouth 90 tablet 3   pantoprazole (PROTONIX) 40 MG tablet Take  1 tablet  Daily  to Prevent Indigestion & Heartburn  /  Patient knows to take by mouth 90 tablet 3   PARoxetine (PAXIL) 30 MG tablet Take  1 tablet  Daily  for Mood  /   Patient knows to take by mouth 90 tablet 3   No current facility-administered medications on file prior to visit.    Allergies: Allergies  Allergen Reactions   Aspirin Other (See Comments)    High Doses stomach trouble   Codeine Nausea And Vomiting   Fenofibrate Cough   Hydrochlorothiazide Other (See Comments)    Fatigue  Metoclopramide Other (See Comments)    Sedation    Current Problems (verified) has SVT (supraventricular tachycardia) ([place]); GERD (gastroesophageal reflux disease); Major depression in remission ([place]); Overweight (BMI 25.0-29.9); History of colon cancer, stage II; Vitamin D deficiency; Prediabetes; Hyperlipidemia, mixed; Medication management; Pituitary tumor (excised 2013); Essential hypertension; Abnormal glucose; Bradycardia; and Osteopenia on their problem list.  Screening Tests Immunization History  Administered Date(s) Administered   Influenza Split 04/19/2012   Influenza, High Dose Seasonal PF 04/17/2013, 03/12/2014, 03/03/2017, 04/12/2018, 04/17/2019, 04/20/2020   Pneumococcal Conjugate-13 09/30/2014   Pneumococcal Polysaccharide-23 04/19/2012   Td 07/20/2005, 12/01/2015    Preventative care: DEXA: 11/23/2020 L fem T -1.3 *** Mammogram:  2015 - discussed and given breast center number to schedule *** Last colonoscopy: 2015 - 06/2018 - DONE  Prior vaccinations: TD or Tdap: 2017  Shingles:  *** Influenza: 2021, TODAY ***  Pneumococcal: 2013 Prevnar13: 2016  Covid 19: declines, just had covid 19 in Oct ***  Names of Other Physician/Practitioners you currently use: 1. [facility] here for primary care 2. Dr. Gershon Crane, eye doctor, last visit 2019, needs to schedule follow up  3. Dr. [place] Grater, dentist, last visit 2021, have partial   Patient Care Team: Unk Pinto, MD as PCP - General (Internal Medicine) Zadie Rhine Clent Demark, MD as Consulting Physician (Ophthalmology)  Surgical: She  has a past surgical history that includes Colon resection (1988); Colonoscopy (09/20/2011); Hot hemostasis (09/20/2011); Cataract extraction (June/july 2013); Colonoscopy (12/15/2011); Colonoscopy (01/26/2012); Eye surgery; Tumor removal; Craniotomy (04/18/2012); Transnasal approach (04/18/2012); Laparoscopic right hemi colectomy (06/27/2012); Cystoscopy with ureteroscopy and stent placement (Bilateral, 10/01/2015); Back surgery; Colon surgery (1990's); Cystoscopy with ureteroscopy and stent placement (Right, 10/19/2015); and Holmium laser application (Right, 4/92/0100). Family Her family history includes Brain cancer in her cousin; Breast cancer (age of onset: 56) in her sister; Cancer in her maternal aunt; Colon cancer in her cousin; Colon cancer (age of onset: 72) in an other family member; Colon cancer (age of onset: 63) in her sister; Colon polyps in her son, son and another family member; Dementia in her sister; Heart disease in her father and mother; Liver cancer in her maternal uncle. Social history  She reports that she has never smoked. She has never used smokeless tobacco. She reports that she does not drink alcohol and does not use drugs.  MEDICARE WELLNESS OBJECTIVES: Physical activity:   Cardiac risk factors:   Depression/mood screen:   Depression screen [facility] 2/9 01/21/2021  Decreased Interest 0  Down, Depressed, Hopeless 0  PHQ - 2 Score 0  Altered sleeping  -  Tired, decreased energy -  Change in appetite -  Feeling bad or failure about yourself  -  Trouble concentrating -  Moving slowly or fidgety/restless -  Suicidal thoughts -  PHQ-9 Score -  Difficult doing work/chores -    ADLs:  In your present state of health, do you have any difficulty performing the following activities: 01/21/2021  Hearing? N  Vision? N  Difficulty concentrating or making decisions? N  Walking or climbing stairs? N  Dressing or bathing? N  Some recent data might be hidden     Cognitive Testing  Alert? Yes  Normal Appearance?Yes  Oriented to person? Yes  Place? Yes   Time? Yes  Recall of three objects?  Yes  Can perform simple calculations? Yes  Displays appropriate judgment?Yes  Can read the correct time from a watch face?Yes  EOL planning:     Objective:   There  were no vitals filed for this visit.  There is no height or weight on file to calculate BMI.  General appearance: alert, no distress, WD/WN, female HEENT: normocephalic, sclerae anicteric, TMs pearly, nares patent, no discharge or erythema, pharynx normal Oral cavity: MMM, no lesions Neck: supple, no lymphadenopathy, no thyromegaly, no masses Heart: RRR, normal S1, S2, no murmurs Lungs: CTA bilaterally, no wheezes, rhonchi, or rales Abdomen: +bs, soft, non tender, non distended, no masses, no hepatomegaly, no splenomegaly Musculoskeletal: Cast present on the right ankle, nontender, no swelling, no obvious deformity Extremities: no edema, no cyanosis, no clubbing Pulses: 2+ symmetric, upper and lower extremities, normal cap refill Neurological: alert, oriented x 3, CN2-12 intact, strength normal upper extremities and lower extremities, sensation normal throughout, DTRs 2+ throughout, no cerebellar signs, gait normal Psychiatric: normal affect, behavior normal, pleasant   Medicare Attestation I have personally reviewed: The patient's medical and social history Their use of alcohol,  tobacco or illicit drugs Their current medications and supplements The patient's functional ability including ADLs,fall risks, home safety risks, cognitive, and hearing and visual impairment Diet and physical activities Evidence for depression or mood disorders  The patient's weight, height, BMI, and visual acuity have been recorded in the chart.  I have made referrals, counseling, and provided education to the patient based on review of the above and I have provided the patient with a written personalized care plan for preventive services.     Izora Ribas, NP   04/28/2021

## 2021-05-04 ENCOUNTER — Ambulatory Visit: Payer: PPO | Admitting: Adult Health

## 2021-05-04 DIAGNOSIS — M8589 Other specified disorders of bone density and structure, multiple sites: Secondary | ICD-10-CM

## 2021-05-04 DIAGNOSIS — F325 Major depressive disorder, single episode, in full remission: Secondary | ICD-10-CM

## 2021-05-04 DIAGNOSIS — Z0000 Encounter for general adult medical examination without abnormal findings: Secondary | ICD-10-CM

## 2021-05-04 DIAGNOSIS — R7309 Other abnormal glucose: Secondary | ICD-10-CM

## 2021-05-04 DIAGNOSIS — K219 Gastro-esophageal reflux disease without esophagitis: Secondary | ICD-10-CM

## 2021-05-04 DIAGNOSIS — I471 Supraventricular tachycardia: Secondary | ICD-10-CM

## 2021-05-04 DIAGNOSIS — D497 Neoplasm of unspecified behavior of endocrine glands and other parts of nervous system: Secondary | ICD-10-CM

## 2021-05-04 DIAGNOSIS — E559 Vitamin D deficiency, unspecified: Secondary | ICD-10-CM

## 2021-05-04 DIAGNOSIS — E782 Mixed hyperlipidemia: Secondary | ICD-10-CM

## 2021-05-04 DIAGNOSIS — E663 Overweight: Secondary | ICD-10-CM

## 2021-05-04 DIAGNOSIS — R001 Bradycardia, unspecified: Secondary | ICD-10-CM

## 2021-05-04 DIAGNOSIS — I10 Essential (primary) hypertension: Secondary | ICD-10-CM

## 2021-05-04 DIAGNOSIS — Z79899 Other long term (current) drug therapy: Secondary | ICD-10-CM

## 2021-05-10 ENCOUNTER — Telehealth: Payer: Self-pay | Admitting: Internal Medicine

## 2021-05-10 NOTE — Chronic Care Management (AMB) (Signed)
  Chronic Care Management   Note  05/10/2021 Name: Kristen [name] MRN: [id number] DOB: Oct 28, 1940  Kristen [name] is a 80 y.o. year old female who is a primary care patient of Unk Pinto, MD. I reached out to Kendall Flack by phone today in response to a referral sent by Ms. [place] PCP, Unk Pinto, MD.   Ms. Mathisen was given information about Chronic Care Management services today including:  CCM service includes personalized support from designated clinical staff supervised by her physician, including individualized plan of care and coordination with other care providers 24/7 contact phone numbers for assistance for urgent and routine care needs. Service will only be billed when office clinical staff spend 20 minutes or more in a month to coordinate care. Only one practitioner may furnish and bill the service in a calendar month. The patient may stop CCM services at any time (effective at the end of the month) by phone call to the office staff.   Patient agreed to services and verbal consent obtained.   Follow up plan:   [place] Secretary/administrator

## 2021-05-11 NOTE — Progress Notes (Deleted)
MEDICARE ANNUAL WELLNESS VISIT AND FOLLOW UP Assessment:    Encounter for Medicare annual wellness exam Patient agrees to schedule mammogram for fall, dexa  SVT (supraventricular tachycardia) ([place]) No known recurrence, continue BB; monitor   Hypertension Will reduce BB due to bradycardia; start ziac 2.5/6.25 mg, monitor BP and pulse Monitor blood pressure at home; call if consistently over 130/80 Continue DASH diet.   Reminder to go to the ER if any CP, SOB, nausea, dizziness, severe HA, changes vision/speech, left arm numbness and tingling and jaw pain.  Gastroesophageal reflux disease, esophagitis presence not specified Well managed on current medications Discussed diet, avoiding triggers and other lifestyle changes  Pituitary tumor (excised 2013) S/p excision, doing well   Vitamin D deficiency Continue supplementation Defer vitamin D level to CPE  Prediabetes Discussed disease and risks Discussed diet/exercise, weight management  A1C q65m; monitor serum glucose, weight   BMI 26  Long discussion about weight, diet, and exercise Recommended diet heavy in fruits and veggies and low in animal meats, cheeses, and dairy products, appropriate calorie intake Discussed appropriate weight for height  Follow up at next visit  Medication management CBC, CMP/GFR, magnesium  Major depression in remission ([place]) Continue medications  Lifestyle discussed: diet/exerise, sleep hygiene, stress management, hydration  Hyperlipidemia Continue medications Continue low cholesterol diet and exercise.  Check lipid panel.   History of colon cancer, stage II Colonoscopy 06/2018; has been released  Bradycardia Sinus bradycardia per EKG 01/2020, taking bisoprolol 5 mg, will reduce to 2.5 mg and monitor, may need to taper off -  Monitor for fatigue, dizziness  Need for influenza vaccine High dose quadrivalent administered without complication today    Over 30 minutes of exam, counseling,  chart review, and critical decision making was performed  Future Appointments  Date Time Provider [place]  05/13/2021  4:00 PM Magda Bernheim, NP [facility] None  06/28/2021  1:30 PM Newton Pigg, RPH [facility] None  08/05/2021 11:30 AM Unk Pinto, MD [facility] None  01/24/2022 10:00 AM Unk Pinto, MD [facility] None  05/04/2022 11:00 AM Liane Comber, NP [facility] None     Plan:   During the course of the visit the patient was educated and counseled about appropriate screening and preventive services including:   Pneumococcal vaccine  Influenza vaccine Prevnar 13 Td vaccine Screening electrocardiogram Colorectal cancer screening Diabetes screening Glaucoma screening Nutrition counseling    Subjective:  Kristen [name] is a 80 y.o. female who presents for Medicare Annual Wellness Visit and 3 month follow up for HTN, hyperlipidemia, prediabetes, and vitamin D Def.   In 2013, patient underwent resection of a pituitary tumor and has done well since. She follows up with ? Neurology, reports last 07/2018, will request reports.   She doesn't have car or license, her friend/neighbor is available to drive when needed.   She has a history of colon cancer over 10 years ago, resolved with resection, had recent colonoscopy in 06/2018. Has been advised no further colonoscopies.   BMI is There is no height or weight on file to calculate BMI., she has been working on diet, mainly practices moderation, eating out of her neighbor's vegetable garden during the summer. Generally very active, lots of hours in yard.  Wt Readings from Last 3 Encounters:  01/22/21 151 lb 9.6 oz (68.8 kg)  07/27/20 151 lb 6.4 oz (68.7 kg)  04/20/20 154 lb (69.9 kg)   She has hx of SVT in 2013, has been on bisoprolol 10 mg daily without  suspected reoccurrence  Her blood pressure has been controlled at home, today their BP is    Pulse has been low, 40s in office, hasn't been checking at home,  taking bisoprolol 5 mg with olmesartan 40 mg daily  She does not workout. She denies chest pain, shortness of breath, she does endorse some episodes of dizziness with sudden positions.   Atorvastatin 80 mg daily She is on cholesterol medication and denies myalgias. Her cholesterol is not at goal. The cholesterol last visit was:   Lab Results  Component Value Date   CHOL 149 01/22/2021   HDL 48 (L) 01/22/2021   LDLCALC 84 01/22/2021   TRIG 83 01/22/2021   CHOLHDL 3.1 01/22/2021   Historically Mrs. Faison has had well controlled prediabetes secondary to diet and exercise Lab Results  Component Value Date   HGBA1C 5.7 (H) 01/22/2021   Last GFR Lab Results  Component Value Date   GFRNONAA 73 07/27/2020   Patient is on Vitamin D supplement.   Lab Results  Component Value Date   VD25OH 52 01/22/2021      Medication Review: Current Outpatient Medications on File Prior to Visit  Medication Sig Dispense Refill   amLODipine (NORVASC) 2.5 MG tablet Take  1 tablet  Daily  for BP / Patient knows to take by mouth 90 tablet 3   aspirin EC 81 MG tablet Take 81 mg by mouth daily.     atorvastatin (LIPITOR) 40 MG tablet Take 1 tablet Daily for Cholesterol 90 tablet 3   bisoprolol-hydrochlorothiazide (ZIAC) 2.5-6.25 MG tablet Take  1 tablet  Daily  for BP 90 tablet 3   CHOLECALCIFEROL PO Take 5,000 Units by mouth in the morning, at noon, and at bedtime.      gabapentin (NEURONTIN) 600 MG tablet Take  1/2 to 1 tablet  2 to 3 x /day  for Neuropathy pains 270 tablet 3   meclizine (ANTIVERT) 25 MG tablet Take  1 tablet  2 to 3 x /day  if needed for Vertigo  /  Patient knows to take by mouth 90 tablet 1   meloxicam (MOBIC) 15 MG tablet Take 1/2 to 1 tablet daily with  Food for pain  & Inflammation- try to limit to max 5 days   /week (Patient taking differently: as needed. Take 1/2 to 1 tablet daily with  Food for pain  & Inflammation- try to limit to max 5 days   /week) 90 tablet 0   nitrofurantoin,  macrocrystal-monohydrate, (MACROBID) 100 MG capsule Take  1 capsule  2 x /day  after Meals for UTI 14 capsule 0   olmesartan (BENICAR) 40 MG tablet Take  1 tablet  Every Night for BP / Patient knows to take by mouth 90 tablet 3   pantoprazole (PROTONIX) 40 MG tablet Take  1 tablet  Daily  to Prevent Indigestion & Heartburn  /  Patient knows to take by mouth 90 tablet 3   PARoxetine (PAXIL) 30 MG tablet Take  1 tablet  Daily  for Mood  /   Patient knows to take by mouth 90 tablet 3   No current facility-administered medications on file prior to visit.    Allergies: Allergies  Allergen Reactions   Aspirin Other (See Comments)    High Doses stomach trouble   Codeine Nausea And Vomiting   Fenofibrate Cough   Hydrochlorothiazide Other (See Comments)    Fatigue   Metoclopramide Other (See Comments)    Sedation    Current  Problems (verified) has SVT (supraventricular tachycardia) ([place]); GERD (gastroesophageal reflux disease); Major depression in remission ([place]); Overweight (BMI 25.0-29.9); History of colon cancer, stage II; Vitamin D deficiency; Prediabetes; Hyperlipidemia, mixed; Medication management; Pituitary tumor (excised 2013); Essential hypertension; Abnormal glucose; Bradycardia; and Osteopenia on their problem list.  Screening Tests Immunization History  Administered Date(s) Administered   Influenza Split 04/19/2012   Influenza, High Dose Seasonal PF 04/17/2013, 03/12/2014, 03/03/2017, 04/12/2018, 04/17/2019, 04/20/2020   Pneumococcal Conjugate-13 09/30/2014   Pneumococcal Polysaccharide-23 04/19/2012   Td 07/20/2005, 12/01/2015    Preventative care: DEXA: agreeable to schedule  Mammogram:  2015 - discussed and given breast center number to schedule  Last colonoscopy: 2015 - 06/2018 - DONE  Prior vaccinations: TD or Tdap: 2017  Influenza: 2020, TODAY  Pneumococcal: 2013 Prevnar13: 2016  Covid 19: declines, just had covid 19 in Oct  Names of Other  Physician/Practitioners you currently use: 1. [facility] here for primary care 2. Dr. Gershon Crane, eye doctor, last visit 2019, needs to schedule follow up  3. Dr. Seward Grater, dentist, last visit 2021, have partial   Patient Care Team: Unk Pinto, MD as PCP - General (Internal Medicine) Zadie Rhine Clent Demark, MD as Consulting Physician (Ophthalmology) Newton Pigg, [facility] as Pharmacist (Pharmacist)  Surgical: She  has a past surgical history that includes Colon resection (1988); Colonoscopy (09/20/2011); Hot hemostasis (09/20/2011); Cataract extraction (June/july 2013); Colonoscopy (12/15/2011); Colonoscopy (01/26/2012); Eye surgery; Tumor removal; Craniotomy (04/18/2012); Transnasal approach (04/18/2012); Laparoscopic right hemi colectomy (06/27/2012); Cystoscopy with ureteroscopy and stent placement (Bilateral, 10/01/2015); Back surgery; Colon surgery (1990's); Cystoscopy with ureteroscopy and stent placement (Right, 10/19/2015); and Holmium laser application (Right, 02/25/1940). Family Her family history includes Brain cancer in her cousin; Breast cancer (age of onset: 34) in her sister; Cancer in her maternal aunt; Colon cancer in her cousin; Colon cancer (age of onset: 3) in an other family member; Colon cancer (age of onset: 22) in her sister; Colon polyps in her son, son and another family member; Dementia in her sister; Heart disease in her father and mother; Liver cancer in her maternal uncle. Social history  She reports that she has never smoked. She has never used smokeless tobacco. She reports that she does not drink alcohol and does not use drugs.  MEDICARE WELLNESS OBJECTIVES: Physical activity:   Cardiac risk factors:   Depression/mood screen:   Depression screen [facility] 2 2/9 01/21/2021  Decreased Interest 0  Down, Depressed, Hopeless 0  PHQ - 2 Score 0  Altered sleeping -  Tired, decreased energy -  Change in appetite -  Feeling bad or failure about  yourself  -  Trouble concentrating -  Moving slowly or fidgety/restless -  Suicidal thoughts -  PHQ-9 Score -  Difficult doing work/chores -    ADLs:  In your present state of health, do you have any difficulty performing the following activities: 01/21/2021  Hearing? N  Vision? N  Difficulty concentrating or making decisions? N  Walking or climbing stairs? N  Dressing or bathing? N  Some recent data might be hidden     Cognitive Testing  Alert? Yes  Normal Appearance?Yes  Oriented to person? Yes  Place? Yes   Time? Yes  Recall of three objects?  Yes  Can perform simple calculations? Yes  Displays appropriate judgment?Yes  Can read the correct time from a watch face?Yes  EOL planning:     Objective:   There were no vitals filed for this visit.  There is no height  or weight on file to calculate BMI.  General appearance: alert, no distress, WD/WN, female HEENT: normocephalic, sclerae anicteric, TMs pearly, nares patent, no discharge or erythema, pharynx normal Oral cavity: MMM, no lesions Neck: supple, no lymphadenopathy, no thyromegaly, no masses Heart: RRR, normal S1, S2, no murmurs Lungs: CTA bilaterally, no wheezes, rhonchi, or rales Abdomen: +bs, soft, non tender, non distended, no masses, no hepatomegaly, no splenomegaly Musculoskeletal: Cast present on the right ankle, nontender, no swelling, no obvious deformity Extremities: no edema, no cyanosis, no clubbing Pulses: 2+ symmetric, upper and lower extremities, normal cap refill Neurological: alert, oriented x 3, CN2-12 intact, strength normal upper extremities and lower extremities, sensation normal throughout, DTRs 2+ throughout, no cerebellar signs, gait normal Psychiatric: normal affect, behavior normal, pleasant   Medicare Attestation I have personally reviewed: The patient's medical and social history Their use of alcohol, tobacco or illicit drugs Their current medications and supplements The patient's  functional ability including ADLs,fall risks, home safety risks, cognitive, and hearing and visual impairment Diet and physical activities Evidence for depression or mood disorders  The patient's weight, height, BMI, and visual acuity have been recorded in the chart.  I have made referrals, counseling, and provided education to the patient based on review of the above and I have provided the patient with a written personalized care plan for preventive services.     Magda Bernheim, NP   05/11/2021

## 2021-05-13 ENCOUNTER — Ambulatory Visit: Payer: PPO | Admitting: Nurse Practitioner

## 2021-05-13 DIAGNOSIS — E559 Vitamin D deficiency, unspecified: Secondary | ICD-10-CM

## 2021-05-13 DIAGNOSIS — Z79899 Other long term (current) drug therapy: Secondary | ICD-10-CM

## 2021-05-13 DIAGNOSIS — I10 Essential (primary) hypertension: Secondary | ICD-10-CM

## 2021-05-13 DIAGNOSIS — E782 Mixed hyperlipidemia: Secondary | ICD-10-CM

## 2021-05-13 DIAGNOSIS — Z0000 Encounter for general adult medical examination without abnormal findings: Secondary | ICD-10-CM

## 2021-05-13 DIAGNOSIS — D497 Neoplasm of unspecified behavior of endocrine glands and other parts of nervous system: Secondary | ICD-10-CM

## 2021-05-13 DIAGNOSIS — F325 Major depressive disorder, single episode, in full remission: Secondary | ICD-10-CM

## 2021-05-13 DIAGNOSIS — I471 Supraventricular tachycardia: Secondary | ICD-10-CM

## 2021-05-13 DIAGNOSIS — K219 Gastro-esophageal reflux disease without esophagitis: Secondary | ICD-10-CM

## 2021-05-13 DIAGNOSIS — E663 Overweight: Secondary | ICD-10-CM

## 2021-05-13 DIAGNOSIS — R001 Bradycardia, unspecified: Secondary | ICD-10-CM

## 2021-05-13 DIAGNOSIS — Z85038 Personal history of other malignant neoplasm of large intestine: Secondary | ICD-10-CM

## 2021-05-13 DIAGNOSIS — R7309 Other abnormal glucose: Secondary | ICD-10-CM

## 2021-05-24 ENCOUNTER — Other Ambulatory Visit: Payer: Self-pay | Admitting: Internal Medicine

## 2021-05-24 ENCOUNTER — Other Ambulatory Visit: Payer: Self-pay | Admitting: Adult Health

## 2021-05-24 DIAGNOSIS — E782 Mixed hyperlipidemia: Secondary | ICD-10-CM

## 2021-05-27 ENCOUNTER — Ambulatory Visit: Payer: PPO | Admitting: Nurse Practitioner

## 2021-06-04 NOTE — Progress Notes (Signed)
MEDICARE ANNUAL WELLNESS VISIT AND FOLLOW UP Assessment:    Encounter for Medicare annual wellness exam Patient refuses Mammogram- would not pursue treatment Given info for [facility] to set up appointment for eye exam  SVT (supraventricular tachycardia) ([place]) No known recurrence, continue BB; monitor   Hypertension Will reduce BB due to bradycardia; start ziac 2.5/6.25 mg, monitor BP and pulse Monitor blood pressure at home; call if consistently over 130/80 Continue DASH diet.   Reminder to go to the ER if any CP, SOB, nausea, dizziness, severe HA, changes vision/speech, left arm numbness and tingling and jaw pain. - CBC  Gastroesophageal reflux disease, esophagitis presence not specified Well managed on current medications Discussed diet, avoiding triggers and other lifestyle changes Magnesium  Pituitary tumor (excised 2013) S/p excision, doing well   Vitamin D deficiency Continue supplementation Defer vitamin D level to CPE  Prediabetes Discussed disease and risks Discussed diet/exercise, weight management  A1C q48m; monitor serum glucose, weight   BMI 26  Long discussion about weight, diet, and exercise Recommended diet heavy in fruits and veggies and low in animal meats, cheeses, and dairy products, appropriate calorie intake Discussed appropriate weight for height  Follow up at next visit  Medication management CBC, CMP/GFR, magnesium  Major depression in remission ([place]) Continue medications  Lifestyle discussed: diet/exerise, sleep hygiene, stress management, hydration  Hyperlipidemia Continue medications Continue low cholesterol diet and exercise.  Check lipid panel.  Check CMP  History of colon cancer, stage II Colonoscopy 06/2018; has been released  Bradycardia Has improved since changing to Ziac 2.10/10/23 Monitor for fatigue, dizziness  Flu Vaccine need High dose quadrivalent administered without complication today   Peripheral  PolyNeuropathy in left foot Gelsemium [place] homeopathic medication which is helping   Over 30 minutes of exam, counseling, chart review, and critical decision making was performed  Future Appointments  Date Time Provider [place]  06/28/2021  1:30 PM Newton Pigg, [facility] None  08/05/2021 11:30 AM Unk Pinto, MD [facility] None  01/24/2022 10:00 AM Unk Pinto, MD [facility] None  05/04/2022 11:00 AM Liane Comber, NP [facility] None     Plan:   During the course of the visit the patient was educated and counseled about appropriate screening and preventive services including:   Pneumococcal vaccine  Influenza vaccine Prevnar 13 Td vaccine Screening electrocardiogram Colorectal cancer screening Diabetes screening Glaucoma screening Nutrition counseling    Subjective:  Kristen [name] is a 80 y.o. female who presents for Medicare Annual Wellness Visit and 3 month follow up for HTN, hyperlipidemia, prediabetes, and vitamin D Def.   In 2013, patient underwent resection of a pituitary tumor and has done well since. No longer follows up with endocrinology   Does have burred vision, has not seen eye doctor in over 5 years, knows she needs to schedule.  She doesn't have car or license, her friend/neighbor is available to drive when needed.   She has a history of colon cancer over 10 years ago, resolved with resection, had recent colonoscopy in 06/2018. Has been advised no further colonoscopies.   BMI is Body mass index is 25.92 kg/m., she has been working on diet, mainly practices moderation, eating out of her neighbor's vegetable garden during the summer. Generally very active, lots of hours in yard.  Wt Readings from Last 3 Encounters:  06/08/21 153 lb 6.4 oz (69.6 kg)  01/22/21 151 lb 9.6 oz (68.8 kg)  07/27/20 151 lb 6.4 oz (68.7 kg)   She has hx  of SVT in 2013, has been on bisoprolol /HCTZ 2.5/6.25 mg daily without suspected  reoccurrence  Her blood pressure has been controlled at home, today their BP is BP: (!) 148/60  BP Readings from Last 3 Encounters:  06/08/21 (!) 148/60  01/22/21 116/62  07/27/20 136/84       Pulse has been low in the past, 40s in office, hasn't been checking at home, taking Ziac 2.5/6.25 mg with olmesartan 40 mg daily . Pulse is better today at 63 She does not workout. She denies chest pain, shortness of breath, she does endorse some episodes of dizziness with sudden positions.  Pulse Readings from Last 3 Encounters:  06/08/21 63  01/22/21 (!) 46  07/27/20 (!) 47    Atorvastatin 40 mg daily She is on cholesterol medication and denies myalgias. Her cholesterol is not at goal. The cholesterol last visit was:   Lab Results  Component Value Date   CHOL 149 01/22/2021   HDL 48 (L) 01/22/2021   LDLCALC 84 01/22/2021   TRIG 83 01/22/2021   CHOLHDL 3.1 01/22/2021   Historically Kristen [name] has had well controlled prediabetes secondary to diet and exercise Lab Results  Component Value Date   HGBA1C 5.7 (H) 01/22/2021   Last GFR Lab Results  Component Value Date   GFRNONAA 73 07/27/2020   Patient is on Vitamin D supplement.   Lab Results  Component Value Date   VD25OH 52 01/22/2021      Medication Review: Current Outpatient Medications on File Prior to Visit  Medication Sig Dispense Refill   amLODipine (NORVASC) 2.5 MG tablet Take  1 tablet  Daily  for BP / Patient knows to take by mouth 90 tablet 3   aspirin EC 81 MG tablet Take 81 mg by mouth daily.     atorvastatin (LIPITOR) 40 MG tablet Take  1 tablet  Daily  for Cholesterol 90 tablet 3   bisoprolol-hydrochlorothiazide (ZIAC) 2.5-6.25 MG tablet Take  1 tablet  Daily  for BP 90 tablet 3   CHOLECALCIFEROL PO Take 5,000 Units by mouth in the morning, at noon, and at bedtime.      gabapentin (NEURONTIN) 600 MG tablet Take  1/2 to 1 tablet  2 to 3 x /day  for Neuropathy pains 270 tablet 3   meclizine (ANTIVERT) 25 MG tablet  Take  1 tablet  2 to 3 x /day  if needed for Vertigo  /  Patient knows to take by mouth 90 tablet 1   meloxicam (MOBIC) 15 MG tablet Take 1/2 to 1 tablet daily with  Food for pain  & Inflammation- try to limit to max 5 days   /week (Patient taking differently: as needed. Take 1/2 to 1 tablet daily with  Food for pain  & Inflammation- try to limit to max 5 days   /week) 90 tablet 0   olmesartan (BENICAR) 40 MG tablet Take  1 tablet  Every Night for BP / Patient knows to take by mouth 90 tablet 3   pantoprazole (PROTONIX) 40 MG tablet Take  1 tablet  Daily  to Prevent Indigestion & Heartburn  /  Patient knows to take by mouth 90 tablet 3   PARoxetine (PAXIL) 30 MG tablet Take  1 tablet  Daily  for Mood  /   Patient knows to take by mouth 90 tablet 3   No current facility-administered medications on file prior to visit.    Allergies: Allergies  Allergen Reactions   Aspirin Other (  See Comments)    High Doses stomach trouble   Codeine Nausea And Vomiting   Fenofibrate Cough   Hydrochlorothiazide Other (See Comments)    Fatigue   Metoclopramide Other (See Comments)    Sedation    Current Problems (verified) has SVT (supraventricular tachycardia) ([place]); GERD (gastroesophageal reflux disease); Major depression in remission ([place]); Overweight (BMI 25.0-29.9); History of colon cancer, stage II; Vitamin D deficiency; Prediabetes; Hyperlipidemia, mixed; Medication management; Pituitary tumor (excised 2013); Essential hypertension; Abnormal glucose; Bradycardia; and Osteopenia on their problem list.  Screening Tests Immunization History  Administered Date(s) Administered   Influenza Split 04/19/2012   Influenza, High Dose Seasonal PF 04/17/2013, 03/12/2014, 03/03/2017, 04/12/2018, 04/17/2019, 04/20/2020   Pneumococcal Conjugate-13 09/30/2014   Pneumococcal Polysaccharide-23 04/19/2012   Td 07/20/2005, 12/01/2015    Preventative care: DEXA: 11/2020 osteopenia Mammogram:  2015 - does not wish to  have it done Last colonoscopy: 2015 - 06/2018 - DONE  Prior vaccinations: TD or Tdap: 2017  Influenza: 06/08/2021  Pneumococcal: 2013 Prevnar13: 2016  Covid 19: declines, just had covid 19 in Oct  Names of Other Physician/Practitioners you currently use: 1. [facility] here for primary care 2. Dr. Gershon Crane, eye doctor, last visit 2018, needs to schedule follow up  3. Dr. Seward Grater, dentist, last visit 2022, have partial   Patient Care Team: Unk Pinto, MD as PCP - General (Internal Medicine) Zadie Rhine Clent Demark, MD as Consulting Physician (Ophthalmology) Newton Pigg, [facility] as Pharmacist (Pharmacist)  Surgical: She  has a past surgical history that includes Colon resection (1988); Colonoscopy (09/20/2011); Hot hemostasis (09/20/2011); Cataract extraction (June/july 2013); Colonoscopy (12/15/2011); Colonoscopy (01/26/2012); Eye surgery; Tumor removal; Craniotomy (04/18/2012); Transnasal approach (04/18/2012); Laparoscopic right hemi colectomy (06/27/2012); Cystoscopy with ureteroscopy and stent placement (Bilateral, 10/01/2015); Back surgery; Colon surgery (1990's); Cystoscopy with ureteroscopy and stent placement (Right, 10/19/2015); and Holmium laser application (Right, 4/40/1027). Family Her family history includes Brain cancer in her cousin; Breast cancer (age of onset: 58) in her sister; Cancer in her maternal aunt; Colon cancer in her cousin; Colon cancer (age of onset: 38) in an other family member; Colon cancer (age of onset: 44) in her sister; Colon polyps in her son, son and another family member; Dementia in her sister; Heart disease in her father and mother; Liver cancer in her maternal uncle. Social history  She reports that she has never smoked. She has never used smokeless tobacco. She reports that she does not drink alcohol and does not use drugs.  MEDICARE WELLNESS OBJECTIVES: Physical activity: Current Exercise Habits: Home exercise routine,  Type of exercise: Other - see comments (housework), Exercise limited by: cardiac condition(s) Cardiac risk factors: Cardiac Risk Factors include: advanced age (>13men, >28 women);dyslipidemia;hypertension;sedentary lifestyle Depression/mood screen:   Depression screen [facility] 2/9 06/08/2021  Decreased Interest 1  Down, Depressed, Hopeless 0  PHQ - 2 Score 1  Altered sleeping -  Tired, decreased energy -  Change in appetite -  Feeling bad or failure about yourself  -  Trouble concentrating -  Moving slowly or fidgety/restless -  Suicidal thoughts -  PHQ-9 Score -  Difficult doing work/chores -    ADLs:  In your present state of health, do you have any difficulty performing the following activities: 06/08/2021 01/21/2021  Hearing? N N  Vision? N N  Difficulty concentrating or making decisions? N N  Walking or climbing stairs? N N  Dressing or bathing? N N  Doing errands, shopping? Y -  Some recent data might be  hidden     Cognitive Testing  Alert? Yes  Normal Appearance?Yes  Oriented to person? Yes  Place? Yes   Time? Yes  Recall of three objects?  Yes  Can perform simple calculations? Yes  Displays appropriate judgment?Yes  Can read the correct time from a watch face?Yes  EOL planning: Does Patient Have a Medical Advance Directive?: No Does patient want to make changes to medical advance directive?: No - Patient declined   Objective:   Today's Vitals   06/08/21 1411  BP: (!) 148/60  Pulse: 63  Temp: (!) 97.3 F (36.3 C)  SpO2: 95%  Weight: 153 lb 6.4 oz (69.6 kg)    Body mass index is 25.92 kg/m.  General appearance: alert, no distress, WD/WN, female HEENT: normocephalic, sclerae anicteric, TMs pearly, nares patent, no discharge or erythema, pharynx normal Oral cavity: MMM, no lesions Neck: supple, no lymphadenopathy, no thyromegaly, no masses Heart: RRR, normal S1, S2, no murmurs Lungs: CTA bilaterally, no wheezes, rhonchi, or rales Abdomen: +bs, soft, non tender,  non distended, no masses, no hepatomegaly, no splenomegaly Musculoskeletal: Cast present on the right ankle, nontender, no swelling, no obvious deformity Extremities: no edema, no cyanosis, no clubbing Pulses: 2+ symmetric, upper and lower extremities, normal cap refill Neurological: alert, oriented x 3, CN2-12 intact, strength normal upper extremities and lower extremities, sensation normal throughout, DTRs 2+ throughout, no cerebellar signs, gait normal Psychiatric: normal affect, behavior normal, pleasant   Medicare Attestation I have personally reviewed: The patient's medical and social history Their use of alcohol, tobacco or illicit drugs Their current medications and supplements The patient's functional ability including ADLs,fall risks, home safety risks, cognitive, and hearing and visual impairment Diet and physical activities Evidence for depression or mood disorders  The patient's weight, height, BMI, and visual acuity have been recorded in the chart.  I have made referrals, counseling, and provided education to the patient based on review of the above and I have provided the patient with a written personalized care plan for preventive services.     Magda Bernheim, NP   06/08/2021

## 2021-06-08 ENCOUNTER — Other Ambulatory Visit: Payer: Self-pay

## 2021-06-08 ENCOUNTER — Encounter: Payer: Self-pay | Admitting: Nurse Practitioner

## 2021-06-08 ENCOUNTER — Ambulatory Visit (INDEPENDENT_AMBULATORY_CARE_PROVIDER_SITE_OTHER): Payer: PPO | Admitting: Nurse Practitioner

## 2021-06-08 VITALS — BP 148/60 | HR 63 | Temp 97.3°F | Wt 153.4 lb

## 2021-06-08 DIAGNOSIS — R6889 Other general symptoms and signs: Secondary | ICD-10-CM | POA: Diagnosis not present

## 2021-06-08 DIAGNOSIS — G629 Polyneuropathy, unspecified: Secondary | ICD-10-CM | POA: Diagnosis not present

## 2021-06-08 DIAGNOSIS — Z6826 Body mass index (BMI) 26.0-26.9, adult: Secondary | ICD-10-CM

## 2021-06-08 DIAGNOSIS — F325 Major depressive disorder, single episode, in full remission: Secondary | ICD-10-CM

## 2021-06-08 DIAGNOSIS — E782 Mixed hyperlipidemia: Secondary | ICD-10-CM | POA: Diagnosis not present

## 2021-06-08 DIAGNOSIS — K219 Gastro-esophageal reflux disease without esophagitis: Secondary | ICD-10-CM | POA: Diagnosis not present

## 2021-06-08 DIAGNOSIS — R7309 Other abnormal glucose: Secondary | ICD-10-CM | POA: Diagnosis not present

## 2021-06-08 DIAGNOSIS — Z0001 Encounter for general adult medical examination with abnormal findings: Secondary | ICD-10-CM

## 2021-06-08 DIAGNOSIS — Z85038 Personal history of other malignant neoplasm of large intestine: Secondary | ICD-10-CM

## 2021-06-08 DIAGNOSIS — R001 Bradycardia, unspecified: Secondary | ICD-10-CM | POA: Diagnosis not present

## 2021-06-08 DIAGNOSIS — I471 Supraventricular tachycardia: Secondary | ICD-10-CM

## 2021-06-08 DIAGNOSIS — E559 Vitamin D deficiency, unspecified: Secondary | ICD-10-CM | POA: Diagnosis not present

## 2021-06-08 DIAGNOSIS — Z79899 Other long term (current) drug therapy: Secondary | ICD-10-CM | POA: Diagnosis not present

## 2021-06-08 DIAGNOSIS — Z23 Encounter for immunization: Secondary | ICD-10-CM | POA: Diagnosis not present

## 2021-06-08 DIAGNOSIS — I10 Essential (primary) hypertension: Secondary | ICD-10-CM

## 2021-06-08 DIAGNOSIS — Z0000 Encounter for general adult medical examination without abnormal findings: Secondary | ICD-10-CM

## 2021-06-08 LAB — MAGNESIUM: Magnesium: 1.9 mg/dL (ref 1.5–2.5)

## 2021-06-08 LAB — CBC WITH DIFFERENTIAL/PLATELET
Absolute Monocytes: 461 cells/uL (ref 200–950)
Basophils Absolute: 52 cells/uL (ref 0–200)
Basophils Relative: 0.6 %
Eosinophils Absolute: 200 cells/uL (ref 15–500)
Eosinophils Relative: 2.3 %
HCT: 35.9 % (ref 35.0–45.0)
Hemoglobin: 11.6 g/dL — ABNORMAL LOW (ref 11.7–15.5)
Lymphs Abs: 1679 cells/uL (ref 850–3900)
MCH: 28.9 pg (ref 27.0–33.0)
MCHC: 32.3 g/dL (ref 32.0–36.0)
MCV: 89.3 fL (ref 80.0–100.0)
MPV: 10.9 fL (ref 7.5–12.5)
Monocytes Relative: 5.3 %
Neutro Abs: 6308 cells/uL (ref 1500–7800)
Neutrophils Relative %: 72.5 %
Platelets: 254 10*3/uL (ref 140–400)
RBC: 4.02 10*6/uL (ref 3.80–5.10)
RDW: 13.6 % (ref 11.0–15.0)
Total Lymphocyte: 19.3 %
WBC: 8.7 10*3/uL (ref 3.8–10.8)

## 2021-06-08 LAB — COMPLETE METABOLIC PANEL WITH GFR
AG Ratio: 1.9 (calc) (ref 1.0–2.5)
ALT: 12 U/L (ref 6–29)
AST: 17 U/L (ref 10–35)
Albumin: 4.4 g/dL (ref 3.6–5.1)
Alkaline phosphatase (APISO): 63 U/L (ref 37–153)
BUN: 14 mg/dL (ref 7–25)
CO2: 31 mmol/L (ref 20–32)
Calcium: 9.8 mg/dL (ref 8.6–10.4)
Chloride: 106 mmol/L (ref 98–110)
Creat: 0.7 mg/dL (ref 0.60–0.95)
Globulin: 2.3 g/dL (calc) (ref 1.9–3.7)
Glucose, Bld: 93 mg/dL (ref 65–99)
Potassium: 4.8 mmol/L (ref 3.5–5.3)
Sodium: 141 mmol/L (ref 135–146)
Total Bilirubin: 0.6 mg/dL (ref 0.2–1.2)
Total Protein: 6.7 g/dL (ref 6.1–8.1)
eGFR: 87 mL/min/{1.73_m2} (ref >=60)

## 2021-06-08 LAB — LIPID PANEL
Cholesterol: 134 mg/dL (ref <200)
HDL: 57 mg/dL (ref >=50)
LDL Cholesterol (Calc): 63 mg/dL (calc)
Non-HDL Cholesterol (Calc): 77 mg/dL (calc) (ref <130)
Total CHOL/HDL Ratio: 2.4 (calc) (ref <5.0)
Triglycerides: 59 mg/dL (ref <150)

## 2021-06-08 NOTE — Patient Instructions (Signed)
[phone] [email or web address] [address] [place], [place] [address]  Call to schedule an appointment

## 2021-06-25 ENCOUNTER — Telehealth: Payer: Self-pay

## 2021-06-25 NOTE — Telephone Encounter (Signed)
Called patient to confirm upcoming visit with CPP Seth Bake for 1/23. Unable to reach patient. Unable to leave VM d/t VM box being full. -Vanetta Shawl, [facility] 06/25/21

## 2021-06-28 ENCOUNTER — Telehealth: Payer: PPO | Admitting: Pharmacist

## 2021-08-05 ENCOUNTER — Ambulatory Visit: Payer: PPO | Admitting: Internal Medicine

## 2021-08-31 ENCOUNTER — Ambulatory Visit: Payer: PPO | Admitting: Pharmacist

## 2021-10-04 ENCOUNTER — Other Ambulatory Visit: Payer: Self-pay | Admitting: Internal Medicine

## 2021-10-04 DIAGNOSIS — R42 Dizziness and giddiness: Secondary | ICD-10-CM

## 2021-10-07 DIAGNOSIS — H26493 Other secondary cataract, bilateral: Secondary | ICD-10-CM | POA: Diagnosis not present

## 2021-10-07 DIAGNOSIS — Z961 Presence of intraocular lens: Secondary | ICD-10-CM | POA: Diagnosis not present

## 2021-11-02 ENCOUNTER — Ambulatory Visit: Payer: PPO | Admitting: Pharmacy Technician

## 2021-11-11 ENCOUNTER — Telehealth: Payer: Self-pay

## 2021-11-11 NOTE — Telephone Encounter (Signed)
LM-11/11/21-Calling pt. To schedule CP initial visit. Unable to reach pt. VM box full, unable to leave message. Will try calling at another time.  Total time spent: 2 min.Marland Kitchen

## 2021-11-15 NOTE — Telephone Encounter (Signed)
LM-11/15/21-Calling pt. To schedule CP initial visit. Spoke to pt.to r/s initial visit. Pt. Answered but when asking if appointment date/time worked for her, Pt. Did not speak and disconnected the call or possibly lost service. Will try reaching out to pt. At another time.  Total time spent: 5 min.   LM-11/17/21-Calling pt. To schedule initial CP visit. Pt. Was unavailable to schedule and requested me to call back at another time. Will reach out to pt. Later today.  Total time spent: 3 min.   LM-11/17/21-Called pt. Back to schedule CP initial visit. Unable to reach pt. VM box not set up. Will try calling at another time. Total time spent: 2 min

## 2021-11-18 NOTE — Telephone Encounter (Signed)
LM-11/18/21-Calling pt. To r/s initial CP visit.Unable to reach pt. VM box is full, unable to LVM.  Total time spent: 2 min.

## 2021-12-23 ENCOUNTER — Encounter: Payer: Self-pay | Admitting: Nurse Practitioner

## 2021-12-23 ENCOUNTER — Ambulatory Visit (INDEPENDENT_AMBULATORY_CARE_PROVIDER_SITE_OTHER): Payer: PPO | Admitting: Nurse Practitioner

## 2021-12-23 VITALS — BP 146/82 | HR 53 | Temp 97.7°F | Ht 65.0 in | Wt 155.0 lb

## 2021-12-23 DIAGNOSIS — R7309 Other abnormal glucose: Secondary | ICD-10-CM

## 2021-12-23 DIAGNOSIS — E782 Mixed hyperlipidemia: Secondary | ICD-10-CM | POA: Diagnosis not present

## 2021-12-23 DIAGNOSIS — E559 Vitamin D deficiency, unspecified: Secondary | ICD-10-CM

## 2021-12-23 DIAGNOSIS — Z79899 Other long term (current) drug therapy: Secondary | ICD-10-CM | POA: Diagnosis not present

## 2021-12-23 DIAGNOSIS — I10 Essential (primary) hypertension: Secondary | ICD-10-CM

## 2021-12-23 DIAGNOSIS — R7303 Prediabetes: Secondary | ICD-10-CM | POA: Diagnosis not present

## 2021-12-23 DIAGNOSIS — D497 Neoplasm of unspecified behavior of endocrine glands and other parts of nervous system: Secondary | ICD-10-CM

## 2021-12-23 DIAGNOSIS — R42 Dizziness and giddiness: Secondary | ICD-10-CM | POA: Diagnosis not present

## 2021-12-23 NOTE — Patient Instructions (Signed)

## 2021-12-23 NOTE — Progress Notes (Signed)
Assessment and Plan:  Kristen [name] was seen today for an eposodic visit.  Diagnoses and all order for this visit:  1. Essential hypertension Discussed DASH (Dietary Approaches to Stop Hypertension) DASH diet is lower in sodium than a typical American diet. Cut back on foods that are high in saturated fat, cholesterol, and trans fats. Eat more whole-grain foods, fish, poultry, and nuts Remain active and exercise as tolerated daily.  Monitor BP at home-Call if greater than 130/80.   - CBC with Differential/Platelet - COMPLETE METABOLIC PANEL WITH GFR  2. Pituitary tumor (excised 2013) Discussed further imaging and follow up if work is in office is negative.  3. Prediabetes Possible hypoglycemic episodes.  - Hemoglobin A1c  4. Abnormal glucose Possible hypoglycemic episodes.  5. Medication management All medications discussed and reviewed in full. All questions and concerns regarding medications addressed.    - CBC with Differential/Platelet - COMPLETE METABOLIC PANEL WITH GFR - TSH - Hemoglobin A1c - Lipid panel - Vitamin B12 - VITAMIN D 25 Hydroxy (Vit-D Deficiency, Fractures)  6. Hyperlipidemia, mixed Discussed lifestyle modifications. Recommended diet heavy in fruits and veggies, omega 3's. Decrease consumption of animal meats, cheeses, and dairy products. Remain active and exercise as tolerated. Continue to monitor.  - Lipid panel  7. Vitamin D deficiency  - VITAMIN D 25 Hydroxy (Vit-D Deficiency, Fractures)  8. Dizziness Change positions slowly to avoid falls. Stay well hydrated.  - CBC with Differential/Platelet - TSH - Vitamin B12  Notify office for further evaluation and treatment, questions or concerns if s/s fail to improve. The risks and benefits of my recommendations, as well as other treatment options were discussed with the patient today. Questions were answered.  Further disposition pending results of labs. Discussed med's effects and  SE's.    Over 20 minutes of exam, counseling, chart review, and critical decision making was performed.   Future Appointments  Date Time Provider [place]  01/24/2022 10:00 AM Unk Pinto, MD [facility] None  05/04/2022 11:00 AM Darrol Jump, NP [facility] None    ------------------------------------------------------------------------------------------------------------------   HPI BP (!) 146/82   Pulse (!) 53   Temp 97.7 F (36.5 C)   Ht '5\' 5"'$  (1.651 m)   Wt 155 lb (70.3 kg)   SpO2 98%   BMI 25.79 kg/m   Patient presents to clinic accompanied by her friend for on going dizziness and symptoms of vertigo.  Lasted approximately 1 mo.  She has been taking Meclizine with minimal effect.  Denies any recent ear pain or drainage, travel.  Denies fever, chills N/V, heart palpitations or blood in stool.  She has a hx of a pituitary tumor that is currently being evaluated.  She reports tripping over her foot stool when trying to move from the chair.  She did not have a syncopal episode.     Past Medical History:  Diagnosis Date   Arthritis    Colon cancer ([place]) 1990s, 2014   Colon polyps    Depression    Dysrhythmia 04/06/2012   GERD (gastroesophageal reflux disease)    Headache(784.0)    hx migraines   Hypertension    pt states she has not taken any b/p medication since her brain surgery in oct 3013--took herself off the medication   Pituitary mass ([place]) 04/06/2012   MASS FOUND AFTER PT EXPERIENCED BILATERAL BLURRED / HAZY VISION -ESP LEFT EYE.  S/P CRANIOTOMY AND EYESIGHT RETURNED TO NORMAL--PT STILL HAS A LOT OF TENDERNESS RIGHT SIDE OF NOSE --THE CRANIOTOMY  WAS DONE BY TRANSNASAL APPROACH   Pneumonia yrs ago   hx of pneumonia   PONV (postoperative nausea and vomiting)    PT STATES PLEASE NOTE SHE HAS TENDERNESS RIGHT SIDE OF NOSE - SINCE TRANSNASAL CRANIOTOMY 04/2012   Prediabetes    Vitamin D deficiency      Allergies  Allergen Reactions   Aspirin Other  (See Comments)    High Doses stomach trouble   Codeine Nausea And Vomiting   Fenofibrate Cough   Hydrochlorothiazide Other (See Comments)    Fatigue   Metoclopramide Other (See Comments)    Sedation    Current Outpatient Medications on File Prior to Visit  Medication Sig   amLODipine (NORVASC) 2.5 MG tablet Take  1 tablet  Daily  for BP / Patient knows to take by mouth   atorvastatin (LIPITOR) 40 MG tablet Take  1 tablet  Daily  for Cholesterol   bisoprolol-hydrochlorothiazide (ZIAC) 2.5-6.25 MG tablet Take  1 tablet  Daily  for BP   CHOLECALCIFEROL PO Take 5,000 Units by mouth in the morning, at noon, and at bedtime.    Homeopathic Products (GELSEMIUM HOMACCORD PO) Take by mouth. Takes two pellets a day   meclizine (ANTIVERT) 25 MG tablet Take 1 tablet 2 to 3 x /day as needed for Vertigo                                          /                        TAKE                     BY                         MOUTH   olmesartan (BENICAR) 40 MG tablet Take  1 tablet  Every Night for BP / Patient knows to take by mouth   PARoxetine (PAXIL) 30 MG tablet Take  1 tablet  Daily  for Mood  /   Patient knows to take by mouth   aspirin EC 81 MG tablet Take 81 mg by mouth daily. (Patient not taking: Reported on 12/23/2021)   gabapentin (NEURONTIN) 600 MG tablet Take  1/2 to 1 tablet  2 to 3 x /day  for Neuropathy pains (Patient not taking: Reported on 12/23/2021)   meloxicam (MOBIC) 15 MG tablet Take 1/2 to 1 tablet daily with  Food for pain  & Inflammation- try to limit to max 5 days   /week (Patient not taking: Reported on 12/23/2021)   pantoprazole (PROTONIX) 40 MG tablet Take  1 tablet  Daily  to Prevent Indigestion & Heartburn  /  Patient knows to take by mouth (Patient not taking: Reported on 12/23/2021)   No current facility-administered medications on file prior to visit.    ROS: all negative except what is noted in the HPI.     Physical Exam:  BP (!) 146/82   Pulse (!) 53   Temp 97.7 F  (36.5 C)   Ht '5\' 5"'$  (1.651 m)   Wt 155 lb (70.3 kg)   SpO2 98%   BMI 25.79 kg/m   General Appearance: NAD.  Awake, conversant and cooperative. Eyes: PERRLA, EOMs intact.  Sclera white.  Conjunctiva without erythema. Sinuses: No frontal/maxillary tenderness.  No nasal discharge. Nares patent.  ENT/Mouth: Ext aud canals clear.  Bilateral TMs w/DOL and without erythema or bulging. Hearing intact.  Posterior pharynx without swelling or exudate.  Tonsils without swelling or erythema.  Neck: Supple.  No masses, nodules or thyromegaly. Respiratory: Effort is regular with non-labored breathing. Breath sounds are equal bilaterally without rales, rhonchi, wheezing or stridor.  Cardio: RRR with no MRGs. Brisk peripheral pulses without edema.  Abdomen: Active BS in all four quadrants.  Soft and non-tender without guarding, rebound tenderness, hernias or masses. Lymphatics: Non tender without lymphadenopathy.  Musculoskeletal: Full ROM, 5/5 strength, normal ambulation.  No clubbing or cyanosis. Skin: Appropriate color for ethnicity. Warm without rashes, lesions, ecchymosis, ulcers.  Neuro: CN II-XII grossly normal. Normal muscle tone without cerebellar symptoms and intact sensation.   Psych: AO X 3,  appropriate mood and affect, insight and judgment.     Darrol Jump, NP 3:58 PM [facility]

## 2021-12-24 LAB — COMPLETE METABOLIC PANEL WITH GFR
AG Ratio: 2 (calc) (ref 1.0–2.5)
ALT: 22 U/L (ref 6–29)
AST: 20 U/L (ref 10–35)
Albumin: 4.6 g/dL (ref 3.6–5.1)
Alkaline phosphatase (APISO): 73 U/L (ref 37–153)
BUN: 9 mg/dL (ref 7–25)
CO2: 27 mmol/L (ref 20–32)
Calcium: 10 mg/dL (ref 8.6–10.4)
Chloride: 106 mmol/L (ref 98–110)
Creat: 0.7 mg/dL (ref 0.60–0.95)
Globulin: 2.3 g/dL (calc) (ref 1.9–3.7)
Glucose, Bld: 91 mg/dL (ref 65–99)
Potassium: 4.8 mmol/L (ref 3.5–5.3)
Sodium: 141 mmol/L (ref 135–146)
Total Bilirubin: 0.7 mg/dL (ref 0.2–1.2)
Total Protein: 6.9 g/dL (ref 6.1–8.1)
eGFR: 87 mL/min/{1.73_m2} (ref >=60)

## 2021-12-24 LAB — HEMOGLOBIN A1C
Hgb A1c MFr Bld: 5.6 % of total Hgb (ref <5.7)
Mean Plasma Glucose: 114 mg/dL
eAG (mmol/L): 6.3 mmol/L

## 2021-12-24 LAB — TEST AUTHORIZATION: TEST CODE:: 5616

## 2021-12-24 LAB — CBC WITH DIFFERENTIAL/PLATELET
Absolute Monocytes: 341 cells/uL (ref 200–950)
Basophils Absolute: 43 cells/uL (ref 0–200)
Basophils Relative: 0.7 %
Eosinophils Absolute: 174 cells/uL (ref 15–500)
Eosinophils Relative: 2.8 %
HCT: 36.6 % (ref 35.0–45.0)
Hemoglobin: 12.5 g/dL (ref 11.7–15.5)
Lymphs Abs: 1525 cells/uL (ref 850–3900)
MCH: 29.3 pg (ref 27.0–33.0)
MCHC: 34.2 g/dL (ref 32.0–36.0)
MCV: 85.7 fL (ref 80.0–100.0)
MPV: 10.7 fL (ref 7.5–12.5)
Monocytes Relative: 5.5 %
Neutro Abs: 4117 cells/uL (ref 1500–7800)
Neutrophils Relative %: 66.4 %
Platelets: 235 10*3/uL (ref 140–400)
RBC: 4.27 10*6/uL (ref 3.80–5.10)
RDW: 13 % (ref 11.0–15.0)
Total Lymphocyte: 24.6 %
WBC: 6.2 10*3/uL (ref 3.8–10.8)

## 2021-12-24 LAB — LIPID PANEL
Cholesterol: 145 mg/dL (ref <200)
HDL: 44 mg/dL — ABNORMAL LOW (ref >=50)
LDL Cholesterol (Calc): 79 mg/dL (calc)
Non-HDL Cholesterol (Calc): 101 mg/dL (calc) (ref <130)
Total CHOL/HDL Ratio: 3.3 (calc) (ref <5.0)
Triglycerides: 127 mg/dL (ref <150)

## 2021-12-24 LAB — TSH: TSH: 1.67 mIU/L (ref 0.40–4.50)

## 2021-12-24 LAB — VITAMIN D 25 HYDROXY (VIT D DEFICIENCY, FRACTURES): Vit D, 25-Hydroxy: 56 ng/mL (ref 30–100)

## 2021-12-24 LAB — VITAMIN B12: Vitamin B-12: 262 pg/mL (ref 200–1100)

## 2022-01-23 ENCOUNTER — Encounter: Payer: Self-pay | Admitting: Internal Medicine

## 2022-01-23 NOTE — Progress Notes (Unsigned)
Annual Screening/Preventative Visit & Comprehensive Evaluation &  Examination   Future Appointments  Date Time Provider Department  01/24/2022                   cpe 10:00 AM Unk Pinto, MD [facility]  05/04/2022                 wellness 11:00 AM Darrol Jump, NP [facility]  01/30/2023                   cpe 10:00 AM Unk Pinto, MD [facility]        This very nice 81 y.o. WWF presents for a Screening /Preventative Visit & comprehensive evaluation and management of multiple medical co-morbidities.  Patient has been followed for HTN, HLD, Prediabetes  and Vitamin D Deficiency. Patient's GERD is stable /controlled on her meds.         HTN predates circa  1999.  Patient had a  Negative Cardiolite in 2011. Patient's BP has been controlled at home and patient denies any cardiac symptoms as chest pain, palpitations, shortness of breath, dizziness or ankle swelling. Today's BP is                                   .        Patient's hyperlipidemia is controlled with diet and Atorvastatin. Patient denies myalgias or other medication SE's. Last lipids were at goal :  Lab Results  Component Value Date   CHOL 145 12/23/2021   HDL 44 (L) 12/23/2021   LDLCALC 79 12/23/2021   TRIG 127 12/23/2021   CHOLHDL 3.3 12/23/2021         Patient has hx/o prediabetes (A1c 5.9% /Insulin 90 /2011) and patient denies reactive hypoglycemic symptoms, visual blurring, diabetic polys or paresthesias. Last A1c was  at goal :  Lab Results  Component Value Date   HGBA1C 5.6 12/23/2021         Finally, patient has history of Vitamin D Deficiency ("31" /2008) and last Vitamin D was  near goal  ( 70-100):  Lab Results  Component Value Date   VD25OH 56 12/23/2021      Current Outpatient Medications:    amLODipine (NORVASC) 2.5 MG tablet, Take  1 tablet  Daily  for BP / Patient knows to take by mouth, Disp: 90 tablet, Rfl: 3   atorvastatin (LIPITOR) 40 MG tablet, Take  1 tablet  Daily  for  Cholesterol, Disp: 90 tablet, Rfl: 3   bisoprolol-hydrochlorothiazide (ZIAC) 2.5-6.25 MG tablet, Take  1 tablet  Daily  for BP, Disp: 90 tablet, Rfl: 3   CHOLECALCIFEROL PO, Take 5,000 Units by mouth in the morning, at noon, and at bedtime. , Disp: , Rfl:    Homeopathic Products (GELSEMIUM HOMACCORD PO), Take by mouth. Takes two pellets a day, Disp: , Rfl:    meclizine (ANTIVERT) 25 MG tablet, Take 1 tablet 2 to 3 x /day as needed for Vertigo                                          /                        TAKE  BY                         MOUTH, Disp: 270 tablet, Rfl: 3   olmesartan (BENICAR) 40 MG tablet, Take  1 tablet  Every Night for BP / Patient knows to take by mouth, Disp: 90 tablet, Rfl: 3   PARoxetine (PAXIL) 30 MG tablet, Take  1 tablet  Daily  for Mood  /   Patient knows to take by mouth, Disp: 90 tablet, Rfl: 3    Allergies  Allergen Reactions   Aspirin Other (See Comments)    High Doses stomach trouble   Codeine Nausea And Vomiting   Fenofibrate Cough   Hydrochlorothiazide Other (See Comments)    Fatigue   Metoclopramide Other (See Comments)    Sedation     Past Medical History:  Diagnosis Date   Arthritis    Colon cancer ([place])  2014   Colon polyps    Depression    Dysrhythmia 11/2013on 11/29 w/ Tonya     SVT - RATE 145 POST OP CRANIOTOMY SURGERY --TX'D AND RESOLVED   GERD (gastroesophageal reflux disease)    Headache(784.0)    hx migraines   Hypertension       Pituitary mass ([place]) NOV 2013   MASS FOUND AFTER PT EXPERIENCED BILATERAL BLURRED / HAZY VISION -ESP LEFT EYE.  S/P CRANIOTOMY AND EYESIGHT RETURNED TO NORMAL--PT STILL HAS A LOT OF TENDERNESS RIGHT SIDE OF NOSE --THE CRANIOTOMY WAS DONE BY TRANSNASAL APPROACH   Pneumonia yrs ago   hx of pneumonia   PONV (postoperative nausea and vomiting)    PT STATES PLEASE NOTE SHE HAS TENDERNESS RIGHT SIDE OF NOSE - SINCE TRANSNASAL CRANIOTOMY 04/2012   Prediabetes    Vitamin D deficiency       Health Maintenance  Topic Date Due   COVID-19 Vaccine (1) Never done   Zoster Vaccines- Shingrix (1 of 2) Never done   INFLUENZA VACCINE  01/04/2021   TETANUS/TDAP  11/30/2025   DEXA SCAN  Completed   PNA vac Low Risk Adult  Completed   HPV VACCINES  Aged Out     Immunization History  Administered Date(s) Administered   Influenza Split 04/19/2012   Influenza, High Dose Seasonal PF 04/17/2013, 03/12/2014, 03/03/2017, 04/12/2018, 04/17/2019, 04/20/2020   Pneumococcal Conjugate-13 09/30/2014   Pneumococcal Polysaccharide-23 04/19/2012   Td 07/20/2005, 12/01/2015     Last Colon - 07/03/2018 - Dr Paulita Fujita - recc no F/U due to age     Last MGM - 10/2013 - patient was encouraged to schedule MGM  for the  last 3 years   Past Surgical History:  Procedure Laterality Date   BACK SURGERY     lower   CATARACT EXTRACTION  June/july 2013   bilateral eyes   COLON RESECTION  1988   COLON SURGERY  1990's   COLONOSCOPY  09/20/2011   Procedure: COLONOSCOPY;  Surgeon: Missy Sabins, MD;  Location: [facility];  Service: Endoscopy;  Laterality: N/A;   COLONOSCOPY  12/15/2011   Procedure: COLONOSCOPY;  Surgeon: Missy Sabins, MD;  Location: [facility];  Service: Endoscopy;  Laterality: N/A;   COLONOSCOPY  01/26/2012   Procedure: COLONOSCOPY;  Surgeon: Cleotis Nipper, MD;  Location: [facility];  Service: Endoscopy;  Laterality: N/A;   CRANIOTOMY  04/18/2012   Procedure: CRANIOTOMY HYPOPHYSECTOMY TRANSNASAL APPROACH;  Surgeon: Ophelia Charter, MD;  Location: [facility];  Service: Neurosurgery;  Laterality: Bilateral;  Transphenoidal resection of  Pituitary tumor.    CYSTOSCOPY WITH URETEROSCOPY AND STENT PLACEMENT Bilateral 10/01/2015   Procedure: CYSTOSCOPY, retrograde WITH URETEROSCOPY AND STENT PLACEMENT, ;  Surgeon: Raynelle Bring, MD;  Location: [facility];  Service: Urology;  Laterality: Bilateral;   CYSTOSCOPY WITH URETEROSCOPY AND STENT PLACEMENT Right 10/19/2015   Procedure:  CYSTOSCOPY,  RIGHT URETEROSCOPY, HOLMIUM LASER RIGHT URETERAL STONE, RIGHT URETERAL STENT PLACEMENT, LEFT URETEROSCOPY, INSERTION LEFT URETERAL STENT;  Surgeon: Raynelle Bring, MD;  Location: [facility];  Service: Urology;  Laterality: Right;   EYE SURGERY     cataract bil   HOLMIUM LASER APPLICATION Right 12/07/5007   Procedure: HOLMIUM LASER  ;  Surgeon: Raynelle Bring, MD;  Location: [facility];  Service: Urology;  Laterality: Right;   HOT HEMOSTASIS  09/20/2011   Procedure: HOT HEMOSTASIS (ARGON PLASMA COAGULATION/BICAP);  Surgeon: Missy Sabins, MD;  Location: [facility];  Service: Endoscopy;  Laterality: N/A;   LAPAROSCOPIC RIGHT HEMI COLECTOMY  06/27/2012   Procedure: LAPAROSCOPIC RIGHT HEMI COLECTOMY;  Surgeon: Pedro Earls, MD;  Location: [facility];  Service: General;  Laterality: N/A;  Laparoscopic Assited Right Hemicolectomy   TRANSNASAL APPROACH  04/18/2012   Procedure: TRANSNASAL APPROACH;  Surgeon: Rozetta Nunnery, MD;  Location: [facility];  Service: ENT;  Laterality: Bilateral;  Transnasal approach   TUMOR REMOVAL     Left leg     Family History  Problem Relation Age of Onset   Colon cancer Sister 42   Heart disease Mother    Heart disease Father    Cancer Maternal Aunt        unknown form of cancer dx in her 63s-50s   Liver cancer Maternal Uncle        heavy ETOH user   Breast cancer Sister 57   Dementia Sister    Colon cancer Cousin    Colon polyps Son    Colon polyps Son    Colon cancer Other 32   Colon polyps Other    Brain cancer Cousin        dx in his 82s; paternal cousin     Social History   Tobacco Use   Smoking status: Never   Smokeless tobacco: Never  Vaping Use   Vaping Use: Never used  Substance Use Topics   Alcohol use: No    Comment: a couple times a year, one drink at a time   Drug use: No      ROS Constitutional: Denies fever, chills, weight loss/gain, headaches, insomnia,  night sweats, and change in appetite. Does c/o fatigue. Eyes:  Denies redness, blurred vision, diplopia, discharge, itchy, watery eyes.  ENT: Denies discharge, congestion, post nasal drip, epistaxis, sore throat, earache, hearing loss, dental pain, Tinnitus, Vertigo, Sinus pain, snoring.  Cardio: Denies chest pain, palpitations, irregular heartbeat, syncope, dyspnea, diaphoresis, orthopnea, PND, claudication, edema Respiratory: denies cough, dyspnea, DOE, pleurisy, hoarseness, laryngitis, wheezing.  Gastrointestinal: Denies dysphagia, heartburn, reflux, water brash, pain, cramps, nausea, vomiting, bloating, diarrhea, constipation, hematemesis, melena, hematochezia, jaundice, hemorrhoids Genitourinary: Denies dysuria, frequency, urgency, nocturia, hesitancy, discharge, hematuria, flank pain Breast: Breast lumps, nipple discharge, bleeding.  Musculoskeletal: Denies arthralgia, myalgia, stiffness, Jt. Swelling, pain, limp, and strain/sprain. Denies falls. Skin: Denies puritis, rash, hives, warts, acne, eczema, changing in skin lesion Neuro: No weakness, tremor, incoordination, spasms, paresthesia, pain Psychiatric: Denies confusion, memory loss, sensory loss. Denies Depression. Endocrine: Denies change in weight, skin, hair change, nocturia, and paresthesia, diabetic polys, visual blurring, hyper / hypo glycemic episodes.  Heme/Lymph: No excessive bleeding,  bruising, enlarged lymph nodes.  Physical Exam  There were no vitals taken for this visit.  General Appearance: Well nourished, well groomed and in no apparent distress.  Eyes: PERRLA, EOMs, conjunctiva no swelling or erythema, normal fundi and vessels. Sinuses: No frontal/maxillary tenderness ENT/Mouth: EACs patent / TMs  nl. Nares clear without erythema, swelling, mucoid exudates. Oral hygiene is good. No erythema, swelling, or exudate. Tongue normal, non-obstructing. Tonsils not swollen or erythematous. Hearing normal.  Neck: Supple, thyroid not palpable. No bruits, nodes or JVD. Respiratory:  Respiratory effort normal.  BS equal and clear bilateral without rales, rhonci, wheezing or stridor. Cardio: Heart sounds are normal with regular rate and rhythm and no murmurs, rubs or gallops. Peripheral pulses are normal and equal bilaterally without edema. No aortic or femoral bruits. Chest: symmetric with normal excursions and percussion. Breasts: Symmetric, without lumps, nipple discharge, retractions, or fibrocystic changes.  Abdomen: Flat, soft with bowel sounds active. Nontender, no guarding, rebound, hernias, masses, or organomegaly.  Lymphatics: Non tender without lymphadenopathy.  Genitourinary:  Musculoskeletal: Full ROM all peripheral extremities, joint stability, 5/5 strength, and normal gait. Skin: Warm and dry without rashes, lesions, cyanosis, clubbing or  ecchymosis.  Neuro: Cranial nerves intact, reflexes equal bilaterally. Normal muscle tone, no cerebellar symptoms. Sensation intact.  Pysch: Alert and oriented X 3, normal affect, Insight and Judgment appropriate.    Assessment and Plan  1. Annual Preventative Screening Examination   2. Essential hypertension  - EKG 12-Lead - Urinalysis, Routine w reflex microscopic - Microalbumin / creatinine urine ratio - CBC with Differential/Platelet - COMPLETE METABOLIC PANEL WITH GFR - Magnesium  3. Hyperlipidemia, mixed  - EKG 12-Lead - Lipid panel - TSH  4. Abnormal glucose  - EKG 12-Lead - Hemoglobin A1c - Insulin, random  5. Vitamin D deficiency  - VITAMIN D 25 Hydroxy  6. Gastroesophageal reflux disease  - CBC with Differential/Platelet  7. Screening for colorectal cancer  - POC Hemoccult Bld/Stl  8. History of colon cancer, stage II  - POC Hemoccult Bld/Stl   9. Screening for heart disease  - EKG 12-Lead  10. FHx: heart disease  - EKG 12-Lead  11. Medication management  - Urinalysis, Routine w reflex microscopic - Microalbumin / creatinine urine ratio - CBC with  Differential/Platelet - COMPLETE METABOLIC PANEL WITH GFR - Magnesium - Lipid panel - TSH - Hemoglobin A1c - Insulin, random - VITAMIN D 25 Hydroxy            Patient was counseled in prudent diet to achieve/maintain BMI less than 25 for weight control, BP monitoring, regular exercise and medications. Discussed med's effects and SE's. Screening labs and tests as requested with regular follow-up as recommended. Over 40 minutes of exam, counseling, chart review and high complex critical decision making was performed.   Kirtland Bouchard, MD

## 2022-01-24 ENCOUNTER — Ambulatory Visit: Payer: Self-pay | Admitting: Internal Medicine

## 2022-01-24 DIAGNOSIS — R7309 Other abnormal glucose: Secondary | ICD-10-CM

## 2022-01-24 DIAGNOSIS — Z8249 Family history of ischemic heart disease and other diseases of the circulatory system: Secondary | ICD-10-CM

## 2022-01-24 DIAGNOSIS — K219 Gastro-esophageal reflux disease without esophagitis: Secondary | ICD-10-CM

## 2022-01-24 DIAGNOSIS — Z136 Encounter for screening for cardiovascular disorders: Secondary | ICD-10-CM

## 2022-01-24 DIAGNOSIS — Z79899 Other long term (current) drug therapy: Secondary | ICD-10-CM

## 2022-01-24 DIAGNOSIS — Z0001 Encounter for general adult medical examination with abnormal findings: Secondary | ICD-10-CM

## 2022-01-24 DIAGNOSIS — E559 Vitamin D deficiency, unspecified: Secondary | ICD-10-CM

## 2022-01-24 DIAGNOSIS — Z1211 Encounter for screening for malignant neoplasm of colon: Secondary | ICD-10-CM

## 2022-01-24 DIAGNOSIS — Z85038 Personal history of other malignant neoplasm of large intestine: Secondary | ICD-10-CM

## 2022-01-24 DIAGNOSIS — I10 Essential (primary) hypertension: Secondary | ICD-10-CM

## 2022-01-24 DIAGNOSIS — E782 Mixed hyperlipidemia: Secondary | ICD-10-CM

## 2022-01-26 ENCOUNTER — Encounter: Payer: Self-pay | Admitting: Internal Medicine

## 2022-01-30 NOTE — Patient Instructions (Signed)
Due to recent changes in healthcare laws, you Dudash see the results of your imaging and laboratory studies on MyChart before your provider has had a chance to review them.  We understand that in some cases there Gin be results that are confusing or concerning to you. Not all laboratory results come back in the same time frame and the provider Hoback be waiting for multiple results in order to interpret others.  Please give [place] 48 hours in order for your provider to thoroughly review all the results before contacting the office for clarification of your results.   +++++++++++++++++++++++++  Vit D  & Vit C 1,000 mg   are recommended to help protect  against the Covid-19 and other Corona viruses.    Also it's recommended  to take  Zinc 50 mg  to help  protect against the Covid-19   and best place to get  is also on [email or web address]  and don't pay more than 6-8 cents /pill !  ================================ Coronavirus (COVID-19) Are you at risk?  Are you at risk for the Coronavirus (COVID-19)?  To be considered HIGH RISK for Coronavirus (COVID-19), you have to meet the following criteria:  Traveled to [place], [place], [place], [place] or [place]; or in the [place] to [place], [place], [place]  or [place]; and have fever, cough, and shortness of breath within the last 2 weeks of travel OR Been in close contact with a person diagnosed with COVID-19 within the last 2 weeks and have  fever, cough,and shortness of breath  IF YOU DO NOT MEET THESE CRITERIA, YOU ARE CONSIDERED LOW RISK FOR COVID-19.  What to do if you are HIGH RISK for COVID-19?  If you are having a medical emergency, call 911. Seek medical care right away. Before you go to a doctor's office, urgent care or emergency department,  call ahead and tell them about your recent travel, contact with someone diagnosed with COVID-19   and your symptoms.  You should receive instructions from your physician's office regarding next  steps of care.  When you arrive at healthcare provider, tell the healthcare staff immediately you have returned from  visiting [place], [place], [place], [place] or [place]; or traveled in the [place] to [place], [place],  [place] or [place] in the last two weeks or you have been in close contact with a person diagnosed with  COVID-19 in the last 2 weeks.   Tell the health care staff about your symptoms: fever, cough and shortness of breath. After you have been seen by a medical provider, you will be either: Tested for (COVID-19) and discharged home on quarantine except to seek medical care if  symptoms worsen, and asked to  Stay home and avoid contact with others until you get your results (4-5 days)  Avoid travel on public transportation if possible (such as bus, train, or airplane) or Sent to the Emergency Department by EMS for evaluation, COVID-19 testing  and  possible admission depending on your condition and test results.  What to do if you are LOW RISK for COVID-19?  Reduce your risk of any infection by using the same precautions used for avoiding the common cold or flu:  Wash your hands often with soap and warm water for at least 20 seconds.  If soap and water are not readily available,  use an alcohol-based hand sanitizer with at least 60% alcohol.  If coughing or sneezing, cover your mouth and nose by coughing  or sneezing into the elbow areas of your shirt or coat,  into a tissue or into your sleeve (not your hands). Avoid shaking hands with others and consider head nods or verbal greetings only. Avoid touching your eyes, nose, or mouth with unwashed hands.  Avoid close contact with people who are sick. Avoid places or events with large numbers of people in one location, like concerts or sporting events. Carefully consider travel plans you have or are making. If you are planning any travel outside or inside the [place], visit the CDC's Travelers' Health webpage for the  latest health notices. If you have some symptoms but not all symptoms, continue to monitor at home and seek medical attention  if your symptoms worsen. If you are having a medical emergency, call 911.   >>>>>>>>>>>>>>>>>>>>>>>>>>>>>>>>>  We Do NOT Approve of [employer] > > > > > > > > > > > > > > > > > > > > > > > > > > > > > > > > > > > > > > >  Preventive Care for Adults  A healthy lifestyle and preventive care can promote health and wellness. Preventive health guidelines for women include the following key practices. A routine yearly physical is a good way to check with your health care provider about your health and preventive screening. It is a chance to share any concerns and updates on your health and to receive a thorough exam. Visit your dentist for a routine exam and preventive care every 6 months. Brush your teeth twice a day and floss once a day. Good oral hygiene prevents tooth decay and gum disease. The frequency of eye exams is based on your age, health, family medical history, use of contact lenses, and other factors. Follow your health care provider's recommendations for frequency of eye exams. Eat a healthy diet. Foods like vegetables, fruits, whole grains, low-fat dairy products, and lean protein foods contain the nutrients you need without too many calories. Decrease your intake of foods high in solid fats, added sugars, and salt. Eat the right amount of calories for you. Get information about a proper diet from your health care provider, if necessary. Regular physical exercise is one of the most important things you can do for your health. Most adults should get at least 150 minutes of moderate-intensity exercise (any activity that increases your heart rate and causes you to sweat) each week. In addition, most adults need muscle-strengthening exercises on 2 or more days a week. Maintain a healthy weight. The body mass index (BMI) is a screening tool to identify  possible weight problems. It provides an estimate of body fat based on height and weight. Your health care provider can find your BMI and can help you achieve or maintain a healthy weight. For adults 20 years and older: A BMI below 18.5 is considered underweight. A BMI of 18.5 to 24.9 is normal. A BMI of 25 to 29.9 is considered overweight. A BMI of 30 and above is considered obese. Maintain normal blood lipids and cholesterol levels by exercising and minimizing your intake of saturated fat. Eat a balanced diet with plenty of fruit and vegetables. If your lipid or cholesterol levels are high, you are over 50, or you are at high risk for heart disease, you may need your cholesterol levels checked more frequently. Ongoing high lipid and cholesterol levels should be treated with medicines if diet and exercise are not working. If you smoke, find out from  your health care provider how to quit. If you do not use tobacco, do not start. Lung cancer screening is recommended for adults aged 55-80 years who are at high risk for developing lung cancer because of a history of smoking. A yearly low-dose CT scan of the lungs is recommended for people who have at least a 30-pack-year history of smoking and are a current smoker or have quit within the past 15 years. A pack year of smoking is smoking an average of 1 pack of cigarettes a day for 1 year (for example: 1 pack a day for 30 years or 2 packs a day for 15 years). Yearly screening should continue until the smoker has stopped smoking for at least 15 years. Yearly screening should be stopped for people who develop a health problem that would prevent them from having lung cancer treatment. Avoid use of street drugs. Do not share needles with anyone. Ask for help if you need support or instructions about stopping the use of drugs. High blood pressure causes heart disease and increases the risk of stroke.  Ongoing high blood pressure should be treated with medicines if  weight loss and exercise do not work. If you are 55-79 years old, ask your health care provider if you should take aspirin to prevent strokes. Diabetes screening involves taking a blood sample to check your fasting blood sugar level. This should be done once every 3 years, after age 45, if you are within normal weight and without risk factors for diabetes. Testing should be considered at a younger age or be carried out more frequently if you are overweight and have at least 1 risk factor for diabetes. Breast cancer screening is essential preventive care for women. You should practice "breast self-awareness." This means understanding the normal appearance and feel of your breasts and may include breast self-examination. Any changes detected, no matter how small, should be reported to a health care provider. Women in their 20s and 30s should have a clinical breast exam (CBE) by a health care provider as part of a regular health exam every 1 to 3 years. After age 40, women should have a CBE every year. Starting at age 40, women should consider having a mammogram (breast X-ray test) every year. Women who have a family history of breast cancer should talk to their health care provider about genetic screening. Women at a high risk of breast cancer should talk to their health care providers about having an MRI and a mammogram every year. Breast cancer gene (BRCA)-related cancer risk assessment is recommended for women who have family members with BRCA-related cancers. BRCA-related cancers include breast, ovarian, tubal, and peritoneal cancers. Having family members with these cancers may be associated with an increased risk for harmful changes (mutations) in the breast cancer genes BRCA1 and BRCA2. Results of the assessment will determine the need for genetic counseling and BRCA1 and BRCA2 testing. Routine pelvic exams to screen for cancer are no longer recommended for nonpregnant women who are considered low risk for  cancer of the pelvic organs (ovaries, uterus, and vagina) and who do not have symptoms. Ask your health care provider if a screening pelvic exam is right for you. If you have had past treatment for cervical cancer or a condition that could lead to cancer, you need Pap tests and screening for cancer for at least 20 years after your treatment. If Pap tests have been discontinued, your risk factors (such as having a new sexual partner) need to be   reassessed to determine if screening should be resumed. Some women have medical problems that increase the chance of getting cervical cancer. In these cases, your health care provider may recommend more frequent screening and Pap tests.  Colorectal cancer can be detected and often prevented. Most routine colorectal cancer screening begins at the age of 9 years and continues through age 47 years. However, your health care provider may recommend screening at an earlier age if you have risk factors for colon cancer. On a yearly basis, your health care provider may provide home test kits to check for hidden blood in the stool. Use of a small camera at the end of a tube, to directly examine the colon (sigmoidoscopy or colonoscopy), can detect the earliest forms of colorectal cancer. Talk to your health care provider about this at age 66, when routine screening begins.  Direct exam of the colon should be repeated every 5-10 years through age 98 years, unless early forms of pre-cancerous polyps or small growths are found. Osteoporosis is a disease in which the bones lose minerals and strength with aging. This can result in serious bone fractures or breaks. The risk of osteoporosis can be identified using a bone density scan. Women ages 48 years and over and women at risk for fractures or osteoporosis should discuss screening with their health care providers. Ask your health care provider whether you should take a calcium supplement or vitamin D to reduce the rate of  osteoporosis. Menopause can be associated with physical symptoms and risks. Hormone replacement therapy is available to decrease symptoms and risks. You should talk to your health care provider about whether hormone replacement therapy is right for you. Use sunscreen. Apply sunscreen liberally and repeatedly throughout the day. You should seek shade when your shadow is shorter than you. Protect yourself by wearing long sleeves, pants, a wide-brimmed hat, and sunglasses year round, whenever you are outdoors. Once a month, do a whole body skin exam, using a mirror to look at the skin on your back. Tell your health care provider of new moles, moles that have irregular borders, moles that are larger than a pencil eraser, or moles that have changed in shape or color. Stay current with required vaccines (immunizations). Influenza vaccine. All adults should be immunized every year. Tetanus, diphtheria, and acellular pertussis (Td, Tdap) vaccine. Pregnant women should receive 1 dose of Tdap vaccine during each pregnancy. The dose should be obtained regardless of the length of time since the last dose. Immunization is preferred during the 27th-36th week of gestation. An adult who has not previously received Tdap or who does not know her vaccine status should receive 1 dose of Tdap. This initial dose should be followed by tetanus and diphtheria toxoids (Td) booster doses every 10 years. Adults with an unknown or incomplete history of completing a 3-dose immunization series with Td-containing vaccines should begin or complete a primary immunization series including a Tdap dose. Adults should receive a Td booster every 10 years.  Zoster vaccine. One dose is recommended for adults aged 47 years or older unless certain conditions are present.  Pneumococcal 13-valent conjugate (PCV13) vaccine. When indicated, a person who is uncertain of her immunization history and has no record of immunization should receive the PCV13  vaccine. An adult aged 63 years or older who has certain medical conditions and has not been previously immunized should receive 1 dose of PCV13 vaccine. This PCV13 should be followed with a dose of pneumococcal polysaccharide (PPSV23) vaccine. The PPSV23  vaccine dose should be obtained at least 1 or more year(s) after the dose of PCV13 vaccine. An adult aged 19 years or older who has certain medical conditions and previously received 1 or more doses of PPSV23 vaccine should receive 1 dose of PCV13. The PCV13 vaccine dose should be obtained 1 or more years after the last PPSV23 vaccine dose.  Pneumococcal polysaccharide (PPSV23) vaccine. When PCV13 is also indicated, PCV13 should be obtained first. All adults aged 65 years and older should be immunized. An adult younger than age 65 years who has certain medical conditions should be immunized. Any person who resides in a nursing home or long-term care facility should be immunized. An adult smoker should be immunized. People with an immunocompromised condition and certain other conditions should receive both PCV13 and PPSV23 vaccines. People with human immunodeficiency virus (HIV) infection should be immunized as soon as possible after diagnosis. Immunization during chemotherapy or radiation therapy should be avoided. Routine use of PPSV23 vaccine is not recommended for American Indians, [place] Natives, or people younger than 65 years unless there are medical conditions that require PPSV23 vaccine. When indicated, people who have unknown immunization and have no record of immunization should receive PPSV23 vaccine. One-time revaccination 5 years after the first dose of PPSV23 is recommended for people aged 19-64 years who have chronic kidney failure, nephrotic syndrome, asplenia, or immunocompromised conditions. People who received 1-2 doses of PPSV23 before age 65 years should receive another dose of PPSV23 vaccine at age 65 years or later if at least 5 years have  passed since the previous dose. Doses of PPSV23 are not needed for people immunized with PPSV23 at or after age 65 years.  Preventive Services / Frequency  Ages 65 years and over Blood pressure check. Lipid and cholesterol check. Lung cancer screening. / Every year if you are aged 55-80 years and have a 30-pack-year history of smoking and currently smoke or have quit within the past 15 years. Yearly screening is stopped once you have quit smoking for at least 15 years or develop a health problem that would prevent you from having lung cancer treatment. Clinical breast exam.** / Every year after age 40 years.  BRCA-related cancer risk assessment.** / For women who have family members with a BRCA-related cancer (breast, ovarian, tubal, or peritoneal cancers). Mammogram.** / Every year beginning at age 40 years and continuing for as long as you are in good health. Consult with your health care provider. Pap test.** / Every 3 years starting at age 30 years through age 65 or 70 years with 3 consecutive normal Pap tests. Testing can be stopped between 65 and 70 years with 3 consecutive normal Pap tests and no abnormal Pap or HPV tests in the past 10 years. Fecal occult blood test (FOBT) of stool. / Every year beginning at age 50 years and continuing until age 75 years. You may not need to do this test if you get a colonoscopy every 10 years. Flexible sigmoidoscopy or colonoscopy.** / Every 5 years for a flexible sigmoidoscopy or every 10 years for a colonoscopy beginning at age 50 years and continuing until age 75 years. Hepatitis C blood test.** / For all people born from 1945 through 1965 and any individual with known risks for hepatitis C. Osteoporosis screening.** / A one-time screening for women ages 65 years and over and women at risk for fractures or osteoporosis. Skin self-exam. / Monthly. Influenza vaccine. / Every year. Tetanus, diphtheria, and acellular pertussis (Tdap/Td) vaccine.** /   1 dose  of Td every 10 years. Zoster vaccine.** / 1 dose for adults aged 60 years or older. Pneumococcal 13-valent conjugate (PCV13) vaccine.** / Consult your health care provider. Pneumococcal polysaccharide (PPSV23) vaccine.** / 1 dose for all adults aged 65 years and older. Screening for abdominal aortic aneurysm (AAA)  by ultrasound is recommended for people who have history of high blood pressure or who are current or former smokers. ++++++++++++++++++++ Recommend Adult Low Dose Aspirin or  coated  Aspirin 81 mg daily  To reduce risk of Colon Cancer 40 %,  Skin Cancer 26 % ,  Melanoma 46%  and  Pancreatic cancer 60% ++++++++++++++++++++ Vitamin D goal  is between 70-100.  Please make sure that you are taking your Vitamin D as directed.  It is very important as a natural anti-inflammatory  helping hair, skin, and nails, as well as reducing stroke and heart attack risk.  It helps your bones and helps with mood. It also decreases numerous cancer risks so please take it as directed.  Low Vit D is associated with a 200-300% higher risk for CANCER  and 200-300% higher risk for HEART   ATTACK  &  STROKE.   ...................................... It is also associated with higher death rate at younger ages,  autoimmune diseases like Rheumatoid arthritis, Lupus, Multiple Sclerosis.    Also many other serious conditions, like depression, Alzheimer's Dementia, infertility, muscle aches, fatigue, fibromyalgia - just to name a few. ++++++++++++++++++ Recommend the book "The END of DIETING" by Dr Joel Fuhrman  & the book "The END of DIABETES " by Dr Joel Fuhrman At [employer] - get book & Audio CD's    Being diabetic has a  300% increased risk for heart attack, stroke, cancer, and alzheimer- type vascular dementia. It is very important that you work harder with diet by avoiding all foods that are white. Avoid white rice (Paternostro & wild rice is OK), white potatoes (sweetpotatoes in moderation is OK),  White bread or wheat bread or anything made out of white flour like bagels, donuts, rolls, buns, biscuits, cakes, pastries, cookies, pizza crust, and pasta (made from white flour & egg whites) - vegetarian pasta or spinach or wheat pasta is OK. Multigrain breads like Arnold's or Pepperidge Farm, or multigrain sandwich thins or flatbreads.  Diet, exercise and weight loss can reverse and cure diabetes in the early stages.  Diet, exercise and weight loss is very important in the control and prevention of complications of diabetes which affects every system in your body, ie. Brain - dementia/stroke, eyes - glaucoma/blindness, heart - heart attack/heart failure, kidneys - dialysis, stomach - gastric paralysis, intestines - malabsorption, nerves - severe painful neuritis, circulation - gangrene & loss of a leg(s), and finally cancer and Alzheimers.    I recommend avoid fried & greasy foods,  sweets/candy, white rice ([place] or wild rice or Quinoa is OK), white potatoes (sweet potatoes are OK) - anything made from white flour - bagels, doughnuts, rolls, buns, biscuits,white and wheat breads, pizza crust and traditional pasta made of white flour & egg white(vegetarian pasta or spinach or wheat pasta is OK).  Multi-grain bread is OK - like multi-grain flat bread or sandwich thins. Avoid alcohol in excess. Exercise is also important.    Eat all the vegetables you want - avoid meat, especially red meat and dairy - especially cheese.  Cheese is the most concentrated form of trans-fats which is the worst thing to clog up our arteries. Veggie cheese is OK   which can be found in the fresh produce section at [employer] or [employer] or [employer]  +++++++++++++++++++ DASH Eating Plan  DASH stands for "Dietary Approaches to Stop Hypertension."   The DASH eating plan is a healthy eating plan that has been shown to reduce high blood pressure (hypertension). Additional health benefits may include reducing the risk of type 2  diabetes mellitus, heart disease, and stroke. The DASH eating plan may also help with weight loss. WHAT DO I NEED TO KNOW ABOUT THE DASH EATING PLAN? For the DASH eating plan, you will follow these general guidelines: Choose foods with a percent daily value for sodium of less than 5% (as listed on the food label). Use salt-free seasonings or herbs instead of table salt or sea salt. Check with your health care provider or pharmacist before using salt substitutes. Eat lower-sodium products, often labeled as "lower sodium" or "no salt added." Eat fresh foods. Eat more vegetables, fruits, and low-fat dairy products. Choose whole grains. Look for the word "whole" as the first word in the ingredient list. Choose fish  Limit sweets, desserts, sugars, and sugary drinks. Choose heart-healthy fats. Eat veggie cheese  Eat more home-cooked food and less restaurant, buffet, and fast food. Limit fried foods. Cook foods using methods other than frying. Limit canned vegetables. If you do use them, rinse them well to decrease the sodium. When eating at a restaurant, ask that your food be prepared with less salt, or no salt if possible.                      WHAT FOODS CAN I EAT? Read Dr Joel Fuhrman's books on The End of Dieting & The End of Diabetes  Grains Whole grain or whole wheat bread. Detty rice. Whole grain or whole wheat pasta. Quinoa, bulgur, and whole grain cereals. Low-sodium cereals. Corn or whole wheat flour tortillas. Whole grain cornbread. Whole grain crackers. Low-sodium crackers.  Vegetables Fresh or frozen vegetables (raw, steamed, roasted, or grilled). Low-sodium or reduced-sodium tomato and vegetable juices. Low-sodium or reduced-sodium tomato sauce and paste. Low-sodium or reduced-sodium canned vegetables.   Fruits All fresh, canned (in natural juice), or frozen fruits.  Protein Products  All fish and seafood.  Dried beans, peas, or lentils. Unsalted nuts and seeds. Unsalted  canned beans.  Dairy Low-fat dairy products, such as skim or 1% milk, 2% or reduced-fat cheeses, low-fat ricotta or cottage cheese, or plain low-fat yogurt. Low-sodium or reduced-sodium cheeses.  Fats and Oils Tub margarines without trans fats. Light or reduced-fat mayonnaise and salad dressings (reduced sodium). Avocado. Safflower, olive, or canola oils. Natural peanut or almond butter.  Other Unsalted popcorn and pretzels. The items listed above may not be a complete list of recommended foods or beverages. Contact your dietitian for more options.  +++++++++++++++  WHAT FOODS ARE NOT RECOMMENDED? Grains/ White flour or wheat flour White bread. White pasta. White rice. Refined cornbread. Bagels and croissants. Crackers that contain trans fat.  Vegetables  Creamed or fried vegetables. Vegetables in a . Regular canned vegetables. Regular canned tomato sauce and paste. Regular tomato and vegetable juices.  Fruits Dried fruits. Canned fruit in light or heavy syrup. Fruit juice.  Meat and Other Protein Products Meat in general - RED meat & White meat.  Fatty cuts of meat. Ribs, chicken wings, all processed meats as bacon, sausage, bologna, salami, fatback, hot dogs, bratwurst and packaged luncheon meats.  Dairy Whole or 2% milk, cream, half-and-half, and cream cheese.   Whole-fat or sweetened yogurt. Full-fat cheeses or blue cheese. Non-dairy creamers and whipped toppings. Processed cheese, cheese spreads, or cheese curds.  Condiments Onion and garlic salt, seasoned salt, table salt, and sea salt. Canned and packaged gravies. Worcestershire sauce. Tartar sauce. Barbecue sauce. Teriyaki sauce. Soy sauce, including reduced sodium. Steak sauce. Fish sauce. Oyster sauce. Cocktail sauce. Horseradish. Ketchup and mustard. Meat flavorings and tenderizers. Bouillon cubes. Hot sauce. Tabasco sauce. Marinades. Taco seasonings. Relishes.  Fats and Oils Butter, stick margarine, lard, shortening and  bacon fat. Coconut, palm kernel, or palm oils. Regular salad dressings.  Pickles and olives. Salted popcorn and pretzels.  The items listed above may not be a complete list of foods and beverages to avoid.   

## 2022-01-30 NOTE — Progress Notes (Unsigned)
Annual Screening/Preventative Visit & Comprehensive Evaluation &  Examination   Future Appointments  Date Time Provider Department  01/31/2022                      cpe 11:00 AM Unk Pinto, MD [facility]  05/04/2022                    wellness 11:00 AM Darrol Jump, NP [facility]  02/03/2023                      cpe 11:00 AM Unk Pinto, MD [facility]        This very nice 81 y.o. WWF presents for a Screening /Preventative Visit & comprehensive evaluation and management of multiple medical co-morbidities.  Patient has been followed for HTN, HLD, Prediabetes  and Vitamin D Deficiency. Patient's GERD is stable /controlled on her meds.         HTN predates circa  1999.  Patient had a  Negative Cardiolite in 2011. Patient's BP has been controlled at home and patient denies any cardiac symptoms as chest pain, palpitations, shortness of breath, dizziness or ankle swelling. Today's BP is                                   .        Patient's hyperlipidemia is controlled with diet and Atorvastatin. Patient denies myalgias or other medication SE's. Last lipids were at goal :  Lab Results  Component Value Date   CHOL 145 12/23/2021   HDL 44 (L) 12/23/2021   LDLCALC 79 12/23/2021   TRIG 127 12/23/2021   CHOLHDL 3.3 12/23/2021         Patient has hx/o prediabetes (A1c 5.9% /Insulin 90 /2011) and patient denies reactive hypoglycemic symptoms, visual blurring, diabetic polys or paresthesias. Last A1c was  at goal :  Lab Results  Component Value Date   HGBA1C 5.6 12/23/2021         Finally, patient has history of Vitamin D Deficiency ("31" /2008) and last Vitamin D was  near goal  ( 70-100):  Lab Results  Component Value Date   VD25OH 56 12/23/2021      Current Outpatient Medications:    amLODipine (NORVASC) 2.5 MG tablet, Take  1 tablet  Daily  for BP / Patient knows to take by mouth, Disp: 90 tablet, Rfl: 3   atorvastatin (LIPITOR) 40 MG tablet, Take  1 tablet  Daily   for Cholesterol, Disp: 90 tablet, Rfl: 3   bisoprolol-hydrochlorothiazide (ZIAC) 2.5-6.25 MG tablet, Take  1 tablet  Daily  for BP, Disp: 90 tablet, Rfl: 3   CHOLECALCIFEROL PO, Take 5,000 Units by mouth in the morning, at noon, and at bedtime. , Disp: , Rfl:    Homeopathic Products (GELSEMIUM HOMACCORD PO), Take by mouth. Takes two pellets a day, Disp: , Rfl:    meclizine (ANTIVERT) 25 MG tablet, Take 1 tablet 2 to 3 x /day as needed for Vertigo                                          /                        TAKE  BY                         MOUTH, Disp: 270 tablet, Rfl: 3   olmesartan (BENICAR) 40 MG tablet, Take  1 tablet  Every Night for BP / Patient knows to take by mouth, Disp: 90 tablet, Rfl: 3   PARoxetine (PAXIL) 30 MG tablet, Take  1 tablet  Daily  for Mood  /   Patient knows to take by mouth, Disp: 90 tablet, Rfl: 3    Allergies  Allergen Reactions   Aspirin Other (See Comments)    High Doses stomach trouble   Codeine Nausea And Vomiting   Fenofibrate Cough   Hydrochlorothiazide Other (See Comments)    Fatigue   Metoclopramide Other (See Comments)    Sedation     Past Medical History:  Diagnosis Date   Arthritis    Colon cancer ([place])  2014   Colon polyps    Depression    Dysrhythmia 11/2013on 11/29 w/ Tonya     SVT - RATE 145 POST OP CRANIOTOMY SURGERY --TX'D AND RESOLVED   GERD (gastroesophageal reflux disease)    Headache(784.0)    hx migraines   Hypertension       Pituitary mass ([place]) NOV 2013   MASS FOUND AFTER PT EXPERIENCED BILATERAL BLURRED / HAZY VISION -ESP LEFT EYE.  S/P CRANIOTOMY AND EYESIGHT RETURNED TO NORMAL--PT STILL HAS A LOT OF TENDERNESS RIGHT SIDE OF NOSE --THE CRANIOTOMY WAS DONE BY TRANSNASAL APPROACH   Pneumonia yrs ago   hx of pneumonia   PONV (postoperative nausea and vomiting)    PT STATES PLEASE NOTE SHE HAS TENDERNESS RIGHT SIDE OF NOSE - SINCE TRANSNASAL CRANIOTOMY 04/2012   Prediabetes    Vitamin D deficiency       Health Maintenance  Topic Date Due   COVID-19 Vaccine (1) Never done   Zoster Vaccines- Shingrix (1 of 2) Never done   INFLUENZA VACCINE  01/04/2021   TETANUS/TDAP  11/30/2025   DEXA SCAN  Completed   PNA vac Low Risk Adult  Completed   HPV VACCINES  Aged Out     Immunization History  Administered Date(s) Administered   Influenza Split 04/19/2012   Influenza, High Dose Seasonal PF 04/17/2013, 03/12/2014, 03/03/2017, 04/12/2018, 04/17/2019, 04/20/2020   Pneumococcal Conjugate-13 09/30/2014   Pneumococcal Polysaccharide-23 04/19/2012   Td 07/20/2005, 12/01/2015     Last Colon - 07/03/2018 - Dr Paulita Fujita - recc no F/U due to age     Last MGM - 10/2013 - patient was encouraged to schedule MGM  for the  last 3 years   Past Surgical History:  Procedure Laterality Date   BACK SURGERY     lower   CATARACT EXTRACTION  June/july 2013   bilateral eyes   COLON RESECTION  1988   COLON SURGERY  1990's   COLONOSCOPY  09/20/2011   Procedure: COLONOSCOPY;  Surgeon: Missy Sabins, MD;  Location: [facility];  Service: Endoscopy;  Laterality: N/A;   COLONOSCOPY  12/15/2011   Procedure: COLONOSCOPY;  Surgeon: Missy Sabins, MD;  Location: [facility];  Service: Endoscopy;  Laterality: N/A;   COLONOSCOPY  01/26/2012   Procedure: COLONOSCOPY;  Surgeon: Cleotis Nipper, MD;  Location: [facility];  Service: Endoscopy;  Laterality: N/A;   CRANIOTOMY  04/18/2012   Procedure: CRANIOTOMY HYPOPHYSECTOMY TRANSNASAL APPROACH;  Surgeon: Ophelia Charter, MD;  Location: [facility];  Service: Neurosurgery;  Laterality: Bilateral;  Transphenoidal resection of  Pituitary tumor.    CYSTOSCOPY WITH URETEROSCOPY AND STENT PLACEMENT Bilateral 10/01/2015   Procedure: CYSTOSCOPY, retrograde WITH URETEROSCOPY AND STENT PLACEMENT, ;  Surgeon: Raynelle Bring, MD;  Location: [facility];  Service: Urology;  Laterality: Bilateral;   CYSTOSCOPY WITH URETEROSCOPY AND STENT PLACEMENT Right 10/19/2015   Procedure:  CYSTOSCOPY,  RIGHT URETEROSCOPY, HOLMIUM LASER RIGHT URETERAL STONE, RIGHT URETERAL STENT PLACEMENT, LEFT URETEROSCOPY, INSERTION LEFT URETERAL STENT;  Surgeon: Raynelle Bring, MD;  Location: [facility];  Service: Urology;  Laterality: Right;   EYE SURGERY     cataract bil   HOLMIUM LASER APPLICATION Right 11/25/2977   Procedure: HOLMIUM LASER  ;  Surgeon: Raynelle Bring, MD;  Location: [facility];  Service: Urology;  Laterality: Right;   HOT HEMOSTASIS  09/20/2011   Procedure: HOT HEMOSTASIS (ARGON PLASMA COAGULATION/BICAP);  Surgeon: Missy Sabins, MD;  Location: [facility];  Service: Endoscopy;  Laterality: N/A;   LAPAROSCOPIC RIGHT HEMI COLECTOMY  06/27/2012   Procedure: LAPAROSCOPIC RIGHT HEMI COLECTOMY;  Surgeon: Pedro Earls, MD;  Location: [facility];  Service: General;  Laterality: N/A;  Laparoscopic Assited Right Hemicolectomy   TRANSNASAL APPROACH  04/18/2012   Procedure: TRANSNASAL APPROACH;  Surgeon: Rozetta Nunnery, MD;  Location: [facility];  Service: ENT;  Laterality: Bilateral;  Transnasal approach   TUMOR REMOVAL     Left leg     Family History  Problem Relation Age of Onset   Colon cancer Sister 31   Heart disease Mother    Heart disease Father    Cancer Maternal Aunt        unknown form of cancer dx in her 14s-50s   Liver cancer Maternal Uncle        heavy ETOH user   Breast cancer Sister 107   Dementia Sister    Colon cancer Cousin    Colon polyps Son    Colon polyps Son    Colon cancer Other 39   Colon polyps Other    Brain cancer Cousin        dx in his 52s; paternal cousin     Social History   Tobacco Use   Smoking status: Never   Smokeless tobacco: Never  Vaping Use   Vaping Use: Never used  Substance Use Topics   Alcohol use: No    Comment: a couple times a year, one drink at a time   Drug use: No      ROS Constitutional: Denies fever, chills, weight loss/gain, headaches, insomnia,  night sweats, and change in appetite. Does c/o fatigue. Eyes:  Denies redness, blurred vision, diplopia, discharge, itchy, watery eyes.  ENT: Denies discharge, congestion, post nasal drip, epistaxis, sore throat, earache, hearing loss, dental pain, Tinnitus, Vertigo, Sinus pain, snoring.  Cardio: Denies chest pain, palpitations, irregular heartbeat, syncope, dyspnea, diaphoresis, orthopnea, PND, claudication, edema Respiratory: denies cough, dyspnea, DOE, pleurisy, hoarseness, laryngitis, wheezing.  Gastrointestinal: Denies dysphagia, heartburn, reflux, water brash, pain, cramps, nausea, vomiting, bloating, diarrhea, constipation, hematemesis, melena, hematochezia, jaundice, hemorrhoids Genitourinary: Denies dysuria, frequency, urgency, nocturia, hesitancy, discharge, hematuria, flank pain Breast: Breast lumps, nipple discharge, bleeding.  Musculoskeletal: Denies arthralgia, myalgia, stiffness, Jt. Swelling, pain, limp, and strain/sprain. Denies falls. Skin: Denies puritis, rash, hives, warts, acne, eczema, changing in skin lesion Neuro: No weakness, tremor, incoordination, spasms, paresthesia, pain Psychiatric: Denies confusion, memory loss, sensory loss. Denies Depression. Endocrine: Denies change in weight, skin, hair change, nocturia, and paresthesia, diabetic polys, visual blurring, hyper / hypo glycemic episodes.  Heme/Lymph: No excessive bleeding,  bruising, enlarged lymph nodes.  Physical Exam  There were no vitals taken for this visit.  General Appearance: Well nourished, well groomed and in no apparent distress.  Eyes: PERRLA, EOMs, conjunctiva no swelling or erythema, normal fundi and vessels. Sinuses: No frontal/maxillary tenderness ENT/Mouth: EACs patent / TMs  nl. Nares clear without erythema, swelling, mucoid exudates. Oral hygiene is good. No erythema, swelling, or exudate. Tongue normal, non-obstructing. Tonsils not swollen or erythematous. Hearing normal.  Neck: Supple, thyroid not palpable. No bruits, nodes or JVD. Respiratory:  Respiratory effort normal.  BS equal and clear bilateral without rales, rhonci, wheezing or stridor. Cardio: Heart sounds are normal with regular rate and rhythm and no murmurs, rubs or gallops. Peripheral pulses are normal and equal bilaterally without edema. No aortic or femoral bruits. Chest: symmetric with normal excursions and percussion. Breasts: Symmetric, without lumps, nipple discharge, retractions, or fibrocystic changes.  Abdomen: Flat, soft with bowel sounds active. Nontender, no guarding, rebound, hernias, masses, or organomegaly.  Lymphatics: Non tender without lymphadenopathy.  Genitourinary:  Musculoskeletal: Full ROM all peripheral extremities, joint stability, 5/5 strength, and normal gait. Skin: Warm and dry without rashes, lesions, cyanosis, clubbing or  ecchymosis.  Neuro: Cranial nerves intact, reflexes equal bilaterally. Normal muscle tone, no cerebellar symptoms. Sensation intact.  Pysch: Alert and oriented X 3, normal affect, Insight and Judgment appropriate.    Assessment and Plan  1. Annual Preventative Screening Examination   2. Essential hypertension  - EKG 12-Lead - Urinalysis, Routine w reflex microscopic - Microalbumin / creatinine urine ratio - CBC with Differential/Platelet - COMPLETE METABOLIC PANEL WITH GFR - Magnesium  3. Hyperlipidemia, mixed  - EKG 12-Lead - Lipid panel - TSH  4. Abnormal glucose  - EKG 12-Lead - Hemoglobin A1c - Insulin, random  5. Vitamin D deficiency  - VITAMIN D 25 Hydroxy  6. Gastroesophageal reflux disease  - CBC with Differential/Platelet  7. Screening for colorectal cancer  - POC Hemoccult Bld/Stl  8. History of colon cancer, stage II  - POC Hemoccult Bld/Stl   9. Screening for heart disease  - EKG 12-Lead  10. FHx: heart disease  - EKG 12-Lead  11. Medication management  - Urinalysis, Routine w reflex microscopic - Microalbumin / creatinine urine ratio - CBC with  Differential/Platelet - COMPLETE METABOLIC PANEL WITH GFR - Magnesium - Lipid panel - TSH - Hemoglobin A1c - Insulin, random - VITAMIN D 25 Hydroxy           Patient was counseled in prudent diet to achieve/maintain BMI less than 25 for weight control, BP monitoring, regular exercise and medications. Discussed med's effects and SE's. Screening labs and tests as requested with regular follow-up as recommended. Over 40 minutes of exam, counseling, chart review and high complex critical decision making was performed.   Kirtland Bouchard, MD

## 2022-01-31 ENCOUNTER — Ambulatory Visit (INDEPENDENT_AMBULATORY_CARE_PROVIDER_SITE_OTHER): Payer: PPO | Admitting: Internal Medicine

## 2022-01-31 ENCOUNTER — Other Ambulatory Visit: Payer: Self-pay | Admitting: Internal Medicine

## 2022-01-31 ENCOUNTER — Encounter: Payer: Self-pay | Admitting: Internal Medicine

## 2022-01-31 VITALS — BP 120/80 | HR 55 | Temp 97.9°F | Resp 17 | Ht 65.0 in | Wt 151.6 lb

## 2022-01-31 DIAGNOSIS — R7303 Prediabetes: Secondary | ICD-10-CM

## 2022-01-31 DIAGNOSIS — Z136 Encounter for screening for cardiovascular disorders: Secondary | ICD-10-CM

## 2022-01-31 DIAGNOSIS — M25562 Pain in left knee: Secondary | ICD-10-CM

## 2022-01-31 DIAGNOSIS — E782 Mixed hyperlipidemia: Secondary | ICD-10-CM | POA: Diagnosis not present

## 2022-01-31 DIAGNOSIS — Z8249 Family history of ischemic heart disease and other diseases of the circulatory system: Secondary | ICD-10-CM | POA: Diagnosis not present

## 2022-01-31 DIAGNOSIS — E559 Vitamin D deficiency, unspecified: Secondary | ICD-10-CM

## 2022-01-31 DIAGNOSIS — R7309 Other abnormal glucose: Secondary | ICD-10-CM

## 2022-01-31 DIAGNOSIS — K219 Gastro-esophageal reflux disease without esophagitis: Secondary | ICD-10-CM | POA: Diagnosis not present

## 2022-01-31 DIAGNOSIS — N6312 Unspecified lump in the right breast, upper inner quadrant: Secondary | ICD-10-CM

## 2022-01-31 DIAGNOSIS — Z1211 Encounter for screening for malignant neoplasm of colon: Secondary | ICD-10-CM

## 2022-01-31 DIAGNOSIS — Z79899 Other long term (current) drug therapy: Secondary | ICD-10-CM

## 2022-01-31 DIAGNOSIS — I10 Essential (primary) hypertension: Secondary | ICD-10-CM

## 2022-01-31 DIAGNOSIS — Z0001 Encounter for general adult medical examination with abnormal findings: Secondary | ICD-10-CM

## 2022-01-31 DIAGNOSIS — Z0000 Encounter for general adult medical examination without abnormal findings: Secondary | ICD-10-CM | POA: Diagnosis not present

## 2022-01-31 DIAGNOSIS — Z85038 Personal history of other malignant neoplasm of large intestine: Secondary | ICD-10-CM

## 2022-01-31 MED ORDER — MELOXICAM 15 MG PO TABS: ORAL_TABLET | ORAL | 3 refills | Status: DC

## 2022-02-01 LAB — COMPLETE METABOLIC PANEL WITH GFR
AG Ratio: 1.9 (calc) (ref 1.0–2.5)
ALT: 14 U/L (ref 6–29)
AST: 16 U/L (ref 10–35)
Albumin: 4.3 g/dL (ref 3.6–5.1)
Alkaline phosphatase (APISO): 64 U/L (ref 37–153)
BUN: 9 mg/dL (ref 7–25)
CO2: 26 mmol/L (ref 20–32)
Calcium: 9.9 mg/dL (ref 8.6–10.4)
Chloride: 107 mmol/L (ref 98–110)
Creat: 0.8 mg/dL (ref 0.60–0.95)
Globulin: 2.3 g/dL (calc) (ref 1.9–3.7)
Glucose, Bld: 97 mg/dL (ref 65–99)
Potassium: 4.1 mmol/L (ref 3.5–5.3)
Sodium: 143 mmol/L (ref 135–146)
Total Bilirubin: 0.7 mg/dL (ref 0.2–1.2)
Total Protein: 6.6 g/dL (ref 6.1–8.1)
eGFR: 74 mL/min/{1.73_m2} (ref >=60)

## 2022-02-01 LAB — URINALYSIS, ROUTINE W REFLEX MICROSCOPIC
Bacteria, UA: NONE SEEN /HPF
Bilirubin Urine: NEGATIVE
Glucose, UA: NEGATIVE
Hgb urine dipstick: NEGATIVE
Ketones, ur: NEGATIVE
Nitrite: NEGATIVE
RBC / HPF: NONE SEEN /HPF (ref 0–2)
Specific Gravity, Urine: 1.017 (ref 1.001–1.035)
pH: 6 (ref 5.0–8.0)

## 2022-02-01 LAB — CBC WITH DIFFERENTIAL/PLATELET
Absolute Monocytes: 390 cells/uL (ref 200–950)
Basophils Absolute: 49 cells/uL (ref 0–200)
Basophils Relative: 0.8 %
Eosinophils Absolute: 238 cells/uL (ref 15–500)
Eosinophils Relative: 3.9 %
HCT: 36 % (ref 35.0–45.0)
Hemoglobin: 11.9 g/dL (ref 11.7–15.5)
Lymphs Abs: 1348 cells/uL (ref 850–3900)
MCH: 29.7 pg (ref 27.0–33.0)
MCHC: 33.1 g/dL (ref 32.0–36.0)
MCV: 89.8 fL (ref 80.0–100.0)
MPV: 10.9 fL (ref 7.5–12.5)
Monocytes Relative: 6.4 %
Neutro Abs: 4075 cells/uL (ref 1500–7800)
Neutrophils Relative %: 66.8 %
Platelets: 229 10*3/uL (ref 140–400)
RBC: 4.01 10*6/uL (ref 3.80–5.10)
RDW: 13.5 % (ref 11.0–15.0)
Total Lymphocyte: 22.1 %
WBC: 6.1 10*3/uL (ref 3.8–10.8)

## 2022-02-01 LAB — MICROSCOPIC MESSAGE

## 2022-02-01 LAB — TSH: TSH: 2.17 mIU/L (ref 0.40–4.50)

## 2022-02-01 LAB — LIPID PANEL
Cholesterol: 139 mg/dL (ref <200)
HDL: 46 mg/dL — ABNORMAL LOW (ref >=50)
LDL Cholesterol (Calc): 73 mg/dL (calc)
Non-HDL Cholesterol (Calc): 93 mg/dL (calc) (ref <130)
Total CHOL/HDL Ratio: 3 (calc) (ref <5.0)
Triglycerides: 112 mg/dL (ref <150)

## 2022-02-01 LAB — HEMOGLOBIN A1C
Hgb A1c MFr Bld: 5.7 % of total Hgb — ABNORMAL HIGH (ref <5.7)
Mean Plasma Glucose: 117 mg/dL
eAG (mmol/L): 6.5 mmol/L

## 2022-02-01 LAB — INSULIN, RANDOM: Insulin: 6.6 u[IU]/mL

## 2022-02-01 LAB — MICROALBUMIN / CREATININE URINE RATIO
Creatinine, Urine: 251 mg/dL (ref 20–275)
Microalb Creat Ratio: 20 mcg/mg creat (ref <30)
Microalb, Ur: 5.1 mg/dL

## 2022-02-01 LAB — VITAMIN D 25 HYDROXY (VIT D DEFICIENCY, FRACTURES): Vit D, 25-Hydroxy: 56 ng/mL (ref 30–100)

## 2022-02-01 LAB — MAGNESIUM: Magnesium: 2 mg/dL (ref 1.5–2.5)

## 2022-02-01 NOTE — Progress Notes (Signed)
<><><><><><><><><><><><><><><><><><><><><><><><><><><><><><><><><> <><><><><><><><><><><><><><><><><><><><><><><><><><><><><><><><><>  -   Total Chol = 139   &  LDL Chol = 73   - Both  Excellent   - Very low risk for Heart Attack  / Stroke <><><><><><><><><><><><><><><><><><><><><><><><><><><><><><><><><>  - A1c = 5.7% - Borderline elevated Blood sugar  - Recommend   - Avoid Sweets, Candy & White Stuff   - White Rice, White [place], White Flour  - Breads &  Pasta <><><><><><><><><><><><><><><><><><><><><><><><><><><><><><><><><>  - Vitamin D = 56 - OK  - Please keep dose same  <><><><><><><><><><><><><><><><><><><><><><><><><><><><><><><><><>  - All Else - CBC - Kidneys - Electrolytes - Liver - Magnesium & Thyroid    - all  Normal / OK <><><><><><><><><><><><><><><><><><><><><><><><><><><><><><><><><>                      <><><><><><><><><><><><><><><><><><><><><><><><><><><><><><><><><> <><><><><><><><><><><><><><><><><><><><><><><><><><><><><><><><><>  -

## 2022-02-02 NOTE — Progress Notes (Signed)
Patient's phone has calling restrictions and I was unable to get through. Copy of lab results are being mailed to her. -e welch

## 2022-02-11 ENCOUNTER — Other Ambulatory Visit: Payer: PPO

## 2022-02-14 ENCOUNTER — Emergency Department (HOSPITAL_COMMUNITY): Payer: PPO

## 2022-02-14 ENCOUNTER — Emergency Department (HOSPITAL_COMMUNITY)
Admission: EM | Admit: 2022-02-14 | Discharge: 2022-02-14 | Disposition: A | Discharge status: Home / Self Care | Payer: PPO | Attending: Emergency Medicine | Admitting: Emergency Medicine

## 2022-02-14 ENCOUNTER — Other Ambulatory Visit: Payer: Self-pay

## 2022-02-14 ENCOUNTER — Encounter (HOSPITAL_COMMUNITY): Payer: Self-pay

## 2022-02-14 DIAGNOSIS — M25562 Pain in left knee: Secondary | ICD-10-CM | POA: Diagnosis not present

## 2022-02-14 DIAGNOSIS — M1712 Unilateral primary osteoarthritis, left knee: Secondary | ICD-10-CM | POA: Diagnosis not present

## 2022-02-14 DIAGNOSIS — S73102A Unspecified sprain of left hip, initial encounter: Secondary | ICD-10-CM | POA: Diagnosis not present

## 2022-02-14 DIAGNOSIS — M47814 Spondylosis without myelopathy or radiculopathy, thoracic region: Secondary | ICD-10-CM | POA: Diagnosis not present

## 2022-02-14 DIAGNOSIS — W010XXA Fall on same level from slipping, tripping and stumbling without subsequent striking against object, initial encounter: Secondary | ICD-10-CM | POA: Diagnosis not present

## 2022-02-14 DIAGNOSIS — M25572 Pain in left ankle and joints of left foot: Secondary | ICD-10-CM | POA: Diagnosis not present

## 2022-02-14 DIAGNOSIS — M19012 Primary osteoarthritis, left shoulder: Secondary | ICD-10-CM | POA: Diagnosis not present

## 2022-02-14 DIAGNOSIS — I10 Essential (primary) hypertension: Secondary | ICD-10-CM | POA: Diagnosis not present

## 2022-02-14 DIAGNOSIS — M25552 Pain in left hip: Secondary | ICD-10-CM | POA: Diagnosis not present

## 2022-02-14 DIAGNOSIS — M7732 Calcaneal spur, left foot: Secondary | ICD-10-CM | POA: Diagnosis not present

## 2022-02-14 DIAGNOSIS — S8392XA Sprain of unspecified site of left knee, initial encounter: Secondary | ICD-10-CM | POA: Diagnosis not present

## 2022-02-14 DIAGNOSIS — M25512 Pain in left shoulder: Secondary | ICD-10-CM | POA: Insufficient documentation

## 2022-02-14 DIAGNOSIS — Z79899 Other long term (current) drug therapy: Secondary | ICD-10-CM | POA: Insufficient documentation

## 2022-02-14 DIAGNOSIS — W19XXXA Unspecified fall, initial encounter: Secondary | ICD-10-CM

## 2022-02-14 DIAGNOSIS — I700 Atherosclerosis of aorta: Secondary | ICD-10-CM | POA: Diagnosis not present

## 2022-02-14 DIAGNOSIS — S79912A Unspecified injury of left hip, initial encounter: Secondary | ICD-10-CM | POA: Diagnosis present

## 2022-02-14 DIAGNOSIS — M19072 Primary osteoarthritis, left ankle and foot: Secondary | ICD-10-CM | POA: Diagnosis not present

## 2022-02-14 MED ORDER — NAPROXEN 375 MG PO TABS: 375.0000 mg | ORAL_TABLET | Freq: Two times a day (BID) | ORAL | 0 refills | Status: DC

## 2022-02-14 MED ORDER — TRAMADOL HCL 50 MG PO TABS: 50.0000 mg | ORAL_TABLET | Freq: Four times a day (QID) | ORAL | 0 refills | Status: DC | PRN

## 2022-02-14 NOTE — ED Triage Notes (Signed)
Patient states she tripped over one of her cats about 3 weeks ago, landing on concrete. Patient complains of left shoulder, left hip, left knee and left ankle pain.

## 2022-02-14 NOTE — Progress Notes (Unsigned)
Hospital follow up  Assessment and Plan: Hospital visit follow up for:      All medications were reviewed with patient and family and fully reconciled. All questions answered fully, and patient and family members were encouraged to call the office with any further questions or concerns. Discussed goal to avoid readmission related to this diagnosis.  There are no discontinued medications.  CAN NOT DO FOR BCBS REGULAR OR MEDICARE***  Over 40 minutes of exam, counseling, chart review, and complex, high/moderate level critical decision making was performed this visit.   Future Appointments  Date Time Provider [place]  02/15/2022 10:00 AM Alycia Rossetti, NP [facility] None  05/04/2022 11:00 AM Darrol Jump, NP [facility] None  08/04/2022  2:30 PM Kristen Pinto, MD [facility] None  02/03/2023 11:00 AM Kristen Pinto, MD [facility] None     HPI 81 y.o.female presents for follow up from ER visit to the hospital on 02/14/22, patient was discharged from the hospital on 02/14/22 and our clinical staff contacted the office the day after discharge to set up a follow up appointment. The discharge summary, medications, and diagnostic test results were reviewed before meeting with the patient. The patient was admitted for:       Home health {ACTION; IS/IS [id number]} involved.   Images while in the hospital: DG Knee Complete 4 Views Left  Result Date: 02/14/2022 CLINICAL DATA:  Fall 3 weeks ago, pain EXAM: LEFT KNEE - COMPLETE 4+ VIEW COMPARISON:  None Available. FINDINGS: Left knee moderately severe tricompartmental osteoarthritis with joint space loss, sclerosis and bony spurring. Bones are osteopenic. No acute osseous finding, fracture, malalignment or large effusion. No focal soft tissue abnormality. IMPRESSION: Tricompartmental osteoarthritis most pronounced medially. Osteopenia. No acute abnormality by plain radiography Electronically Signed   By: [name].  Shick M.D.   On:  02/14/2022 12:06   DG Ankle Complete Left  Result Date: 02/14/2022 CLINICAL DATA:  Fall 3 weeks ago, ankle pain EXAM: LEFT ANKLE COMPLETE - 3+ VIEW COMPARISON:  None Available. FINDINGS: Normal alignment. No acute osseous finding, or fracture. Malleoli, talus and calcaneus appear intact. Minor scattered degenerative changes with bony spurring. Small plantar calcaneal spur. Bones are osteopenic. IMPRESSION: No acute finding. Electronically Signed   By: [name].  Shick M.D.   On: 02/14/2022 12:04   DG Shoulder Left  Result Date: 02/14/2022 CLINICAL DATA:  Fall 3 weeks ago, left shoulder pain EXAM: LEFT SHOULDER - 2+ VIEW COMPARISON:  None Available. FINDINGS: No acute osseous finding, malalignment or displaced fracture. AC joint degenerative change noted without separation. Included upper chest unremarkable. Aorta atherosclerotic. Degenerative changes of the thoracic spine. IMPRESSION: No acute abnormality.  Osteopenia and degenerative changes. Electronically Signed   By: [name].  Shick M.D.   On: 02/14/2022 11:55   CT Hip Left Wo Contrast  Result Date: 02/14/2022 CLINICAL DATA:  Hip trauma. Fracture suspected. Left hip pain. Fell in [place] 4 weeks ago. EXAM: CT OF THE LEFT HIP WITHOUT CONTRAST TECHNIQUE: Multidetector CT imaging of the left hip was performed according to the standard protocol. Multiplanar CT image reconstructions were also generated. RADIATION DOSE REDUCTION: This exam was performed according to the departmental dose-optimization program which includes automated exposure control, adjustment of the mA and/or kV according to patient size and/or use of iterative reconstruction technique. COMPARISON:  CT abdomen and pelvis 09/30/2015 FINDINGS: Bones/Joint/Cartilage There is diffuse decreased bone mineralization. Within this limitation, no acute fracture line is visualized. Moderate to severe pubic symphysis joint space narrowing, subchondral sclerosis/cystic change, and peripheral osteophytosis.  Moderate anterior superior left acetabular joint space narrowing with moderate anterosuperior and posterosuperior acetabular peripheral degenerative osteophytes. Minimal left femoral head-neck junction circumferential degenerative osteophytes. Ligaments Suboptimally assessed by CT. Muscles and Tendons Normal density and size of the regional left hip musculature. No gross tendon tear is seen. Soft tissues No soft tissue mass or fluid collection. IMPRESSION: 1. No acute fracture within the left hip. 2. Moderate to severe pubic symphysis and moderate left femoroacetabular osteoarthritis. Electronically Signed   By: [name] M.D.   On: 02/14/2022 11:46      Current Outpatient Medications (Cardiovascular):    amLODipine (NORVASC) 2.5 MG tablet, Take  1 tablet  Daily  for BP / Patient knows to take by mouth   atorvastatin (LIPITOR) 40 MG tablet, Take  1 tablet  Daily  for Cholesterol   bisoprolol-hydrochlorothiazide (ZIAC) 2.5-6.25 MG tablet, Take  1 tablet  Daily  for BP   olmesartan (BENICAR) 40 MG tablet, Take  1 tablet  Every Night for BP / Patient knows to take by mouth   Current Outpatient Medications (Analgesics):    naproxen (NAPROSYN) 375 MG tablet, Take 1 tablet (375 mg total) by mouth 2 (two) times daily. Take with food   traMADol (ULTRAM) 50 MG tablet, Take 1 tablet (50 mg total) by mouth every 6 (six) hours as needed.   Current Outpatient Medications (Other):    CHOLECALCIFEROL PO, Take 5,000 Units by mouth in the morning, at noon, and at bedtime.    meclizine (ANTIVERT) 25 MG tablet, Take 1 tablet 2 to 3 x /day as needed for Vertigo                                          /                        TAKE                     BY                         MOUTH   PARoxetine (PAXIL) 30 MG tablet, Take  1 tablet  Daily  for Mood  /   Patient knows to take by mouth  Past Medical History:  Diagnosis Date   Arthritis    Colon cancer ([place]) 1990s, 2014   Colon polyps    Depression     Dysrhythmia 04/06/2012   GERD (gastroesophageal reflux disease)    Headache(784.0)    hx migraines   Hypertension 1999   Pituitary mass ([place]) 04/06/2012   MASS FOUND AFTER PT EXPERIENCED BILATERAL BLURRED / HAZY VISION -ESP LEFT EYE.  S/P CRANIOTOMY AND EYESIGHT RETURNED TO NORMAL--PT STILL HAS A LOT OF TENDERNESS RIGHT SIDE OF NOSE --THE CRANIOTOMY WAS DONE BY TRANSNASAL APPROACH   Pneumonia yrs ago   hx of pneumonia   PONV (postoperative nausea and vomiting)    PT STATES PLEASE NOTE SHE HAS TENDERNESS RIGHT SIDE OF NOSE - SINCE TRANSNASAL CRANIOTOMY 04/2012   Prediabetes    Vitamin D deficiency      Allergies  Allergen Reactions   Aspirin Other (See Comments)    High Doses stomach trouble   Codeine Nausea And Vomiting   Fenofibrate Cough   Hydrochlorothiazide Other (See Comments)    Fatigue   Metoclopramide Other (  See Comments)    Sedation    ROS: all negative except above.   Physical Exam: There were no vitals filed for this visit. There were no vitals taken for this visit. General Appearance: Well nourished, in no apparent distress. Eyes: PERRLA, EOMs, conjunctiva no swelling or erythema Sinuses: No Frontal/maxillary tenderness ENT/Mouth: Ext aud canals clear, TMs without erythema, bulging. No erythema, swelling, or exudate on post pharynx.  Tonsils not swollen or erythematous. Hearing normal.  Neck: Supple, thyroid normal.  Respiratory: Respiratory effort normal, BS equal bilaterally without rales, rhonchi, wheezing or stridor.  Cardio: RRR with no MRGs. Brisk peripheral pulses without edema.  Abdomen: Soft, + BS.  Non tender, no guarding, rebound, hernias, masses. Lymphatics: Non tender without lymphadenopathy.  Musculoskeletal: Full ROM, 5/5 strength, normal gait.  Skin: Warm, dry without rashes, lesions, ecchymosis.  Neuro: Cranial nerves intact. Normal muscle tone, no cerebellar symptoms. Sensation intact.  Psych: Awake and oriented X 3, normal affect, Insight and  Judgment appropriate.     Alycia Rossetti, NP 3:23 PM [facility]

## 2022-02-14 NOTE — Discharge Instructions (Signed)
Wear the knee brace as needed for support when standing or walking.  You may remove at bedtime and for bathing.  Use your walker for standing or walking until your hip pain improves.  As discussed, stop the Mobic and take the naproxen as directed and take this medication with food.  Call Dr. Ruthe Mannan office in 1 to 2 weeks to arrange a follow-up appointment if your symptoms are not improving.  Return emergency department for any new or worsening symptoms

## 2022-02-14 NOTE — ED Notes (Signed)
Elastic knee sleeve applied to left knee at this time.

## 2022-02-14 NOTE — ED Provider Notes (Signed)
[facility] EMERGENCY DEPARTMENT Provider Note   CSN: [id number] Arrival date & time: 02/14/22  1007     History  Chief Complaint  Patient presents with   Fall    Jenika ALAZNE QUANT is a 81 y.o. female.   Fall Pertinent negatives include no chest pain, no abdominal pain, no headaches and no shortness of breath.        Adithi ETTAMAE BARKETT is a 81 y.o. female with past medical history of hypertension, arthritis, GERD and SVT who presents to the Emergency Department for evaluation of left shoulder, hip, knee and ankle pain.  She describes a mechanical fall that occurred 3 weeks ago when she tripped and fell over her cat.  She landed on her left side.  Since the fall, she has had gradually worsening pain of her left buttock and hip area.  She is also having some pain of her knee with weightbearing.  Initially had pain and swelling of her ankle, swelling has improved but continues to have pain.  She has been managing her symptoms at home with Tylenol.  She has not seen her primary care provider since the fall.  She denies any head injury, neck pain, LOC, headaches, dizziness, nausea or vomiting.  No visual changes.    Home Medications Prior to Admission medications   Medication Sig Start Date End Date Taking? Authorizing Provider  amLODipine (NORVASC) 2.5 MG tablet Take  1 tablet  Daily  for BP / Patient knows to take by mouth 01/19/21   Unk Pinto, MD  atorvastatin (LIPITOR) 40 MG tablet Take  1 tablet  Daily  for Cholesterol 05/24/21   Unk Pinto, MD  bisoprolol-hydrochlorothiazide [facility]) 2.5-6.25 MG tablet Take  1 tablet  Daily  for BP 01/19/21   Unk Pinto, MD  CHOLECALCIFEROL PO Take 5,000 Units by mouth in the morning, at noon, and at bedtime.     [provider]  meclizine (ANTIVERT) 25 MG tablet Take 1 tablet 2 to 3 x /day as needed for Vertigo                                          /                        TAKE                     BY                          MOUTH 10/04/21   Unk Pinto, MD  meloxicam (MOBIC) 15 MG tablet Take 1/2 to 1 tablet Daily with Food  for Pain & Inflammation 01/31/22   Unk Pinto, MD  olmesartan (BENICAR) 40 MG tablet Take  1 tablet  Every Night for BP / Patient knows to take by mouth 01/19/21   Unk Pinto, MD  PARoxetine (PAXIL) 30 MG tablet Take  1 tablet  Daily  for Mood  /   Patient knows to take by mouth 01/19/21   Unk Pinto, MD      Allergies    Aspirin, Codeine, Fenofibrate, Hydrochlorothiazide, and Metoclopramide    Review of Systems   Review of Systems  Constitutional:  Negative for appetite change, chills and fever.  Eyes:  Negative for visual disturbance.  Respiratory:  Negative for shortness of  breath.   Cardiovascular:  Negative for chest pain.  Gastrointestinal:  Negative for abdominal distention, abdominal pain, nausea and vomiting.  Genitourinary:  Negative for dysuria.  Musculoskeletal:  Positive for arthralgias (Left shoulder, hip, knee and ankle pain).  Skin:  Negative for color change, rash and wound.  Neurological:  Negative for dizziness, syncope, weakness, numbness and headaches.  Psychiatric/Behavioral:  Negative for confusion.     Physical Exam Updated Vital Signs BP (!) 151/74   Pulse (!) 50   Temp 98.2 F (36.8 C) (Oral)   Resp 17   Ht '5\' 5"'$  (1.651 m)   Wt 66.7 kg   SpO2 97%   BMI 24.46 kg/m  Physical Exam Vitals and nursing note reviewed.  Constitutional:      General: She is not in acute distress.    Appearance: Normal appearance. She is not ill-appearing or toxic-appearing.  HENT:     Head: Atraumatic.     Mouth/Throat:     Mouth: Mucous membranes are moist.  Eyes:     Extraocular Movements: Extraocular movements intact.     Conjunctiva/sclera: Conjunctivae normal.     Pupils: Pupils are equal, round, and reactive to light.  Cardiovascular:     Rate and Rhythm: Normal rate and regular rhythm.     Pulses: Normal pulses.  Pulmonary:     Effort:  Pulmonary effort is normal. No respiratory distress.     Breath sounds: Normal breath sounds.  Chest:     Chest wall: No tenderness.  Abdominal:     General: There is no distension.     Palpations: Abdomen is soft.     Tenderness: There is no abdominal tenderness.  Musculoskeletal:        General: Tenderness and signs of injury present.     Left shoulder: Tenderness present. Normal range of motion.     Cervical back: Normal range of motion. No tenderness.     Comments: Tenderness palpation of the anterior left shoulder joint.  No bony deformity or edema.  She has full range of motion with pain on abduction.  Left wrist nontender.  Pain with internal and external rotation of the left hip.  Mild tenderness palpation of the anterior left knee.  No bony deformity or palpable effusion.  Mild tenderness on valgus and varus stress.  Left ankle mild tenderness over the lateral malleolus.  No bony deformity or edema noted.  No ecchymosis.  Skin:    General: Skin is warm.     Capillary Refill: Capillary refill takes less than 2 seconds.     Findings: No bruising or erythema.  Neurological:     General: No focal deficit present.     Mental Status: She is alert.     Sensory: No sensory deficit.     Motor: No weakness.     ED Results / Procedures / Treatments   Labs (all labs ordered are listed, but only abnormal results are displayed) Labs Reviewed - No data to display  EKG None  Radiology DG Knee Complete 4 Views Left  Result Date: 02/14/2022 CLINICAL DATA:  Fall 3 weeks ago, pain EXAM: LEFT KNEE - COMPLETE 4+ VIEW COMPARISON:  None Available. FINDINGS: Left knee moderately severe tricompartmental osteoarthritis with joint space loss, sclerosis and bony spurring. Bones are osteopenic. No acute osseous finding, fracture, malalignment or large effusion. No focal soft tissue abnormality. IMPRESSION: Tricompartmental osteoarthritis most pronounced medially. Osteopenia. No acute abnormality by  plain radiography Electronically Signed   By: [name].  Shick M.D.   On: 02/14/2022 12:06   DG Ankle Complete Left  Result Date: 02/14/2022 CLINICAL DATA:  Fall 3 weeks ago, ankle pain EXAM: LEFT ANKLE COMPLETE - 3+ VIEW COMPARISON:  None Available. FINDINGS: Normal alignment. No acute osseous finding, or fracture. Malleoli, talus and calcaneus appear intact. Minor scattered degenerative changes with bony spurring. Small plantar calcaneal spur. Bones are osteopenic. IMPRESSION: No acute finding. Electronically Signed   By: [name].  Shick M.D.   On: 02/14/2022 12:04   DG Shoulder Left  Result Date: 02/14/2022 CLINICAL DATA:  Fall 3 weeks ago, left shoulder pain EXAM: LEFT SHOULDER - 2+ VIEW COMPARISON:  None Available. FINDINGS: No acute osseous finding, malalignment or displaced fracture. AC joint degenerative change noted without separation. Included upper chest unremarkable. Aorta atherosclerotic. Degenerative changes of the thoracic spine. IMPRESSION: No acute abnormality.  Osteopenia and degenerative changes. Electronically Signed   By: [name].  Shick M.D.   On: 02/14/2022 11:55   CT Hip Left Wo Contrast  Result Date: 02/14/2022 CLINICAL DATA:  Hip trauma. Fracture suspected. Left hip pain. Fell in [place] 4 weeks ago. EXAM: CT OF THE LEFT HIP WITHOUT CONTRAST TECHNIQUE: Multidetector CT imaging of the left hip was performed according to the standard protocol. Multiplanar CT image reconstructions were also generated. RADIATION DOSE REDUCTION: This exam was performed according to the departmental dose-optimization program which includes automated exposure control, adjustment of the mA and/or kV according to patient size and/or use of iterative reconstruction technique. COMPARISON:  CT abdomen and pelvis 09/30/2015 FINDINGS: Bones/Joint/Cartilage There is diffuse decreased bone mineralization. Within this limitation, no acute fracture line is visualized. Moderate to severe pubic symphysis joint space narrowing,  subchondral sclerosis/cystic change, and peripheral osteophytosis. Moderate anterior superior left acetabular joint space narrowing with moderate anterosuperior and posterosuperior acetabular peripheral degenerative osteophytes. Minimal left femoral head-neck junction circumferential degenerative osteophytes. Ligaments Suboptimally assessed by CT. Muscles and Tendons Normal density and size of the regional left hip musculature. No gross tendon tear is seen. Soft tissues No soft tissue mass or fluid collection. IMPRESSION: 1. No acute fracture within the left hip. 2. Moderate to severe pubic symphysis and moderate left femoroacetabular osteoarthritis. Electronically Signed   By: [name] M.D.   On: 02/14/2022 11:46     Procedures Procedures    Medications Ordered in ED Medications - No data to display  ED Course/ Medical Decision Making/ A&P Clinical Course as of 02/17/22 1450  Mon Feb 14, 2022  1237 Stable mech fall 3 weeks ago. [CC]    Clinical Course User Index [CC] Tretha Sciara, MD                           Medical Decision Making Patient here for evaluation of her injuries sustained after mechanical fall 3 weeks ago.  Continues to have pain of her left shoulder, hip, knee and ankle denies head injury, neck pain or LOC.  Hip pain worse with attempted ambulation.  On exam, patient does have tenderness to the lateral left hip, pain is reproduced with external rotation.  No bony deformity.  There is some mild tenderness of the ankle and knee as well.  Differential diagnosis would include musculoskeletal injury, fracture, I suspect her injuires are MSK.  Will obtain images  Amount and/or Complexity of Data Reviewed Radiology: ordered.    Details: X-rays of the ankle knee and shoulder all without acute bony findings.  CT left hip without evidence of fracture or  dislocation Discussion of management or test interpretation with external provider(s): Work-up today reassuring.   Patient able to ambulate in the department with walker.  Neurovascularly intact.  Fall was 3 weeks ago.  Has walker at home, agreeable to treatment plan and will follow-up closely with PCP or orthopedics.  Appears appropriate for discharge home.  Return precautions discussed.  Risk Prescription drug management.           Final Clinical Impression(s) / ED Diagnoses Final diagnoses:  Fall, initial encounter  Sprain of left hip, initial encounter  Acute pain of left knee    Rx / DC Orders ED Discharge Orders     None         Kem Parkinson, PA-C 02/17/22 1502    Tretha Sciara, MD 02/21/22 1454

## 2022-02-14 NOTE — ED Notes (Signed)
Pt. Unable to put any weight on her left leg.

## 2022-02-15 ENCOUNTER — Ambulatory Visit (INDEPENDENT_AMBULATORY_CARE_PROVIDER_SITE_OTHER): Payer: PPO | Admitting: Nurse Practitioner

## 2022-02-15 ENCOUNTER — Encounter: Payer: Self-pay | Admitting: Nurse Practitioner

## 2022-02-15 VITALS — BP 146/72 | HR 65 | Temp 97.5°F | Ht 65.0 in | Wt 144.0 lb

## 2022-02-15 DIAGNOSIS — I10 Essential (primary) hypertension: Secondary | ICD-10-CM

## 2022-02-15 DIAGNOSIS — M25562 Pain in left knee: Secondary | ICD-10-CM | POA: Diagnosis not present

## 2022-02-15 NOTE — Patient Instructions (Signed)
Exercises for Chronic Knee Pain Chronic knee pain is pain that lasts longer than 3 months. For most people with chronic knee pain, exercise and weight loss is an important part of treatment. Your health care provider may want you to focus on: Strengthening the muscles that support your knee. This can take pressure off your knee and lessen pain. Preventing knee stiffness. Maintaining or increasing how far you can move your knee. Losing weight (if this applies) to take pressure off your knee, decrease your risk for injury, and make it easier for you to exercise. Your health care provider will help you develop an exercise program that matches your needs and physical abilities. Below are simple, low-impact exercises you can do at home. Ask your health care provider or a physical therapist how often you should do your exercise program and how many times to repeat each exercise. General safety tips Follow these safety tips for exercising with chronic knee pain: Get your health care provider's approval before doing any exercises. Start slowly and stop any time an exercise causes pain. Do not exercise if your knee pain is flaring up. Warm up first. Stretching a cold muscle can cause an injury. Do 5-10 minutes of easy movement or light stretching before beginning your exercise routine. Do 5-10 minutes of low-impact activity (like walking or cycling) before starting strengthening exercises. Contact your health care provider any time you have pain during or after exercising. Exercise may cause discomfort but should not be painful. It is normal to be a little stiff or sore after exercising.  Stretching and range-of-motion exercises Front thigh stretch  Stand up straight and support your body by holding on to a chair or resting one hand on a wall. With your legs straight and close together, bend one knee to lift your heel up toward your buttocks. Using one hand for support, grab your ankle with your free  hand. Pull your foot up closer toward your buttocks to feel the stretch in front of your thigh. Hold the stretch for 30 seconds. Repeat __________ times. Complete this exercise __________ times a day. Back thigh stretch  Sit on the floor with your back straight and your legs out straight in front of you. Place the palms of your hands on the floor and slide them toward your feet as you bend at the hip. Try to touch your nose to your knees and feel the stretch in the back of your thighs. Hold for 30 seconds. Repeat __________ times. Complete this exercise __________ times a day. Calf stretch  Stand facing a wall. Place the palms of your hands flat against the wall, arms extended, and lean slightly against the wall. Get into a lunge position with one leg bent at the knee and the other leg stretched out straight behind you. Keep both feet facing the wall and increase the bend in your knee while keeping the heel of the other leg flat on the ground. You should feel the stretch in your calf. Hold for 30 seconds. Repeat __________ times. Complete this exercise __________ times a day. Strengthening exercises Straight leg lift Lie on your back with one knee bent and the other leg out straight. Slowly lift the straight leg without bending the knee. Lift until your foot is about 12 inches (30 cm) off the floor. Hold for 3-5 seconds and slowly lower your leg. Repeat __________ times. Complete this exercise __________ times a day. Single leg dip Stand between two chairs and put both hands on the   backs of the chairs for support. Extend one leg out straight with your body weight resting on the heel of the standing leg. Slowly bend your standing knee to dip your body to the level that is comfortable for you. Hold for 3-5 seconds. Repeat __________ times. Complete this exercise __________ times a day. Hamstring curls Stand straight, knees close together, facing the back of a chair. Hold on to the  back of a chair with both hands. Keep one leg straight. Bend the other knee while bringing the heel up toward the buttock until the knee is bent at a 90-degree angle (right angle). Hold for 3-5 seconds. Repeat __________ times. Complete this exercise __________ times a day. Wall squat Stand straight with your back, hips, and head against a wall. Step forward one foot at a time with your back still against the wall. Your feet should be 2 feet (61 cm) from the wall at shoulder width. Keeping your back, hips, and head against the wall, slide down the wall to as close of a sitting position as you can get. Hold for 5-10 seconds, then slowly slide back up. Repeat __________ times. Complete this exercise __________ times a day. Step-ups Step up with one foot onto a sturdy platform or stool that is about 6 inches (15 cm) high. Face sideways with one foot on the platform and one on the ground. Place all your weight on the platform foot and lift your body off the ground until your knee extends. Let your other leg hang free to the side. Hold for 3-5 seconds then slowly lower your weight down to the floor foot. Repeat __________ times. Complete this exercise __________ times a day. Contact a health care provider if: Your exercise causes pain. Your pain is worse after you exercise. Your pain prevents you from doing your exercises. This information is not intended to replace advice given to you by your health care provider. Make sure you discuss any questions you have with your health care provider. Document Revised: 09/26/2019 Document Reviewed: 05/20/2019 Elsevier Patient Education  [place]. Knee Sprain  A knee sprain is a stretch or tear in a knee ligament. Knee ligaments are tissues that connect bones in the knee to each other. What are the causes? This condition often results from: A fall. An injury to the knee. What are the signs or symptoms? Symptoms of this condition  include: Trouble straightening or bending the leg. Swelling in the knee. Bruising around the knee. Tenderness or pain in the knee. Sudden muscle tightening (spasms) around the knee. How is this treated? Treatment for this condition may involve: Keeping the knee still (immobilized) with a cast, brace, or splint. Putting ice on the knee. This helps with pain and swelling. Raising (elevating) the knee above the level of your heart when you are resting. This helps with pain and swelling. Taking medicine for pain. Doing exercises to keep your knee from being weak or stiff. Having surgery. This may be needed if the ligament is completely torn. Follow these instructions at home: If you have a splint or brace: Wear it as told by your doctor. Remove it only as told by your doctor. Check the skin around it every day. Tell your doctor about any concerns. Loosen it if your toes: Tingle. Become numb. Turn cold and blue. Keep it clean and dry. If you have a cast: Do not stick anything inside it to scratch your skin. Check the skin around it every day. Tell your doctor  about any concerns. You may put lotion on dry skin around the edges of the cast. Do not put lotion on the skin under the cast. Keep it clean and dry. Bathing If you have a splint, brace, or cast that is not waterproof: Do not let it get wet. Cover it with a watertight covering when you take a bath or a shower. Managing pain, stiffness, and swelling  If told, put ice on the injured area. To do this: If you have a removable splint or brace, remove it as told by your doctor. Put ice in a plastic bag. Place a towel between your skin and the bag or between your cast and the bag. Leave the ice on for 20 minutes, 2-3 times a day. Move your toes often to reduce stiffness and swelling. Raise the injured area above the level of your heart while you are sitting or lying down. General instructions Take over-the-counter and prescription  medicines only as told by your doctor. Do not use any products that contain nicotine or tobacco, such as cigarettes, e-cigarettes, and chewing tobacco. These can delay healing. If you need help quitting, ask your doctor. Do exercises as told by your doctor. Keep all follow-up visits as told by your doctor. This is important. Contact a doctor if: Your pain gets worse. The cast, brace, or splint does not fit right. The cast, brace, or splint gets damaged. Get help right away if: You cannot use your knee to support your body weight (bear weight) for standing or walking. You cannot move the injured area. Your knee buckles or you have pain after you walk only a few steps. You have very bad pain, swelling, or numbness in your leg below the cast, brace, or splint. Your foot or toes are numb, cold, or blue after loosening your splint or brace. Summary A knee sprain is a stretch or tear in a tissue (ligament) that connects your knee bones to each other. You may need to wear a splint, brace, or cast to keep the knee still while it is getting better. Surgery may be needed if the ligament is completely torn. This information is not intended to replace advice given to you by your health care provider. Make sure you discuss any questions you have with your health care provider. Document Revised: 08/30/2021 Document Reviewed: 04/12/2019 Elsevier Patient Education  [place].

## 2022-02-17 NOTE — Progress Notes (Unsigned)
Assessment and Plan:  There are no diagnoses linked to this encounter.    Further disposition pending results of labs. Discussed med's effects and SE's.   Over 30 minutes of exam, counseling, chart review, and critical decision making was performed.   Future Appointments  Date Time Provider [place]  02/21/2022  2:30 PM Alycia Rossetti, NP [facility] None  05/04/2022 11:00 AM Darrol Jump, NP [facility] None  08/04/2022  2:30 PM Unk Pinto, MD [facility] None  02/03/2023 11:00 AM Unk Pinto, MD [facility] None    ------------------------------------------------------------------------------------------------------------------   HPI There were no vitals taken for this visit. 81 y.o.female presents for  Past Medical History:  Diagnosis Date   Arthritis    Colon cancer ([place]) 1990s, 2014   Colon polyps    Depression    Dysrhythmia 04/06/2012   GERD (gastroesophageal reflux disease)    Headache(784.0)    hx migraines   Hypertension 1999   Pituitary mass ([place]) 04/06/2012   MASS FOUND AFTER PT EXPERIENCED BILATERAL BLURRED / HAZY VISION -ESP LEFT EYE.  S/P CRANIOTOMY AND EYESIGHT RETURNED TO NORMAL--PT STILL HAS A LOT OF TENDERNESS RIGHT SIDE OF NOSE --THE CRANIOTOMY WAS DONE BY TRANSNASAL APPROACH   Pneumonia yrs ago   hx of pneumonia   PONV (postoperative nausea and vomiting)    PT STATES PLEASE NOTE SHE HAS TENDERNESS RIGHT SIDE OF NOSE - SINCE TRANSNASAL CRANIOTOMY 04/2012   Prediabetes    Vitamin D deficiency      Allergies  Allergen Reactions   Aspirin Other (See Comments)    High Doses stomach trouble   Codeine Nausea And Vomiting   Fenofibrate Cough   Hydrochlorothiazide Other (See Comments)    Fatigue   Metoclopramide Other (See Comments)    Sedation    Current Outpatient Medications on File Prior to Visit  Medication Sig   amLODipine (NORVASC) 2.5 MG tablet Take  1 tablet  Daily  for BP / Patient knows to take by mouth    atorvastatin (LIPITOR) 40 MG tablet Take  1 tablet  Daily  for Cholesterol   bisoprolol-hydrochlorothiazide (ZIAC) 2.5-6.25 MG tablet Take  1 tablet  Daily  for BP   CHOLECALCIFEROL PO Take 5,000 Units by mouth in the morning, at noon, and at bedtime.    meclizine (ANTIVERT) 25 MG tablet Take 1 tablet 2 to 3 x /day as needed for Vertigo                                          /                        TAKE                     BY                         MOUTH   naproxen (NAPROSYN) 375 MG tablet Take 1 tablet (375 mg total) by mouth 2 (two) times daily. Take with food   olmesartan (BENICAR) 40 MG tablet Take  1 tablet  Every Night for BP / Patient knows to take by mouth   PARoxetine (PAXIL) 30 MG tablet Take  1 tablet  Daily  for Mood  /   Patient knows to take by mouth   traMADol (ULTRAM) 50 MG tablet  Take 1 tablet (50 mg total) by mouth every 6 (six) hours as needed.   No current facility-administered medications on file prior to visit.    ROS: all negative except above.   Physical Exam:  There were no vitals taken for this visit.  General Appearance: Well nourished, in no apparent distress. Eyes: PERRLA, EOMs, conjunctiva no swelling or erythema Sinuses: No Frontal/maxillary tenderness ENT/Mouth: Ext aud canals clear, TMs without erythema, bulging. No erythema, swelling, or exudate on post pharynx.  Tonsils not swollen or erythematous. Hearing normal.  Neck: Supple, thyroid normal.  Respiratory: Respiratory effort normal, BS equal bilaterally without rales, rhonchi, wheezing or stridor.  Cardio: RRR with no MRGs. Brisk peripheral pulses without edema.  Abdomen: Soft, + BS.  Non tender, no guarding, rebound, hernias, masses. Lymphatics: Non tender without lymphadenopathy.  Musculoskeletal: Full ROM, 5/5 strength, normal gait.  Skin: Warm, dry without rashes, lesions, ecchymosis.  Neuro: Cranial nerves intact. Normal muscle tone, no cerebellar symptoms. Sensation intact.  Psych: Awake  and oriented X 3, normal affect, Insight and Judgment appropriate.     Alycia Rossetti, NP 9:25 AM [facility]

## 2022-02-21 ENCOUNTER — Encounter: Payer: Self-pay | Admitting: Nurse Practitioner

## 2022-02-21 ENCOUNTER — Ambulatory Visit (INDEPENDENT_AMBULATORY_CARE_PROVIDER_SITE_OTHER): Payer: PPO | Admitting: Nurse Practitioner

## 2022-02-21 VITALS — BP 138/78 | HR 53 | Temp 97.5°F | Ht 65.0 in | Wt 148.4 lb

## 2022-02-21 DIAGNOSIS — I10 Essential (primary) hypertension: Secondary | ICD-10-CM

## 2022-02-21 DIAGNOSIS — M25562 Pain in left knee: Secondary | ICD-10-CM

## 2022-02-21 DIAGNOSIS — R2689 Other abnormalities of gait and mobility: Secondary | ICD-10-CM | POA: Diagnosis not present

## 2022-02-25 ENCOUNTER — Other Ambulatory Visit: Payer: Self-pay | Admitting: Internal Medicine

## 2022-02-25 DIAGNOSIS — F325 Major depressive disorder, single episode, in full remission: Secondary | ICD-10-CM

## 2022-02-25 DIAGNOSIS — K219 Gastro-esophageal reflux disease without esophagitis: Secondary | ICD-10-CM

## 2022-02-25 DIAGNOSIS — I10 Essential (primary) hypertension: Secondary | ICD-10-CM

## 2022-03-10 ENCOUNTER — Other Ambulatory Visit: Payer: Self-pay | Admitting: Internal Medicine

## 2022-03-10 ENCOUNTER — Ambulatory Visit
Admission: RE | Admit: 2022-03-10 | Discharge: 2022-03-10 | Disposition: A | Discharge status: Home / Self Care | Payer: PPO | Source: Ambulatory Visit | Attending: Internal Medicine | Admitting: Internal Medicine

## 2022-03-10 DIAGNOSIS — N6314 Unspecified lump in the right breast, lower inner quadrant: Secondary | ICD-10-CM | POA: Diagnosis not present

## 2022-03-10 DIAGNOSIS — N6312 Unspecified lump in the right breast, upper inner quadrant: Secondary | ICD-10-CM

## 2022-03-10 DIAGNOSIS — R928 Other abnormal and inconclusive findings on diagnostic imaging of breast: Secondary | ICD-10-CM | POA: Diagnosis not present

## 2022-03-24 ENCOUNTER — Ambulatory Visit
Admission: RE | Admit: 2022-03-24 | Discharge: 2022-03-24 | Disposition: A | Discharge status: Home / Self Care | Payer: PPO | Source: Ambulatory Visit | Attending: Internal Medicine | Admitting: Internal Medicine

## 2022-03-24 DIAGNOSIS — N6312 Unspecified lump in the right breast, upper inner quadrant: Secondary | ICD-10-CM

## 2022-03-24 DIAGNOSIS — C50811 Malignant neoplasm of overlapping sites of right female breast: Secondary | ICD-10-CM | POA: Diagnosis not present

## 2022-03-24 DIAGNOSIS — C50411 Malignant neoplasm of upper-outer quadrant of right female breast: Secondary | ICD-10-CM | POA: Diagnosis not present

## 2022-03-24 DIAGNOSIS — N6311 Unspecified lump in the right breast, upper outer quadrant: Secondary | ICD-10-CM | POA: Diagnosis not present

## 2022-03-24 DIAGNOSIS — Z170 Estrogen receptor positive status [ER+]: Secondary | ICD-10-CM | POA: Diagnosis not present

## 2022-03-28 DIAGNOSIS — I700 Atherosclerosis of aorta: Secondary | ICD-10-CM | POA: Diagnosis not present

## 2022-03-28 DIAGNOSIS — I440 Atrioventricular block, first degree: Secondary | ICD-10-CM | POA: Diagnosis not present

## 2022-03-28 DIAGNOSIS — D352 Benign neoplasm of pituitary gland: Secondary | ICD-10-CM | POA: Diagnosis not present

## 2022-03-28 DIAGNOSIS — M19012 Primary osteoarthritis, left shoulder: Secondary | ICD-10-CM | POA: Diagnosis not present

## 2022-03-28 DIAGNOSIS — Y998 Other external cause status: Secondary | ICD-10-CM | POA: Diagnosis not present

## 2022-03-28 DIAGNOSIS — R4182 Altered mental status, unspecified: Secondary | ICD-10-CM | POA: Diagnosis not present

## 2022-03-28 DIAGNOSIS — M79602 Pain in left arm: Secondary | ICD-10-CM | POA: Diagnosis not present

## 2022-03-28 DIAGNOSIS — S098XXA Other specified injuries of head, initial encounter: Secondary | ICD-10-CM | POA: Diagnosis not present

## 2022-03-28 DIAGNOSIS — R296 Repeated falls: Secondary | ICD-10-CM | POA: Diagnosis not present

## 2022-03-28 DIAGNOSIS — W01198A Fall on same level from slipping, tripping and stumbling with subsequent striking against other object, initial encounter: Secondary | ICD-10-CM | POA: Diagnosis not present

## 2022-03-28 DIAGNOSIS — R001 Bradycardia, unspecified: Secondary | ICD-10-CM | POA: Diagnosis not present

## 2022-03-28 DIAGNOSIS — M25512 Pain in left shoulder: Secondary | ICD-10-CM | POA: Diagnosis not present

## 2022-04-01 ENCOUNTER — Other Ambulatory Visit: Payer: Self-pay | Admitting: General Surgery

## 2022-04-01 DIAGNOSIS — Z170 Estrogen receptor positive status [ER+]: Secondary | ICD-10-CM | POA: Diagnosis not present

## 2022-04-01 DIAGNOSIS — Z9889 Other specified postprocedural states: Secondary | ICD-10-CM | POA: Diagnosis not present

## 2022-04-01 DIAGNOSIS — C50311 Malignant neoplasm of lower-inner quadrant of right female breast: Secondary | ICD-10-CM | POA: Diagnosis not present

## 2022-04-01 DIAGNOSIS — R112 Nausea with vomiting, unspecified: Secondary | ICD-10-CM | POA: Diagnosis not present

## 2022-04-01 DIAGNOSIS — C50411 Malignant neoplasm of upper-outer quadrant of right female breast: Secondary | ICD-10-CM | POA: Diagnosis not present

## 2022-04-01 DIAGNOSIS — C50811 Malignant neoplasm of overlapping sites of right female breast: Secondary | ICD-10-CM

## 2022-04-04 ENCOUNTER — Telehealth: Payer: Self-pay | Admitting: Radiation Oncology

## 2022-04-04 ENCOUNTER — Encounter: Payer: Self-pay | Admitting: *Deleted

## 2022-04-04 ENCOUNTER — Other Ambulatory Visit: Payer: Self-pay | Admitting: General Surgery

## 2022-04-04 DIAGNOSIS — Z170 Estrogen receptor positive status [ER+]: Secondary | ICD-10-CM

## 2022-04-04 NOTE — Progress Notes (Signed)
PATIENT NAVIGATOR PROGRESS NOTE  Name: Kristen [name] Date: 04/04/2022 MRN: [id number]  DOB: Oct 29, 1940   Reason for visit:  Introductory phone call  Comments:  Called pt and discussed referral received from Dr Barry Dienes. Patient states that she is scheduled for surgery this Thursday, November 2. New Patient appt with Dr Benay Spice scheduled for 11/20 at 1:40. Directions to building and parking reviewed as well as one support person allowed in appt. Verbalized understanding    Time spent counseling/coordinating care: 30-45 minutes

## 2022-04-04 NOTE — Telephone Encounter (Signed)
10/30 @ 12:01 pm Left voicemail for patient to call our office to be schedule for consult.

## 2022-04-05 ENCOUNTER — Telehealth: Payer: Self-pay | Admitting: Radiation Oncology

## 2022-04-05 ENCOUNTER — Ambulatory Visit
Admission: RE | Admit: 2022-04-05 | Discharge: 2022-04-05 | Disposition: A | Discharge status: Home / Self Care | Payer: PPO | Source: Ambulatory Visit | Attending: General Surgery | Admitting: General Surgery

## 2022-04-05 DIAGNOSIS — R928 Other abnormal and inconclusive findings on diagnostic imaging of breast: Secondary | ICD-10-CM | POA: Diagnosis not present

## 2022-04-05 DIAGNOSIS — C50811 Malignant neoplasm of overlapping sites of right female breast: Secondary | ICD-10-CM

## 2022-04-05 NOTE — Telephone Encounter (Signed)
10/31 @ 3:41 pm Left voicemail for patient to call our office to be schedule for consult.

## 2022-04-05 NOTE — Telephone Encounter (Signed)
10/31 @ 8:56 am Left voicemail for patient to call our office to be schedule for consult.

## 2022-04-06 ENCOUNTER — Encounter (HOSPITAL_COMMUNITY): Payer: Self-pay | Admitting: General Surgery

## 2022-04-06 ENCOUNTER — Telehealth: Payer: Self-pay | Admitting: Radiation Oncology

## 2022-04-06 NOTE — Progress Notes (Signed)
This Probation officer called patient multiple times with questions and instructions for her surgery scheduled for tomorrow. Also, patient's son was called [phone]) with no result. Left voicemail for patient to be at the hospital [facility]) entrance A tomorrow afternoon at 13:00 o'clock and go to the admitting office; not eat anything after midnight; patient can have only clear liquids until tomorrow at 12:30 o'clock.  Patient was instructed to brush her teeth tonight and tomorrow morning and to take shower tonight and tomorrow morning - nothing to apply on her skin after showers.  Patient was instructed to take Amlodipine and Paxil, and if she will need Meclizine, tomorrow morning with a small sip of water Short-stay phone number [phone]) was left on the voicemail for patient to called if she will have any questions.

## 2022-04-06 NOTE — Telephone Encounter (Signed)
11/1 @ 9:14 am Left voicemail for patient to call our office to be schedule for consult. Fourth attempt to reach out to patient/no response.    Spoke to [employer] (ref coordinator for Dr. Marlowe Aschoff office) so they are aware.  Unable to contact letter sent.

## 2022-04-07 ENCOUNTER — Ambulatory Visit (HOSPITAL_COMMUNITY): Payer: PPO | Admitting: Physician Assistant

## 2022-04-07 ENCOUNTER — Ambulatory Visit (HOSPITAL_COMMUNITY)
Admission: RE | Admit: 2022-04-07 | Discharge: 2022-04-07 | Disposition: A | Discharge status: Home / Self Care | Payer: PPO | Attending: General Surgery | Admitting: General Surgery

## 2022-04-07 ENCOUNTER — Ambulatory Visit (HOSPITAL_BASED_OUTPATIENT_CLINIC_OR_DEPARTMENT_OTHER): Payer: PPO | Admitting: Physician Assistant

## 2022-04-07 ENCOUNTER — Ambulatory Visit
Admission: RE | Admit: 2022-04-07 | Discharge: 2022-04-07 | Disposition: A | Discharge status: Home / Self Care | Payer: PPO | Source: Ambulatory Visit | Attending: General Surgery | Admitting: General Surgery

## 2022-04-07 ENCOUNTER — Encounter (HOSPITAL_COMMUNITY): Payer: Self-pay | Admitting: General Surgery

## 2022-04-07 ENCOUNTER — Other Ambulatory Visit: Payer: Self-pay

## 2022-04-07 ENCOUNTER — Encounter (HOSPITAL_COMMUNITY)
Admission: RE | Disposition: A | Discharge status: Home / Self Care | Payer: Self-pay | Source: Home / Self Care | Attending: General Surgery

## 2022-04-07 DIAGNOSIS — Z170 Estrogen receptor positive status [ER+]: Secondary | ICD-10-CM | POA: Insufficient documentation

## 2022-04-07 DIAGNOSIS — C50811 Malignant neoplasm of overlapping sites of right female breast: Secondary | ICD-10-CM | POA: Diagnosis not present

## 2022-04-07 DIAGNOSIS — C50311 Malignant neoplasm of lower-inner quadrant of right female breast: Secondary | ICD-10-CM | POA: Diagnosis not present

## 2022-04-07 DIAGNOSIS — C50411 Malignant neoplasm of upper-outer quadrant of right female breast: Secondary | ICD-10-CM | POA: Insufficient documentation

## 2022-04-07 DIAGNOSIS — I10 Essential (primary) hypertension: Secondary | ICD-10-CM

## 2022-04-07 DIAGNOSIS — Z800 Family history of malignant neoplasm of digestive organs: Secondary | ICD-10-CM | POA: Insufficient documentation

## 2022-04-07 DIAGNOSIS — R519 Headache, unspecified: Secondary | ICD-10-CM | POA: Diagnosis not present

## 2022-04-07 DIAGNOSIS — C50911 Malignant neoplasm of unspecified site of right female breast: Secondary | ICD-10-CM

## 2022-04-07 DIAGNOSIS — R928 Other abnormal and inconclusive findings on diagnostic imaging of breast: Secondary | ICD-10-CM | POA: Diagnosis not present

## 2022-04-07 DIAGNOSIS — M1990 Unspecified osteoarthritis, unspecified site: Secondary | ICD-10-CM

## 2022-04-07 DIAGNOSIS — C50211 Malignant neoplasm of upper-inner quadrant of right female breast: Secondary | ICD-10-CM | POA: Diagnosis not present

## 2022-04-07 DIAGNOSIS — N641 Fat necrosis of breast: Secondary | ICD-10-CM | POA: Diagnosis not present

## 2022-04-07 HISTORY — DX: Prediabetes: R73.03

## 2022-04-07 HISTORY — PX: BREAST LUMPECTOMY WITH RADIOACTIVE SEED LOCALIZATION: SHX6424

## 2022-04-07 SURGERY — BREAST LUMPECTOMY WITH RADIOACTIVE SEED LOCALIZATION
Anesthesia: General | Site: Breast | Laterality: Right

## 2022-04-07 MED ORDER — OXYCODONE HCL 5 MG PO TABS: 2.5000 mg | ORAL_TABLET | Freq: Four times a day (QID) | ORAL | 0 refills | Status: DC | PRN

## 2022-04-07 MED ORDER — CHLORHEXIDINE GLUCONATE 0.12 % MT SOLN
OROMUCOSAL | Status: AC
  Administered 2022-04-07: 15 mL via OROMUCOSAL
  Filled 2022-04-07: qty 15

## 2022-04-07 MED ORDER — LACTATED RINGERS IV SOLN: INTRAVENOUS | Status: DC

## 2022-04-07 MED ORDER — PHENYLEPHRINE 80 MCG/ML (10ML) SYRINGE FOR IV PUSH (FOR BLOOD PRESSURE SUPPORT)
PREFILLED_SYRINGE | INTRAVENOUS | Status: AC
  Filled 2022-04-07: qty 10

## 2022-04-07 MED ORDER — 0.9 % SODIUM CHLORIDE (POUR BTL) OPTIME
TOPICAL | Status: DC | PRN
  Administered 2022-04-07: 1000 mL

## 2022-04-07 MED ORDER — CHLORHEXIDINE GLUCONATE CLOTH 2 % EX PADS: 6.0000 | MEDICATED_PAD | Freq: Once | CUTANEOUS | Status: DC

## 2022-04-07 MED ORDER — CEFAZOLIN SODIUM-DEXTROSE 2-4 GM/100ML-% IV SOLN
2.0000 g | INTRAVENOUS | Status: AC
  Administered 2022-04-07: 2 g via INTRAVENOUS

## 2022-04-07 MED ORDER — DEXAMETHASONE SODIUM PHOSPHATE 10 MG/ML IJ SOLN
INTRAMUSCULAR | Status: DC | PRN
  Administered 2022-04-07: 10 mg via INTRAVENOUS

## 2022-04-07 MED ORDER — CHLORHEXIDINE GLUCONATE 0.12 % MT SOLN: 15.0000 mL | Freq: Once | OROMUCOSAL | Status: AC

## 2022-04-07 MED ORDER — ACETAMINOPHEN 500 MG PO TABS: 1000.0000 mg | ORAL_TABLET | Freq: Once | ORAL | Status: DC

## 2022-04-07 MED ORDER — CEFAZOLIN SODIUM-DEXTROSE 2-4 GM/100ML-% IV SOLN
INTRAVENOUS | Status: AC
  Filled 2022-04-07: qty 100

## 2022-04-07 MED ORDER — ONDANSETRON HCL 4 MG/2ML IJ SOLN
INTRAMUSCULAR | Status: AC
  Filled 2022-04-07: qty 2

## 2022-04-07 MED ORDER — LIDOCAINE HCL (PF) 1 % IJ SOLN
INTRAMUSCULAR | Status: AC
  Filled 2022-04-07: qty 30

## 2022-04-07 MED ORDER — FENTANYL CITRATE (PF) 250 MCG/5ML IJ SOLN
INTRAMUSCULAR | Status: AC
  Filled 2022-04-07: qty 5

## 2022-04-07 MED ORDER — ORAL CARE MOUTH RINSE: 15.0000 mL | Freq: Once | OROMUCOSAL | Status: AC

## 2022-04-07 MED ORDER — EPHEDRINE SULFATE-NACL 50-0.9 MG/10ML-% IV SOSY
PREFILLED_SYRINGE | INTRAVENOUS | Status: DC | PRN
  Administered 2022-04-07 (×5): 5 mg via INTRAVENOUS

## 2022-04-07 MED ORDER — PROPOFOL 10 MG/ML IV BOLUS
INTRAVENOUS | Status: DC | PRN
  Administered 2022-04-07: 40 mg via INTRAVENOUS
  Administered 2022-04-07: 120 mg via INTRAVENOUS

## 2022-04-07 MED ORDER — FENTANYL CITRATE (PF) 100 MCG/2ML IJ SOLN: 25.0000 ug | INTRAMUSCULAR | Status: DC | PRN

## 2022-04-07 MED ORDER — EPHEDRINE 5 MG/ML INJ
INTRAVENOUS | Status: AC
  Filled 2022-04-07: qty 5

## 2022-04-07 MED ORDER — PROPOFOL 10 MG/ML IV BOLUS
INTRAVENOUS | Status: AC
  Filled 2022-04-07: qty 20

## 2022-04-07 MED ORDER — LIDOCAINE HCL 1 % IJ SOLN
INTRAMUSCULAR | Status: DC | PRN
  Administered 2022-04-07: 60 mL via INTRAMUSCULAR

## 2022-04-07 MED ORDER — BUPIVACAINE-EPINEPHRINE (PF) 0.25% -1:200000 IJ SOLN
INTRAMUSCULAR | Status: AC
  Filled 2022-04-07: qty 30

## 2022-04-07 MED ORDER — FENTANYL CITRATE (PF) 250 MCG/5ML IJ SOLN
INTRAMUSCULAR | Status: DC | PRN
  Administered 2022-04-07: 25 ug via INTRAVENOUS

## 2022-04-07 MED ORDER — ACETAMINOPHEN 500 MG PO TABS
ORAL_TABLET | ORAL | Status: AC
  Administered 2022-04-07: 1000 mg via ORAL
  Filled 2022-04-07: qty 2

## 2022-04-07 MED ORDER — ACETAMINOPHEN 500 MG PO TABS: 1000.0000 mg | ORAL_TABLET | ORAL | Status: AC

## 2022-04-07 MED ORDER — STERILE WATER FOR IRRIGATION IR SOLN
Status: DC | PRN
  Administered 2022-04-07: 1000 mL

## 2022-04-07 MED ORDER — DEXAMETHASONE SODIUM PHOSPHATE 10 MG/ML IJ SOLN
INTRAMUSCULAR | Status: AC
  Filled 2022-04-07: qty 1

## 2022-04-07 MED ORDER — ONDANSETRON HCL 4 MG/2ML IJ SOLN
INTRAMUSCULAR | Status: DC | PRN
  Administered 2022-04-07: 4 mg via INTRAVENOUS

## 2022-04-07 MED ORDER — LIDOCAINE 2% (20 MG/ML) 5 ML SYRINGE
INTRAMUSCULAR | Status: DC | PRN
  Administered 2022-04-07: 80 mg via INTRAVENOUS

## 2022-04-07 MED ORDER — PHENYLEPHRINE 80 MCG/ML (10ML) SYRINGE FOR IV PUSH (FOR BLOOD PRESSURE SUPPORT)
PREFILLED_SYRINGE | INTRAVENOUS | Status: DC | PRN
  Administered 2022-04-07 (×6): 80 ug via INTRAVENOUS

## 2022-04-07 SURGICAL SUPPLY — 40 items
BAG COUNTER SPONGE SURGICOUNT (BAG) ×1 IMPLANT
BINDER BREAST LRG (GAUZE/BANDAGES/DRESSINGS) IMPLANT
BINDER BREAST XLRG (GAUZE/BANDAGES/DRESSINGS) IMPLANT
BLADE SURG 10 STRL SS (BLADE) ×1 IMPLANT
CANISTER SUCT 3000ML PPV (MISCELLANEOUS) IMPLANT
CHLORAPREP W/TINT 26 (MISCELLANEOUS) ×1 IMPLANT
CLIP VESOCCLUDE LG 6/CT (CLIP) ×1 IMPLANT
COVER PROBE W GEL 5X96 (DRAPES) ×1 IMPLANT
COVER SURGICAL LIGHT HANDLE (MISCELLANEOUS) ×1 IMPLANT
DERMABOND ADVANCED .7 DNX12 (GAUZE/BANDAGES/DRESSINGS) ×1 IMPLANT
DEVICE DUBIN SPECIMEN MAMMOGRA (MISCELLANEOUS) ×1 IMPLANT
DRAPE CHEST BREAST 15X10 FENES (DRAPES) ×1 IMPLANT
ELECT COATED BLADE 2.86 ST (ELECTRODE) ×1 IMPLANT
ELECT REM PT RETURN 9FT ADLT (ELECTROSURGICAL) ×1
ELECTRODE REM PT RTRN 9FT ADLT (ELECTROSURGICAL) ×1 IMPLANT
GAUZE PAD ABD 8X10 STRL (GAUZE/BANDAGES/DRESSINGS) ×1 IMPLANT
GAUZE SPONGE 4X4 12PLY STRL (GAUZE/BANDAGES/DRESSINGS) IMPLANT
GAUZE SPONGE 4X4 12PLY STRL LF (GAUZE/BANDAGES/DRESSINGS) ×1 IMPLANT
GLOVE BIO SURGEON STRL SZ 6 (GLOVE) ×1 IMPLANT
GLOVE INDICATOR 6.5 STRL GRN (GLOVE) ×1 IMPLANT
GOWN STRL REUS W/ TWL LRG LVL3 (GOWN DISPOSABLE) ×1 IMPLANT
GOWN STRL REUS W/TWL 2XL LVL3 (GOWN DISPOSABLE) ×1 IMPLANT
GOWN STRL REUS W/TWL LRG LVL3 (GOWN DISPOSABLE) ×1
KIT BASIN OR (CUSTOM PROCEDURE TRAY) ×1 IMPLANT
KIT MARKER MARGIN INK (KITS) ×1 IMPLANT
LIGHT WAVEGUIDE WIDE FLAT (MISCELLANEOUS) IMPLANT
NDL HYPO 25GX1X1/2 BEV (NEEDLE) ×1 IMPLANT
NEEDLE HYPO 25GX1X1/2 BEV (NEEDLE) ×1 IMPLANT
NS IRRIG 1000ML POUR BTL (IV SOLUTION) IMPLANT
PACK GENERAL/GYN (CUSTOM PROCEDURE TRAY) ×1 IMPLANT
STRIP CLOSURE SKIN 1/2X4 (GAUZE/BANDAGES/DRESSINGS) ×1 IMPLANT
SUT MNCRL AB 4-0 PS2 18 (SUTURE) ×1 IMPLANT
SUT SILK 2 0 SH (SUTURE) IMPLANT
SUT VIC AB 2-0 SH 27 (SUTURE) ×1
SUT VIC AB 2-0 SH 27XBRD (SUTURE) ×1 IMPLANT
SUT VIC AB 3-0 SH 27 (SUTURE) ×3
SUT VIC AB 3-0 SH 27X BRD (SUTURE) ×1 IMPLANT
SYR CONTROL 10ML LL (SYRINGE) ×1 IMPLANT
TOWEL GREEN STERILE (TOWEL DISPOSABLE) ×1 IMPLANT
TOWEL GREEN STERILE FF (TOWEL DISPOSABLE) ×1 IMPLANT

## 2022-04-07 NOTE — Anesthesia Preprocedure Evaluation (Addendum)
Anesthesia Evaluation  Patient identified by MRN, date of birth, ID band Patient awake    Reviewed: Allergy & Precautions, NPO status , Patient's Chart, lab work & pertinent test results  History of Anesthesia Complications (+) PONV  Airway Mallampati: II  TM Distance: >3 FB Neck ROM: Full    Dental  (+) Dental Advisory Given, Caps   Pulmonary    breath sounds clear to auscultation       Cardiovascular hypertension (pt no longer takes her BP meds),  Rhythm:Regular Rate:Normal  '16 ECHO: mild LVH. Systolic function was vigorous. EF 65-70%. Doppler parameters are consistent with abnormal left ventricular relaxation, grade 1 DD, no significant valvular abnormalities    Neuro/Psych  Headaches   Depression    H/o transsphenoidal hypophysectomy for pit mass    GI/Hepatic Neg liver ROS,GERD  Controlled,,H/o colon cancer   Endo/Other  negative endocrine ROS    Renal/GU negative Renal ROS     Musculoskeletal  (+) Arthritis ,    Abdominal   Peds  Hematology negative hematology ROS (+)   Anesthesia Other Findings   Reproductive/Obstetrics                              Anesthesia Physical Anesthesia Plan  ASA: 3  Anesthesia Plan: General   Post-op Pain Management: Tylenol PO (pre-op)*   Induction:   PONV Risk Score and Plan: 4 or greater and Ondansetron, Dexamethasone and Treatment may vary due to age or medical condition  Airway Management Planned: LMA  Additional Equipment: None  Intra-op Plan:   Post-operative Plan:   Informed Consent: I have reviewed the patients History and Physical, chart, labs and discussed the procedure including the risks, benefits and alternatives for the proposed anesthesia with the patient or authorized representative who has indicated his/her understanding and acceptance.     Dental advisory given  Plan Discussed with: CRNA and Surgeon  Anesthesia  Plan Comments:          Anesthesia Quick Evaluation

## 2022-04-07 NOTE — Anesthesia Procedure Notes (Signed)
Procedure Name: LMA Insertion Date/Time: 04/07/2022 3:44 PM  Performed by: Harden Mo, CRNAPre-anesthesia Checklist: Patient identified, Emergency Drugs available, Suction available and Patient being monitored Patient Re-evaluated:Patient Re-evaluated prior to induction Oxygen Delivery Method: Circle System Utilized Preoxygenation: Pre-oxygenation with 100% oxygen Induction Type: IV induction LMA: LMA inserted LMA Size: 4.0 Number of attempts: 1 Airway Equipment and Method: Bite block Placement Confirmation: positive ETCO2 Tube secured with: Tape Dental Injury: Teeth and Oropharynx as per pre-operative assessment

## 2022-04-07 NOTE — Transfer of Care (Signed)
Immediate Anesthesia Transfer of Care Note  Patient: Jovani ALIMA NASER  Procedure(s) Performed: RIGHT BREAST LUMPECTOMY WITH RADIOACTIVE SEED LOCALIZATION X2 (Right: Breast)  Patient Location: PACU  Anesthesia Type:General  Level of Consciousness: drowsy  Airway & Oxygen Therapy: Patient Spontanous Breathing  Post-op Assessment: Report given to RN and Post -op Vital signs reviewed and stable  Post vital signs: Reviewed and stable  Last Vitals:  Vitals Value Taken Time  BP 137/58 04/07/22 1727  Temp    Pulse 72 04/07/22 1728  Resp 15 04/07/22 1728  SpO2 91 % 04/07/22 1728  Vitals shown include unvalidated device data.  Last Pain:  Vitals:   04/07/22 1324  TempSrc:   PainSc: 0-No pain         Complications: No notable events documented.

## 2022-04-07 NOTE — Discharge Instructions (Addendum)
[facility] Office Phone Number [phone]  BREAST BIOPSY/ PARTIAL MASTECTOMY: POST OP INSTRUCTIONS  Always review your discharge instruction sheet given to you by the facility where your surgery was performed.  IF YOU HAVE DISABILITY OR FAMILY LEAVE FORMS, YOU MUST BRING THEM TO THE OFFICE FOR PROCESSING.  DO NOT GIVE THEM TO YOUR DOCTOR.  Take 2 tylenol (acetominophen) three times a day for 3 days.  If you still have pain, add ibuprofen with food in between if able to take this (if you have kidney issues or stomach issues, do not take ibuprofen).  If both of those are not enough, add the narcotic pain pill.  If you find you are needing a lot of this overnight after surgery, call the next morning for a refill.    Prescriptions will not be filled after 5pm or on week-ends. Take your usually prescribed medications unless otherwise directed You should eat very light the first 24 hours after surgery, such as soup, crackers, pudding, etc.  Resume your normal diet the day after surgery. Most patients will experience some swelling and bruising in the breast.  Ice packs and a good support bra will help.  Swelling and bruising can take several days to resolve.  It is common to experience some constipation if taking pain medication after surgery.  Increasing fluid intake and taking a stool softener will usually help or prevent this problem from occurring.  A mild laxative (Milk of Magnesia or Miralax) should be taken according to package directions if there are no bowel movements after 48 hours. Unless discharge instructions indicate otherwise, you may remove your bandages 48 hours after surgery, and you may shower at that time.  You may have steri-strips (small skin tapes) in place directly over the incision.  These strips should be left on the skin at least for for 7-10 days.    ACTIVITIES:  You may resume regular daily activities (gradually increasing) beginning the next day.  Wearing a  good support bra or sports bra (or the breast binder) minimizes pain and swelling.  You may have sexual intercourse when it is comfortable. No heavy lifting for 1-2 weeks (not over around 10 pounds).  You may drive when you no longer are taking prescription pain medication, you can comfortably wear a seatbelt, and you can safely maneuver your car and apply brakes. RETURN TO WORK:  __________3-14 days depending on job. _______________ You should see your doctor in the office for a follow-up appointment approximately two weeks after your surgery.  Your doctor's nurse will typically make your follow-up appointment when she calls you with your pathology report.  Expect your pathology report 3-4 business days after your surgery.  You may call to check if you do not hear from us after three days.   WHEN TO CALL YOUR DOCTOR: Fever over 101.0 Nausea and/or vomiting. Extreme swelling or bruising. Continued bleeding from incision. Increased pain, redness, or drainage from the incision.  The clinic staff is available to answer your questions during regular business hours.  Please don't hesitate to call and ask to speak to one of the nurses for clinical concerns.  If you have a medical emergency, go to the nearest emergency room or call 911.  A surgeon from [facility] is always on call at the hospital.  For further questions, please visit [email or web address]   

## 2022-04-07 NOTE — Op Note (Signed)
Right Breast Radioactive seed localized lumpectomy x 2  Indications: This patient presents with history of right breast cancer x 2; dominant mass grade 2 IDC, cT1cN0 upper inner quadrant, +/+/- receptors; secondary mass grade 3 ILC cT1bN0, upper outer quadrant, +/+/-  Pre-operative Diagnosis: right breast cancer two sites  Post-operative Diagnosis: same  Surgeon: Stark Klein   Anesthesia: General endotracheal anesthesia  ASA Class: 3  Procedure Details  The patient was seen in the Holding Room. The risks, benefits, complications, treatment options, and expected outcomes were discussed with the patient. The possibilities of bleeding, infection, the need for additional procedures, failure to diagnose a condition, and creating a complication requiring other procedures or operations were discussed with the patient. The patient concurred with the proposed plan, giving informed consent.  The site of surgery properly noted/marked. The patient was taken to Operating Room # 2, identified, and the procedure verified as Right breast seed localized lumpectomy x 2  The right breast and chest were prepped and draped in standard fashion. The dominant lesion was addressed first. A medial circumareolar incision was made near the previously placed radioactive seed.  Dissection was carried down around the point of maximum signal intensity. The cautery was used to perform the dissection.   The specimen was inked with the margin marker paint kit.    Specimen radiography confirmed inclusion of the mammographic lesion, the clip, and the seed.  The background signal in the breast was zero.  Hemostasis was achieved with cautery.  The cavity was marked with clips.   The 11 o'clock lesion was then addressed.  It was too far from the areolar border and on the opposite side of the breast to use the same incision.  A small transverse lateral incision was made just over the seed.   Dissection was carried down around the point  of maximum signal intensity. The cautery was used to perform the dissection.   The specimen was inked with the margin marker paint kit.    Specimen radiography confirmed inclusion of the mammographic lesion, the clip, and the seed.  The background signal in the breast was zero.  Hemostasis was achieved with cautery.  The cavity was marked with clips at the edges.   The wounds were irrigated and closed with 3-0 vicryl interrupted deep dermal sutures and 4-0 monocryl running subcuticular suture.      Sterile dressings were applied. At the end of the operation, all sponge, instrument, and needle counts were correct.   Findings: 1 seed and 1 clip in each specimen.  Anterior margins were skin, posterior margins were pectoralis.     Estimated Blood Loss:  min         Specimens: right breast lumpectomy with seed 3 o'clock.  Right breast lumpectomy with seed 11 o'clock.           Complications:  None; patient tolerated the procedure well.         Disposition: PACU - hemodynamically stable.         Condition: stable

## 2022-04-07 NOTE — H&P (Signed)
REFERRING PHYSICIAN: Lovey Newcomer  PROVIDER: Georgianne Fick, MD  Care Team: Patient Care Team: Alesia Richards, MD as PCP - General (Internal Medicine)  MRN: [id number] DOB: Jul 30, 1940 DATE OF ENCOUNTER: 04/01/2022  Subjective  Chief Complaint: New Consultation and Breast Cancer   History of Present Illness: Kristen [name] is a 81 y.o. female who is seen today as an office consultation at the request of Dr. Melford Aase for evaluation of New Consultation and Breast Cancer .  Patient presents with a new right breast cancer October 2023. She had a palpable mass for several months and underwent diagnostic imaging. She had 2 findings in the right breast. There is a 1.6 cm mass at 3:00 and a 9 mm mass at 11:00. The left breast was clear. The 3:00 mass was a dominant mass. This was a grade 2 invasive ductal carcinoma with ductal carcinoma in situ. This is strongly ER and PR positive, HER2 negative, Ki-67 of 5%. The 11:00 mass showed invasive lobular carcinoma, grade 2, also hormone receptor positive, HER2 negative, Ki-67 of 2%.  The patient does not have any previous personal cancer before this. Her brother had colon cancer. She denies any heart or lung issues other than blood pressure. [place] She is accompanied by her son. She does like to get out in the yard, but mostly works with the [employer]. 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  Family cancer history - brother colon cancer  Work: homemaker  Diagnostic mammogram:/us BCG 03/10/2022 ACR Breast Density Category b: There are scattered areas of fibroglandular density.  FINDINGS: Mammogram:  Right breast: A skin BB marks the palpable site of concern reported by the patient in the medial right breast. A spot tangential view of this area was performed in addition to standard views. At the palpable site there is an irregular spiculated mass measuring approximately 1.2 cm.  There is an additional irregular mass with distortion in the upper outer right breast posterior depth measuring approximately 0.8 cm.  Left breast: No suspicious mass, distortion, or microcalcifications are identified to suggest presence of malignancy.  On physical exam at the site of concern reported by the patient in the medial right breast I feel a fixed discrete mass.  Ultrasound:  Targeted ultrasound is performed at the palpable site of concern reported by the patient in the right breast at 3 o'clock 4 cm from the nipple demonstrating an irregular hypoechoic mass measuring 1.4 x 1.0 x 1.6 cm. This corresponds to the mammographic finding.  Targeted ultrasound the right breast at 11 o'clock 7 cm from the nipple demonstrates an irregular hypoechoic mass measuring 0.9 x 0.9 x 0.7 cm. This corresponds to the second mammographic finding.  Targeted ultrasound of the right axilla demonstrates normal lymph nodes.  IMPRESSION: 1. At the palpable site of concern in the right breast at 3 o'clock there is a highly suspicious mass measuring 1.6 cm.  2. In the right breast at 11 o'clock there is another highly suspicious mass measuring 0.9 cm.  3. No mammographic evidence of malignancy in the left breast.  RECOMMENDATION: Ultrasound-guided core needle biopsy x2 of the right breast.  I have discussed the findings and recommendations with the patient who agrees to proceed with biopsy. The patient will be scheduled for the biopsy appointment prior to leaving the office today.  BI-RADS CATEGORY 5: Highly suggestive of malignancy.  Pathology core needle biopsy: 03/24/2022 1. Breast, right, needle  core biopsy, 3:00 INVASIVE MODERATELY DIFFERENTIATED DUCTAL ADENOCARCINOMA, GRADE 2 (3+2+1) DUCTAL CARCINOMA IN SITU, INTERMEDIATE NUCLEAR GRADE, SOLID AND CRIBRIFORM TYPES WITHOUT NECROSIS TUBULE FORMATION 3 NUCLEAR PLEOMORPHISM 2 MITOTIC COUNT 1 TOTAL SCORE 6 OVERALL GRADE  2 MICROCALCIFICATIONS PRESENT WITHIN DCIS AND PERITUMORAL STROMA NEGATIVE FOR LYMPHOVASCULAR INVASION TUMOR MEASURES 10 MM IN GREATEST LINEAR EXTENT  2. Breast, right, needle core biopsy, 11:00 INVASIVE LOBULAR CARCINOMA, GRADE 2 (3+2+1) TUBULE FORMATION 3 NUCLEAR PLEOMORPHISM 2 MITOTIC COUNT 1 TOTAL SCORE 6 OVERALL GRADE 2 NEGATIVE FOR MICROCALCIFICATIONS NEGATIVE FOR LYMPHOVASCULAR INVASION TUMOR MEASURES 7 MM IN GREATEST LINEAR EXTENT Microscopic Comment 1. An immunohistochemical stain for E-cadherin performed with adequate control is positive within the tumor supporting a ductal differentiation. 2. An immunohistochemical stain for E-cadherin performed with adequate control is negative within the tumor supporting a lobular differentiation.  Receptors Estrogen Receptor: 100%, POSITIVE, STRONG STAINING INTENSITY Progesterone Receptor: 1%, POSITIVE, STRONG STAINING INTENSITY Proliferation Marker Ki67: 5% GROUP 5: HER2 **NEGATIVE** 2. The tumor cells are negative for Her2 (0). Estrogen Receptor: 95%, POSITIVE, STRONG STAINING INTENSITY Progesterone Receptor: 20%, POSITIVE, STRONG STAINING INTENSITY Proliferation Marker Ki67: 2%  Review of Systems: A complete review of systems was obtained from the patient. I have reviewed this information and discussed as appropriate with the patient. See HPI as well for other ROS. ROS - positive for chills, weight loss, hearing loss, nose bleeds, eye pain, nausea, dizziness, weakness, anxiety, memory loss.  Medical History: History reviewed. No pertinent past medical history.  Patient Active Problem List Diagnosis Malignant neoplasm of upper-outer quadrant of right breast in female, estrogen receptor positive Malignant neoplasm of lower-inner quadrant of right breast of female, estrogen receptor positive PONV (postoperative nausea and vomiting)  Past Surgical History: Procedure Laterality Date knee surgery   Allergies Allergen  Reactions Aspirin Hives and Vomiting High Doses stomach trouble  No current outpatient medications on file prior to visit.  No current facility-administered medications on file prior to visit.  Family History Problem Relation Age of Onset Colon cancer Brother   Social History  Tobacco Use Smoking Status Never Smokeless Tobacco Never   Social History  Socioeconomic History Marital status: Widowed Tobacco Use Smoking status: Never Smokeless tobacco: Never Substance and Sexual Activity Alcohol use: Not Currently Drug use: Never  Objective:  Vitals: 04/01/22 1142 BP: 125/80 Pulse: 87 Temp: 36.1 C (97 F) SpO2: 96% Weight: 63.5 kg (140 lb) Height: 165.1 cm (_0 )  Body mass index is 23.3 kg/m.  Gen: No acute distress. Well nourished and well groomed. Neurological: Alert and oriented to person, place, and time. Coordination normal. Head: Normocephalic and atraumatic. Eyes: Conjunctivae are normal. Pupils are equal, round, and reactive to light. No scleral icterus. Neck: Normal range of motion. Neck supple. No tracheal deviation or thyromegaly present. Cardiovascular: Normal rate, regular rhythm, normal heart sounds and intact distal pulses. Exam reveals no gallop and no friction rub. No murmur heard. Breast: Right breast with palpable 1.5 cm mass at 3 o'clock. Mobile, non tender. No other palpable abnormalities in either breast. No LAD. No nipple retraction or discharge. No skin dimpling. Breasts relatively symmetric. Respiratory: Effort normal. No respiratory distress. No chest wall tenderness. Breath sounds normal. No wheezes, rales or rhonchi. GI: Soft. Bowel sounds are normal. The abdomen is soft and nontender. There is no rebound and no guarding. Musculoskeletal: Normal range of motion. Extremities are nontender. Lymphadenopathy: No cervical, preauricular, postauricular or axillary adenopathy is present Skin: Skin is warm and dry. No rash noted. No  diaphoresis.  No erythema. No pallor. No clubbing, cyanosis, or edema. Psychiatric: Normal mood and affect. Behavior is normal. Judgment and thought content normal.  Labs Cbc, CMET normal 03/28/2022  Assessment and Plan:  ICD-10-CM 1. Malignant neoplasm of upper-outer quadrant of right breast in female, estrogen receptor positive C50.411 Z17.0  2. Malignant neoplasm of lower-inner quadrant of right breast of female, estrogen receptor positive C50.311 Z17.0  3. PONV (postoperative nausea and vomiting) R11.2 Z98.890   Patient is a candidate for breast conservation. These are both small tumors that have a favorable prognostic profile. I would plan to do 2 lumpectomies on the right. Given the size of these tumor and the prognostic profile, I would not plan to do a sentinel node biopsy. This is also due to her age. She already has significant shoulder difficulties on the other side with range of motion despite having gone to PT already.  I will refer her to medical and radiation oncology postoperatively.  The surgical procedure was described to the patient. I discussed the incision type and location and that we will need radiology involved on with multiple seed markers and/or sentinel node.  We discussed the risks bleeding, infection, damage to other structures, need for further procedures/surgeries. We discussed the risk of seroma. The patient was advised if the breast has cancer, we may need to go back to surgery for additional tissue to obtain negative margins or for a lymph node biopsy. The patient was advised that these are the most common complications, but that others can occur as well. I discussed the risk of alteration in breast contour or size. I discussed risk of chronic pain. There are rare instances of heart/lung issues post op as well as blood clots.  They were advised against taking aspirin or other anti-inflammatory agents/blood thinners the week before surgery.  The risks and  benefits of the procedure were described to the patient and she wishes to proceed.

## 2022-04-07 NOTE — Interval H&P Note (Signed)
History and Physical Interval Note:  04/07/2022 3:18 PM  Kristen [name]  has presented today for surgery, with the diagnosis of RIGHT BREAST CANCER.  The various methods of treatment have been discussed with the patient and family. After consideration of risks, benefits and other options for treatment, the patient has consented to  Procedure(s): RIGHT BREAST LUMPECTOMY WITH RADIOACTIVE SEED LOCALIZATION X2 (Right) as a surgical intervention.  The patient's history has been reviewed, patient examined, no change in status, stable for surgery.  I have reviewed the patient's chart and labs.  Questions were answered to the patient's satisfaction.     Stark Klein

## 2022-04-08 ENCOUNTER — Encounter (HOSPITAL_COMMUNITY): Payer: Self-pay | Admitting: General Surgery

## 2022-04-08 NOTE — Anesthesia Postprocedure Evaluation (Signed)
Anesthesia Post Note  Patient: Kristen [name]  Procedure(s) Performed: RIGHT BREAST LUMPECTOMY WITH RADIOACTIVE SEED LOCALIZATION X2 (Right: Breast)     Patient location during evaluation: PACU Anesthesia Type: General Level of consciousness: sedated and patient cooperative Pain management: pain level controlled Vital Signs Assessment: post-procedure vital signs reviewed and stable Respiratory status: spontaneous breathing Cardiovascular status: stable Anesthetic complications: no   No notable events documented.  Last Vitals:  Vitals:   04/07/22 1745 04/07/22 1800  BP: 130/62 136/65  Pulse: 70 72  Resp: 14 17  Temp:  36.7 C  SpO2: 95% 95%    Last Pain:  Vitals:   04/07/22 1745  TempSrc:   PainSc: Asleep   Pain Goal:                   Nolon Nations

## 2022-04-11 LAB — SURGICAL PATHOLOGY

## 2022-04-24 ENCOUNTER — Emergency Department (HOSPITAL_COMMUNITY)
Admission: EM | Admit: 2022-04-24 | Discharge: 2022-04-24 | Disposition: A | Discharge status: Home / Self Care | Payer: PPO | Attending: Emergency Medicine | Admitting: Emergency Medicine

## 2022-04-24 ENCOUNTER — Other Ambulatory Visit: Payer: Self-pay

## 2022-04-24 ENCOUNTER — Encounter (HOSPITAL_COMMUNITY): Payer: Self-pay | Admitting: *Deleted

## 2022-04-24 ENCOUNTER — Emergency Department (HOSPITAL_COMMUNITY): Payer: PPO

## 2022-04-24 DIAGNOSIS — R0689 Other abnormalities of breathing: Secondary | ICD-10-CM | POA: Diagnosis not present

## 2022-04-24 DIAGNOSIS — I959 Hypotension, unspecified: Secondary | ICD-10-CM | POA: Diagnosis not present

## 2022-04-24 DIAGNOSIS — R6883 Chills (without fever): Secondary | ICD-10-CM | POA: Diagnosis not present

## 2022-04-24 DIAGNOSIS — Z20822 Contact with and (suspected) exposure to covid-19: Secondary | ICD-10-CM | POA: Diagnosis not present

## 2022-04-24 DIAGNOSIS — T68XXXA Hypothermia, initial encounter: Secondary | ICD-10-CM | POA: Diagnosis not present

## 2022-04-24 DIAGNOSIS — R079 Chest pain, unspecified: Secondary | ICD-10-CM | POA: Diagnosis not present

## 2022-04-24 DIAGNOSIS — I10 Essential (primary) hypertension: Secondary | ICD-10-CM | POA: Diagnosis not present

## 2022-04-24 DIAGNOSIS — M25512 Pain in left shoulder: Secondary | ICD-10-CM | POA: Insufficient documentation

## 2022-04-24 DIAGNOSIS — M542 Cervicalgia: Secondary | ICD-10-CM

## 2022-04-24 HISTORY — DX: Malignant neoplasm of unspecified site of right female breast: C50.911

## 2022-04-24 LAB — COMPREHENSIVE METABOLIC PANEL
ALT: 12 U/L (ref 0–44)
AST: 19 U/L (ref 15–41)
Albumin: 3.9 g/dL (ref 3.5–5.0)
Alkaline Phosphatase: 57 U/L (ref 38–126)
Anion gap: 6 (ref 5–15)
BUN: 10 mg/dL (ref 8–23)
CO2: 24 mmol/L (ref 22–32)
Calcium: 9.2 mg/dL (ref 8.9–10.3)
Chloride: 109 mmol/L (ref 98–111)
Creatinine, Ser: 0.58 mg/dL (ref 0.44–1.00)
GFR, Estimated: >60 mL/min (ref >60)
Glucose, Bld: 94 mg/dL (ref 70–99)
Potassium: 3.9 mmol/L (ref 3.5–5.1)
Sodium: 139 mmol/L (ref 135–145)
Total Bilirubin: 0.4 mg/dL (ref 0.3–1.2)
Total Protein: 6.4 g/dL — ABNORMAL LOW (ref 6.5–8.1)

## 2022-04-24 LAB — CBC WITH DIFFERENTIAL/PLATELET
Abs Immature Granulocytes: 0.02 10*3/uL (ref 0.00–0.07)
Basophils Absolute: 0.1 10*3/uL (ref 0.0–0.1)
Basophils Relative: 1 %
Eosinophils Absolute: 0.2 10*3/uL (ref 0.0–0.5)
Eosinophils Relative: 4 %
HCT: 32.9 % — ABNORMAL LOW (ref 36.0–46.0)
Hemoglobin: 10.9 g/dL — ABNORMAL LOW (ref 12.0–15.0)
Immature Granulocytes: 0 %
Lymphocytes Relative: 23 %
Lymphs Abs: 1.5 10*3/uL (ref 0.7–4.0)
MCH: 30 pg (ref 26.0–34.0)
MCHC: 33.1 g/dL (ref 30.0–36.0)
MCV: 90.6 fL (ref 80.0–100.0)
Monocytes Absolute: 0.4 10*3/uL (ref 0.1–1.0)
Monocytes Relative: 6 %
Neutro Abs: 4.4 10*3/uL (ref 1.7–7.7)
Neutrophils Relative %: 66 %
Platelets: 239 10*3/uL (ref 150–400)
RBC: 3.63 MIL/uL — ABNORMAL LOW (ref 3.87–5.11)
RDW: 13.8 % (ref 11.5–15.5)
WBC: 6.6 10*3/uL (ref 4.0–10.5)
nRBC: 0 % (ref 0.0–0.2)

## 2022-04-24 LAB — URINALYSIS, ROUTINE W REFLEX MICROSCOPIC
Bilirubin Urine: NEGATIVE
Glucose, UA: NEGATIVE mg/dL
Hgb urine dipstick: NEGATIVE
Ketones, ur: NEGATIVE mg/dL
Leukocytes,Ua: NEGATIVE
Nitrite: NEGATIVE
Protein, ur: NEGATIVE mg/dL
Specific Gravity, Urine: 1.01 (ref 1.005–1.030)
pH: 8 (ref 5.0–8.0)

## 2022-04-24 LAB — RESP PANEL BY RT-PCR (FLU A&B, COVID) ARPGX2
Influenza A by PCR: NEGATIVE
Influenza B by PCR: NEGATIVE
SARS Coronavirus 2 by RT PCR: NEGATIVE

## 2022-04-24 LAB — LACTIC ACID, PLASMA: Lactic Acid, Venous: 0.8 mmol/L (ref 0.5–1.9)

## 2022-04-24 LAB — TROPONIN I (HIGH SENSITIVITY): Troponin I (High Sensitivity): 4 ng/L (ref <18)

## 2022-04-24 MED ORDER — KETOROLAC TROMETHAMINE 30 MG/ML IJ SOLN
30.0000 mg | Freq: Once | INTRAMUSCULAR | Status: AC
  Administered 2022-04-24: 30 mg via INTRAVENOUS
  Filled 2022-04-24: qty 1

## 2022-04-24 NOTE — Discharge Instructions (Addendum)
Please follow-up with your primary care doctor's office for the symptoms you are experiencing.  It is possible that your neck pain was from a muscle injury or spasm.  Your pain got better with NSAID medications, and you can consider taking an ibuprofen or Aleve, as needed, at home.  We do not see any signs of any serious infection or heart condition or other medical emergencies at this time.

## 2022-04-24 NOTE — ED Provider Notes (Signed)
[facility] EMERGENCY DEPARTMENT Provider Note   CSN: [id number] Arrival date & time: 04/24/22  1812     History  Chief Complaint  Patient presents with   Neck Pain    Kristen [name] is a 81 y.o. female presenting to ED with shaking chills and neck pain.  Patient reports that she woke up after sitting upright on the couch and sleeping with pain in her left posterior neck, and also uncontrollable shaking chills.  The pain is since gotten better in her neck, and is now mostly in her left shoulder blade and worse with overhead arm movement.  However she continues to have on and off shaking chills all day.  She denies cough, congestion, headache, blurred vision, photophobia, diarrhea or abdominal pain.  Denies sick contacts at home.  She and her family member at bedside denies any prior history of sepsis or UTIs.  HPI     Home Medications Prior to Admission medications   Medication Sig Start Date End Date Taking? Authorizing Provider  amLODipine (NORVASC) 2.5 MG tablet Take  1 tablet  Daily for BP                                                             /                                                           Take                                 by                                  mouth                                  once daily 02/25/22   Unk Pinto, MD  atorvastatin (LIPITOR) 40 MG tablet Take  1 tablet  Daily  for Cholesterol Patient not taking: Reported on 04/04/2022 05/24/21   Unk Pinto, MD  bisoprolol-hydrochlorothiazide [facility]) 2.5-6.25 MG tablet Take  1 tablet  Daily   for BP                                                                        /                                             Take  by                        mouth                                once daily 02/25/22   Unk Pinto, MD  Cholecalciferol 50 MCG (2000 UT) CHEW Chew 6,000 Units by mouth daily. Gummies    [provider]  meclizine (ANTIVERT) 25 MG  tablet Take 1 tablet 2 to 3 x /day as needed for Vertigo                                          /                        TAKE                     BY                         MOUTH 10/04/21   Unk Pinto, MD  naproxen (NAPROSYN) 375 MG tablet Take 1 tablet (375 mg total) by mouth 2 (two) times daily. Take with food Patient taking differently: Take 375 mg by mouth 2 (two) times daily as needed for moderate pain or mild pain. Take with food 02/14/22   Triplett, Tammy, PA-C  olmesartan (BENICAR) 40 MG tablet Take  1 tablet  at Night  for BP                                                            /                                                     TAKE                                           BY                              MOUTH 02/25/22   Unk Pinto, MD  oxyCODONE (OXY IR/ROXICODONE) 5 MG immediate release tablet Take 0.5-1 tablets (2.5-5 mg total) by mouth every 6 (six) hours as needed for severe pain. 04/07/22   Stark Klein, MD  PARoxetine (PAXIL) 30 MG tablet Take  1 tablet Daily for Mood                                        /                                   TAKE  BY                                  MOUTH                                    ONCE DAILY 02/25/22   Unk Pinto, MD  traMADol (ULTRAM) 50 MG tablet Take 1 tablet (50 mg total) by mouth every 6 (six) hours as needed. Patient not taking: Reported on 02/21/2022 02/14/22   Kem Parkinson, PA-C      Allergies    Aspirin, Codeine, Fenofibrate, Gabapentin, Hydrochlorothiazide, and Metoclopramide    Review of Systems   Review of Systems  Physical Exam Updated Vital Signs BP 130/61   Pulse 63   Temp 98.3 F (36.8 C) (Oral)   Resp 14   Ht '5\' 5"'$  (1.651 m)   Wt 64.4 kg   SpO2 99%   BMI 23.63 kg/m  Physical Exam Constitutional:      General: She is not in acute distress. HENT:     Head: Normocephalic and atraumatic.  Eyes:     Conjunctiva/sclera: Conjunctivae normal.      Pupils: Pupils are equal, round, and reactive to light.     Comments: No photophobia.  Cardiovascular:     Rate and Rhythm: Normal rate and regular rhythm.  Pulmonary:     Effort: Pulmonary effort is normal. No respiratory distress.  Abdominal:     General: There is no distension.     Tenderness: There is no abdominal tenderness.  Musculoskeletal:     Comments: Patient is trigger point tenderness suprascapular left shoulder, worse with overhead arm raise.  No nuchal rigidity.  Skin:    General: Skin is warm and dry.  Neurological:     General: No focal deficit present.     Mental Status: She is alert. Mental status is at baseline.  Psychiatric:        Mood and Affect: Mood normal.        Behavior: Behavior normal.     ED Results / Procedures / Treatments   Labs (all labs ordered are listed, but only abnormal results are displayed) Labs Reviewed  COMPREHENSIVE METABOLIC PANEL - Abnormal; Notable for the following components:      Result Value   Total Protein 6.4 (*)    All other components within normal limits  CBC WITH DIFFERENTIAL/PLATELET - Abnormal; Notable for the following components:   RBC 3.63 (*)    Hemoglobin 10.9 (*)    HCT 32.9 (*)    All other components within normal limits  RESP PANEL BY RT-PCR (FLU A&B, COVID) ARPGX2  LACTIC ACID, PLASMA  URINALYSIS, ROUTINE W REFLEX MICROSCOPIC  TROPONIN I (HIGH SENSITIVITY)    EKG EKG Interpretation  Date/Time:  Sunday April 24 2022 20:18:28 EST Ventricular Rate:  60 PR Interval:  214 QRS Duration: 84 QT Interval:  511 QTC Calculation: 511 R Axis:   53 Text Interpretation: Sinus rhythm Borderline prolonged PR interval Low voltage, extremity and precordial leads Prolonged QT interval Confirmed by Octaviano Glow [id number]) on 04/24/2022 8:28:07 PM  Radiology DG Chest 2 View  Result Date: 04/24/2022 CLINICAL DATA:  Neck pain EXAM: CHEST - 2 VIEW COMPARISON:  Chest x-ray 10/16/2015 FINDINGS: The heart size and  mediastinal contours are within normal limits. Both lungs are clear. The visualized skeletal structures are  unremarkable. There surgical clips in the right anterior chest wall. IMPRESSION: No active cardiopulmonary disease. Electronically Signed   By: Ronney Asters M.D.   On: 04/24/2022 19:49    Procedures Procedures    Medications Ordered in ED Medications  ketorolac (TORADOL) 30 MG/ML injection 30 mg (30 mg Intravenous Given 04/24/22 2001)    ED Course/ Medical Decision Making/ A&P                           Medical Decision Making Amount and/or Complexity of Data Reviewed Labs: ordered. Radiology: ordered. ECG/medicine tests: ordered.  Risk Prescription drug management.   This patient presents to the ED with concern for shaking chills, neck pain. This involves an extensive number of treatment options, and is a complaint that carries with it a high risk of complications and morbidity.  The differential diagnosis includes infection/rigors for shaking chills versus other.  We will plan for an infection work-up including COVID and flu swab, UTI, pneumonia evaluation.  Her vital signs are otherwise normal she does not have SIRS criteria concern for sepsis, nor any clear nidus of blood infection.  No open sores.  No embedded lines.  Her neck pain is strongly consistent with muscular pain.  It began after she had fallen asleep in an awkward position.  It is also reproducible with palpation above her left shoulder and also overhead arm raise.  I suspect this is a muscle strain.  We will try some Toradol for this.   Additional history obtained from the patient's son at bedside  I ordered and personally interpreted labs.  The pertinent results include: No acute emergent findings.  Troponin unremarkable.  UA without sign of infection.  COVID and flu are negative  I ordered imaging studies including x-ray of the chest I independently visualized and interpreted imaging which showed no acute  abnormality or pneumonia I agree with the radiologist interpretation  The patient was maintained on a cardiac monitor.  I personally viewed and interpreted the cardiac monitored which showed an underlying rhythm of: Sinus rhythm with occasional borderline bradycardia  Per my interpretation the patient's ECG shows sinus rhythm with no acute ischemic findings  I ordered medication including Toradol for neck pain.  I have reviewed the patients home medicines and have made adjustments as needed  Test Considered: Very low suspicion for acute meningitis or intracranial hemorrhage.  I do not see an indication for LP.  She is no photophobia, nuchal rigidity.  No fever.  After the interventions noted above, I reevaluated the patient and found that they have: improved -she was completely asymptomatic, shoulder pain had resolved completely   Dispostion:  After consideration of the diagnostic results and the patients response to treatment, I feel that the patent would benefit from outpatient PCP follow-up         Final Clinical Impression(s) / ED Diagnoses Final diagnoses:  Shaking chills  Neck pain    Rx / DC Orders ED Discharge Orders     None         Wyvonnia Dusky, MD 04/24/22 2157

## 2022-04-24 NOTE — ED Triage Notes (Signed)
Pt brought in by [facility] from home with c/o intermittent posterior neck pain, bilateral heel pain and feeling cold ever since she had a right breast lumpectomy with radioactive seed localization surgery on 04/07/22.

## 2022-04-25 ENCOUNTER — Telehealth: Payer: Self-pay | Admitting: *Deleted

## 2022-04-25 ENCOUNTER — Inpatient Hospital Stay: Payer: PPO | Attending: Oncology | Admitting: Oncology

## 2022-04-25 NOTE — Telephone Encounter (Signed)
"  No show" for new patient appointment today. Left VM with patient son asking for return call--are you on the way or need to reschedule?

## 2022-04-26 ENCOUNTER — Telehealth: Payer: Self-pay | Admitting: *Deleted

## 2022-05-02 ENCOUNTER — Telehealth: Payer: Self-pay

## 2022-05-02 NOTE — Telephone Encounter (Signed)
        Patient  visited Dor[facility] Fray on 11/19    Telephone encounter attempt :  1st  A HIPAA compliant voice message was left requesting a return call.  Instructed patient to call back   [place] [place] [facility]  [phone] [address], [place], [place] [address] Phone: [phone] Email: [email or web address]

## 2022-05-04 ENCOUNTER — Ambulatory Visit: Payer: Self-pay | Admitting: Nurse Practitioner

## 2022-05-05 ENCOUNTER — Encounter (HOSPITAL_COMMUNITY): Payer: Self-pay

## 2022-05-11 ENCOUNTER — Other Ambulatory Visit: Payer: Self-pay | Admitting: *Deleted

## 2022-05-11 ENCOUNTER — Inpatient Hospital Stay: Payer: PPO

## 2022-05-11 ENCOUNTER — Inpatient Hospital Stay: Payer: PPO | Attending: Oncology | Admitting: Oncology

## 2022-05-11 ENCOUNTER — Encounter: Payer: Self-pay | Admitting: *Deleted

## 2022-05-11 VITALS — BP 161/64 | HR 66 | Temp 97.8°F | Resp 16 | Ht 65.0 in | Wt 139.2 lb

## 2022-05-11 DIAGNOSIS — Z170 Estrogen receptor positive status [ER+]: Secondary | ICD-10-CM

## 2022-05-11 DIAGNOSIS — Z23 Encounter for immunization: Secondary | ICD-10-CM | POA: Insufficient documentation

## 2022-05-11 DIAGNOSIS — C50811 Malignant neoplasm of overlapping sites of right female breast: Secondary | ICD-10-CM

## 2022-05-11 DIAGNOSIS — C50919 Malignant neoplasm of unspecified site of unspecified female breast: Secondary | ICD-10-CM | POA: Insufficient documentation

## 2022-05-11 MED ORDER — INFLUENZA VAC A&B SA ADJ QUAD 0.5 ML IM PRSY
0.5000 mL | PREFILLED_SYRINGE | Freq: Once | INTRAMUSCULAR | Status: AC
  Administered 2022-05-11: 0.5 mL via INTRAMUSCULAR
  Filled 2022-05-11: qty 0.5

## 2022-05-11 MED ORDER — DOXYCYCLINE HYCLATE 100 MG PO TABS: 100.0000 mg | ORAL_TABLET | Freq: Two times a day (BID) | ORAL | 0 refills | Status: DC

## 2022-05-11 MED ORDER — ONDANSETRON HCL 8 MG PO TABS: 8.0000 mg | ORAL_TABLET | Freq: Three times a day (TID) | ORAL | 0 refills | Status: DC | PRN

## 2022-05-11 MED ORDER — LETROZOLE 2.5 MG PO TABS: 2.5000 mg | ORAL_TABLET | Freq: Every day | ORAL | 1 refills | Status: DC

## 2022-05-11 NOTE — Progress Notes (Signed)
PATIENT NAVIGATOR PROGRESS NOTE  Name: Kristen [name] Date: 05/11/2022 MRN: [id number]  DOB: Nov 05, 1940   Reason for visit:  New Patient Appt  Comments:  Met with [name] and her son [name] Will begin anti estrogen Letrozole 2.5 mg a day. Dr Benay Spice prescribing doxycycline 144m BID for 7 days for cellulitis of right breast Reentered referral to radiation oncology for evaluation Referral to Social Work as pt lives alone     Time spent counseling/coordinating care: 45-60 minutes

## 2022-05-11 NOTE — Progress Notes (Signed)
Kristen [name]   Requesting MD: Kristen Klein, [address] [place],  [place] [address]   Kristen [name] 81 y.o.  January 31, 1941    Reason for [name]: Breast cancer   HPI: Kristen [name] palpated a mass in the right breast saw her primary provider.  She was referred for mammogram on 03/10/2022.  A 1.2 cm spiculated mass was noted at the palpable site medial right breast.  An additional irregular mass was noted in the upper outer right breast measuring 0.8 cm.  No suspicious mass in the left breast.  An ultrasound confirmed a 1.4 x 1 x 1.6 cm mass in the right breast at 3:00 4 cm from the nipple.  At the 11 o'clock position and 7 cm from the nipple an irregular mass measuring 0.90.9 0.7 cm.  Normal right axillary lymph nodes.  She underwent guided biopsies of both masses on 03/24/2022.  The 3:00 tumor returned as invasive moderately differentiated ductal adenocarcinoma, grade 2.  The 11:00 biopsy returned as invasive lobular carcinoma.  She was referred to Kristen [name] underwent a seed localized lumpectomy of both right breast masses on 04/07/2022.  Kristen [name] reports the right breast has been erythematous for the past several weeks.  She was seen in the emergency room neck pain and chills on 04/24/2022.  She was treated with Toradol. The breast remains erythematous and warm.  Kristen [name] is here today with her son.  They acknowledge she has significant memory loss.  Past Medical History:  Diagnosis Date   Arthritis    Breast cancer, right ([place])    Colon cancer ([place]) 1990s, 2014   Colon polyps    Depression    Dysrhythmia 04/06/2012   GERD (gastroesophageal reflux disease)    Headache(784.0)    hx migraines   Hypertension 1999   Pituitary mass ([place]) 04/06/2012   MASS FOUND AFTER PT EXPERIENCED BILATERAL BLURRED / HAZY VISION -ESP LEFT EYE.  S/P CRANIOTOMY AND EYESIGHT RETURNED TO NORMAL--PT STILL HAS A LOT OF TENDERNESS RIGHT SIDE OF NOSE  --THE CRANIOTOMY WAS DONE BY TRANSNASAL APPROACH   Pneumonia yrs ago   hx of pneumonia   PONV (postoperative nausea and vomiting)    PT STATES PLEASE NOTE SHE HAS TENDERNESS RIGHT SIDE OF NOSE - SINCE TRANSNASAL CRANIOTOMY 04/2012   Pre-diabetes    Prediabetes    Vitamin D deficiency   G5?  P3   Past Surgical History:  Procedure Laterality Date   BACK SURGERY     lower   BREAST LUMPECTOMY WITH RADIOACTIVE SEED LOCALIZATION Right 04/07/2022   Procedure: RIGHT BREAST LUMPECTOMY WITH RADIOACTIVE SEED LOCALIZATION X2;  Surgeon: Kristen Klein, MD;  Location: [facility];  Service: General;  Laterality: Right;   CATARACT EXTRACTION  June/july 2013   bilateral eyes   COLON RESECTION  1988   COLON SURGERY  1990's   COLONOSCOPY  09/20/2011   Procedure: COLONOSCOPY;  Surgeon: Missy Sabins, MD;  Location: [facility];  Service: Endoscopy;  Laterality: N/A;   COLONOSCOPY  12/15/2011   Procedure: COLONOSCOPY;  Surgeon: Missy Sabins, MD;  Location: [facility];  Service: Endoscopy;  Laterality: N/A;   COLONOSCOPY  01/26/2012   Procedure: COLONOSCOPY;  Surgeon: Cleotis Nipper, MD;  Location: [facility];  Service: Endoscopy;  Laterality: N/A;   CRANIOTOMY  04/18/2012   Procedure: CRANIOTOMY HYPOPHYSECTOMY TRANSNASAL APPROACH;  Surgeon: Ophelia Charter, MD;  Location: [facility];  Service: Neurosurgery;  Laterality: Bilateral;  Transphenoidal resection of Pituitary tumor.    CYSTOSCOPY WITH URETEROSCOPY AND STENT PLACEMENT Bilateral 10/01/2015   Procedure: CYSTOSCOPY, retrograde WITH URETEROSCOPY AND STENT PLACEMENT, ;  Surgeon: Raynelle Bring, MD;  Location: [facility];  Service: Urology;  Laterality: Bilateral;   CYSTOSCOPY WITH URETEROSCOPY AND STENT PLACEMENT Right 10/19/2015   Procedure: CYSTOSCOPY,  RIGHT URETEROSCOPY, HOLMIUM LASER RIGHT URETERAL STONE, RIGHT URETERAL STENT PLACEMENT, LEFT URETEROSCOPY, INSERTION LEFT URETERAL STENT;  Surgeon: Raynelle Bring, MD;  Location: [facility];  Service: Urology;   Laterality: Right;   EYE SURGERY     cataract bil   HOLMIUM LASER APPLICATION Right 6/71/2458   Procedure: HOLMIUM LASER  ;  Surgeon: Raynelle Bring, MD;  Location: [facility];  Service: Urology;  Laterality: Right;   HOT HEMOSTASIS  09/20/2011   Procedure: HOT HEMOSTASIS (ARGON PLASMA COAGULATION/BICAP);  Surgeon: Missy Sabins, MD;  Location: [facility];  Service: Endoscopy;  Laterality: N/A;   LAPAROSCOPIC RIGHT HEMI COLECTOMY  06/27/2012   Procedure: LAPAROSCOPIC RIGHT HEMI COLECTOMY;  Surgeon: Pedro Earls, MD;  Location: [facility];  Service: General;  Laterality: N/A;  Laparoscopic Assited Right Hemicolectomy   TRANSNASAL APPROACH  04/18/2012   Procedure: TRANSNASAL APPROACH;  Surgeon: Rozetta Nunnery, MD;  Location: [facility];  Service: ENT;  Laterality: Bilateral;  Transnasal approach   TUMOR REMOVAL     Left leg    Medications: Reviewed  Allergies:  Allergies  Allergen Reactions   Aspirin Other (See Comments)    High Doses stomach trouble   Codeine Nausea And Vomiting   Fenofibrate Cough   Gabapentin Other (See Comments)    Drowsiness   Hydrochlorothiazide Other (See Comments)    Fatigue   Metoclopramide Other (See Comments)    Sedation    Family history: Her sister, brother, and nephew had colon cancer.  Social History:   She lives with her grandson in [place].  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  She does not use cigarettes.  She drinks wine occasionally.  No risk factor for hepatitis.  ROS:   Positives include: Intermittent choking for years, better recently.  Anorexia following right breast surgery.  Erythema and warmth of the right breast for the past 2 weeks  A complete ROS was otherwise negative.  Physical Exam:  Blood pressure (!) 161/64, pulse 66, temperature 97.8 F (36.6 C), temperature source Oral, resp. rate 16, height _0  (1.651 m), weight 139 lb 3.2 oz (63.1 kg), SpO2 100 %.  HEENT: Oral cavity without visible mass, neck  without mass Lungs: Bilaterally Cardiac: The rate and rhythm Abdomen: No hepatosplenomegaly, nontender Breast: Healing medial and lateral lumpectomy scars at the right breast.  Diffuse erythema, edema, and warmth of the right breast.  No drainage.  Left breast without mass. Vascular: No leg edema Lymph nodes: No cervical, supraclavicular, axillary, or inguinal nodes Neurologic: Alert and oriented, the motor exam appears intact in the upper and lower extremities bilaterally Skin: No rash other than the right breast erythema Musculoskeletal: No spine tenderness   LAB:  CBC  Lab Results  Component Value Date   WBC 6.6 04/24/2022   HGB 10.9 (L) 04/24/2022   HCT 32.9 (L) 04/24/2022   MCV 90.6 04/24/2022   PLT 239 04/24/2022   NEUTROABS 4.4 04/24/2022        CMP  Lab Results  Component Value Date   NA 139 04/24/2022   K 3.9 04/24/2022   CL 109 04/24/2022   CO2 24 04/24/2022  GLUCOSE 94 04/24/2022   BUN 10 04/24/2022   CREATININE 0.58 04/24/2022   CALCIUM 9.2 04/24/2022   PROT 6.4 (L) 04/24/2022   ALBUMIN 3.9 04/24/2022   AST 19 04/24/2022   ALT 12 04/24/2022   ALKPHOS 57 04/24/2022   BILITOT 0.4 04/24/2022   GFRNONAA >60 04/24/2022   GFRAA 85 07/27/2020        Assessment/Plan:   Breast cancer-synchronous stage I breast cancers Mammogram/ultrasound 03/10/2022-masses at the 3:00 (1.6 m) and 11 o'clock (0.9 cm) positions of the right breast Ultrasound-guided biopsy of 3:00 and 11:00 breast masses 03/24/2022-3:00 mass revealed moderately differentiated invasive ductal adenocarcinoma, grade 2, intermediate grade DCIS without necrosis, 11:00 biopsy-invasive lobular carcinoma, grade 2 Radio guided seed lumpectomies 04/07/2022 -3:00 invasive ductal carcinoma, grade 2, 15 x 12 x 10 mm, associated DCIS with focal necrosis, negative margins, 3 mm anterior margin ER 100%, PR 1%, HER2 negative, Ki-67 5% pT1c, PN X -11:00 of lobular carcinoma, grade 2, Levan by 10 x 10 mm,  negative margins, 4 mm medial and superior margins, ER 95%, PR 20%, HER2 negative, Ki-67 2% pT1c, PN X Right breast erythema and edema 05/11/2022-prescribed course of doxycycline Pituitary adenoma 2013 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  She underwent lumpectomies for resection of breast tumors at the 3:00 and 11:00 positions of the right breast.  The tumors are hormone receptor positive and HER2 negative.  The resection margins are negative.  I discussed the prognosis and adjuvant treatment options Ms. Owens Shark.  She has a good prognosis for long-term disease-free survival with regard to the breast cancer diagnosis.  We discussed the benefit associated with adjuvant antiandrogen therapy in patients with resected hormone positive breast cancer.  I recommend adjuvant letrozole.  We reviewed potential toxicities associated with letrozole including the chance of hot flashes, arthralgias, decreased bone density, and an altered cholesterol profile.  She agrees to proceed.  She will be referred to Dr. Sondra Come to consider adjuvant radiation.  She may be a candidate to not have radiation based on her age and the tumor size, but several of the margins are close.  She appears to have colitis of the right breast.  I prescribed a course of doxycycline.  She will follow-up with Kristen [name].  Ms. Mannes has a personal and family history of colon cancer.  We will make referral to the genetics counselor. She will return for an office visit in 4 months.  We are available to see her sooner as needed. Betsy Coder, MD  05/11/2022, 4:53 PM

## 2022-05-12 ENCOUNTER — Ambulatory Visit: Payer: Self-pay | Admitting: Nurse Practitioner

## 2022-05-12 ENCOUNTER — Other Ambulatory Visit: Payer: Self-pay | Admitting: *Deleted

## 2022-05-12 NOTE — Progress Notes (Deleted)
Assessment and Plan:  Kristen [name] was seen today for follow-up.  Diagnoses and all orders for this visit:  Essential hypertension - continue medications, DASH diet, exercise and monitor at home. Call if greater than 130/80.   -     Ambulatory referral to [place] problem/Left knee pain, unspecified chronicity Will refer to home health for PT/OT evaluation to build strength She has no phone currently and her neighbor is coordinating care -     Ambulatory referral to [place] disposition pending results of labs. Discussed med's effects and SE's.   Over 30 minutes of exam, counseling, chart review, and critical decision making was performed.   Future Appointments  Date Time Provider [place]  05/12/2022 11:30 AM Darrol Jump, NP [facility] None  05/19/2022  2:30 PM [facility] NURSE [facility] None  05/19/2022  3:00 PM Gery Pray, MD [facility] None  08/04/2022  2:30 PM Unk Pinto, MD [facility] None  09/08/2022 10:40 AM Ladell Pier, MD [facility] None  02/03/2023 11:00 AM Unk Pinto, MD [facility] None  05/15/2023 11:00 AM Darrol Jump, NP [facility] None    ------------------------------------------------------------------------------------------------------------------   HPI There were no vitals taken for this visit.    81 y.o.female presents for reevaluation of left knee pain.  She continues to walk with walker and is not putting pressure on her left heel. She had been having some issues with her balance. Home PT would be beneficial . She is favoring the left leg but states pain has improved  She is on Norvasc 2.5 mg, Olmesartan 40 mg daily, Bisoprolol/HCTZ 2.5/6.25 mg QD. She doesn't check her BP at home. Denies headaches, chest pain, shortness of breath and dizziness.  BP Readings from Last 3 Encounters:  05/11/22 (!) 161/64  04/24/22 130/61  04/07/22 136/65   She does have some more stress associated with  her grandson living with her.   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  S/P CRANIOTOMY AND EYESIGHT RETURNED TO NORMAL--PT STILL HAS A LOT OF TENDERNESS RIGHT SIDE OF NOSE --THE CRANIOTOMY WAS DONE BY TRANSNASAL APPROACH   Pneumonia yrs ago   hx of pneumonia   PONV (postoperative nausea and vomiting)    PT STATES PLEASE NOTE SHE HAS TENDERNESS RIGHT SIDE OF NOSE - SINCE TRANSNASAL CRANIOTOMY 04/2012   Pre-diabetes    Prediabetes    Vitamin D deficiency      Allergies  Allergen Reactions   Aspirin Other (See Comments)    High Doses stomach trouble   Codeine Nausea And Vomiting   Fenofibrate Cough   Gabapentin Other (See Comments)    Drowsiness   Hydrochlorothiazide Other (See Comments)    Fatigue   Metoclopramide Other (See Comments)    Sedation    Current Outpatient Medications on File Prior to Visit  Medication Sig   amLODipine (NORVASC) 2.5 MG tablet Take  1 tablet  Daily for BP                                                             /  Take                                 by                                  mouth                                  once daily   atorvastatin (LIPITOR) 40 MG tablet Take  1 tablet  Daily  for Cholesterol (Patient not taking: Reported on 04/04/2022)   bisoprolol-hydrochlorothiazide (ZIAC) 2.5-6.25 MG tablet Take  1 tablet  Daily   for BP                                                                        /                                             Take                               by                        mouth                                once daily (Patient not taking: Reported on 05/11/2022)   Cholecalciferol 50 MCG (2000 UT) CHEW Chew 6,000 Units by mouth daily. Gummies   doxycycline (VIBRA-TABS) 100 MG tablet Take 1 tablet (100 mg total) by mouth 2 (two) times daily. Take x 7 days   letrozole (FEMARA) 2.5 MG tablet Take 1 tablet (2.5 mg total) by mouth daily.   meclizine (ANTIVERT) 25 MG tablet Take 1 tablet 2 to 3 x /day as needed for Vertigo                                          /                        TAKE                     BY                         MOUTH   naproxen (NAPROSYN) 375 MG tablet Take 1 tablet (375 mg total) by mouth 2 (two) times daily. Take with food (Patient not taking: Reported on 05/11/2022)   olmesartan (BENICAR) 40 MG tablet Take  1 tablet  at Night  for BP                                                            /  TAKE                                           BY                              MOUTH (Patient not taking: Reported on 05/11/2022)   ondansetron (ZOFRAN) 8 MG tablet Take 1 tablet (8 mg total) by mouth every 8 (eight) hours as needed for nausea or vomiting.   oxyCODONE (OXY IR/ROXICODONE) 5 MG immediate release tablet Take 0.5-1 tablets (2.5-5 mg total) by mouth every 6 (six) hours as needed for severe pain. (Patient not taking: Reported on 05/11/2022)   PARoxetine (PAXIL) 30 MG tablet Take  1 tablet Daily for Mood                                        /                                   TAKE                                                BY                                  MOUTH                                    ONCE DAILY (Patient not taking: Reported on 05/11/2022)   traMADol (ULTRAM) 50 MG tablet Take 1 tablet (50 mg total) by mouth every 6 (six) hours as needed. (Patient not taking: Reported on 02/21/2022)   No current facility-administered medications on file  prior to visit.    ROS: all negative except above.   Physical Exam:  There were no vitals taken for this visit.  General Appearance: Well nourished, in no apparent distress. Eyes: PERRLA, EOMs, conjunctiva no swelling or erythema Sinuses: No Frontal/maxillary tenderness ENT/Mouth: Ext aud canals clear, TMs without erythema, bulging. No erythema, swelling, or exudate on post pharynx.  Tonsils not swollen or erythematous. Hearing normal.  Neck: Supple, thyroid normal.  Respiratory: Respiratory effort normal, BS equal bilaterally without rales, rhonchi, wheezing or stridor.  Cardio: RRR with no MRGs. Brisk peripheral pulses without edema.  Abdomen: Soft, + BS.  Non tender, no guarding, rebound, hernias, masses. Lymphatics: Non tender without lymphadenopathy.  Musculoskeletal: Full ROM, 4/5 strength left leg, decreased ROM of left leg, walk antalgic gait with walker Skin: Warm, dry without rashes, lesions, ecchymosis.  Neuro: Cranial nerves intact. Normal muscle tone, no cerebellar symptoms. Sensation intact.  Psych: Awake and oriented X 3, normal affect, Insight and Judgment appropriate.     Darrol Jump, NP 10:41 AM [facility]

## 2022-05-13 NOTE — Progress Notes (Addendum)
Location of Breast Cancer:  Malignant neoplasm of upper-outer quadrant of right breast in female, estrogen receptor positive   Histology per Pathology Report:  04/07/2022 FINAL MICROSCOPIC DIAGNOSIS:  A. 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  -  Biopsy site present  -  Margins negative-3 mm anterior, 4 mm medial, 8 mm posterior, 9 mm inferior, all others greater than 10 mm.  pT1c pN n/a   Receptor Status: ER(100%), PR (1%), Her2-neu (Negative via FISH), Ki-67(5%)  Did patient present with symptoms (if so, please note symptoms) or was this found on screening mammography?: per Dr. Kalman Drape 05/11/22 note: "palpated a mass in the right breast saw her primary provider. She was referred for mammogram on 03/10/2022. A 1.2 cm spiculated mass was noted at the palpable site medial right breast. An additional irregular mass was noted in the upper outer right breast measuring 0.8 cm"  Past/Anticipated interventions by surgeon, if any:  04/07/2022 --Dr. Almond Lint Right Breast Radioactive seed localized lumpectomy x 2  Past/Anticipated interventions by medical oncology, if any:  Under care of Dr. Thornton Papas --05/11/2022 I discussed the prognosis and adjuvant treatment options Ms. Manson Passey.   She has a good prognosis for long-term disease-free survival with regard to the breast cancer diagnosis.   We discussed the benefit associated with adjuvant antiandrogen therapy in patients with resected hormone positive breast cancer.   I recommend adjuvant letrozole.   She agrees to proceed. She will be referred to Dr. Roselind Messier to consider adjuvant radiation.   She may be a candidate to not  have radiation based on her age and the tumor size, but several of the margins are close. She appears to have colitis of the right breast.  I prescribed a course of doxycycline.   She will follow-up with Dr. Donell Beers. Ms. Leeming has a personal and family history of colon cancer.   We will make referral to the genetics counselor. She will return for an office visit in 4 months.   We are available to see her sooner as needed.  Lymphedema issues, if any:  Denies; has not had any PT evaluation     Pain issues, if any:  Reports discomfort along lumpectomy incision. Also sustained a fall on her left side back in September; reports on-going left shoulder/clavicle pain since  SAFETY ISSUES: Prior radiation? No Pacemaker/ICD? No Possible current pregnancy? No--postmenopausal  Is the patient on methotrexate? No  Current Complaints / other details:  Balance issues and heel pain related to poor circulation/cold extremities.

## 2022-05-17 ENCOUNTER — Inpatient Hospital Stay: Payer: PPO | Admitting: Licensed Clinical Social Worker

## 2022-05-17 NOTE — Progress Notes (Signed)
[place] Work  Clinical Social Work was referred by Art therapist for assessment of psychosocial needs.  Clinical Social Worker contacted caregiver by phone to offer support and assess for needs.   CSW spoke with patient's son, [name].  He stated his son lives with patient.  She has all of her needs met per [name].  He stated patient's cognitive abilities have improved this week.  He plans on communicating with his son in determining if there are any changes with patient, and will contact CSW if needed.  Patient currently only wants to leave her house for MD visits.  CSW provided contact information.     Margaree Mackintosh, LCSW  Clinical Social Worker [facility]

## 2022-05-18 NOTE — Progress Notes (Signed)
Radiation Oncology         ([phone]) [phone] ________________________________  Initial Outpatient Consultation  Name: Kristen [name] MRN: [id number]  Date: 05/19/2022  DOB: 09/23/1940  CC:Kristen Cowboy, MD  Ladene Artist, MD   REFERRING PHYSICIAN: Ladene Artist, MD  DIAGNOSIS: The encounter diagnosis was Malignant neoplasm of upper-outer quadrant of right breast in female, estrogen receptor positive (HCC).  Stage IA (cT1c, cN0, cM0) Right breast cancer  -- 3 o'clock Right Breast, Invasive and in-situ ductal carcinoma, ER+ / PR+ / Her2-, Grade 2 -- 11 o'clock Right Breast (UOQ), Invasive Lobular Carcinoma, ER+ / PR+ / Her2-, Grade 2   Cancer Staging  Breast cancer (HCC) Staging form: Breast, AJCC 8th Edition - Clinical: Stage IA (cT1c, cN0, cM0, G2, ER+, PR+, HER2-) - Signed by Ladene Artist, MD on 05/11/2022  History of colon cancer in the 90's and in 2014 (both resected, and she did not have any chemotherapy or radiation). She also has a history of pituitary tumor, s/p craniotomy in 2013  HISTORY OF PRESENT ILLNESS::Kristen [name] is a 81 y.o. female who is accompanied by her son. she is seen as a courtesy of Dr. Myrle Sheng for an opinion concerning radiation therapy as part of management for her recently diagnosed right breast cancer.   The patient presented with a palpable right breast lump present for several months this past October. This prompted a bilateral diagnostic mammogram and right breast ultrasound on 03/10/22 which showed a highly suspicious mass in the 3 o'clock right breast, measuring 1.6 cm, and a second suspicious mass in the right breast in the 11 o'clock position measuring 0.9 cm. No evidence of malignancy was seen in the left breast, and no abnormal right axillary lymph nodes were appreciated.   Biopsy of the 3 o'clock right breast mass on 03/24/22 revealed grade 2 invasive ductal adenocarcinoma measuring 10 mm in the greatest linear extent of the  sample, with intermediate grade DCIS containing microcalcifications and peritumoral stroma (negative for necrosis or LVI). Prognostic indicators significant for: estrogen receptor 100% positive and progesterone receptor 1% positive, both with strong staining intensity; Proliferation marker Ki67 at 5%; Her2 status negative; Grade 2.   Biopsy of the 11 o'clock right breast mass also performed on 03/24/22 revealed grade 2 invasive lobular carcinoma measuring 7 mm in the greatest linear extent of the sample (negative for LVI). Prognostic indicators significant for: estrogen receptor 95% positive and progesterone receptor 20% positive, both with strong staining intensity; Proliferation marker Ki67 at 2%; Her2 status negative; Grade 2.   She was accordingly referred to Dr. Donell Beers on 04/01/22 to discuss surgical treatment options. Given the small size of both of her tumors, her age, and favorable prognostic profile, Dr. Donell Beers advised proceeding with lumpectomies for both tumors without sentinel lymph node biopsies.    The patient opted to proceed with breast conserving surgeries (without nodal biopsies) on 04/07/22 under the care of Dr. Donell Beers.  -- Pathology from lumpectomy of the 3 o'clock mass revealed: grade 2 invasive ductal carcinoma measuring 15 mm in the greatest extent of the tumor; and intermediate grade DCIS with focal necrosis. All margins negative for both invasive and in-situ disease. Prognostic indicators significant for: estrogen receptor 100% positive and progesterone receptor 1% positive, both with strong staining intensity; Proliferation marker Ki67 at 5%; Her2 status negative; Grade 2.    -- Pathology from lumpectomy of the 11 o'clock mass revealed: grade 2 invasive lobular carcinoma measuring 11 mm in the greatest extent  of the tumor. All margins negative for invasive disease. Prognostic indicators significant for: estrogen receptor 95% positive and progesterone receptor 20% positive, both with  strong staining intensity; Proliferation marker Ki67 at 2%; Her2 status negative; Grade 2.   Accordingly, the patient was referred to Dr. Truett Perna on 05/11/22 to discuss treatment options. Physical exam performed during this visit noted diffuse erythema, edema, and warmth of the right breast, and she was prescribed a course of doxycycline to suspected infection. In terms of further treatment, Dr. Truett Perna recommends adjuvant antiestrogen therapy consisting of letrozole following XRT which the patient is agreeable to proceed with.   Additionally, Dr. Myrle Sheng has referred her to genetics given her personal and family history of colon cancer.   Of note: The patient presented to the ED on 04/24/22 with neck pain, left shoulder pain, and shaking chills. Given that work-up including labs and a chest x-ray were unremarkable, and that her left shoulder pain was reproducible with palpation, it was highly suspected that her pain was musculoskeletal in etiology (likely a muscle strain).  She was given toradol for pain management and instructed to follow-up with her PCP at discharge.   The patient also has a history recurrent falls which prompted several ED visits this past September and October. This coincides with a decline in her mental status over the past year which she is aware of. Of note, she lives with her 61 year old grandson. CT of the head performed on 03/28/22 in the ED (to rule out trauma after a fall episode) showed no acute intracranial abnormalities. Imaging however showed a heterogeneous sellar/suprasellar mass measuring 2.5 cm; presumed to correlate with a reported history of a previously debulked pituitary macroadenoma. Although no prior imaging is available for direct comparison, the mass appeared to be about 0.8 cm larger when compared to a brain MRI from 08/19/2014 (where it was reported at 1.7 cm).     Patient is present today with her son who brings her to most of her appointments. Today she  reports to be having continued breast pain that has been present since her surgery. She has also been experiencing redness of her right breast. Both the pain and redness have gone down since starting an antibiotic last week. She has one more day of doxycyline. She denies any nipple discharge or bleeding.   She also complains of continued neck pain since her fall that radiates to her left shoulder. She has not tried taking anything for this pain. She still has complete range of motion in her left arm.   PREVIOUS RADIATION THERAPY: No  PAST MEDICAL HISTORY:  Past Medical History:  Diagnosis Date   Arthritis    Breast cancer, right (HCC)    Colon cancer (HCC) 1990s, 2014   Colon polyps    Depression    Dysrhythmia 04/06/2012   GERD (gastroesophageal reflux disease)    Headache(784.0)    hx migraines   Hypertension 1999   Pituitary mass (HCC) 04/06/2012   MASS FOUND AFTER PT EXPERIENCED BILATERAL BLURRED / HAZY VISION -ESP LEFT EYE.  S/P CRANIOTOMY AND EYESIGHT RETURNED TO NORMAL--PT STILL HAS A LOT OF TENDERNESS RIGHT SIDE OF NOSE --THE CRANIOTOMY WAS DONE BY TRANSNASAL APPROACH   Pneumonia yrs ago   hx of pneumonia   PONV (postoperative nausea and vomiting)    PT STATES PLEASE NOTE SHE HAS TENDERNESS RIGHT SIDE OF NOSE - SINCE TRANSNASAL CRANIOTOMY 04/2012   Pre-diabetes    Prediabetes    Vitamin D deficiency  PAST SURGICAL HISTORY: Past Surgical History:  Procedure Laterality Date   BACK SURGERY     lower   BREAST LUMPECTOMY WITH RADIOACTIVE SEED LOCALIZATION Right 04/07/2022   Procedure: RIGHT BREAST LUMPECTOMY WITH RADIOACTIVE SEED LOCALIZATION X2;  Surgeon: Almond Lint, MD;  Location: [facility] OR;  Service: General;  Laterality: Right;   CATARACT EXTRACTION  June/july 2013   bilateral eyes   COLON RESECTION  1988   COLON SURGERY  1990's   COLONOSCOPY  09/20/2011   Procedure: COLONOSCOPY;  Surgeon: Barrie Folk, MD;  Location: [facility] END[facility];  Service: Endoscopy;  Laterality:  N/A;   COLONOSCOPY  12/15/2011   Procedure: COLONOSCOPY;  Surgeon: Barrie Folk, MD;  Location: [facility] ENDOSCOPY;  Service: Endoscopy;  Laterality: N/A;   COLONOSCOPY  01/26/2012   Procedure: COLONOSCOPY;  Surgeon: Florencia Reasons, MD;  Location: [facility] ENDOSCOPY;  Service: Endoscopy;  Laterality: N/A;   CRANIOTOMY  04/18/2012   Procedure: CRANIOTOMY HYPOPHYSECTOMY TRANSNASAL APPROACH;  Surgeon: Cristi Loron, MD;  Location: [facility]URO [facility];  Service: Neurosurgery;  Laterality: Bilateral;  Transphenoidal resection of Pituitary tumor.    CYSTOSCOPY WITH URETEROSCOPY AND STENT PLACEMENT Bilateral 10/01/2015   Procedure: CYSTOSCOPY, retrograde WITH URETEROSCOPY AND STENT PLACEMENT, ;  Surgeon: Heloise Purpura, MD;  Location: [facility]S;  Service: Urology;  Laterality: Bilateral;   CYSTOSCOPY WITH URETEROSCOPY AND STENT PLACEMENT Right 10/19/2015   Procedure: CYSTOSCOPY,  RIGHT URETEROSCOPY, HOLMIUM LASER RIGHT URETERAL STONE, RIGHT URETERAL STENT PLACEMENT, LEFT URETEROSCOPY, INSERTION LEFT URETERAL STENT;  Surgeon: Heloise Purpura, MD;  Location: [facility] ORS;  Service: Urology;  Laterality: Right;   EYE SURGERY     cataract bil   HOLMIUM LASER APPLICATION Right 10/19/2015   Procedure: HOLMIUM LASER  ;  Surgeon: Heloise Purpura, MD;  Location: [facility];  Service: Urology;  Laterality: Right;   HOT HEMOSTASIS  09/20/2011   Procedure: HOT HEMOSTASIS (ARGON PLASMA COAGULATION/BICAP);  Surgeon: Barrie Folk, MD;  Location: [facility];  Service: Endoscopy;  Laterality: N/A;   LAPAROSCOPIC RIGHT HEMI COLECTOMY  06/27/2012   Procedure: LAPAROSCOPIC RIGHT HEMI COLECTOMY;  Surgeon: Valarie Merino, MD;  Location: [facility] ORS;  Service: General;  Laterality: N/A;  Laparoscopic Assited Right Hemicolectomy   TRANSNASAL APPROACH  04/18/2012   Procedure: TRANSNASAL APPROACH;  Surgeon: Drema Halon, MD;  Location: [facility];  Service: ENT;  Laterality: Bilateral;  Transnasal approach   TUMOR REMOVAL     Left leg    FAMILY  HISTORY:  Family History  Problem Relation Age of Onset   Heart disease Mother    Heart disease Father    Colon cancer Sister 25   Breast cancer Sister        in 77's   Dementia Sister    Cancer Maternal Aunt        unknown form of cancer dx in her 80s-50s   Liver cancer Maternal Uncle        heavy ETOH user   Colon cancer Cousin    Brain cancer Cousin        dx in his 59s; paternal cousin   Colon polyps Son    Colon polyps Son    Colon cancer Other 30   Colon polyps Other     SOCIAL HISTORY:  Social History   Tobacco Use   Smoking status: Never   Smokeless tobacco: Never  Vaping Use   Vaping Use: Never used  Substance Use Topics   Alcohol use: Yes    Comment: a  couple times a year, one drink at a time   Drug use: No    ALLERGIES:  Allergies  Allergen Reactions   Aspirin Other (See Comments)    High Doses stomach trouble   Codeine Nausea And Vomiting   Fenofibrate Cough   Gabapentin Other (See Comments)    Drowsiness   Hydrochlorothiazide Other (See Comments)    Fatigue   Metoclopramide Other (See Comments)    Sedation    MEDICATIONS:  Current Outpatient Medications  Medication Sig Dispense Refill   doxycycline (VIBRA-TABS) 100 MG tablet Take 1 tablet (100 mg total) by mouth 2 (two) times daily. 14 tablet 0   amLODipine (NORVASC) 2.5 MG tablet Take  1 tablet  Daily for BP                                                             /                                                           Take                                 by                                  mouth                                  once daily 90 tablet 3   atorvastatin (LIPITOR) 40 MG tablet Take  1 tablet  Daily  for Cholesterol (Patient not taking: Reported on 04/04/2022) 90 tablet 3   bisoprolol-hydrochlorothiazide (ZIAC) 2.5-6.25 MG tablet Take  1 tablet  Daily   for BP                                                                        /                                              Take                              by                        mouth                                once daily (Patient not  taking: Reported on 05/11/2022) 90 tablet 3   Cholecalciferol 50 MCG (2000 UT) CHEW Chew 6,000 Units by mouth daily. Gummies     doxycycline (VIBRA-TABS) 100 MG tablet Take 1 tablet (100 mg total) by mouth 2 (two) times daily. Take x 7 days 14 tablet 0   letrozole (FEMARA) 2.5 MG tablet Take 1 tablet (2.5 mg total) by mouth daily. 90 tablet 1   meclizine (ANTIVERT) 25 MG tablet Take 1 tablet 2 to 3 x /day as needed for Vertigo                                          /                        TAKE                     BY                         MOUTH 270 tablet 3   naproxen (NAPROSYN) 375 MG tablet Take 1 tablet (375 mg total) by mouth 2 (two) times daily. Take with food (Patient not taking: Reported on 05/11/2022) 20 tablet 0   olmesartan (BENICAR) 40 MG tablet Take  1 tablet  at Night  for BP                                                            /                                                     TAKE                                           BY                              MOUTH (Patient not taking: Reported on 05/11/2022) 90 tablet 3   ondansetron (ZOFRAN) 8 MG tablet Take 1 tablet (8 mg total) by mouth every 8 (eight) hours as needed for nausea or vomiting. 20 tablet 0   oxyCODONE (OXY IR/ROXICODONE) 5 MG immediate release tablet Take 0.5-1 tablets (2.5-5 mg total) by mouth every 6 (six) hours as needed for severe pain. (Patient not taking: Reported on 05/11/2022) 5 tablet 0   PARoxetine (PAXIL) 30 MG tablet Take  1 tablet Daily for Mood                                        /                                   TAKE  BY                                  MOUTH                                    ONCE DAILY (Patient not taking: Reported on 05/11/2022) 90 tablet 3   traMADol (ULTRAM) 50 MG tablet Take 1 tablet (50 mg total) by mouth every 6  (six) hours as needed. (Patient not taking: Reported on 02/21/2022) 12 tablet 0   No current facility-administered medications for this encounter.    REVIEW OF SYSTEMS:  A 10+ POINT REVIEW OF SYSTEMS WAS OBTAINED including neurology, dermatology, psychiatry, cardiac, respiratory, lymph, extremities, GI, GU, musculoskeletal, constitutional, reproductive, HEENT. Positive for breast pain and swelling. Negative for nipple discharge or bleeding.    PHYSICAL EXAM:  height is 5\' 5"  (1.651 m) and weight is 138 lb 9.6 oz (62.9 kg). Her temperature is 97.2 F (36.2 C) (abnormal). Her blood pressure is 173/76 (abnormal) and her pulse is 55 (abnormal). Her respiration is 20 and oxygen saturation is 100%.   General: Alert and oriented, in no acute distress HEENT: Head is normocephalic. Extraocular movements are intact.  Neck: Neck is supple, no palpable cervical or supraclavicular lymphadenopathy. Heart: Regular in rate and rhythm with no murmurs, rubs, or gallops. Chest: Clear to auscultation bilaterally, with no rhonchi, wheezes, or rales. Abdomen: Soft, nontender, nondistended, with no rigidity or guarding. Extremities: No cyanosis or edema. Lymphatics: see Neck Exam Skin: No concerning lesions. Musculoskeletal: symmetric strength and muscle tone throughout. Pain to palpation on the upper left back. Muscle tightness felt with palpation. Neurologic: Cranial nerves II through XII are grossly intact. No obvious focalities. Speech is fluent. Coordination is intact. Psychiatric: Judgment and insight are intact. Affect is appropriate.  Left Breast: no palpable mass, nipple discharge or bleeding. Right breast:  Skin intact. Erythematous skin surrounding the right nipple. Well healing surgical incision around the right nipple.Warm to touch. No discharge or bleeding.  2 separate lumpectomy scars noted.  ECOG = 2  0 - Asymptomatic (Fully active, able to carry on all predisease activities without  restriction)  1 - Symptomatic but completely ambulatory (Restricted in physically strenuous activity but ambulatory and able to carry out work of a light or sedentary nature. For example, light housework, office work)  2 - Symptomatic, <50% in bed during the day (Ambulatory and capable of all self care but unable to carry out any work activities. Up and about more than 50% of waking hours)  3 - Symptomatic, >50% in bed, but not bedbound (Capable of only limited self-care, confined to bed or chair 50% or more of waking hours)  4 - Bedbound (Completely disabled. Cannot carry on any self-care. Totally confined to bed or chair)  5 - Death   Santiago Glad MM, Creech RH, Tormey DC, et al. [phone]). "Toxicity and response criteria of the [employer]". Am. Evlyn Clines. Oncol. 5 (6): 649-55  LABORATORY DATA:  Lab Results  Component Value Date   WBC 6.6 04/24/2022   HGB 10.9 (L) 04/24/2022   HCT 32.9 (L) 04/24/2022   MCV 90.6 04/24/2022   PLT 239 04/24/2022   NEUTROABS 4.4 04/24/2022   Lab Results  Component Value Date   NA 139 04/24/2022   K 3.9 04/24/2022   CL 109 04/24/2022  CO2 24 04/24/2022   GLUCOSE 94 04/24/2022   BUN 10 04/24/2022   CREATININE 0.58 04/24/2022   CALCIUM 9.2 04/24/2022      RADIOGRAPHY: DG Chest 2 View  Result Date: 04/24/2022 CLINICAL DATA:  Neck pain EXAM: CHEST - 2 VIEW COMPARISON:  Chest x-ray 10/16/2015 FINDINGS: The heart size and mediastinal contours are within normal limits. Both lungs are clear. The visualized skeletal structures are unremarkable. There surgical clips in the right anterior chest wall. IMPRESSION: No active cardiopulmonary disease. Electronically Signed   By: Darliss Cheney M.D.   On: 04/24/2022 19:49      IMPRESSION: Stage IA (cT1c, cN0, cM0) Right breast cancer  -- 3 o'clock Right Breast, Invasive and in-situ ductal carcinoma, ER+ / PR+ / Her2-, Grade 2 -- 11 o'clock Right Breast (UOQ), Invasive Lobular Carcinoma, ER+ / PR+ /  Her2-, Grade 2  Today, I talked to the patient and family about the findings and work-up thus far.  We discussed the natural history of stage IA breast cancer and general treatment, highlighting the role of radiotherapy in the management. Given the close margins from surgery, patient is a good candidate for 4 weeks of radiation therapy to the right breast. We discussed the available radiation techniques, and focused on the details of logistics and delivery.  We reviewed the anticipated acute and late sequelae associated with radiation in this setting.  The patient was encouraged to ask questions that I answered to the best of my ability. A patient consent form was discussed and signed.  We retained a copy for our records.  The patient would like to proceed with radiation and will be scheduled for CT simulation on Monday.  Patient's mastitis is improving, but not completely healed. She would benefit from another 7 day course of doxycyline.   Shoulder pain is likely musculoskeletal in etiology given it presented after her fall. She was worked up in the ED on 04/24/22 where she had an unremarkable CXR. Pain is reproducible with palpation.   PLAN: Prescribed 7 day course of doxycyline to help clear up infection. Will schedule CT simulation for next Monday and start 4 weeks of radiation therapy to the right breast approximately one week later.   Patient should follow up with PCP to further evaluate on-going shoulder pain.   60 minutes of total time was spent for this patient encounter, including preparation, face-to-face counseling with the patient and coordination of care, physical exam, and documentation of the encounter.   ------------------------------------------------   Joyice Faster, PA   Billie Lade, PhD, MD  This document serves as a record of services personally performed by Antony Blackbird, MD. It was created on his behalf by Neena Rhymes, a trained medical scribe. The creation of this  record is based on the scribe's personal observations and the provider's statements to them. This document has been checked and approved by the attending provider.

## 2022-05-19 ENCOUNTER — Encounter: Payer: Self-pay | Admitting: Radiation Oncology

## 2022-05-19 ENCOUNTER — Other Ambulatory Visit: Payer: Self-pay

## 2022-05-19 ENCOUNTER — Encounter: Payer: Self-pay | Admitting: Internal Medicine

## 2022-05-19 ENCOUNTER — Ambulatory Visit
Admission: RE | Admit: 2022-05-19 | Discharge: 2022-05-19 | Disposition: A | Discharge status: Home / Self Care | Payer: PPO | Source: Ambulatory Visit | Attending: Radiation Oncology | Admitting: Radiation Oncology

## 2022-05-19 VITALS — BP 173/76 | HR 55 | Temp 97.2°F | Resp 20 | Ht 65.0 in | Wt 138.6 lb

## 2022-05-19 DIAGNOSIS — Z170 Estrogen receptor positive status [ER+]: Secondary | ICD-10-CM

## 2022-05-19 DIAGNOSIS — C50411 Malignant neoplasm of upper-outer quadrant of right female breast: Secondary | ICD-10-CM | POA: Diagnosis not present

## 2022-05-19 MED ORDER — DOXYCYCLINE HYCLATE 100 MG PO TABS: 100.0000 mg | ORAL_TABLET | Freq: Two times a day (BID) | ORAL | 0 refills | Status: DC

## 2022-05-20 ENCOUNTER — Encounter: Payer: Self-pay | Admitting: *Deleted

## 2022-05-23 ENCOUNTER — Other Ambulatory Visit: Payer: Self-pay

## 2022-05-23 ENCOUNTER — Ambulatory Visit
Admission: RE | Admit: 2022-05-23 | Discharge: 2022-05-23 | Disposition: A | Discharge status: Home / Self Care | Payer: PPO | Source: Ambulatory Visit | Attending: Radiation Oncology | Admitting: Radiation Oncology

## 2022-05-23 DIAGNOSIS — Z510 Encounter for antineoplastic radiation therapy: Secondary | ICD-10-CM | POA: Diagnosis not present

## 2022-05-23 DIAGNOSIS — C50811 Malignant neoplasm of overlapping sites of right female breast: Secondary | ICD-10-CM

## 2022-05-23 DIAGNOSIS — Z170 Estrogen receptor positive status [ER+]: Secondary | ICD-10-CM | POA: Diagnosis not present

## 2022-05-23 DIAGNOSIS — C50411 Malignant neoplasm of upper-outer quadrant of right female breast: Secondary | ICD-10-CM | POA: Diagnosis not present

## 2022-05-26 ENCOUNTER — Ambulatory Visit: Payer: Self-pay | Admitting: Nurse Practitioner

## 2022-05-26 DIAGNOSIS — Z170 Estrogen receptor positive status [ER+]: Secondary | ICD-10-CM | POA: Diagnosis not present

## 2022-05-26 DIAGNOSIS — Z510 Encounter for antineoplastic radiation therapy: Secondary | ICD-10-CM | POA: Diagnosis not present

## 2022-05-26 DIAGNOSIS — C50411 Malignant neoplasm of upper-outer quadrant of right female breast: Secondary | ICD-10-CM | POA: Diagnosis not present

## 2022-05-31 ENCOUNTER — Other Ambulatory Visit: Payer: Self-pay

## 2022-05-31 ENCOUNTER — Ambulatory Visit
Admission: RE | Admit: 2022-05-31 | Discharge: 2022-05-31 | Disposition: A | Discharge status: Home / Self Care | Payer: PPO | Source: Ambulatory Visit | Attending: Radiation Oncology | Admitting: Radiation Oncology

## 2022-05-31 DIAGNOSIS — C50411 Malignant neoplasm of upper-outer quadrant of right female breast: Secondary | ICD-10-CM | POA: Diagnosis not present

## 2022-05-31 DIAGNOSIS — Z510 Encounter for antineoplastic radiation therapy: Secondary | ICD-10-CM | POA: Diagnosis not present

## 2022-05-31 DIAGNOSIS — Z170 Estrogen receptor positive status [ER+]: Secondary | ICD-10-CM | POA: Diagnosis not present

## 2022-05-31 DIAGNOSIS — C50811 Malignant neoplasm of overlapping sites of right female breast: Secondary | ICD-10-CM

## 2022-05-31 LAB — RAD ONC ARIA SESSION SUMMARY
Course Elapsed Days: 0
Plan Fractions Treated to Date: 1
Plan Prescribed Dose Per Fraction: 2.67 Gy
Plan Total Fractions Prescribed: 15
Plan Total Prescribed Dose: 40.05 Gy
Reference Point Dosage Given to Date: 2.67 Gy
Reference Point Session Dosage Given: 2.67 Gy
Session Number: 1

## 2022-05-31 MED ORDER — ALRA NON-METALLIC DEODORANT (RAD-ONC)
1.0000 | Freq: Once | TOPICAL | Status: AC
  Administered 2022-05-31: 1 via TOPICAL

## 2022-05-31 MED ORDER — RADIAPLEXRX EX GEL: Freq: Once | CUTANEOUS | Status: AC

## 2022-05-31 NOTE — Progress Notes (Signed)
Pt here for patient teaching. Pt given Radiation and You booklet, skin care instructions, Alra deodorant, and Radiaplex gel. Reviewed areas of pertinence such as fatigue, hair loss, nausea and vomiting, skin changes, breast tenderness, and breast swelling. Pt able to give teach back of to pat skin, use unscented/gentle soap, and drink plenty of water, apply Radiaplex bid, avoid applying anything to skin within 4 hours of treatment, avoid wearing an under wire bra, and to use an electric razor if they must shave. Pt verbalizes understanding of information given and will contact nursing with any questions or concerns.     [email or web address].org/treatmentinformation/whattoexpect/index      

## 2022-06-01 ENCOUNTER — Ambulatory Visit
Admission: RE | Admit: 2022-06-01 | Discharge: 2022-06-01 | Disposition: A | Discharge status: Home / Self Care | Payer: PPO | Source: Ambulatory Visit | Attending: Radiation Oncology | Admitting: Radiation Oncology

## 2022-06-01 ENCOUNTER — Other Ambulatory Visit: Payer: Self-pay

## 2022-06-01 DIAGNOSIS — C50411 Malignant neoplasm of upper-outer quadrant of right female breast: Secondary | ICD-10-CM | POA: Diagnosis not present

## 2022-06-01 DIAGNOSIS — Z170 Estrogen receptor positive status [ER+]: Secondary | ICD-10-CM | POA: Diagnosis not present

## 2022-06-01 DIAGNOSIS — Z510 Encounter for antineoplastic radiation therapy: Secondary | ICD-10-CM | POA: Diagnosis not present

## 2022-06-01 LAB — RAD ONC ARIA SESSION SUMMARY
Course Elapsed Days: 1
Plan Fractions Treated to Date: 2
Plan Prescribed Dose Per Fraction: 2.67 Gy
Plan Total Fractions Prescribed: 15
Plan Total Prescribed Dose: 40.05 Gy
Reference Point Dosage Given to Date: 5.34 Gy
Reference Point Session Dosage Given: 2.67 Gy
Session Number: 2

## 2022-06-02 ENCOUNTER — Other Ambulatory Visit: Payer: Self-pay

## 2022-06-02 ENCOUNTER — Telehealth: Payer: Self-pay | Admitting: *Deleted

## 2022-06-02 ENCOUNTER — Ambulatory Visit
Admission: RE | Admit: 2022-06-02 | Discharge: 2022-06-02 | Disposition: A | Discharge status: Home / Self Care | Payer: PPO | Source: Ambulatory Visit | Attending: Radiation Oncology | Admitting: Radiation Oncology

## 2022-06-02 DIAGNOSIS — Z510 Encounter for antineoplastic radiation therapy: Secondary | ICD-10-CM | POA: Diagnosis not present

## 2022-06-02 DIAGNOSIS — C50411 Malignant neoplasm of upper-outer quadrant of right female breast: Secondary | ICD-10-CM | POA: Diagnosis not present

## 2022-06-02 DIAGNOSIS — Z170 Estrogen receptor positive status [ER+]: Secondary | ICD-10-CM | POA: Diagnosis not present

## 2022-06-02 LAB — RAD ONC ARIA SESSION SUMMARY
Course Elapsed Days: 2
Plan Fractions Treated to Date: 3
Plan Prescribed Dose Per Fraction: 2.67 Gy
Plan Total Fractions Prescribed: 15
Plan Total Prescribed Dose: 40.05 Gy
Reference Point Dosage Given to Date: 8.01 Gy
Reference Point Session Dosage Given: 2.67 Gy
Session Number: 3

## 2022-06-02 NOTE — Telephone Encounter (Signed)
CALLED PATIENT TO ASK IF SHE WOULD BE WILLING TO SEE A NUTRITIONIST ON 06-17-22 @ 12 PM, SPOKE WITH PATIENT AND SHE AGREED TO DO SO

## 2022-06-03 ENCOUNTER — Other Ambulatory Visit: Payer: Self-pay

## 2022-06-03 ENCOUNTER — Ambulatory Visit
Admission: RE | Admit: 2022-06-03 | Discharge: 2022-06-03 | Disposition: A | Discharge status: Home / Self Care | Payer: PPO | Source: Ambulatory Visit | Attending: Radiation Oncology | Admitting: Radiation Oncology

## 2022-06-03 DIAGNOSIS — Z170 Estrogen receptor positive status [ER+]: Secondary | ICD-10-CM | POA: Diagnosis not present

## 2022-06-03 DIAGNOSIS — C50411 Malignant neoplasm of upper-outer quadrant of right female breast: Secondary | ICD-10-CM | POA: Diagnosis not present

## 2022-06-03 DIAGNOSIS — Z510 Encounter for antineoplastic radiation therapy: Secondary | ICD-10-CM | POA: Diagnosis not present

## 2022-06-03 LAB — RAD ONC ARIA SESSION SUMMARY
Course Elapsed Days: 3
Plan Fractions Treated to Date: 4
Plan Prescribed Dose Per Fraction: 2.67 Gy
Plan Total Fractions Prescribed: 15
Plan Total Prescribed Dose: 40.05 Gy
Reference Point Dosage Given to Date: 10.68 Gy
Reference Point Session Dosage Given: 2.67 Gy
Session Number: 4

## 2022-06-07 ENCOUNTER — Ambulatory Visit
Admission: RE | Admit: 2022-06-07 | Discharge: 2022-06-07 | Disposition: A | Discharge status: Home / Self Care | Payer: PPO | Source: Ambulatory Visit | Attending: Radiation Oncology | Admitting: Radiation Oncology

## 2022-06-07 ENCOUNTER — Other Ambulatory Visit: Payer: Self-pay

## 2022-06-07 DIAGNOSIS — Z170 Estrogen receptor positive status [ER+]: Secondary | ICD-10-CM | POA: Insufficient documentation

## 2022-06-07 DIAGNOSIS — C50811 Malignant neoplasm of overlapping sites of right female breast: Secondary | ICD-10-CM | POA: Diagnosis not present

## 2022-06-07 DIAGNOSIS — C50411 Malignant neoplasm of upper-outer quadrant of right female breast: Secondary | ICD-10-CM | POA: Diagnosis not present

## 2022-06-07 DIAGNOSIS — Z510 Encounter for antineoplastic radiation therapy: Secondary | ICD-10-CM | POA: Diagnosis not present

## 2022-06-07 LAB — RAD ONC ARIA SESSION SUMMARY
Course Elapsed Days: 7
Plan Fractions Treated to Date: 5
Plan Prescribed Dose Per Fraction: 2.67 Gy
Plan Total Fractions Prescribed: 15
Plan Total Prescribed Dose: 40.05 Gy
Reference Point Dosage Given to Date: 13.35 Gy
Reference Point Session Dosage Given: 2.67 Gy
Session Number: 5

## 2022-06-08 ENCOUNTER — Other Ambulatory Visit: Payer: Self-pay

## 2022-06-08 ENCOUNTER — Ambulatory Visit
Admission: RE | Admit: 2022-06-08 | Discharge: 2022-06-08 | Disposition: A | Discharge status: Home / Self Care | Payer: PPO | Source: Ambulatory Visit | Attending: Radiation Oncology | Admitting: Radiation Oncology

## 2022-06-08 DIAGNOSIS — C50411 Malignant neoplasm of upper-outer quadrant of right female breast: Secondary | ICD-10-CM | POA: Diagnosis not present

## 2022-06-08 DIAGNOSIS — Z510 Encounter for antineoplastic radiation therapy: Secondary | ICD-10-CM | POA: Diagnosis not present

## 2022-06-08 DIAGNOSIS — Z170 Estrogen receptor positive status [ER+]: Secondary | ICD-10-CM | POA: Diagnosis not present

## 2022-06-08 DIAGNOSIS — C50811 Malignant neoplasm of overlapping sites of right female breast: Secondary | ICD-10-CM | POA: Diagnosis not present

## 2022-06-08 LAB — RAD ONC ARIA SESSION SUMMARY
Course Elapsed Days: 8
Plan Fractions Treated to Date: 6
Plan Prescribed Dose Per Fraction: 2.67 Gy
Plan Total Fractions Prescribed: 15
Plan Total Prescribed Dose: 40.05 Gy
Reference Point Dosage Given to Date: 16.02 Gy
Reference Point Session Dosage Given: 2.67 Gy
Session Number: 6

## 2022-06-09 ENCOUNTER — Other Ambulatory Visit: Payer: Self-pay

## 2022-06-09 ENCOUNTER — Ambulatory Visit
Admission: RE | Admit: 2022-06-09 | Discharge: 2022-06-09 | Disposition: A | Discharge status: Home / Self Care | Payer: PPO | Source: Ambulatory Visit | Attending: Radiation Oncology | Admitting: Radiation Oncology

## 2022-06-09 DIAGNOSIS — Z170 Estrogen receptor positive status [ER+]: Secondary | ICD-10-CM | POA: Diagnosis not present

## 2022-06-09 DIAGNOSIS — Z510 Encounter for antineoplastic radiation therapy: Secondary | ICD-10-CM | POA: Diagnosis not present

## 2022-06-09 DIAGNOSIS — C50411 Malignant neoplasm of upper-outer quadrant of right female breast: Secondary | ICD-10-CM | POA: Diagnosis not present

## 2022-06-09 DIAGNOSIS — C50811 Malignant neoplasm of overlapping sites of right female breast: Secondary | ICD-10-CM | POA: Diagnosis not present

## 2022-06-09 LAB — RAD ONC ARIA SESSION SUMMARY
Course Elapsed Days: 9
Plan Fractions Treated to Date: 7
Plan Prescribed Dose Per Fraction: 2.67 Gy
Plan Total Fractions Prescribed: 15
Plan Total Prescribed Dose: 40.05 Gy
Reference Point Dosage Given to Date: 18.69 Gy
Reference Point Session Dosage Given: 2.67 Gy
Session Number: 7

## 2022-06-10 ENCOUNTER — Ambulatory Visit
Admission: RE | Admit: 2022-06-10 | Discharge: 2022-06-10 | Disposition: A | Discharge status: Home / Self Care | Payer: PPO | Source: Ambulatory Visit | Attending: Radiation Oncology | Admitting: Radiation Oncology

## 2022-06-10 ENCOUNTER — Other Ambulatory Visit: Payer: Self-pay

## 2022-06-10 DIAGNOSIS — Z170 Estrogen receptor positive status [ER+]: Secondary | ICD-10-CM | POA: Diagnosis not present

## 2022-06-10 DIAGNOSIS — C50411 Malignant neoplasm of upper-outer quadrant of right female breast: Secondary | ICD-10-CM | POA: Diagnosis not present

## 2022-06-10 DIAGNOSIS — Z510 Encounter for antineoplastic radiation therapy: Secondary | ICD-10-CM | POA: Diagnosis not present

## 2022-06-10 DIAGNOSIS — C50811 Malignant neoplasm of overlapping sites of right female breast: Secondary | ICD-10-CM | POA: Diagnosis not present

## 2022-06-10 LAB — RAD ONC ARIA SESSION SUMMARY
Course Elapsed Days: 10
Plan Fractions Treated to Date: 8
Plan Prescribed Dose Per Fraction: 2.67 Gy
Plan Total Fractions Prescribed: 15
Plan Total Prescribed Dose: 40.05 Gy
Reference Point Dosage Given to Date: 21.36 Gy
Reference Point Session Dosage Given: 2.67 Gy
Session Number: 8

## 2022-06-13 ENCOUNTER — Other Ambulatory Visit: Payer: Self-pay

## 2022-06-13 ENCOUNTER — Ambulatory Visit
Admission: RE | Admit: 2022-06-13 | Discharge: 2022-06-13 | Disposition: A | Discharge status: Home / Self Care | Payer: PPO | Source: Ambulatory Visit | Attending: Radiation Oncology | Admitting: Radiation Oncology

## 2022-06-13 DIAGNOSIS — Z510 Encounter for antineoplastic radiation therapy: Secondary | ICD-10-CM | POA: Diagnosis not present

## 2022-06-13 DIAGNOSIS — C50811 Malignant neoplasm of overlapping sites of right female breast: Secondary | ICD-10-CM | POA: Diagnosis not present

## 2022-06-13 DIAGNOSIS — C50411 Malignant neoplasm of upper-outer quadrant of right female breast: Secondary | ICD-10-CM | POA: Diagnosis not present

## 2022-06-13 DIAGNOSIS — Z170 Estrogen receptor positive status [ER+]: Secondary | ICD-10-CM | POA: Diagnosis not present

## 2022-06-13 LAB — RAD ONC ARIA SESSION SUMMARY
Course Elapsed Days: 13
Plan Fractions Treated to Date: 9
Plan Prescribed Dose Per Fraction: 2.67 Gy
Plan Total Fractions Prescribed: 15
Plan Total Prescribed Dose: 40.05 Gy
Reference Point Dosage Given to Date: 24.03 Gy
Reference Point Session Dosage Given: 2.67 Gy
Session Number: 9

## 2022-06-14 ENCOUNTER — Ambulatory Visit: Payer: PPO

## 2022-06-14 ENCOUNTER — Other Ambulatory Visit: Payer: Self-pay

## 2022-06-14 ENCOUNTER — Ambulatory Visit
Admission: RE | Admit: 2022-06-14 | Discharge: 2022-06-14 | Disposition: A | Discharge status: Home / Self Care | Payer: PPO | Source: Ambulatory Visit | Attending: Radiation Oncology | Admitting: Radiation Oncology

## 2022-06-14 DIAGNOSIS — C50811 Malignant neoplasm of overlapping sites of right female breast: Secondary | ICD-10-CM

## 2022-06-14 DIAGNOSIS — Z510 Encounter for antineoplastic radiation therapy: Secondary | ICD-10-CM | POA: Diagnosis not present

## 2022-06-14 DIAGNOSIS — C50411 Malignant neoplasm of upper-outer quadrant of right female breast: Secondary | ICD-10-CM | POA: Diagnosis not present

## 2022-06-14 DIAGNOSIS — Z170 Estrogen receptor positive status [ER+]: Secondary | ICD-10-CM | POA: Diagnosis not present

## 2022-06-14 LAB — RAD ONC ARIA SESSION SUMMARY
Course Elapsed Days: 14
Plan Fractions Treated to Date: 10
Plan Prescribed Dose Per Fraction: 2.67 Gy
Plan Total Fractions Prescribed: 15
Plan Total Prescribed Dose: 40.05 Gy
Reference Point Dosage Given to Date: 26.7 Gy
Reference Point Session Dosage Given: 2.67 Gy
Session Number: 10

## 2022-06-14 MED ORDER — RADIAPLEXRX EX GEL: Freq: Once | CUTANEOUS | Status: AC

## 2022-06-15 ENCOUNTER — Ambulatory Visit
Admission: RE | Admit: 2022-06-15 | Discharge: 2022-06-15 | Disposition: A | Discharge status: Home / Self Care | Payer: PPO | Source: Ambulatory Visit | Attending: Radiation Oncology | Admitting: Radiation Oncology

## 2022-06-15 ENCOUNTER — Other Ambulatory Visit: Payer: Self-pay

## 2022-06-15 DIAGNOSIS — Z510 Encounter for antineoplastic radiation therapy: Secondary | ICD-10-CM | POA: Diagnosis not present

## 2022-06-15 DIAGNOSIS — C50811 Malignant neoplasm of overlapping sites of right female breast: Secondary | ICD-10-CM | POA: Diagnosis not present

## 2022-06-15 DIAGNOSIS — C50411 Malignant neoplasm of upper-outer quadrant of right female breast: Secondary | ICD-10-CM | POA: Diagnosis not present

## 2022-06-15 DIAGNOSIS — Z170 Estrogen receptor positive status [ER+]: Secondary | ICD-10-CM | POA: Diagnosis not present

## 2022-06-15 LAB — RAD ONC ARIA SESSION SUMMARY
Course Elapsed Days: 15
Plan Fractions Treated to Date: 11
Plan Prescribed Dose Per Fraction: 2.67 Gy
Plan Total Fractions Prescribed: 15
Plan Total Prescribed Dose: 40.05 Gy
Reference Point Dosage Given to Date: 29.37 Gy
Reference Point Session Dosage Given: 2.67 Gy
Session Number: 11

## 2022-06-16 ENCOUNTER — Other Ambulatory Visit: Payer: Self-pay

## 2022-06-16 ENCOUNTER — Ambulatory Visit
Admission: RE | Admit: 2022-06-16 | Discharge: 2022-06-16 | Disposition: A | Discharge status: Home / Self Care | Payer: PPO | Source: Ambulatory Visit | Attending: Radiation Oncology | Admitting: Radiation Oncology

## 2022-06-16 DIAGNOSIS — Z510 Encounter for antineoplastic radiation therapy: Secondary | ICD-10-CM | POA: Diagnosis not present

## 2022-06-16 DIAGNOSIS — C50411 Malignant neoplasm of upper-outer quadrant of right female breast: Secondary | ICD-10-CM | POA: Diagnosis not present

## 2022-06-16 DIAGNOSIS — C50811 Malignant neoplasm of overlapping sites of right female breast: Secondary | ICD-10-CM | POA: Diagnosis not present

## 2022-06-16 DIAGNOSIS — Z170 Estrogen receptor positive status [ER+]: Secondary | ICD-10-CM | POA: Diagnosis not present

## 2022-06-16 LAB — RAD ONC ARIA SESSION SUMMARY
Course Elapsed Days: 16
Plan Fractions Treated to Date: 12
Plan Prescribed Dose Per Fraction: 2.67 Gy
Plan Total Fractions Prescribed: 15
Plan Total Prescribed Dose: 40.05 Gy
Reference Point Dosage Given to Date: 32.04 Gy
Reference Point Session Dosage Given: 2.67 Gy
Session Number: 12

## 2022-06-17 ENCOUNTER — Ambulatory Visit: Payer: PPO

## 2022-06-17 ENCOUNTER — Inpatient Hospital Stay: Payer: PPO | Attending: Oncology | Admitting: Dietician

## 2022-06-17 NOTE — Progress Notes (Signed)
Patient did not show for nutrition appointment. Will contact scheduling to offer another appointment.

## 2022-06-20 ENCOUNTER — Other Ambulatory Visit: Payer: Self-pay

## 2022-06-20 ENCOUNTER — Ambulatory Visit
Admission: RE | Admit: 2022-06-20 | Discharge: 2022-06-20 | Disposition: A | Discharge status: Home / Self Care | Payer: PPO | Source: Ambulatory Visit | Attending: Radiation Oncology | Admitting: Radiation Oncology

## 2022-06-20 DIAGNOSIS — Z510 Encounter for antineoplastic radiation therapy: Secondary | ICD-10-CM | POA: Diagnosis not present

## 2022-06-20 DIAGNOSIS — Z170 Estrogen receptor positive status [ER+]: Secondary | ICD-10-CM | POA: Diagnosis not present

## 2022-06-20 DIAGNOSIS — C50411 Malignant neoplasm of upper-outer quadrant of right female breast: Secondary | ICD-10-CM | POA: Diagnosis not present

## 2022-06-20 DIAGNOSIS — C50811 Malignant neoplasm of overlapping sites of right female breast: Secondary | ICD-10-CM | POA: Diagnosis not present

## 2022-06-20 LAB — RAD ONC ARIA SESSION SUMMARY
Course Elapsed Days: 20
Plan Fractions Treated to Date: 13
Plan Prescribed Dose Per Fraction: 2.67 Gy
Plan Total Fractions Prescribed: 15
Plan Total Prescribed Dose: 40.05 Gy
Reference Point Dosage Given to Date: 34.71 Gy
Reference Point Session Dosage Given: 2.67 Gy
Session Number: 13

## 2022-06-21 ENCOUNTER — Other Ambulatory Visit: Payer: Self-pay

## 2022-06-21 ENCOUNTER — Ambulatory Visit
Admission: RE | Admit: 2022-06-21 | Discharge: 2022-06-21 | Disposition: A | Discharge status: Home / Self Care | Payer: PPO | Source: Ambulatory Visit | Attending: Radiation Oncology | Admitting: Radiation Oncology

## 2022-06-21 DIAGNOSIS — Z170 Estrogen receptor positive status [ER+]: Secondary | ICD-10-CM | POA: Diagnosis not present

## 2022-06-21 DIAGNOSIS — C50411 Malignant neoplasm of upper-outer quadrant of right female breast: Secondary | ICD-10-CM | POA: Diagnosis not present

## 2022-06-21 DIAGNOSIS — Z510 Encounter for antineoplastic radiation therapy: Secondary | ICD-10-CM | POA: Diagnosis not present

## 2022-06-21 DIAGNOSIS — C50811 Malignant neoplasm of overlapping sites of right female breast: Secondary | ICD-10-CM | POA: Diagnosis not present

## 2022-06-21 LAB — RAD ONC ARIA SESSION SUMMARY
Course Elapsed Days: 21
Plan Fractions Treated to Date: 14
Plan Prescribed Dose Per Fraction: 2.67 Gy
Plan Total Fractions Prescribed: 15
Plan Total Prescribed Dose: 40.05 Gy
Reference Point Dosage Given to Date: 37.38 Gy
Reference Point Session Dosage Given: 2.67 Gy
Session Number: 14

## 2022-06-22 ENCOUNTER — Ambulatory Visit: Payer: PPO

## 2022-06-22 ENCOUNTER — Other Ambulatory Visit: Payer: Self-pay

## 2022-06-22 DIAGNOSIS — Z170 Estrogen receptor positive status [ER+]: Secondary | ICD-10-CM | POA: Diagnosis not present

## 2022-06-22 DIAGNOSIS — C50411 Malignant neoplasm of upper-outer quadrant of right female breast: Secondary | ICD-10-CM | POA: Diagnosis not present

## 2022-06-22 DIAGNOSIS — Z510 Encounter for antineoplastic radiation therapy: Secondary | ICD-10-CM | POA: Diagnosis not present

## 2022-06-22 DIAGNOSIS — C50811 Malignant neoplasm of overlapping sites of right female breast: Secondary | ICD-10-CM | POA: Diagnosis not present

## 2022-06-22 LAB — RAD ONC ARIA SESSION SUMMARY
Course Elapsed Days: 22
Plan Fractions Treated to Date: 15
Plan Prescribed Dose Per Fraction: 2.67 Gy
Plan Total Fractions Prescribed: 15
Plan Total Prescribed Dose: 40.05 Gy
Reference Point Dosage Given to Date: 40.05 Gy
Reference Point Session Dosage Given: 2.67 Gy
Session Number: 15

## 2022-06-23 ENCOUNTER — Other Ambulatory Visit: Payer: Self-pay

## 2022-06-23 ENCOUNTER — Ambulatory Visit: Payer: PPO

## 2022-06-23 DIAGNOSIS — C50811 Malignant neoplasm of overlapping sites of right female breast: Secondary | ICD-10-CM | POA: Diagnosis not present

## 2022-06-23 DIAGNOSIS — Z510 Encounter for antineoplastic radiation therapy: Secondary | ICD-10-CM | POA: Diagnosis not present

## 2022-06-23 DIAGNOSIS — Z170 Estrogen receptor positive status [ER+]: Secondary | ICD-10-CM | POA: Diagnosis not present

## 2022-06-23 DIAGNOSIS — C50411 Malignant neoplasm of upper-outer quadrant of right female breast: Secondary | ICD-10-CM | POA: Diagnosis not present

## 2022-06-23 LAB — RAD ONC ARIA SESSION SUMMARY
Course Elapsed Days: 23
Plan Fractions Treated to Date: 1
Plan Prescribed Dose Per Fraction: 2 Gy
Plan Total Fractions Prescribed: 6
Plan Total Prescribed Dose: 12 Gy
Reference Point Dosage Given to Date: 2 Gy
Reference Point Session Dosage Given: 2 Gy
Session Number: 16

## 2022-06-24 ENCOUNTER — Other Ambulatory Visit: Payer: Self-pay

## 2022-06-24 ENCOUNTER — Ambulatory Visit
Admission: RE | Admit: 2022-06-24 | Discharge: 2022-06-24 | Disposition: A | Discharge status: Home / Self Care | Payer: PPO | Source: Ambulatory Visit | Attending: Radiation Oncology | Admitting: Radiation Oncology

## 2022-06-24 ENCOUNTER — Telehealth: Payer: Self-pay | Admitting: *Deleted

## 2022-06-24 DIAGNOSIS — C50811 Malignant neoplasm of overlapping sites of right female breast: Secondary | ICD-10-CM | POA: Diagnosis not present

## 2022-06-24 LAB — RAD ONC ARIA SESSION SUMMARY
Course Elapsed Days: 24
Plan Fractions Treated to Date: 2
Plan Prescribed Dose Per Fraction: 2 Gy
Plan Total Fractions Prescribed: 6
Plan Total Prescribed Dose: 12 Gy
Reference Point Dosage Given to Date: 4 Gy
Reference Point Session Dosage Given: 2 Gy
Session Number: 17

## 2022-06-24 NOTE — Telephone Encounter (Signed)
Notified by Dr. Barry Dienes that patient does not want to take letrozole any longer reporting she feels crazy and has been yelling at everyone including the cats and dogs> Per Dr. Benay Spice, he is doubtful these symptoms are related to letrozole. Can stop medication and call back in 1 week to see if symptoms are improved.  Next step would be to start Arimidex 1 mg daily and 2nd choice would be Tamoxifen 20 mg daily. Notified her son, Laverna Peace. They will stop med and call late next week w/update.

## 2022-06-27 ENCOUNTER — Other Ambulatory Visit: Payer: Self-pay

## 2022-06-27 ENCOUNTER — Ambulatory Visit
Admission: RE | Admit: 2022-06-27 | Discharge: 2022-06-27 | Disposition: A | Discharge status: Home / Self Care | Payer: PPO | Source: Ambulatory Visit | Attending: Radiation Oncology | Admitting: Radiation Oncology

## 2022-06-27 DIAGNOSIS — C50811 Malignant neoplasm of overlapping sites of right female breast: Secondary | ICD-10-CM | POA: Diagnosis not present

## 2022-06-27 LAB — RAD ONC ARIA SESSION SUMMARY
Course Elapsed Days: 27
Plan Fractions Treated to Date: 3
Plan Prescribed Dose Per Fraction: 2 Gy
Plan Total Fractions Prescribed: 6
Plan Total Prescribed Dose: 12 Gy
Reference Point Dosage Given to Date: 6 Gy
Reference Point Session Dosage Given: 2 Gy
Session Number: 18

## 2022-06-28 ENCOUNTER — Ambulatory Visit
Admission: RE | Admit: 2022-06-28 | Discharge: 2022-06-28 | Disposition: A | Discharge status: Home / Self Care | Payer: PPO | Source: Ambulatory Visit | Attending: Radiation Oncology | Admitting: Radiation Oncology

## 2022-06-28 ENCOUNTER — Other Ambulatory Visit: Payer: Self-pay

## 2022-06-28 DIAGNOSIS — C50811 Malignant neoplasm of overlapping sites of right female breast: Secondary | ICD-10-CM | POA: Diagnosis not present

## 2022-06-28 LAB — RAD ONC ARIA SESSION SUMMARY
Course Elapsed Days: 28
Plan Fractions Treated to Date: 4
Plan Prescribed Dose Per Fraction: 2 Gy
Plan Total Fractions Prescribed: 6
Plan Total Prescribed Dose: 12 Gy
Reference Point Dosage Given to Date: 8 Gy
Reference Point Session Dosage Given: 2 Gy
Session Number: 19

## 2022-06-29 ENCOUNTER — Ambulatory Visit
Admission: RE | Admit: 2022-06-29 | Discharge: 2022-06-29 | Disposition: A | Discharge status: Home / Self Care | Payer: PPO | Source: Ambulatory Visit | Attending: Radiation Oncology | Admitting: Radiation Oncology

## 2022-06-29 ENCOUNTER — Other Ambulatory Visit: Payer: Self-pay

## 2022-06-29 ENCOUNTER — Ambulatory Visit: Payer: PPO

## 2022-06-29 DIAGNOSIS — Z170 Estrogen receptor positive status [ER+]: Secondary | ICD-10-CM | POA: Diagnosis not present

## 2022-06-29 DIAGNOSIS — C50811 Malignant neoplasm of overlapping sites of right female breast: Secondary | ICD-10-CM | POA: Diagnosis not present

## 2022-06-29 DIAGNOSIS — C50411 Malignant neoplasm of upper-outer quadrant of right female breast: Secondary | ICD-10-CM | POA: Diagnosis not present

## 2022-06-29 DIAGNOSIS — Z510 Encounter for antineoplastic radiation therapy: Secondary | ICD-10-CM | POA: Diagnosis not present

## 2022-06-29 LAB — RAD ONC ARIA SESSION SUMMARY
Course Elapsed Days: 29
Plan Fractions Treated to Date: 5
Plan Prescribed Dose Per Fraction: 2 Gy
Plan Total Fractions Prescribed: 6
Plan Total Prescribed Dose: 12 Gy
Reference Point Dosage Given to Date: 10 Gy
Reference Point Session Dosage Given: 2 Gy
Session Number: 20

## 2022-06-30 ENCOUNTER — Other Ambulatory Visit: Payer: Self-pay

## 2022-06-30 ENCOUNTER — Ambulatory Visit
Admission: RE | Admit: 2022-06-30 | Discharge: 2022-06-30 | Disposition: A | Discharge status: Home / Self Care | Payer: PPO | Source: Ambulatory Visit | Attending: Radiation Oncology | Admitting: Radiation Oncology

## 2022-06-30 DIAGNOSIS — C50811 Malignant neoplasm of overlapping sites of right female breast: Secondary | ICD-10-CM | POA: Diagnosis not present

## 2022-06-30 LAB — RAD ONC ARIA SESSION SUMMARY
Course Elapsed Days: 30
Plan Fractions Treated to Date: 6
Plan Prescribed Dose Per Fraction: 2 Gy
Plan Total Fractions Prescribed: 6
Plan Total Prescribed Dose: 12 Gy
Reference Point Dosage Given to Date: 12 Gy
Reference Point Session Dosage Given: 2 Gy
Session Number: 21

## 2022-06-30 NOTE — Radiation Completion Notes (Signed)
Patient Name: Kristen [name], Kristen [name] MRN: [id number] Date of Birth: [birth year] Referring Physician: Betsy Coder, M.D. Date of Service: 2022-06-30 Radiation Oncologist: Teryl Lucy, M.D. [place] END OF TREATMENT NOTE     Diagnosis: C50.411 Malignant neoplasm of upper-outer quadrant of right female breast Staging on 2022-05-11: Breast cancer ([place]) T=cT1c, N=cN0, M=cM0 Intent: Curative     ==========DELIVERED PLANS==========  First Treatment Date: 2022-05-31 - Last Treatment Date: 2022-06-30   Plan Name: Breast_R Site: Breast, Right Technique: 3D Mode: Photon Dose Per Fraction: 2.67 Gy Prescribed Dose (Delivered / Prescribed): 40.05 Gy / 40.05 Gy Prescribed Fxs (Delivered / Prescribed): 15 / 15   Plan Name: Breast_R_Bst Site: Breast, Right Technique: 3D Mode: Photon Dose Per Fraction: 2 Gy Prescribed Dose (Delivered / Prescribed): 12 Gy / 12 Gy Prescribed Fxs (Delivered / Prescribed): 6 / 6     ==========ON TREATMENT VISIT DATES========== 2022-05-31, 2022-06-14, 2022-06-21, 2022-06-28     ==========UPCOMING VISITS==========       ==========APPENDIX - ON TREATMENT VISIT NOTES==========   See weekly On Treatment Notes is Epic for details.

## 2022-07-01 ENCOUNTER — Telehealth: Payer: Self-pay | Admitting: *Deleted

## 2022-07-01 MED ORDER — ANASTROZOLE 1 MG PO TABS: 1.0000 mg | ORAL_TABLET | Freq: Every day | ORAL | 0 refills | Status: DC

## 2022-07-01 NOTE — Telephone Encounter (Signed)
Left VM for son to call w/update on his mother and the symptoms she had last week since letrozole was discontinued.

## 2022-07-01 NOTE — Telephone Encounter (Signed)
Son reports she is back to her baseline and thinks she will agree to try the anastrazole now. Script sent to pharmacy.

## 2022-07-10 ENCOUNTER — Emergency Department (HOSPITAL_COMMUNITY)
Admission: EM | Admit: 2022-07-10 | Discharge: 2022-07-10 | Disposition: A | Discharge status: Home / Self Care | Payer: PPO | Attending: Student | Admitting: Student

## 2022-07-10 ENCOUNTER — Encounter (HOSPITAL_COMMUNITY): Payer: Self-pay | Admitting: *Deleted

## 2022-07-10 ENCOUNTER — Encounter (HOSPITAL_COMMUNITY): Payer: Self-pay

## 2022-07-10 ENCOUNTER — Other Ambulatory Visit: Payer: Self-pay

## 2022-07-10 ENCOUNTER — Emergency Department (HOSPITAL_COMMUNITY): Payer: PPO

## 2022-07-10 DIAGNOSIS — I10 Essential (primary) hypertension: Secondary | ICD-10-CM | POA: Insufficient documentation

## 2022-07-10 DIAGNOSIS — Z85038 Personal history of other malignant neoplasm of large intestine: Secondary | ICD-10-CM | POA: Diagnosis not present

## 2022-07-10 DIAGNOSIS — R42 Dizziness and giddiness: Secondary | ICD-10-CM | POA: Diagnosis not present

## 2022-07-10 DIAGNOSIS — R1111 Vomiting without nausea: Secondary | ICD-10-CM | POA: Diagnosis not present

## 2022-07-10 DIAGNOSIS — R001 Bradycardia, unspecified: Secondary | ICD-10-CM | POA: Diagnosis not present

## 2022-07-10 DIAGNOSIS — Z79899 Other long term (current) drug therapy: Secondary | ICD-10-CM | POA: Insufficient documentation

## 2022-07-10 DIAGNOSIS — Z853 Personal history of malignant neoplasm of breast: Secondary | ICD-10-CM | POA: Insufficient documentation

## 2022-07-10 DIAGNOSIS — E119 Type 2 diabetes mellitus without complications: Secondary | ICD-10-CM | POA: Insufficient documentation

## 2022-07-10 DIAGNOSIS — T50904A Poisoning by unspecified drugs, medicaments and biological substances, undetermined, initial encounter: Secondary | ICD-10-CM | POA: Diagnosis not present

## 2022-07-10 DIAGNOSIS — T887XXA Unspecified adverse effect of drug or medicament, initial encounter: Secondary | ICD-10-CM | POA: Diagnosis not present

## 2022-07-10 DIAGNOSIS — R0600 Dyspnea, unspecified: Secondary | ICD-10-CM | POA: Diagnosis not present

## 2022-07-10 LAB — CBC WITH DIFFERENTIAL/PLATELET
Abs Immature Granulocytes: 0.02 K/uL (ref 0.00–0.07)
Basophils Absolute: 0 K/uL (ref 0.0–0.1)
Basophils Relative: 1 %
Eosinophils Absolute: 0.1 K/uL (ref 0.0–0.5)
Eosinophils Relative: 2 %
HCT: 32 % — ABNORMAL LOW (ref 36.0–46.0)
Hemoglobin: 10.4 g/dL — ABNORMAL LOW (ref 12.0–15.0)
Immature Granulocytes: 0 %
Lymphocytes Relative: 15 %
Lymphs Abs: 0.7 K/uL (ref 0.7–4.0)
MCH: 29.5 pg (ref 26.0–34.0)
MCHC: 32.5 g/dL (ref 30.0–36.0)
MCV: 90.9 fL (ref 80.0–100.0)
Monocytes Absolute: 0.2 K/uL (ref 0.1–1.0)
Monocytes Relative: 5 %
Neutro Abs: 3.5 K/uL (ref 1.7–7.7)
Neutrophils Relative %: 77 %
Platelets: 172 K/uL (ref 150–400)
RBC: 3.52 MIL/uL — ABNORMAL LOW (ref 3.87–5.11)
RDW: 12.8 % (ref 11.5–15.5)
WBC: 4.6 K/uL (ref 4.0–10.5)
nRBC: 0 % (ref 0.0–0.2)

## 2022-07-10 LAB — TROPONIN I (HIGH SENSITIVITY)
Troponin I (High Sensitivity): 3 ng/L
Troponin I (High Sensitivity): 4 ng/L (ref <18)

## 2022-07-10 LAB — COMPREHENSIVE METABOLIC PANEL
ALT: 14 U/L (ref 0–44)
AST: 20 U/L (ref 15–41)
Albumin: 3.7 g/dL (ref 3.5–5.0)
Alkaline Phosphatase: 65 U/L (ref 38–126)
Anion gap: 8 (ref 5–15)
BUN: 14 mg/dL (ref 8–23)
CO2: 24 mmol/L (ref 22–32)
Calcium: 8.3 mg/dL — ABNORMAL LOW (ref 8.9–10.3)
Chloride: 105 mmol/L (ref 98–111)
Creatinine, Ser: 0.67 mg/dL (ref 0.44–1.00)
GFR, Estimated: >60 mL/min (ref >60)
Glucose, Bld: 116 mg/dL — ABNORMAL HIGH (ref 70–99)
Potassium: 3.6 mmol/L (ref 3.5–5.1)
Sodium: 137 mmol/L (ref 135–145)
Total Bilirubin: 0.8 mg/dL (ref 0.3–1.2)
Total Protein: 6.3 g/dL — ABNORMAL LOW (ref 6.5–8.1)

## 2022-07-10 LAB — MAGNESIUM: Magnesium: 2.1 mg/dL (ref 1.7–2.4)

## 2022-07-10 MED ORDER — ONDANSETRON HCL 4 MG/2ML IJ SOLN
4.0000 mg | Freq: Once | INTRAMUSCULAR | Status: AC
  Administered 2022-07-10: 4 mg via INTRAVENOUS
  Filled 2022-07-10: qty 2

## 2022-07-10 MED ORDER — LACTATED RINGERS IV BOLUS
1000.0000 mL | Freq: Once | INTRAVENOUS | Status: AC
  Administered 2022-07-10: 1000 mL via INTRAVENOUS

## 2022-07-10 NOTE — ED Notes (Signed)
Pt assisted to the bathroom via wheel chair

## 2022-07-10 NOTE — ED Triage Notes (Addendum)
Pt BIB [facility] for dizziness and emesis.  Reported that pt took one of her HTN medications today due to dizziness but also was reported that pt has not taken HTN meds since Jan. 6.  pt states she was taken off of them.  Pt with mild dizziness currently. Denies any pain.   Pt states she noticed the dizziness at 1000 this morning,

## 2022-07-10 NOTE — ED Notes (Signed)
Pt ambulated about 50 feet, sats dropped to 93 initially, but by the time we got her settled back in the bed, they dropped as low as 88.

## 2022-07-10 NOTE — ED Notes (Signed)
Removed IV's. Went over [employer]. Verbalized understanding. Ambulatory to lobby.

## 2022-07-10 NOTE — ED Provider Notes (Signed)
[facility] Provider Note  CSN: [id number] Arrival date & time: 07/10/22 1638  Chief Complaint(s) No chief complaint on file.  HPI Kristen [name] is a 82 y.o. female with PMH breast cancer on anastrozole, pituitary mass status postcraniotomy who presents emergency department for evaluation of lightheadedness, dizziness and nausea.  Patient states that symptoms have been worse over the last 24 hours and significant worsened at approximately 10 AM today.  She states that she was feeling dizzy and lightheaded and thought that perhaps her blood pressure was elevated despite being taken off of her blood pressure medications she took a pill of the losartan with no significant change in her symptoms.  She currently denies chest pain, shortness of breath, abdominal pain, vomiting or other systemic symptoms.  She does state that her dizziness is worse when moving rapidly or with escalating exertion.   Past Medical History Past Medical History:  Diagnosis Date   Arthritis    Breast cancer, right ([place])    Colon cancer ([place]) 1990s, 2014   Colon polyps    Depression    Dysrhythmia 04/06/2012   GERD (gastroesophageal reflux disease)    Headache(784.0)    hx migraines   Hypertension 1999   Pituitary mass ([place]) 04/06/2012   MASS FOUND AFTER PT EXPERIENCED BILATERAL BLURRED / HAZY VISION -ESP LEFT EYE.  S/P CRANIOTOMY AND EYESIGHT RETURNED TO NORMAL--PT STILL HAS A LOT OF TENDERNESS RIGHT SIDE OF NOSE --THE CRANIOTOMY WAS DONE BY TRANSNASAL APPROACH   Pneumonia yrs ago   hx of pneumonia   PONV (postoperative nausea and vomiting)    PT STATES PLEASE NOTE SHE HAS TENDERNESS RIGHT SIDE OF NOSE - SINCE TRANSNASAL CRANIOTOMY 04/2012   Pre-diabetes    Prediabetes    Vitamin D deficiency    Patient Active Problem List   Diagnosis Date Noted   Breast cancer ([place]) 05/11/2022   Osteopenia 11/23/2020   Bradycardia 04/20/2020   Abnormal glucose 06/25/2019    Essential hypertension 08/19/2014   Pituitary tumor (excised 2013) 03/12/2014   Medication management 09/10/2013   Hyperlipidemia, mixed 06/11/2013   Vitamin D deficiency    Prediabetes    History of colon cancer, stage II 10/12/2012   Overweight (BMI 25.0-29.9) 07/01/2012   GERD (gastroesophageal reflux disease)    Major depression in remission ([place])    SVT (supraventricular tachycardia) 04/21/2012   Home Medication(s) Prior to Admission medications   Medication Sig Start Date End Date Taking? Authorizing Provider  amLODipine (NORVASC) 2.5 MG tablet Take  1 tablet  Daily for BP                                                             /                                                           Take                                 by  mouth                                  once daily 02/25/22   Unk Pinto, MD  anastrozole (ARIMIDEX) 1 MG tablet Take 1 tablet (1 mg total) by mouth daily. 07/01/22   Ladell Pier, MD  atorvastatin (LIPITOR) 40 MG tablet Take  1 tablet  Daily  for Cholesterol Patient not taking: Reported on 04/04/2022 05/24/21   Unk Pinto, MD  bisoprolol-hydrochlorothiazide [facility]) 2.5-6.25 MG tablet Take  1 tablet  Daily   for BP                                                                        /                                             Take                              by                        mouth                                once daily Patient not taking: Reported on 05/11/2022 02/25/22   Unk Pinto, MD  Cholecalciferol 50 MCG (2000 UT) CHEW Chew 6,000 Units by mouth daily. Gummies    [provider]  doxycycline (VIBRA-TABS) 100 MG tablet Take 1 tablet (100 mg total) by mouth 2 (two) times daily. Take x 7 days 05/11/22   Ladell Pier, MD  doxycycline (VIBRA-TABS) 100 MG tablet Take 1 tablet (100 mg total) by mouth 2 (two) times daily. 05/19/22   Marlynn Perking, PA-C  meclizine (ANTIVERT) 25 MG tablet  Take 1 tablet 2 to 3 x /day as needed for Vertigo                                          /                        TAKE                     BY                         MOUTH 10/04/21   Unk Pinto, MD  naproxen (NAPROSYN) 375 MG tablet Take 1 tablet (375 mg total) by mouth 2 (two) times daily. Take with food Patient not taking: Reported on 05/11/2022 02/14/22   Triplett, Tammy, PA-C  olmesartan (BENICAR) 40 MG tablet Take  1 tablet  at Night  for BP                                                            /  TAKE                                           BY                              MOUTH Patient not taking: Reported on 05/11/2022 02/25/22   Unk Pinto, MD  ondansetron (ZOFRAN) 8 MG tablet Take 1 tablet (8 mg total) by mouth every 8 (eight) hours as needed for nausea or vomiting. 05/11/22   Ladell Pier, MD  oxyCODONE (OXY IR/ROXICODONE) 5 MG immediate release tablet Take 0.5-1 tablets (2.5-5 mg total) by mouth every 6 (six) hours as needed for severe pain. Patient not taking: Reported on 05/11/2022 04/07/22   Stark Klein, MD  PARoxetine (PAXIL) 30 MG tablet Take  1 tablet Daily for Mood                                        /                                   TAKE                                                BY                                  MOUTH                                    ONCE DAILY Patient not taking: Reported on 05/11/2022 02/25/22   Unk Pinto, MD  traMADol (ULTRAM) 50 MG tablet Take 1 tablet (50 mg total) by mouth every 6 (six) hours as needed. Patient not taking: Reported on 02/21/2022 02/14/22   Kem Parkinson, PA-C                                                                                                                                    Past Surgical History Past Surgical History:  Procedure Laterality Date   BACK SURGERY     lower   BREAST LUMPECTOMY WITH RADIOACTIVE SEED LOCALIZATION Right 04/07/2022    Procedure: RIGHT BREAST LUMPECTOMY WITH RADIOACTIVE SEED LOCALIZATION X2;  Surgeon: Stark Klein, MD;  Location: [facility];  Service: General;  Laterality: Right;   CATARACT EXTRACTION  June/july 2013  bilateral eyes   COLON RESECTION  1988   COLON SURGERY  1990's   COLONOSCOPY  09/20/2011   Procedure: COLONOSCOPY;  Surgeon: Missy Sabins, MD;  Location: [facility];  Service: Endoscopy;  Laterality: N/A;   COLONOSCOPY  12/15/2011   Procedure: COLONOSCOPY;  Surgeon: Missy Sabins, MD;  Location: [facility];  Service: Endoscopy;  Laterality: N/A;   COLONOSCOPY  01/26/2012   Procedure: COLONOSCOPY;  Surgeon: Cleotis Nipper, MD;  Location: [facility];  Service: Endoscopy;  Laterality: N/A;   CRANIOTOMY  04/18/2012   Procedure: CRANIOTOMY HYPOPHYSECTOMY TRANSNASAL APPROACH;  Surgeon: Ophelia Charter, MD;  Location: [facility];  Service: Neurosurgery;  Laterality: Bilateral;  Transphenoidal resection of Pituitary tumor.    CYSTOSCOPY WITH URETEROSCOPY AND STENT PLACEMENT Bilateral 10/01/2015   Procedure: CYSTOSCOPY, retrograde WITH URETEROSCOPY AND STENT PLACEMENT, ;  Surgeon: Raynelle Bring, MD;  Location: [facility];  Service: Urology;  Laterality: Bilateral;   CYSTOSCOPY WITH URETEROSCOPY AND STENT PLACEMENT Right 10/19/2015   Procedure: CYSTOSCOPY,  RIGHT URETEROSCOPY, HOLMIUM LASER RIGHT URETERAL STONE, RIGHT URETERAL STENT PLACEMENT, LEFT URETEROSCOPY, INSERTION LEFT URETERAL STENT;  Surgeon: Raynelle Bring, MD;  Location: [facility];  Service: Urology;  Laterality: Right;   EYE SURGERY     cataract bil   HOLMIUM LASER APPLICATION Right 09/19/6061   Procedure: HOLMIUM LASER  ;  Surgeon: Raynelle Bring, MD;  Location: [facility];  Service: Urology;  Laterality: Right;   HOT HEMOSTASIS  09/20/2011   Procedure: HOT HEMOSTASIS (ARGON PLASMA COAGULATION/BICAP);  Surgeon: Missy Sabins, MD;  Location: [facility];  Service: Endoscopy;  Laterality: N/A;   LAPAROSCOPIC RIGHT HEMI COLECTOMY  06/27/2012    Procedure: LAPAROSCOPIC RIGHT HEMI COLECTOMY;  Surgeon: Pedro Earls, MD;  Location: [facility];  Service: General;  Laterality: N/A;  Laparoscopic Assited Right Hemicolectomy   TRANSNASAL APPROACH  04/18/2012   Procedure: TRANSNASAL APPROACH;  Surgeon: Rozetta Nunnery, MD;  Location: [facility];  Service: ENT;  Laterality: Bilateral;  Transnasal approach   TUMOR REMOVAL     Left leg   Family History Family History  Problem Relation Age of Onset   Heart disease Mother    Heart disease Father    Colon cancer Sister 60   Breast cancer Sister        in 24's   Dementia Sister    Cancer Maternal Aunt        unknown form of cancer dx in her 95s-50s   Liver cancer Maternal Uncle        heavy ETOH user   Colon cancer Cousin    Brain cancer Cousin        dx in his 39s; paternal cousin   Colon polyps Son    Colon polyps Son    Colon cancer Other 33   Colon polyps Other     Social History Social History   Tobacco Use   Smoking status: Never   Smokeless tobacco: Never  Vaping Use   Vaping Use: Never used  Substance Use Topics   Alcohol use: Yes    Comment: a couple times a year, one drink at a time   Drug use: No   Allergies Aspirin, Codeine, Fenofibrate, Gabapentin, Hydrochlorothiazide, and Metoclopramide  Review of Systems Review of Systems  Constitutional:  Positive for fatigue.  Gastrointestinal:  Positive for nausea.  Neurological:  Positive for dizziness and light-headedness.    Physical Exam Vital Signs  I have reviewed the triage vital signs BP Marland Kitchen)  165/69   Pulse (!) 44   Temp 97.7 F (36.5 C) (Oral)   Resp 14   Ht '5\' 5"'$  (1.651 m)   Wt 60.8 kg   SpO2 99%   BMI 22.30 kg/m   Physical Exam Vitals and nursing note reviewed.  Constitutional:      General: She is not in acute distress.    Appearance: She is well-developed.  HENT:     Head: Normocephalic and atraumatic.  Eyes:     Conjunctiva/sclera: Conjunctivae normal.  Cardiovascular:     Rate  and Rhythm: Regular rhythm. Bradycardia present.     Heart sounds: No murmur heard. Pulmonary:     Effort: Pulmonary effort is normal. No respiratory distress.     Breath sounds: Normal breath sounds.  Abdominal:     Palpations: Abdomen is soft.     Tenderness: There is no abdominal tenderness.  Musculoskeletal:        General: No swelling.     Cervical back: Neck supple.  Skin:    General: Skin is warm and dry.     Capillary Refill: Capillary refill takes less than 2 seconds.  Neurological:     Mental Status: She is alert.  Psychiatric:        Mood and Affect: Mood normal.     ED Results and Treatments Labs (all labs ordered are listed, but only abnormal results are displayed) Labs Reviewed  COMPREHENSIVE METABOLIC PANEL  CBC WITH DIFFERENTIAL/PLATELET  MAGNESIUM  TROPONIN I (HIGH SENSITIVITY)                                                                                                                          Radiology No results found.  Pertinent labs & imaging results that were available during my care of the patient were reviewed by me and considered in my medical decision making (see MDM for details).  Medications Ordered in ED Medications  lactated ringers bolus 1,000 mL (has no administration in time range)  ondansetron (ZOFRAN) injection 4 mg (has no administration in time range)                                                                                                                                     Procedures Procedures  (including critical care time)  Medical Decision Making / ED Course   This patient presents to the ED for concern  of lightheadedness, this involves an extensive number of treatment options, and is a complaint that carries with it a high risk of complications and morbidity.  The differential diagnosis includes dehydration, electrolyte abnormality, symptomatic bradycardia, ACS  MDM: Seen emergency room for evaluation of  lightheadedness.  Physical exam with regular bradycardia but is otherwise unremarkable.  ECG was sinus bradycardia no evidence of heart block.  Laboratory evaluation with hemoglobin of 10.4 which is not far from baseline.  High-sensitivity troponin normal, electrolytes unremarkable.  Chest x-ray unremarkable.  Patient received Zofran and a fluid bolus and on reevaluation, her symptoms have completely resolved.  An amatory pulse ox trial was performed that initially may have shown some hypoxia, but I repeated this test and she had no hypoxia or exertional symptoms at all.  I did speak with the cardiologist on-call Dr. Koleen Nimrod who reviewed the ECG and agrees that this is sinus bradycardia and with no exertional symptoms, is safe for outpatient follow-up.  On last follow-up, patient again feels symptomatically improved and is requesting be discharged home which is reasonable.  Patient then discharged.   Additional history obtained: -Additional history obtained from son -External records from outside source obtained and reviewed including: Chart review including previous notes, labs, imaging, consultation notes   Lab Tests: -I ordered, reviewed, and interpreted labs.   The pertinent results include:   Labs Reviewed  COMPREHENSIVE METABOLIC PANEL  CBC WITH DIFFERENTIAL/PLATELET  MAGNESIUM  TROPONIN I (HIGH SENSITIVITY)      EKG   EKG Interpretation  Date/Time:  Sunday July 10 2022 17:05:59 EST Ventricular Rate:  50 PR Interval:  217 QRS Duration: 99 QT Interval:  490 QTC Calculation: 447 R Axis:   10 Text Interpretation: Sinus rhythm Borderline prolonged PR interval Low voltage, extremity and precordial leads Confirmed by [place] ([id number]) on 07/10/2022 5:26:38 PM         Imaging Studies ordered: I ordered imaging studies including chest x-ray I independently visualized and interpreted imaging. I agree with the radiologist interpretation   Medicines ordered and  prescription drug management: Meds ordered this encounter  Medications   lactated ringers bolus 1,000 mL   ondansetron (ZOFRAN) injection 4 mg    -I have reviewed the patients home medicines and have made adjustments as needed  Critical interventions none    Cardiac Monitoring: The patient was maintained on a cardiac monitor.  I personally viewed and interpreted the cardiac monitored which showed an underlying rhythm of: Sinus bradycardia  Social Determinants of Health:  Factors impacting patients care include: none   Reevaluation: After the interventions noted above, I reevaluated the patient and found that they have :improved  Co morbidities that complicate the patient evaluation  Past Medical History:  Diagnosis Date   Arthritis    Breast cancer, right ([place])    Colon cancer ([place]) 1990s, 2014   Colon polyps    Depression    Dysrhythmia 04/06/2012   GERD (gastroesophageal reflux disease)    Headache(784.0)    hx migraines   Hypertension 1999   Pituitary mass ([place]) 04/06/2012   MASS FOUND AFTER PT EXPERIENCED BILATERAL BLURRED / HAZY VISION -ESP LEFT EYE.  S/P CRANIOTOMY AND EYESIGHT RETURNED TO NORMAL--PT STILL HAS A LOT OF TENDERNESS RIGHT SIDE OF NOSE --THE CRANIOTOMY WAS DONE BY TRANSNASAL APPROACH   Pneumonia yrs ago   hx of pneumonia   PONV (postoperative nausea and vomiting)    PT STATES PLEASE NOTE SHE HAS TENDERNESS RIGHT SIDE OF NOSE - SINCE TRANSNASAL  CRANIOTOMY 04/2012   Pre-diabetes    Prediabetes    Vitamin D deficiency       Dispostion: I considered admission for this patient, but at this time she does not meet inpatient criteria for admission and she is safe for discharge with outpatient follow-up     Final Clinical Impression(s) / ED Diagnoses Final diagnoses:  None     '@PCDICTATION'$ @    Teressa Lower, MD 07/10/22 2016

## 2022-07-10 NOTE — ED Notes (Signed)
Ambulated patient in room. Patient denies dizziness. Maintained oxygen of 97-100%. States she feels ready to go home.

## 2022-07-20 ENCOUNTER — Inpatient Hospital Stay: Payer: PPO

## 2022-07-20 ENCOUNTER — Inpatient Hospital Stay: Payer: PPO | Attending: Oncology | Admitting: Genetic Counselor

## 2022-07-25 NOTE — Progress Notes (Signed)
Kristen [name] presents today for follow-up after completing radiation to her right breast   Pain: Skin:  ROM:  Lymphedema:  MedOnc F/U:  Other issues of note:   Pt reports Yes No Comments  Tamoxifen '[]'$  '[]'$    Letrozole '[]'$  '[]'$    Anastrazole '[]'$  '[]'$    Mammogram '[]'$  Date:  '[]'$ 

## 2022-07-28 ENCOUNTER — Encounter: Payer: Self-pay | Admitting: Radiation Oncology

## 2022-08-01 ENCOUNTER — Ambulatory Visit
Admission: RE | Admit: 2022-08-01 | Discharge: 2022-08-01 | Disposition: A | Discharge status: Home / Self Care | Payer: PPO | Source: Ambulatory Visit | Attending: Radiation Oncology | Admitting: Radiation Oncology

## 2022-08-01 HISTORY — DX: Personal history of irradiation: Z92.3

## 2022-08-01 NOTE — Progress Notes (Signed)
Patient son states that his mo forgot about the appointment and needs to reschedule. Will notify provider and scheduler.

## 2022-08-04 ENCOUNTER — Encounter: Payer: Self-pay | Admitting: Internal Medicine

## 2022-08-04 ENCOUNTER — Ambulatory Visit: Payer: PPO | Admitting: Radiation Oncology

## 2022-08-04 ENCOUNTER — Ambulatory Visit: Payer: Self-pay | Admitting: Internal Medicine

## 2022-08-04 DIAGNOSIS — I10 Essential (primary) hypertension: Secondary | ICD-10-CM

## 2022-08-04 NOTE — Progress Notes (Signed)
N  O       S  H  O  W   &   Needs a 3 mo OV & WELLNESS w/Tonya                                                                                                                                                                                                                                                                                                                                             Future Appointments  Date Time Provider Department  08/04/2022              6 mo ov  2:30 PM Unk Pinto, MD [facility]  08/08/2022 11:45 AM Gery Pray, MD [facility]  09/08/2022 10:40 AM Ladell Pier, MD [facility]  02/03/2023                cpe 11:00 AM Unk Pinto, MD [facility]  05/15/2023               3 mo ov 11:00 AM Darrol Jump, NP [facility]    History of Present Illness:       This very nice 82 y.o. WWF presents for 6 month follow up with HTN, HLD, Pre-Diabetes and Vitamin D Deficiency.        In Apr 07, 2022, patient underwent R Breast Lumpectomy by Dr Barry Dienes.  Patient completed po radiation and was initiated on Letrozole by dr Benay Spice.        Patient is treated for HTN (1999) & BP has been controlled at home. Today's BP is at goal -                                     .  Patient had a negative Cardiolite in 2011.  Patient has had no complaints of any cardiac type chest pain, palpitations, dyspnea / orthopnea / PND, dizziness, claudication, or dependent edema.        Hyperlipidemia is controlled with diet &  lipitor . Patient denies myalgias or other med SE's. Last Lipids were at goal:  Lab Results  Component Value Date   CHOL 139  01/31/2022   HDL 46 (L) 01/31/2022   LDLCALC 73 01/31/2022   TRIG 112 01/31/2022   CHOLHDL 3.0 01/31/2022     Also, the patient has history of PreDiabetes (A1c 5.9% /I 90 /2011) and has had no symptoms of reactive hypoglycemia, diabetic polys, paresthesias or visual blurring.  Last A1c was near goal :  Lab Results  Component Value Date   HGBA1C 5.7 (H) 01/31/2022            Further, the patient also has history of Vitamin D Deficiency ("31" /2008) and supplements vitamin D without any suspected side-effects. Last vitamin D was at goal:  Lab Results  Component Value Date   VD25OH 56 01/31/2022       Current Outpatient Medications  Medication Instructions   amLODipine (NORVASC) 2.5 MG tablet Take  1 tablet  Daily   anastrozole (ARIMIDEX) 1 mg, Daily   atorvastatin (LIPITOR) 40 MG tablet Take  1 tablet  Daily  for Cholesterol   bisoprolol-hydrochlorothiazide (ZIAC) 2.5-6.25 MG tablet Take  1 tablet  Daily    Cholecalciferol 6,000 Units,  Daily, Gummies   doxycycline (VIBRA-TABS) 100 mg,  2 times daily, Take x 7 days   doxycycline (VIBRA-TABS) 100 mg,  2 times daily   meclizine (ANTIVERT) 25 MG tablet Take 1 tablet 2 to 3 x /day as needed    naproxen (NAPROSYN) 375 mg, 2 times daily, Take with food   olmesartan (BENICAR) 40 MG tablet Take  1 tablet  at Night     ondansetron (ZOFRAN) 8 mg,  Every 8 hours PRN   oxyCODONE IR 2.5-5 mg,  Every 6 hours PRN   PARoxetine (PAXIL) 30 MG tablet Take  1 tablet Daily   traMADol (ULTRAM) 50 mg, Every 6 hours PRN     Allergies  Allergen Reactions   Aspirin Other (See Comments)    High Doses stomach trouble   Codeine Nausea And Vomiting   Fenofibrate Cough   Hydrochlorothiazide Other (See Comments)    Fatigue   Metoclopramide Other (See Comments)    Sedation    PMHx:   Past Medical History:  Diagnosis Date   Arthritis    Colon cancer ([place]) 1990s, 2014   Colon polyps    Depression    Dysrhythmia 04/2012   SVT - RATE 145 POST  OP CRANIOTOMY SURGERY --TX'D AND RESOLVED--PT STATES NO FURTHER PROBLEM THAT SHE IS AWARE OF -SHE HAS NOT HAD TO SEE CARDIOLOGIST   GERD (gastroesophageal reflux disease)    Headache(784.0)    hx migraines   Hypertension    pt states she has not taken any b/p medication since her brain surgery in oct 3013--took herself off the medication   Pituitary mass ([place]) NOV 2013   MASS FOUND AFTER PT EXPERIENCED BILATERAL BLURRED / HAZY VISION -ESP LEFT EYE.  S/P CRANIOTOMY AND EYESIGHT RETURNED TO NORMAL--PT STILL HAS A LOT OF TENDERNESS RIGHT SIDE OF NOSE --THE CRANIOTOMY WAS DONE BY TRANSNASAL APPROACH   Pneumonia yrs ago   hx of pneumonia   PONV (postoperative nausea and vomiting)    PT STATES PLEASE NOTE SHE HAS TENDERNESS RIGHT  SIDE OF NOSE - SINCE TRANSNASAL CRANIOTOMY 04/2012   Prediabetes    Vitamin D deficiency     Immunization History  Administered Date(s) Administered   Influenza Split 04/19/2012   Influenza, High Dose  04/12/2018, 04/17/2019, 04/20/2020   Pneumococcal  - 13 09/30/2014   Pneumococcal  - 23 04/19/2012   Td 07/20/2005, 12/01/2015    Past Surgical History:  Procedure Laterality Date   BACK SURGERY     lower   CATARACT EXTRACTION  June/july 2013   bilateral eyes   COLON RESECTION  1988   COLON SURGERY  1990's   COLONOSCOPY  09/20/2011   Procedure: COLONOSCOPY;  Surgeon: Missy Sabins, MD;  Location: [facility];  Service: Endoscopy;  Laterality: N/A;   COLONOSCOPY  12/15/2011   Procedure: COLONOSCOPY;  Surgeon: Missy Sabins, MD;  Location: [place];  Service: Endoscopy;  Laterality: N/A;   COLONOSCOPY  01/26/2012   Procedure: COLONOSCOPY;  Surgeon: Cleotis Nipper, MD;  Location: [facility];  Service: Endoscopy;  Laterality: N/A;   CRANIOTOMY  04/18/2012   Procedure: CRANIOTOMY HYPOPHYSECTOMY TRANSNASAL APPROACH;  Surgeon: Ophelia Charter, MD;  Location: [place] [facility];  Service: Neurosurgery;  Laterality: Bilateral;  Transphenoidal resection of Pituitary  tumor.    CYSTOSCOPY WITH URETEROSCOPY AND STENT PLACEMENT Bilateral 10/01/2015   Procedure: CYSTOSCOPY, retrograde WITH URETEROSCOPY AND STENT PLACEMENT, ;  Surgeon: Raynelle Bring, MD;  Location: [facility];  Service: Urology;  Laterality: Bilateral;   CYSTOSCOPY WITH URETEROSCOPY AND STENT PLACEMENT Right 10/19/2015   Procedure: CYSTOSCOPY,  RIGHT URETEROSCOPY, HOLMIUM LASER RIGHT URETERAL STONE, RIGHT URETERAL STENT PLACEMENT, LEFT URETEROSCOPY, INSERTION LEFT URETERAL STENT;  Surgeon: Raynelle Bring, MD;  [facility];  Service: Urology;  Laterality: Right;   EYE SURGERY     cataract bil   HOLMIUM LASER APPLICATION Right [id number]   Procedure: HOLMIUM LASER  ;  Surgeon: Raynelle Bring, MD;  [facility];  Service: Urology;  Laterality: Right;   HOT HEMOSTASIS  09/20/2011   Procedure: HOT HEMOSTASIS (ARGON PLASMA COAGULATION/BICAP);  Surgeon: Missy Sabins, MD;  Location[facility];  Service: Endoscopy;  Laterality: N/A;   LAPAROSCOPIC RIGHT HEMI COLECTOMY  06/27/2012   Procedure: LAPAROSCOPIC RIGHT HEMI COLECTOMY;  Surgeon: Pedro Earls, MD;  [facility];  Service: General;  Laterality: N/A;  Laparoscopic Assited Right Hemicolectomy   TRANSNASAL APPROACH  04/18/2012   Procedure: TRANSNASAL APPROACH;  Surgeon: Rozetta Nunnery, MD;  Location: [facility];  Service: ENT;  Laterality: Bilateral;  Transnasal approach   TUMOR REMOVAL     Left leg    FHx:    Reviewed / unchanged  SHx:    Reviewed / unchanged   Systems Review:  Constitutional: Denies fever, chills, wt changes, headaches, insomnia, fatigue, night sweats, change in appetite. Eyes: Denies redness, blurred vision, diplopia, discharge, itchy, watery eyes.  ENT: Denies discharge, congestion, post nasal drip, epistaxis, sore throat, earache, hearing loss, dental pain, tinnitus, vertigo, sinus pain, snoring.  CV: Denies chest pain, palpitations, irregular heartbeat, syncope, dyspnea, diaphoresis, orthopnea, PND,  claudication or edema. Respiratory: denies cough, dyspnea, DOE, pleurisy, hoarseness, laryngitis, wheezing.  Gastrointestinal: Denies dysphagia, odynophagia, heartburn, reflux, water brash, abdominal pain or cramps, nausea, vomiting, bloating, diarrhea, constipation, hematemesis, melena, hematochezia  or hemorrhoids. Genitourinary: Denies dysuria, frequency, urgency, nocturia, hesitancy, discharge, hematuria or flank pain. Musculoskeletal: Denies arthralgias, myalgias, stiffness, jt. swelling, pain, limping or strain/sprain.  Skin: Denies pruritus, rash, hives, warts, acne, eczema or change in skin lesion(s). Neuro: No  weakness, tremor, incoordination, spasms, paresthesia or pain. Psychiatric: Denies confusion, memory loss or sensory loss. Endo: Denies change in weight, skin or hair change.  Heme/Lymph: No excessive bleeding, bruising or enlarged lymph nodes.  Physical Exam  There were no vitals taken for this visit.  Appears  well nourished, well groomed  and in no distress.  Eyes: PERRLA, EOMs, conjunctiva no swelling or erythema. Sinuses: No frontal/maxillary tenderness ENT/Mouth: EAC's clear, TM's nl w/o erythema, bulging. Nares clear w/o erythema, swelling, exudates. Oropharynx clear without erythema or exudates. Oral hygiene is good. Tongue normal, non obstructing. Hearing intact.  Neck: Supple. Thyroid not palpable. Car 2+/2+ without bruits, nodes or JVD. Chest: Respirations nl with BS clear & equal w/o rales, rhonchi, wheezing or stridor.  Cor: Heart sounds normal w/ regular rate and rhythm without sig. murmurs, gallops, clicks or rubs. Peripheral pulses normal and equal  without edema.  Abdomen: Soft & bowel sounds normal. Non-tender w/o guarding, rebound, hernias, masses or organomegaly.  Lymphatics: Unremarkable.  Musculoskeletal: Full ROM all peripheral extremities, joint stability, 5/5 strength and normal gait.  Skin: Warm, dry without exposed rashes, lesions or ecchymosis  apparent.  Neuro: Cranial nerves intact, reflexes equal bilaterally. Sensory-motor testing grossly intact. Tendon reflexes grossly intact.  Pysch: Alert & oriented x 3.  Insight and judgement nl & appropriate. No ideations.  Assessment and Plan:  1. Essential hypertension  - Continue medication, monitor blood pressure at home.  - Continue DASH diet.  Reminder to go to the ER if any CP,  SOB, nausea, dizziness, severe HA, changes vision/speech.  - CBC with Differential/Platelet - COMPLETE METABOLIC PANEL WITH GFR - Magnesium - TSH  2. Hyperlipidemia, mixed  - Continue diet/meds, exercise,& lifestyle modifications.  - Continue monitor periodic cholesterol/liver & renal functions    - Lipid panel - TSH  3. Abnormal glucose  - Continue diet, exercise  - Lifestyle modifications.  - Monitor appropriate labs.  - Hemoglobin A1c - Insulin, random  4. Vitamin D deficiency  - Continue supplementation.  - VITAMIN D 25 Hydroxy   5. Medication management  - CBC with Differential/Platelet - COMPLETE METABOLIC PANEL WITH GFR - Magnesium - Lipid panel - TSH - Hemoglobin A1c - Insulin, random - VITAMIN D 25 Hydroxy        Discussed  regular exercise, BP monitoring, weight control to achieve/maintain BMI less than 25 and discussed med and SE's. Recommended labs to assess and monitor clinical status with further disposition pending results of labs.  I discussed the assessment and treatment plan with the patient. The patient was provided an opportunity to ask questions and all were answered. The patient agreed with the plan and demonstrated an understanding of the instructions.  I provided over 30 minutes of exam, counseling, chart review and  complex critical decision making.         The patient was advised to call back or seek an in-person evaluation if the symptoms worsen or if the condition fails to improve as anticipated.   Kirtland Bouchard, MD

## 2022-08-08 ENCOUNTER — Ambulatory Visit
Admission: RE | Admit: 2022-08-08 | Discharge: 2022-08-08 | Disposition: A | Discharge status: Home / Self Care | Payer: PPO | Source: Ambulatory Visit | Attending: Radiation Oncology | Admitting: Radiation Oncology

## 2022-08-08 ENCOUNTER — Telehealth: Payer: Self-pay | Admitting: *Deleted

## 2022-08-08 NOTE — Telephone Encounter (Signed)
Called patient's son- Kizzie Bane to ask about rescheduling his mom's fu appt., lvm for a return call

## 2022-08-08 NOTE — Progress Notes (Signed)
Patient son states that he will check with his mom to see if she would like to reschedule the appointment that she had with Dr. Sondra Come this am. Will notify scheduler and Dr. Sondra Come.

## 2022-08-11 ENCOUNTER — Ambulatory Visit (INDEPENDENT_AMBULATORY_CARE_PROVIDER_SITE_OTHER): Payer: PPO | Admitting: Internal Medicine

## 2022-08-11 ENCOUNTER — Encounter: Payer: Self-pay | Admitting: Internal Medicine

## 2022-08-11 VITALS — BP 136/72 | HR 53 | Temp 97.9°F | Resp 17 | Ht 65.0 in | Wt 132.6 lb

## 2022-08-11 DIAGNOSIS — R7309 Other abnormal glucose: Secondary | ICD-10-CM

## 2022-08-11 DIAGNOSIS — Z79899 Other long term (current) drug therapy: Secondary | ICD-10-CM

## 2022-08-11 DIAGNOSIS — I10 Essential (primary) hypertension: Secondary | ICD-10-CM | POA: Diagnosis not present

## 2022-08-11 DIAGNOSIS — E782 Mixed hyperlipidemia: Secondary | ICD-10-CM | POA: Diagnosis not present

## 2022-08-11 DIAGNOSIS — E559 Vitamin D deficiency, unspecified: Secondary | ICD-10-CM

## 2022-08-11 DIAGNOSIS — R42 Dizziness and giddiness: Secondary | ICD-10-CM | POA: Diagnosis not present

## 2022-08-11 NOTE — Progress Notes (Signed)
Future Appointments  Date Time Provider Department  08/11/2022                    6 mo   2:30 PM Unk Pinto, MD [facility]  09/08/2022 10:40 AM Ladell Pier, MD [facility]  02/03/2023              cpe 11:00 AM Unk Pinto, MD [facility]  05/15/2023 11:00 AM Darrol Jump, NP [facility]    History of Present Illness:       This very nice 82 y.o. WWF presents for 6 month follow up with HTN, HLD, Pre-Diabetes and Vitamin D Deficiency.        In Apr 07, 2022, patient underwent R Breast Lumpectomy by Dr Barry Dienes.  Patient completed po radiation and was initiated on Letrozole by Dr Benay Spice.        Patient is treated for HTN (1999) & BP has been controlled at home. Today's BP is at goal - 136/72 .  Patient had a negative Cardiolite in 2011. Patient has had no complaints of any cardiac type chest pain, palpitations, dyspnea / orthopnea / PND, dizziness, claudication, or dependent edema.        Hyperlipidemia is controlled with diet &  lipitor . Patient denies myalgias or other med SE's. Last Lipids were at goal:  Lab Results  Component Value Date   CHOL 139 01/31/2022   HDL 46 (L) 01/31/2022   LDLCALC 73 01/31/2022   TRIG 112 01/31/2022   CHOLHDL 3.0 01/31/2022     Also, the patient has history of PreDiabetes (A1c 5.9% /I 90 /2011) and has had no symptoms of reactive hypoglycemia, diabetic polys, paresthesias or visual blurring.  Last A1c was near goal :  Lab Results  Component Value Date   HGBA1C 5.7 (H) 01/31/2022            Further, the patient also has history of Vitamin D Deficiency ("31" /2008) and supplements vitamin D without any suspected side-effects. Last vitamin D was at goal:  Lab Results  Component Value Date   VD25OH 56 01/31/2022       Current Outpatient Medications  Medication Instructions   amLODipine (NORVASC) 2.5 MG tablet Take  1 tablet  Daily   anastrozole (ARIMIDEX) 1 mg, Daily   atorvastatin (LIPITOR) 40 MG tablet Take  1 tablet   Daily  for Cholesterol   bisoprolol-hydrochlorothiazide (ZIAC) 2.5-6.25 MG tablet Take  1 tablet  Daily    Cholecalciferol 6,000 Units,  Daily, Gummies   meclizine (ANTIVERT) 25 MG tablet Take 1 tablet 2 to 3 x /day as needed    naproxen (NAPROSYN) 375 mg, 2 times daily, Take with food   olmesartan (BENICAR) 40 MG tablet Take  1 tablet  at Night     ondansetron (ZOFRAN) 8 mg,  Every 8 hours PRN   oxyCODONE IR 2.5-5 mg,  Every 6 hours PRN   PARoxetine (PAXIL) 30 MG tablet Take  1 tablet Daily   traMADol (ULTRAM) 50 mg, Every 6 hours PRN     Allergies  Allergen Reactions   Aspirin Other (See Comments)    High Doses stomach trouble   Codeine Nausea And Vomiting   Fenofibrate Cough   Hydrochlorothiazide Other (See Comments)    Fatigue   Metoclopramide Other (See Comments)    Sedation    PMHx:   Past Medical History:  Diagnosis Date   Arthritis    Colon cancer ([place]) 1990s, 2014  Colon polyps    Depression    Dysrhythmia 04/2012   SVT - RATE 145 POST OP CRANIOTOMY SURGERY --TX'D AND RESOLVED--PT STATES NO FURTHER PROBLEM THAT SHE IS AWARE OF -SHE HAS NOT HAD TO SEE CARDIOLOGIST   GERD (gastroesophageal reflux disease)    Headache(784.0)    hx migraines   Hypertension    pt states she has not taken any b/p medication since her brain surgery in oct 3013--took herself off the medication   Pituitary mass ([place]) NOV 2013   MASS FOUND AFTER PT EXPERIENCED BILATERAL BLURRED / HAZY VISION -ESP LEFT EYE.  S/P CRANIOTOMY AND EYESIGHT RETURNED TO NORMAL--PT STILL HAS A LOT OF TENDERNESS RIGHT SIDE OF NOSE --THE CRANIOTOMY WAS DONE BY TRANSNASAL APPROACH   Pneumonia yrs ago   hx of pneumonia   PONV (postoperative nausea and vomiting)    PT STATES PLEASE NOTE SHE HAS TENDERNESS RIGHT SIDE OF NOSE - SINCE TRANSNASAL CRANIOTOMY 04/2012   Prediabetes    Vitamin D deficiency     Immunization History  Administered Date(s) Administered   Influenza Split 04/19/2012   Influenza, High Dose   04/12/2018, 04/17/2019, 04/20/2020   Pneumococcal  - 13 09/30/2014   Pneumococcal  - 23 04/19/2012   Td 07/20/2005, 12/01/2015    Past Surgical History:  Procedure Laterality Date   BACK SURGERY     lower   CATARACT EXTRACTION  June/july 2013   bilateral eyes   COLON RESECTION  1988   COLON SURGERY  1990's   COLONOSCOPY  09/20/2011   Procedure: COLONOSCOPY;  Surgeon: Missy Sabins, MD;  [facility];  Service: Endoscopy;  Laterality: N/A;   COLONOSCOPY  12/15/2011   Procedure: COLONOSCOPY;  Surgeon: Missy Sabins, MD;  Location: [place];  Service: Endoscopy;  Laterality: N/A;   COLONOSCOPY  01/26/2012   Procedure: COLONOSCOPY;  Surgeon: Cleotis Nipper, MD;  [facility];  Service: Endoscopy;  Laterality: N/A;   CRANIOTOMY  04/18/2012   Procedure: CRANIOTOMY HYPOPHYSECTOMY TRANSNASAL APPROACH;  Surgeon: Ophelia Charter, MD;  Location: [place] [facility];  Service: Neurosurgery;  Laterality: Bilateral;  Transphenoidal resection of Pituitary tumor.    CYSTOSCOPY WITH URETEROSCOPY AND STENT PLACEMENT Bilateral 10/01/2015   Procedure: CYSTOSCOPY, retrograde WITH URETEROSCOPY AND STENT PLACEMENT, ;  Surgeon: Raynelle Bring, MD;  [facility];  Service: Urology;  Laterality: Bilateral;   CYSTOSCOPY WITH URETEROSCOPY AND STENT PLACEMENT Right 10/19/2015   Procedure: CYSTOSCOPY,  RIGHT URETEROSCOPY, HOLMIUM LASER RIGHT URETERAL STONE, RIGHT URETERAL STENT PLACEMENT, LEFT URETEROSCOPY, INSERTION LEFT URETERAL STENT;  Surgeon: Raynelle Bring, MD;  [facility];  Service: Urology;  Laterality: Right;   EYE SURGERY     cataract bil   HOLMIUM LASER APPLICATION Right [id number]   Procedure: HOLMIUM LASER  ;  Surgeon: Raynelle Bring, MD;  [facility];  Service: Urology;  Laterality: Right;   HOT HEMOSTASIS  09/20/2011   Procedure: HOT HEMOSTASIS (ARGON PLASMA COAGULATION/BICAP);  Surgeon: Missy Sabins, MD;  Location: [facility];  Service: Endoscopy;  Laterality: N/A;    LAPAROSCOPIC RIGHT HEMI COLECTOMY  06/27/2012   Procedure: LAPAROSCOPIC RIGHT HEMI COLECTOMY;  Surgeon: Pedro Earls, MD;  [facility];  Service: General;  Laterality: N/A;  Laparoscopic Assited Right Hemicolectomy   TRANSNASAL APPROACH  04/18/2012   Procedure: TRANSNASAL APPROACH;  Surgeon: Rozetta Nunnery, MD;  Location: [facility];  Service: ENT;  Laterality: Bilateral;  Transnasal approach   TUMOR REMOVAL     Left leg  FHx:    Reviewed / unchanged  SHx:    Reviewed / unchanged   Systems Review:  Constitutional: Denies fever, chills, wt changes, headaches, insomnia, fatigue, night sweats, change in appetite. Eyes: Denies redness, blurred vision, diplopia, discharge, itchy, watery eyes.  ENT: Denies discharge, congestion, post nasal drip, epistaxis, sore throat, earache, hearing loss, dental pain, tinnitus, vertigo, sinus pain, snoring.  CV: Denies chest pain, palpitations, irregular heartbeat, syncope, dyspnea, diaphoresis, orthopnea, PND, claudication or edema. Respiratory: denies cough, dyspnea, DOE, pleurisy, hoarseness, laryngitis, wheezing.  Gastrointestinal: Denies dysphagia, odynophagia, heartburn, reflux, water brash, abdominal pain or cramps, nausea, vomiting, bloating, diarrhea, constipation, hematemesis, melena, hematochezia  or hemorrhoids. Genitourinary: Denies dysuria, frequency, urgency, nocturia, hesitancy, discharge, hematuria or flank pain. Musculoskeletal: Denies arthralgias, myalgias, stiffness, jt. swelling, pain, limping or strain/sprain.  Skin: Denies pruritus, rash, hives, warts, acne, eczema or change in skin lesion(s). Neuro: No weakness, tremor, incoordination, spasms, paresthesia or pain. Psychiatric: Denies confusion, memory loss or sensory loss. Endo: Denies change in weight, skin or hair change.  Heme/Lymph: No excessive bleeding, bruising or enlarged lymph nodes.  Physical Exam  BP 136/72   Pulse (!) 53   Temp 97.9 F (36.6 C)    Resp 17   Ht '5\' 5"'$  (1.651 m)   Wt 132 lb 9.6 oz (60.1 kg)   SpO2 99%   BMI 22.07 kg/m   Appears  well nourished, well groomed  and in no distress.  Eyes: PERRLA, EOMs, conjunctiva no swelling or erythema. Sinuses: No frontal/maxillary tenderness ENT/Mouth: EAC's clear, TM's nl w/o erythema, bulging. Nares clear w/o erythema, swelling, exudates. Oropharynx clear without erythema or exudates. Oral hygiene is good. Tongue normal, non obstructing. Hearing intact.  Neck: Supple. Thyroid not palpable. Car 2+/2+ without bruits, nodes or JVD. Chest: Respirations nl with BS clear & equal w/o rales, rhonchi, wheezing or stridor.  Cor: Heart sounds normal w/ regular rate and rhythm without sig. murmurs, gallops, clicks or rubs. Peripheral pulses normal and equal  without edema.  Abdomen: Soft & bowel sounds normal. Non-tender w/o guarding, rebound, hernias, masses or organomegaly.  Lymphatics: Unremarkable.  Musculoskeletal: Full ROM all peripheral extremities, joint stability, 5/5 strength and normal gait.  Skin: Warm, dry without exposed rashes, lesions or ecchymosis apparent.  Neuro: Cranial nerves intact, reflexes equal bilaterally. Sensory-motor testing grossly intact. Tendon reflexes grossly intact.  Pysch: Alert & oriented x 3.  Insight and judgement nl & appropriate. No ideations.  Assessment and Plan:  1. Essential hypertension  - Continue medication, monitor blood pressure at home.  - Continue DASH diet.  Reminder to go to the ER if any CP,  SOB, nausea, dizziness, severe HA, changes vision/speech.  - CBC with Differential/Platelet - COMPLETE METABOLIC PANEL WITH GFR - Magnesium - TSH  2. Hyperlipidemia, mixed  - Continue diet/meds, exercise,& lifestyle modifications.  - Continue monitor periodic cholesterol/liver & renal functions    - Lipid panel - TSH  3. Abnormal glucose  - Continue diet, exercise  - Lifestyle modifications.  - Monitor appropriate labs.  -  Hemoglobin A1c - Insulin, random  4. Vitamin D deficiency  - Continue supplementation.  - VITAMIN D 25 Hydroxy   5. Medication management  - CBC with Differential/Platelet - COMPLETE METABOLIC PANEL WITH GFR - Magnesium - Lipid panel - TSH - Hemoglobin A1c - Insulin, random - VITAMIN D 25 Hydroxy        Discussed  regular exercise, BP monitoring, weight control to achieve/maintain BMI less than 25 and discussed med  and SE's. Recommended labs to assess and monitor clinical status with further disposition pending results of labs.  I discussed the assessment and treatment plan with the patient. The patient was provided an opportunity to ask questions and all were answered. The patient agreed with the plan and demonstrated an understanding of the instructions.  I provided over 30 minutes of exam, counseling, chart review and  complex critical decision making.         The patient was advised to call back or seek an in-person evaluation if the symptoms worsen or if the condition fails to improve as anticipated.   Kirtland Bouchard, MD

## 2022-08-11 NOTE — Patient Instructions (Signed)
Due to recent changes in healthcare laws, you may see the results of your imaging and laboratory studies on MyChart before your provider has had a chance to review them.  We understand that in some cases there may be results that are confusing or concerning to you. Not all laboratory results come back in the same time frame and the provider may be waiting for multiple results in order to interpret others.  Please give us 48 hours in order for your provider to thoroughly review all the results before contacting the office for clarification of your results.  ++++++++++++++++++++++++++  Vit D  & Vit C 1,000 mg   are recommended to help protect  against the Covid-19 and other Corona viruses.    Also it's recommended  to take  Zinc 50 mg  to help  protect against the Covid-19   and best place to get  is also on [employer]  and don't pay more than 6-8 cents /pill !   +++++++++++++++++++++++++++++++++++++++ Recommend Adult Low Dose Aspirin or  coated  Aspirin 81 mg daily  To reduce risk of Colon Cancer 40 %,  Skin Cancer 26 % ,  Melanoma 46%  and  Pancreatic cancer 60% +++++++++++++++++++++++++++++++++++++++++ Vitamin D goal  is between 70-100.  Please make sure that you are taking your Vitamin D as directed.  It is very important as a natural anti-inflammatory  helping hair, skin, and nails, as well as reducing stroke and heart attack risk.  It helps your bones and helps with mood. It also decreases numerous cancer risks so please take it as directed.  Low Vit D is associated with a 200-300% higher risk for CANCER  and 200-300% higher risk for HEART   ATTACK  &  STROKE.   ...................................... It is also associated with higher death rate at younger ages,  autoimmune diseases like Rheumatoid arthritis, Lupus, Multiple Sclerosis.    Also many other serious conditions, like depression, Alzheimer's Dementia, infertility, muscle aches, fatigue, fibromyalgia - just to name  a few. +++++++++++++++++++++++++++++++++++++++++ Recommend the book "The END of DIETING" by Dr Joel Fuhrman  & the book "The END of DIABETES " by Dr Joel Fuhrman At [employer] - get book & Audio CD's    Being diabetic has a  300% increased risk for heart attack, stroke, cancer, and alzheimer- type vascular dementia. It is very important that you work harder with diet by avoiding all foods that are white. Avoid white rice (Tye & wild rice is OK), white potatoes (sweetpotatoes in moderation is OK), White bread or wheat bread or anything made out of white flour like bagels, donuts, rolls, buns, biscuits, cakes, pastries, cookies, pizza crust, and pasta (made from white flour & egg whites) - vegetarian pasta or spinach or wheat pasta is OK. Multigrain breads like Arnold's or Pepperidge Farm, or multigrain sandwich thins or flatbreads.  Diet, exercise and weight loss can reverse and cure diabetes in the early stages.  Diet, exercise and weight loss is very important in the control and prevention of complications of diabetes which affects every system in your body, ie. Brain - dementia/stroke, eyes - glaucoma/blindness, heart - heart attack/heart failure, kidneys - dialysis, stomach - gastric paralysis, intestines - malabsorption, nerves - severe painful neuritis, circulation - gangrene & loss of a leg(s), and finally cancer and Alzheimers.    I recommend avoid fried & greasy foods,  sweets/candy, white rice ([place] or wild rice or Quinoa is OK), white potatoes (sweet potatoes are OK) - anything   made from white flour - bagels, doughnuts, rolls, buns, biscuits,white and wheat breads, pizza crust and traditional pasta made of white flour & egg white(vegetarian pasta or spinach or wheat pasta is OK).  Multi-grain bread is OK - like multi-grain flat bread or sandwich thins. Avoid alcohol in excess. Exercise is also important.    Eat all the vegetables you want - avoid meat, especially red meat and dairy - especially  cheese.  Cheese is the most concentrated form of trans-fats which is the worst thing to clog up our arteries. Veggie cheese is OK which can be found in the fresh produce section at [employer] or [employer] or [employer]  +++++++++++++++++++++++++++++++++++++++ DASH Eating Plan  DASH stands for "Dietary Approaches to Stop Hypertension."   The DASH eating plan is a healthy eating plan that has been shown to reduce high blood pressure (hypertension). Additional health benefits may include reducing the risk of type 2 diabetes mellitus, heart disease, and stroke. The DASH eating plan may also help with weight loss. WHAT DO I NEED TO KNOW ABOUT THE DASH EATING PLAN? For the DASH eating plan, you will follow these general guidelines: Choose foods with a percent daily value for sodium of less than 5% (as listed on the food label). Use salt-free seasonings or herbs instead of table salt or sea salt. Check with your health care provider or pharmacist before using salt substitutes. Eat lower-sodium products, often labeled as "lower sodium" or "no salt added." Eat fresh foods. Eat more vegetables, fruits, and low-fat dairy products. Choose whole grains. Look for the word "whole" as the first word in the ingredient list. Choose fish  Limit sweets, desserts, sugars, and sugary drinks. Choose heart-healthy fats. Eat veggie cheese  Eat more home-cooked food and less restaurant, buffet, and fast food. Limit fried foods. Cook foods using methods other than frying. Limit canned vegetables. If you do use them, rinse them well to decrease the sodium. When eating at a restaurant, ask that your food be prepared with less salt, or no salt if possible.                      WHAT FOODS CAN I EAT? Read Dr Joel Fuhrman's books on The End of Dieting & The End of Diabetes  Grains Whole grain or whole wheat bread. [place] rice. Whole grain or whole wheat pasta. Quinoa, bulgur, and whole grain cereals. Low-sodium  cereals. Corn or whole wheat flour tortillas. Whole grain cornbread. Whole grain crackers. Low-sodium crackers.  Vegetables Fresh or frozen vegetables (raw, steamed, roasted, or grilled). Low-sodium or reduced-sodium tomato and vegetable juices. Low-sodium or reduced-sodium tomato sauce and paste. Low-sodium or reduced-sodium canned vegetables.   Fruits All fresh, canned (in natural juice), or frozen fruits.  Protein Products  All fish and seafood.  Dried beans, peas, or lentils. Unsalted nuts and seeds. Unsalted canned beans.  Dairy Low-fat dairy products, such as skim or 1% milk, 2% or reduced-fat cheeses, low-fat ricotta or cottage cheese, or plain low-fat yogurt. Low-sodium or reduced-sodium cheeses.  Fats and Oils Tub margarines without trans fats. Light or reduced-fat mayonnaise and salad dressings (reduced sodium). Avocado. Safflower, olive, or canola oils. Natural peanut or almond butter.  Other Unsalted popcorn and pretzels. The items listed above may not be a complete list of recommended foods or beverages. Contact your dietitian for more options.  +++++++++++++++  WHAT FOODS ARE NOT RECOMMENDED? Grains/ White flour or wheat flour White bread. White pasta. White rice. Refined   cornbread. Bagels and croissants. Crackers that contain trans fat.  Vegetables  Creamed or fried vegetables. Vegetables in a . Regular canned vegetables. Regular canned tomato sauce and paste. Regular tomato and vegetable juices.  Fruits Dried fruits. Canned fruit in light or heavy syrup. Fruit juice.  Meat and Other Protein Products Meat in general - RED meat & White meat.  Fatty cuts of meat. Ribs, chicken wings, all processed meats as bacon, sausage, bologna, salami, fatback, hot dogs, bratwurst and packaged luncheon meats.  Dairy Whole or 2% milk, cream, half-and-half, and cream cheese. Whole-fat or sweetened yogurt. Full-fat cheeses or blue cheese. Non-dairy creamers and whipped toppings.  Processed cheese, cheese spreads, or cheese curds.  Condiments Onion and garlic salt, seasoned salt, table salt, and sea salt. Canned and packaged gravies. Worcestershire sauce. Tartar sauce. Barbecue sauce. Teriyaki sauce. Soy sauce, including reduced sodium. Steak sauce. Fish sauce. Oyster sauce. Cocktail sauce. Horseradish. Ketchup and mustard. Meat flavorings and tenderizers. Bouillon cubes. Hot sauce. Tabasco sauce. Marinades. Taco seasonings. Relishes.  Fats and Oils Butter, stick margarine, lard, shortening and bacon fat. Coconut, palm kernel, or palm oils. Regular salad dressings.  Pickles and olives. Salted popcorn and pretzels.  The items listed above may not be a complete list of foods and beverages to avoid.  

## 2022-08-12 LAB — CBC WITH DIFFERENTIAL/PLATELET
Absolute Monocytes: 240 cells/uL (ref 200–950)
Basophils Absolute: 29 cells/uL (ref 0–200)
Basophils Relative: 0.6 %
Eosinophils Absolute: 38 cells/uL (ref 15–500)
Eosinophils Relative: 0.8 %
HCT: 35.8 % (ref 35.0–45.0)
Hemoglobin: 11.8 g/dL (ref 11.7–15.5)
Lymphs Abs: 902 cells/uL (ref 850–3900)
MCH: 28.9 pg (ref 27.0–33.0)
MCHC: 33 g/dL (ref 32.0–36.0)
MCV: 87.7 fL (ref 80.0–100.0)
MPV: 10.8 fL (ref 7.5–12.5)
Monocytes Relative: 5 %
Neutro Abs: 3590 cells/uL (ref 1500–7800)
Neutrophils Relative %: 74.8 %
Platelets: 214 10*3/uL (ref 140–400)
RBC: 4.08 10*6/uL (ref 3.80–5.10)
RDW: 13.5 % (ref 11.0–15.0)
Total Lymphocyte: 18.8 %
WBC: 4.8 10*3/uL (ref 3.8–10.8)

## 2022-08-12 LAB — COMPLETE METABOLIC PANEL WITH GFR
AG Ratio: 1.8 (calc) (ref 1.0–2.5)
ALT: 10 U/L (ref 6–29)
AST: 14 U/L (ref 10–35)
Albumin: 4.4 g/dL (ref 3.6–5.1)
Alkaline phosphatase (APISO): 64 U/L (ref 37–153)
BUN: 17 mg/dL (ref 7–25)
CO2: 29 mmol/L (ref 20–32)
Calcium: 10.2 mg/dL (ref 8.6–10.4)
Chloride: 107 mmol/L (ref 98–110)
Creat: 0.72 mg/dL (ref 0.60–0.95)
Globulin: 2.5 g/dL (calc) (ref 1.9–3.7)
Glucose, Bld: 112 mg/dL — ABNORMAL HIGH (ref 65–99)
Potassium: 4.5 mmol/L (ref 3.5–5.3)
Sodium: 144 mmol/L (ref 135–146)
Total Bilirubin: 0.5 mg/dL (ref 0.2–1.2)
Total Protein: 6.9 g/dL (ref 6.1–8.1)
eGFR: 83 mL/min/{1.73_m2} (ref >=60)

## 2022-08-12 LAB — LIPID PANEL
Cholesterol: 181 mg/dL (ref <200)
HDL: 61 mg/dL (ref >=50)
LDL Cholesterol (Calc): 96 mg/dL (calc)
Non-HDL Cholesterol (Calc): 120 mg/dL (calc) (ref <130)
Total CHOL/HDL Ratio: 3 (calc) (ref <5.0)
Triglycerides: 137 mg/dL (ref <150)

## 2022-08-12 LAB — INSULIN, RANDOM: Insulin: 17.3 u[IU]/mL

## 2022-08-12 LAB — HEMOGLOBIN A1C
Hgb A1c MFr Bld: 5.6 % of total Hgb (ref <5.7)
Mean Plasma Glucose: 114 mg/dL
eAG (mmol/L): 6.3 mmol/L

## 2022-08-12 LAB — MAGNESIUM: Magnesium: 2.1 mg/dL (ref 1.5–2.5)

## 2022-08-12 LAB — TSH: TSH: 2.05 mIU/L (ref 0.40–4.50)

## 2022-08-12 LAB — VITAMIN D 25 HYDROXY (VIT D DEFICIENCY, FRACTURES): Vit D, 25-Hydroxy: 63 ng/mL (ref 30–100)

## 2022-08-13 NOTE — Progress Notes (Signed)
<><><><><><><><><><><><><><><><><><><><><><><><><><><><><><><><><> <><><><><><><><><><><><><><><><><><><><><><><><><><><><><><><><><>  -   Chol =181  Excellent   - Very low risk for Heart Attack  / Stroke <><><><><><><><><><><><><><><><><><><><><><><><><><><><><><><><><>  -    A1c - back   to Normal  <><><><><><><><><><><><><><><><><><><><><><><><><><><><><><><><><> <><><><><><><><><><><><><><><><><><><><><><><><><><><><><><><><><>  -  Vitamin D= 63   Excellent   - Very low risk for Heart Attack  / Stroke <><><><><><><><><><><><><><><><><><><><><><><><><><><><><><><><><>  -   Vit D = 63  - great   -  Please keep dosage same  <><><><><><><><><><><><><><><><><><><><><><><><><><><><><><><><><>  - All Else - CBC - Kidneys - Electrolytes - Liver - Magnesium & Thyroid    - all  Normal / OK <><><><><><><><><><><><><><><><><><><><><><><><><><><><><><><><><> <><><><><><><><><><><><><><><><><><><><><><><><><><><><><><><><><>

## 2022-08-14 MED ORDER — MECLIZINE HCL 25 MG PO TABS: ORAL_TABLET | ORAL | 3 refills | Status: AC

## 2022-09-08 ENCOUNTER — Inpatient Hospital Stay: Payer: PPO | Attending: Oncology | Admitting: Oncology

## 2022-09-08 DIAGNOSIS — I10 Essential (primary) hypertension: Secondary | ICD-10-CM | POA: Insufficient documentation

## 2022-09-08 DIAGNOSIS — R21 Rash and other nonspecific skin eruption: Secondary | ICD-10-CM | POA: Insufficient documentation

## 2022-09-08 DIAGNOSIS — Z170 Estrogen receptor positive status [ER+]: Secondary | ICD-10-CM | POA: Insufficient documentation

## 2022-09-08 DIAGNOSIS — Z800 Family history of malignant neoplasm of digestive organs: Secondary | ICD-10-CM | POA: Insufficient documentation

## 2022-09-08 DIAGNOSIS — C50811 Malignant neoplasm of overlapping sites of right female breast: Secondary | ICD-10-CM | POA: Insufficient documentation

## 2022-09-08 DIAGNOSIS — Z08 Encounter for follow-up examination after completed treatment for malignant neoplasm: Secondary | ICD-10-CM | POA: Insufficient documentation

## 2022-09-08 DIAGNOSIS — F32A Depression, unspecified: Secondary | ICD-10-CM | POA: Insufficient documentation

## 2022-09-08 DIAGNOSIS — Z79811 Long term (current) use of aromatase inhibitors: Secondary | ICD-10-CM | POA: Insufficient documentation

## 2022-09-08 DIAGNOSIS — Z85038 Personal history of other malignant neoplasm of large intestine: Secondary | ICD-10-CM | POA: Insufficient documentation

## 2022-09-12 ENCOUNTER — Other Ambulatory Visit (HOSPITAL_BASED_OUTPATIENT_CLINIC_OR_DEPARTMENT_OTHER): Payer: Self-pay

## 2022-09-12 ENCOUNTER — Inpatient Hospital Stay: Payer: PPO | Admitting: Oncology

## 2022-09-12 VITALS — BP 140/67 | HR 84 | Temp 98.2°F | Resp 18 | Ht 65.0 in | Wt 134.8 lb

## 2022-09-12 DIAGNOSIS — I10 Essential (primary) hypertension: Secondary | ICD-10-CM | POA: Diagnosis not present

## 2022-09-12 DIAGNOSIS — Z08 Encounter for follow-up examination after completed treatment for malignant neoplasm: Secondary | ICD-10-CM | POA: Diagnosis not present

## 2022-09-12 DIAGNOSIS — F32A Depression, unspecified: Secondary | ICD-10-CM | POA: Diagnosis not present

## 2022-09-12 DIAGNOSIS — Z85038 Personal history of other malignant neoplasm of large intestine: Secondary | ICD-10-CM | POA: Diagnosis not present

## 2022-09-12 DIAGNOSIS — C50311 Malignant neoplasm of lower-inner quadrant of right female breast: Secondary | ICD-10-CM | POA: Diagnosis not present

## 2022-09-12 DIAGNOSIS — Z170 Estrogen receptor positive status [ER+]: Secondary | ICD-10-CM | POA: Diagnosis not present

## 2022-09-12 DIAGNOSIS — C50811 Malignant neoplasm of overlapping sites of right female breast: Secondary | ICD-10-CM | POA: Diagnosis not present

## 2022-09-12 DIAGNOSIS — R21 Rash and other nonspecific skin eruption: Secondary | ICD-10-CM | POA: Diagnosis not present

## 2022-09-12 DIAGNOSIS — Z800 Family history of malignant neoplasm of digestive organs: Secondary | ICD-10-CM | POA: Diagnosis not present

## 2022-09-12 DIAGNOSIS — Z79811 Long term (current) use of aromatase inhibitors: Secondary | ICD-10-CM | POA: Diagnosis not present

## 2022-09-12 MED ORDER — ANASTROZOLE 1 MG PO TABS: 1.0000 mg | ORAL_TABLET | Freq: Every day | ORAL | 1 refills | Status: DC

## 2022-09-12 NOTE — Progress Notes (Signed)
  [facility] OFFICE PROGRESS NOTE   Diagnosis: Breast cancer  INTERVAL HISTORY:   Ms. Acebedo completed right breast radiation beginning 05/31/2022 and completed 06/30/2022.  She began anastrozole 07/01/2022.  No hot flashes or arthralgias.  She has intermittent "cramping "in her hands.  She has noted a pruritic rash over the anterior chest for the past 2 weeks.  Objective:  Vital signs in last 24 hours:  Blood pressure (!) 140/67, pulse 84, temperature 98.2 F (36.8 C), temperature source Oral, resp. rate 18, height 5\' 5"  (1.651 m), weight 134 lb 12.8 oz (61.1 kg), SpO2 100 %.    Lymphatics: No cervical, supraclavicular, or axillary nodes Resp: Lungs clear bilaterally Cardio: Regular rate and rhythm GI: No hepatosplenomegaly Vascular: No leg edema Skin: Erythematous fine raised rash over the anterior chest bilaterally.  No discrete cutaneous nodules   Lab Results:  Lab Results  Component Value Date   WBC 4.8 08/11/2022   HGB 11.8 08/11/2022   HCT 35.8 08/11/2022   MCV 87.7 08/11/2022   PLT 214 08/11/2022   NEUTROABS 3,590 08/11/2022    CMP  Lab Results  Component Value Date   NA 144 08/11/2022   K 4.5 08/11/2022   CL 107 08/11/2022   CO2 29 08/11/2022   GLUCOSE 112 (H) 08/11/2022   BUN 17 08/11/2022   CREATININE 0.72 08/11/2022   CALCIUM 10.2 08/11/2022   PROT 6.9 08/11/2022   ALBUMIN 3.7 07/10/2022   AST 14 08/11/2022   ALT 10 08/11/2022   ALKPHOS 65 07/10/2022   BILITOT 0.5 08/11/2022   GFRNONAA >60 07/10/2022   GFRAA 85 07/27/2020    Medications: I have reviewed the patient's current medications.   Assessment/Plan:  Breast cancer-synchronous stage I breast cancers Mammogram/ultrasound 03/10/2022-masses at the 3:00 (1.6 m) and 11 o'clock (0.9 cm) positions of the right breast Ultrasound-guided biopsy of 3:00 and 11:00 breast masses 03/24/2022-3:00 mass revealed moderately differentiated invasive ductal adenocarcinoma, grade 2,  intermediate grade DCIS without necrosis, 11:00 biopsy-invasive lobular carcinoma, grade 2 Radio guided seed lumpectomies 04/07/2022 -3:00 invasive ductal carcinoma, grade 2, 15 x 12 x 10 mm, associated DCIS with focal necrosis, negative margins, 3 mm anterior margin ER 100%, PR 1%, HER2 negative, Ki-67 5% pT1c, PN X -11:00 of lobular carcinoma, grade 2, 11x10 x 10 mm, negative margins, 4 mm medial and superior margins, ER 95%, PR 20%, HER2 negative, Ki-67 2% pT1c, PN X Right breast radiation 05/31/2022 - 06/30/2022 Anastrozole 07/01/2022 Right breast erythema and edema 05/11/2022-prescribed course of doxycycline Pituitary adenoma 2013 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 Darensbourg is in clinical remission from breast cancer.  She is tolerating the anastrozole well.  The pleuritic rash at the anterior chest is most likely unrelated to anastrozole or breast cancer.  She will follow-up with her primary provider if the rash does not improve over the next few weeks.  Ms. Monty will return for an office visit in 6 months.  She will continue yearly mammography.  Thornton Papas, MD  09/12/2022  3:19 PM

## 2022-10-18 DIAGNOSIS — Z9181 History of falling: Secondary | ICD-10-CM | POA: Diagnosis not present

## 2022-10-18 DIAGNOSIS — F419 Anxiety disorder, unspecified: Secondary | ICD-10-CM | POA: Diagnosis not present

## 2022-10-18 DIAGNOSIS — M1990 Unspecified osteoarthritis, unspecified site: Secondary | ICD-10-CM | POA: Diagnosis not present

## 2022-10-18 DIAGNOSIS — C50919 Malignant neoplasm of unspecified site of unspecified female breast: Secondary | ICD-10-CM | POA: Diagnosis not present

## 2022-10-18 DIAGNOSIS — Z79811 Long term (current) use of aromatase inhibitors: Secondary | ICD-10-CM | POA: Diagnosis not present

## 2022-10-18 DIAGNOSIS — R42 Dizziness and giddiness: Secondary | ICD-10-CM | POA: Diagnosis not present

## 2022-10-18 DIAGNOSIS — Z008 Encounter for other general examination: Secondary | ICD-10-CM | POA: Diagnosis not present

## 2022-10-18 DIAGNOSIS — I10 Essential (primary) hypertension: Secondary | ICD-10-CM | POA: Diagnosis not present

## 2022-11-23 ENCOUNTER — Ambulatory Visit: Payer: Medicare HMO | Admitting: Nurse Practitioner

## 2022-11-29 ENCOUNTER — Encounter: Payer: Self-pay | Admitting: Nurse Practitioner

## 2022-11-29 ENCOUNTER — Ambulatory Visit (INDEPENDENT_AMBULATORY_CARE_PROVIDER_SITE_OTHER): Payer: Medicare HMO | Admitting: Nurse Practitioner

## 2022-11-29 VITALS — BP 140/60 | HR 84 | Temp 97.6°F | Ht 65.0 in | Wt 134.8 lb

## 2022-11-29 DIAGNOSIS — Z0000 Encounter for general adult medical examination without abnormal findings: Secondary | ICD-10-CM

## 2022-11-29 DIAGNOSIS — K219 Gastro-esophageal reflux disease without esophagitis: Secondary | ICD-10-CM

## 2022-11-29 DIAGNOSIS — R7303 Prediabetes: Secondary | ICD-10-CM | POA: Diagnosis not present

## 2022-11-29 DIAGNOSIS — Z6822 Body mass index (BMI) 22.0-22.9, adult: Secondary | ICD-10-CM

## 2022-11-29 DIAGNOSIS — E782 Mixed hyperlipidemia: Secondary | ICD-10-CM | POA: Diagnosis not present

## 2022-11-29 DIAGNOSIS — M8589 Other specified disorders of bone density and structure, multiple sites: Secondary | ICD-10-CM

## 2022-11-29 DIAGNOSIS — R001 Bradycardia, unspecified: Secondary | ICD-10-CM

## 2022-11-29 DIAGNOSIS — I10 Essential (primary) hypertension: Secondary | ICD-10-CM | POA: Diagnosis not present

## 2022-11-29 DIAGNOSIS — R6889 Other general symptoms and signs: Secondary | ICD-10-CM | POA: Diagnosis not present

## 2022-11-29 DIAGNOSIS — I471 Supraventricular tachycardia, unspecified: Secondary | ICD-10-CM

## 2022-11-29 DIAGNOSIS — G629 Polyneuropathy, unspecified: Secondary | ICD-10-CM | POA: Diagnosis not present

## 2022-11-29 DIAGNOSIS — F325 Major depressive disorder, single episode, in full remission: Secondary | ICD-10-CM

## 2022-11-29 DIAGNOSIS — Z0001 Encounter for general adult medical examination with abnormal findings: Secondary | ICD-10-CM

## 2022-11-29 DIAGNOSIS — E559 Vitamin D deficiency, unspecified: Secondary | ICD-10-CM | POA: Diagnosis not present

## 2022-11-29 DIAGNOSIS — Z79899 Other long term (current) drug therapy: Secondary | ICD-10-CM | POA: Diagnosis not present

## 2022-11-29 DIAGNOSIS — D497 Neoplasm of unspecified behavior of endocrine glands and other parts of nervous system: Secondary | ICD-10-CM | POA: Diagnosis not present

## 2022-11-29 NOTE — Patient Instructions (Signed)

## 2022-11-29 NOTE — Progress Notes (Signed)
MEDICARE ANNUAL WELLNESS VISIT AND FOLLOW UP Assessment:   Encounter for Medicare annual wellness exam Due annually Health maintenance reviewed  SVT (supraventricular tachycardia) (HCC) No known recurrence, continue BB; monitor   Hypertension Elevated in clinic Discussed DASH (Dietary Approaches to Stop Hypertension) DASH diet is lower in sodium than a typical American diet. Cut back on foods that are high in saturated fat, cholesterol, and trans fats. Eat more whole-grain foods, fish, poultry, and nuts Remain active and exercise as tolerated daily.  Monitor BP at home-Call if greater than 130/80.  Check CMP/CBC  Gastroesophageal reflux disease, esophagitis presence not specified No suspected reflux complications (Barret/stricture). Lifestyle modification:  wt loss, avoid meals 2-3h before bedtime. Consider eliminating food triggers:  chocolate, caffeine, EtOH, acid/spicy food.  Pituitary tumor (excised 2013) S/p excision, doing well   Vitamin D deficiency Continue supplement for goal of 60-100 Monitor Vitamin D levels  Prediabetes Education: Reviewed 'ABCs' of diabetes management  Discussed goals to be met and/or maintained include A1C (<7) Blood pressure (<130/80) Cholesterol (LDL <70) Continue Eye Exam yearly  Continue Dental Exam Q6 mo Discussed dietary recommendations Discussed Physical Activity recommendations Check A1C   BMI 22 Discussed appropriate BMI Diet modification. Physical activity. Encouraged/praised to build confidence.  Medication management All medications discussed and reviewed in full. All questions and concerns regarding medications addressed.     Major depression in remission (HCC) Continue medications  Lifestyle discussed: diet/exerise, sleep hygiene, stress management, hydration  Hyperlipidemia Discussed lifestyle modifications. Recommended diet heavy in fruits and veggies, omega 3's. Decrease consumption of animal meats, cheeses,  and dairy products. Remain active and exercise as tolerated. Continue to monitor. Check lipids/TSH  History of colon cancer, stage II Colonoscopy 06/2018; has been released  Bradycardia Monitor for fatigue, dizziness  Peripheral PolyNeuropathy in left foot Gelsemium [place] homeopathic medication effective Continue to monitor  Osteopenia DEXA due - ordered  Pursue a combination of weight-bearing exercises and strength training. Patients with severe mobility impairment should be referred for physical therapy. Advised on fall prevention measures including proper lighting in all rooms, removal of area rugs and floor clutter, use of walking devices as deemed appropriate, avoidance of uneven walking surfaces. Smoking cessation and moderate alcohol consumption if applicable Consume 800 to 1000 IU of vitamin D daily with a goal vitamin D serum value of 30 ng/mL or higher. Aim for 1000 to 1200 mg of elemental calcium daily through supplements and/or dietary sources.  Orders Placed This Encounter  Procedures   DG Bone Density    Standing Status:   Future    Standing Expiration Date:   11/29/2023    Order Specific Question:   Reason for Exam (SYMPTOM  OR DIAGNOSIS REQUIRED)    Answer:   Osteopenia screening    Order Specific Question:   Preferred imaging location?    Answer:   [facility]   CBC with Differential/Platelet   COMPLETE METABOLIC PANEL WITH GFR   Lipid panel   Hemoglobin A1c   VITAMIN D 25 Hydroxy (Vit-D Deficiency, Fractures)   Notify office for further evaluation and treatment, questions or concerns if any reported s/s fail to improve.   The patient was advised to call back or seek an in-person evaluation if any symptoms worsen or if the condition fails to improve as anticipated.   Further disposition pending results of labs. Discussed med's effects and SE's.    I discussed the assessment and treatment plan with the patient. The patient was provided an  opportunity to ask  questions and all were answered. The patient agreed with the plan and demonstrated an understanding of the instructions.  Discussed med's effects and SE's. Screening labs and tests as requested with regular follow-up as recommended.  I provided 35 minutes of face-to-face time during this encounter including counseling, chart review, and critical decision making was preformed.  Today's Plan of Care is based on a patient-centered health care approach known as shared decision making - the decisions, tests and treatments allow for patient preferences and values to be balanced with clinical evidence.    Future Appointments  Date Time Provider Department Center  03/13/2023 11:40 AM Ladene Artist, MD [facility] None  03/23/2023  2:00 PM Lucky Cowboy, MD [facility] None  11/23/2023  3:30 PM Adela Glimpse, NP [facility] None     Plan:   During the course of the visit the patient was educated and counseled about appropriate screening and preventive services including:   Pneumococcal vaccine  Influenza vaccine Prevnar 13 Td vaccine Screening electrocardiogram Colorectal cancer screening Diabetes screening Glaucoma screening Nutrition counseling    Subjective:  Kristen [name] is a 82 y.o. female who presents for Medicare Annual Wellness Visit and 3 month follow up for HTN, hyperlipidemia, prediabetes, and vitamin D Def.   Overall she reports feeling well today.  She has no new concerns at this time.  In 2013, patient underwent resection of a pituitary tumor and has done well since. No longer follows up with endocrinology   Does have burred vision, has not seen eye doctor in over 5 years, knows she needs to schedule.  She doesn't have car or license, her friend/neighbor is available to drive when needed.   She has a history of colon cancer over 10 years ago, resolved with resection, had recent colonoscopy in 06/2018. Has been advised no further colonoscopies.    BMI is Body mass index is 22.43 kg/m., she has been working on diet and working outdoors in the garden. Wt Readings from Last 3 Encounters:  11/29/22 134 lb 12.8 oz (61.1 kg)  09/12/22 134 lb 12.8 oz (61.1 kg)  08/11/22 132 lb 9.6 oz (60.1 kg)   She has hx of SVT in 2013. Her blood pressure has been controlled at home, today their BP is BP: (!) 140/60  BP Readings from Last 3 Encounters:  11/29/22 (!) 140/60  09/12/22 (!) 140/67  08/11/22 136/72       She does not workout. She denies chest pain, shortness of breath, she does endorse some episodes of dizziness with sudden positions.   Atorvastatin 40 mg daily She is on cholesterol medication and denies myalgias. Her cholesterol is not at goal. The cholesterol last visit was:   Lab Results  Component Value Date   CHOL 181 08/11/2022   HDL 61 08/11/2022   LDLCALC 96 08/11/2022   TRIG 137 08/11/2022   CHOLHDL 3.0 08/11/2022   Historically Kristen [name] has had well controlled prediabetes secondary to diet and exercise Lab Results  Component Value Date   HGBA1C 5.6 08/11/2022   Last GFR Lab Results  Component Value Date   GFRNONAA >60 07/10/2022   Patient is on Vitamin D supplement.   Lab Results  Component Value Date   VD25OH 63 08/11/2022      Medication Review: Current Outpatient Medications on File Prior to Visit  Medication Sig Dispense Refill   amLODipine (NORVASC) 2.5 MG tablet Take  1 tablet  Daily for BP                                                             /  Take                                 by                                  mouth                                  once daily 90 tablet 3   anastrozole (ARIMIDEX) 1 MG tablet Take 1 tablet (1 mg total) by mouth daily. 90 tablet 1   bisoprolol-hydrochlorothiazide (ZIAC) 2.5-6.25 MG tablet Take  1 tablet  Daily   for BP                                                                        /                                              Take                              by                        mouth                                once daily 90 tablet 3   Cholecalciferol 50 MCG (2000 UT) CHEW Chew 6,000 Units by mouth daily. Gummies     meclizine (ANTIVERT) 25 MG tablet Take 1 tablet 2 to 3 x /day as needed for Vertigo 270 tablet 3   naproxen (NAPROSYN) 375 MG tablet Take 1 tablet (375 mg total) by mouth 2 (two) times daily. Take with food 20 tablet 0   olmesartan (BENICAR) 40 MG tablet Take  1 tablet  at Night  for BP                                                            /                                                     TAKE                                           BY  MOUTH 90 tablet 3   PARoxetine (PAXIL) 30 MG tablet Take  1 tablet Daily for Mood                                        /                                   TAKE                                                BY                                  MOUTH                                    ONCE DAILY 90 tablet 3   traMADol (ULTRAM) 50 MG tablet Take 1 tablet (50 mg total) by mouth every 6 (six) hours as needed. 12 tablet 0   No current facility-administered medications on file prior to visit.    Allergies: Allergies  Allergen Reactions   Aspirin Other (See Comments)    High Doses stomach trouble   Codeine Nausea And Vomiting   Fenofibrate Cough   Gabapentin Other (See Comments)    Drowsiness   Hydrochlorothiazide Other (See Comments)    Fatigue   Metoclopramide Other (See Comments)    Sedation    Current Problems (verified) has SVT (supraventricular tachycardia); GERD (gastroesophageal reflux disease); Major depression in remission (HCC); Overweight (BMI 25.0-29.9); History of colon cancer, stage II; Vitamin D deficiency; Prediabetes; Hyperlipidemia, mixed; Medication management; Pituitary tumor (excised 2013); Essential hypertension; Abnormal glucose; Bradycardia; Osteopenia; and Breast  cancer (HCC) on their problem list.  Screening Tests Immunization History  Administered Date(s) Administered   Fluad Quad(high Dose 65+) 05/11/2022   Influenza Split 04/19/2012   Influenza, High Dose Seasonal PF 04/17/2013, 03/12/2014, 03/03/2017, 04/12/2018, 04/17/2019, 04/20/2020, 06/08/2021   Pneumococcal Conjugate-13 09/30/2014   Pneumococcal Polysaccharide-23 04/19/2012   Td 07/20/2005, 12/01/2015    Preventative care: DEXA: 11/2020 osteopenia - due  Mammogram:  2015 - does not wish to pursue further Last colonoscopy: 2015 - 06/2018 - DONE  Prior vaccinations: TD or Tdap: 2017  Influenza: 06/08/2021  Pneumococcal: 2013 Prevnar13: 2016  Covid 19: declines, just had covid 19 in Oct  Names of Other Physician/Practitioners you currently use: 1. [facility] here for primary care 2. Dr. Nile Riggs, eye doctor, last visit 2018, needs to schedule follow up  3. Dr. Lamont Dowdy, dentist, last visit 2023, have partial   Patient Care Team: Lucky Cowboy, MD as PCP - General (Internal Medicine) Luciana Axe Alford Highland, MD as Consulting Physician (Ophthalmology) Charlett Nose, [facility] (Inactive) as Pharmacist (Pharmacist)  Surgical: She  has a past surgical history that includes Colon resection (1988); Colonoscopy (09/20/2011); Hot hemostasis (09/20/2011); Cataract extraction (June/july 2013); Colonoscopy (12/15/2011); Colonoscopy (01/26/2012); Eye surgery; Tumor removal; Craniotomy (04/18/2012); Transnasal approach (04/18/2012); Laparoscopic right hemi colectomy (06/27/2012); Cystoscopy with ureteroscopy and stent placement (Bilateral, 10/01/2015); Back surgery; Colon surgery (1990's); Cystoscopy with ureteroscopy and stent placement (  Right, 10/19/2015); Holmium laser application (Right, 10/19/2015); and Breast lumpectomy with radioactive seed localization (Right, 04/07/2022). Family Her family history includes Brain cancer in her cousin; Breast cancer in her sister; Cancer in  her maternal aunt; Colon cancer in her cousin; Colon cancer (age of onset: 31) in an other family member; Colon cancer (age of onset: 61) in her sister; Colon polyps in her son, son and another family member; Dementia in her sister; Heart disease in her father and mother; Liver cancer in her maternal uncle. Social history  She reports that she has never smoked. She has never used smokeless tobacco. She reports current alcohol use. She reports that she does not use drugs.  MEDICARE WELLNESS OBJECTIVES: Physical activity: Current Exercise Habits: Home exercise routine Cardiac risk factors:   Depression/mood screen:      11/29/2022    1:10 PM  Depression screen PHQ 2/9  Decreased Interest 0  Down, Depressed, Hopeless 0  PHQ - 2 Score 0    ADLs:     11/29/2022    1:10 PM 04/07/2022    1:27 PM  In your present state of health, do you have any difficulty performing the following activities:  Hearing? 0   Vision? 0   Difficulty concentrating or making decisions? 0   Walking or climbing stairs? 0   Dressing or bathing? 0   Doing errands, shopping? 1 0     Cognitive Testing  Alert? Yes  Normal Appearance?Yes  Oriented to person? Yes  Place? Yes   Time? Yes  Recall of three objects?  Yes  Can perform simple calculations? Yes  Displays appropriate judgment?Yes  Can read the correct time from a watch face?Yes  EOL planning: Does Patient Have a Medical Advance Directive?: No   Objective:   Today's Vitals   11/29/22 1040  BP: (!) 140/60  Pulse: 84  Temp: 97.6 F (36.4 C)  SpO2: 99%  Weight: 134 lb 12.8 oz (61.1 kg)  Height: 5\' 5"  (1.651 m)     Body mass index is 22.43 kg/m.  General appearance: alert, no distress, WD/WN, female HEENT: normocephalic, sclerae anicteric, TMs pearly, nares patent, no discharge or erythema, pharynx normal Oral cavity: MMM, no lesions Neck: supple, no lymphadenopathy, no thyromegaly, no masses Heart: RRR, normal S1, S2, no murmurs Lungs: CTA  bilaterally, no wheezes, rhonchi, or rales Abdomen: +bs, soft, non tender, non distended, no masses, no hepatomegaly, no splenomegaly Musculoskeletal: Cast present on the right ankle, nontender, no swelling, no obvious deformity Extremities: no edema, no cyanosis, no clubbing Pulses: 2+ symmetric, upper and lower extremities, normal cap refill Neurological: alert, oriented x 3, CN2-12 intact, strength normal upper extremities and lower extremities, sensation normal throughout, DTRs 2+ throughout, no cerebellar signs, gait normal Psychiatric: normal affect, behavior normal, pleasant   Medicare Attestation I have personally reviewed: The patient's medical and social history Their use of alcohol, tobacco or illicit drugs Their current medications and supplements The patient's functional ability including ADLs,fall risks, home safety risks, cognitive, and hearing and visual impairment Diet and physical activities Evidence for depression or mood disorders  The patient's weight, height, BMI, and visual acuity have been recorded in the chart.  I have made referrals, counseling, and provided education to the patient based on review of the above and I have provided the patient with a written personalized care plan for preventive services.     Adela Glimpse, NP   11/29/2022

## 2022-11-30 LAB — CBC WITH DIFFERENTIAL/PLATELET
Absolute Monocytes: 366 cells/uL (ref 200–950)
Basophils Absolute: 53 cells/uL (ref 0–200)
Basophils Relative: 0.9 %
Eosinophils Absolute: 153 cells/uL (ref 15–500)
Eosinophils Relative: 2.6 %
HCT: 36.5 % (ref 35.0–45.0)
Hemoglobin: 12.2 g/dL (ref 11.7–15.5)
Lymphs Abs: 1127 cells/uL (ref 850–3900)
MCH: 29.7 pg (ref 27.0–33.0)
MCHC: 33.4 g/dL (ref 32.0–36.0)
MCV: 88.8 fL (ref 80.0–100.0)
MPV: 10.8 fL (ref 7.5–12.5)
Monocytes Relative: 6.2 %
Neutro Abs: 4201 cells/uL (ref 1500–7800)
Neutrophils Relative %: 71.2 %
Platelets: 250 10*3/uL (ref 140–400)
RBC: 4.11 10*6/uL (ref 3.80–5.10)
RDW: 13.1 % (ref 11.0–15.0)
Total Lymphocyte: 19.1 %
WBC: 5.9 10*3/uL (ref 3.8–10.8)

## 2022-11-30 LAB — COMPLETE METABOLIC PANEL WITH GFR
AG Ratio: 1.9 (calc) (ref 1.0–2.5)
ALT: 12 U/L (ref 6–29)
AST: 14 U/L (ref 10–35)
Albumin: 4.3 g/dL (ref 3.6–5.1)
Alkaline phosphatase (APISO): 66 U/L (ref 37–153)
BUN/Creatinine Ratio: 29 (calc) — ABNORMAL HIGH (ref 6–22)
BUN: 16 mg/dL (ref 7–25)
CO2: 28 mmol/L (ref 20–32)
Calcium: 9.9 mg/dL (ref 8.6–10.4)
Chloride: 106 mmol/L (ref 98–110)
Creat: 0.56 mg/dL — ABNORMAL LOW (ref 0.60–0.95)
Globulin: 2.3 g/dL (calc) (ref 1.9–3.7)
Glucose, Bld: 98 mg/dL (ref 65–99)
Potassium: 4.9 mmol/L (ref 3.5–5.3)
Sodium: 142 mmol/L (ref 135–146)
Total Bilirubin: 0.6 mg/dL (ref 0.2–1.2)
Total Protein: 6.6 g/dL (ref 6.1–8.1)
eGFR: 91 mL/min/{1.73_m2} (ref >=60)

## 2022-11-30 LAB — HEMOGLOBIN A1C
Hgb A1c MFr Bld: 5.6 % of total Hgb (ref <5.7)
Mean Plasma Glucose: 114 mg/dL
eAG (mmol/L): 6.3 mmol/L

## 2022-11-30 LAB — LIPID PANEL
Cholesterol: 182 mg/dL (ref <200)
HDL: 56 mg/dL (ref >=50)
LDL Cholesterol (Calc): 107 mg/dL (calc) — ABNORMAL HIGH
Non-HDL Cholesterol (Calc): 126 mg/dL (calc) (ref <130)
Total CHOL/HDL Ratio: 3.3 (calc) (ref <5.0)
Triglycerides: 99 mg/dL (ref <150)

## 2022-11-30 LAB — VITAMIN D 25 HYDROXY (VIT D DEFICIENCY, FRACTURES): Vit D, 25-Hydroxy: 39 ng/mL (ref 30–100)

## 2022-12-30 ENCOUNTER — Other Ambulatory Visit: Payer: Self-pay | Admitting: Oncology

## 2023-01-03 ENCOUNTER — Telehealth: Payer: Self-pay | Admitting: *Deleted

## 2023-01-03 NOTE — Telephone Encounter (Signed)
Patient called to report she has been taking the anastrozole for ~ 1 1/2 months and it is making her feel "crazy". Says she is agitated and can't sleep or eat. Wants to stop medication. Instructed her to hold anastrozole for a week to see if she improves. She said she has not been taking any new medications.

## 2023-01-13 ENCOUNTER — Telehealth: Payer: Self-pay | Admitting: *Deleted

## 2023-01-13 NOTE — Telephone Encounter (Signed)
Called patient to determine how she was feeling since she stopped her anastrozole. Requested return call next week.

## 2023-01-23 ENCOUNTER — Telehealth: Payer: Self-pay | Admitting: *Deleted

## 2023-01-23 NOTE — Telephone Encounter (Signed)
Called patient to f/u on how she was doing since stopping the anastrozole. She would not talk with nurse. Grandson picked up phone and said she is out of control and needs something to calm her down. She is anxious and agitated and argumentative with everyone. Suggested he reach out to her PCP to get her seen asap-this behavior is not related to her cancer diagnosis or medication she was on. He understands and agrees.

## 2023-01-26 ENCOUNTER — Ambulatory Visit: Payer: Medicare HMO | Admitting: Nurse Practitioner

## 2023-01-30 ENCOUNTER — Encounter: Payer: Medicare HMO | Admitting: Internal Medicine

## 2023-01-31 ENCOUNTER — Ambulatory Visit: Payer: Medicare HMO | Admitting: Internal Medicine

## 2023-02-03 ENCOUNTER — Encounter: Payer: Medicare HMO | Admitting: Internal Medicine

## 2023-02-27 ENCOUNTER — Encounter (HOSPITAL_COMMUNITY): Payer: Self-pay | Admitting: Emergency Medicine

## 2023-02-27 ENCOUNTER — Emergency Department (HOSPITAL_COMMUNITY)
Admission: EM | Admit: 2023-02-27 | Discharge: 2023-02-28 | Disposition: A | Discharge status: Home / Self Care | Payer: Medicare HMO | Attending: Emergency Medicine | Admitting: Emergency Medicine

## 2023-02-27 ENCOUNTER — Emergency Department (HOSPITAL_COMMUNITY): Payer: Medicare HMO

## 2023-02-27 ENCOUNTER — Other Ambulatory Visit: Payer: Self-pay

## 2023-02-27 DIAGNOSIS — C7931 Secondary malignant neoplasm of brain: Secondary | ICD-10-CM | POA: Diagnosis not present

## 2023-02-27 DIAGNOSIS — D352 Benign neoplasm of pituitary gland: Secondary | ICD-10-CM | POA: Insufficient documentation

## 2023-02-27 DIAGNOSIS — R930 Abnormal findings on diagnostic imaging of skull and head, not elsewhere classified: Secondary | ICD-10-CM | POA: Diagnosis not present

## 2023-02-27 DIAGNOSIS — G939 Disorder of brain, unspecified: Secondary | ICD-10-CM | POA: Diagnosis not present

## 2023-02-27 DIAGNOSIS — Z853 Personal history of malignant neoplasm of breast: Secondary | ICD-10-CM | POA: Insufficient documentation

## 2023-02-27 DIAGNOSIS — C50919 Malignant neoplasm of unspecified site of unspecified female breast: Secondary | ICD-10-CM | POA: Diagnosis not present

## 2023-02-27 DIAGNOSIS — E236 Other disorders of pituitary gland: Secondary | ICD-10-CM | POA: Diagnosis not present

## 2023-02-27 DIAGNOSIS — G319 Degenerative disease of nervous system, unspecified: Secondary | ICD-10-CM | POA: Diagnosis not present

## 2023-02-27 LAB — COMPREHENSIVE METABOLIC PANEL
ALT: 13 U/L (ref 0–44)
AST: 17 U/L (ref 15–41)
Albumin: 3.9 g/dL (ref 3.5–5.0)
Alkaline Phosphatase: 69 U/L (ref 38–126)
Anion gap: 7 (ref 5–15)
BUN: 11 mg/dL (ref 8–23)
CO2: 25 mmol/L (ref 22–32)
Calcium: 9.1 mg/dL (ref 8.9–10.3)
Chloride: 106 mmol/L (ref 98–111)
Creatinine, Ser: 0.68 mg/dL (ref 0.44–1.00)
GFR, Estimated: >60 mL/min (ref >60)
Glucose, Bld: 88 mg/dL (ref 70–99)
Potassium: 3.6 mmol/L (ref 3.5–5.1)
Sodium: 138 mmol/L (ref 135–145)
Total Bilirubin: 0.6 mg/dL (ref 0.3–1.2)
Total Protein: 6.7 g/dL (ref 6.5–8.1)

## 2023-02-27 LAB — URINALYSIS, ROUTINE W REFLEX MICROSCOPIC
Bacteria, UA: NONE SEEN
Bilirubin Urine: NEGATIVE
Glucose, UA: NEGATIVE mg/dL
Hgb urine dipstick: NEGATIVE
Ketones, ur: NEGATIVE mg/dL
Nitrite: NEGATIVE
Protein, ur: NEGATIVE mg/dL
Specific Gravity, Urine: 1.006 (ref 1.005–1.030)
pH: 8 (ref 5.0–8.0)

## 2023-02-27 LAB — CBC
HCT: 38.1 % (ref 36.0–46.0)
Hemoglobin: 12.3 g/dL (ref 12.0–15.0)
MCH: 29.1 pg (ref 26.0–34.0)
MCHC: 32.3 g/dL (ref 30.0–36.0)
MCV: 90.1 fL (ref 80.0–100.0)
Platelets: 241 10*3/uL (ref 150–400)
RBC: 4.23 MIL/uL (ref 3.87–5.11)
RDW: 12.9 % (ref 11.5–15.5)
WBC: 5.1 10*3/uL (ref 4.0–10.5)
nRBC: 0 % (ref 0.0–0.2)

## 2023-02-27 LAB — CBG MONITORING, ED: Glucose-Capillary: 92 mg/dL (ref 70–99)

## 2023-02-27 MED ORDER — GADOBUTROL 1 MMOL/ML IV SOLN
6.0000 mL | Freq: Once | INTRAVENOUS | Status: AC | PRN
  Administered 2023-02-27: 6 mL via INTRAVENOUS

## 2023-02-27 NOTE — ED Provider Notes (Addendum)
[place] EMERGENCY DEPARTMENT AT [facility] Provider Note   CSN: [id number] Arrival date & time: 02/27/23  1333     History  Chief Complaint  Patient presents with   Altered Mental Status    Kristen [name] is a 82 y.o. female.  HPI 82 year old female presents with what she calls mood swings.  She states that she had breast mass surgery that was from breast cancer.  This was several months ago.  She was told to go on some medication that she cannot member the name.  However she started developing mood swings and agitation after this so she stopped the medication but the mood swings and agitation have not stopped.  She frequently yells at people which she states is not her normal.  She is also been depressed with some very rare fleeting suicidal thoughts but states she would never want to actually kill herself.  Last night EMS told her that she was having a panic attack as she was anxious after she had yelled at her grandson.  She states she has some slight on and off headaches but nothing particular.  No fevers or focal weakness or numbness.  She has a prior history of a brain surgery for some sort of mass that she is not sure what it was.  Home Medications Prior to Admission medications   Medication Sig Start Date End Date Taking? Authorizing Provider  amLODipine (NORVASC) 2.5 MG tablet Take  1 tablet  Daily for BP                                                             /                                                           Take                                 by                                  mouth                                  once daily 02/25/22   Lucky Cowboy, MD  anastrozole (ARIMIDEX) 1 MG tablet Take 1 tablet by mouth once daily 12/30/22   Ladene Artist, MD  bisoprolol-hydrochlorothiazide (ZIAC) 2.5-6.25 MG tablet Take  1 tablet  Daily   for BP                                                                        /  Take                              by                        mouth                                once daily 02/25/22   Lucky Cowboy, MD  Cholecalciferol 50 MCG (2000 UT) CHEW Chew 6,000 Units by mouth daily. Gummies    [provider]  meclizine (ANTIVERT) 25 MG tablet Take 1 tablet 2 to 3 x /day as needed for Vertigo 08/14/22   Lucky Cowboy, MD  naproxen (NAPROSYN) 375 MG tablet Take 1 tablet (375 mg total) by mouth 2 (two) times daily. Take with food 02/14/22   Triplett, Tammy, PA-C  olmesartan (BENICAR) 40 MG tablet Take  1 tablet  at Night  for BP                                                            /                                                     TAKE                                           BY                              MOUTH 02/25/22   Lucky Cowboy, MD  PARoxetine (PAXIL) 30 MG tablet Take  1 tablet Daily for Mood                                        /                                   TAKE                                                BY                                  MOUTH                                    ONCE DAILY 02/25/22   Lucky Cowboy, MD  traMADol (ULTRAM) 50 MG tablet Take 1 tablet (50 mg total) by mouth  every 6 (six) hours as needed. 02/14/22   Triplett, Tammy, PA-C      Allergies    Aspirin, Codeine, Fenofibrate, Gabapentin, Hydrochlorothiazide, and Metoclopramide    Review of Systems   Review of Systems  Cardiovascular:  Negative for chest pain.  Gastrointestinal:  Negative for abdominal pain.  Neurological:  Positive for headaches.  Psychiatric/Behavioral:  Positive for agitation and dysphoric mood. The patient is nervous/anxious.     Physical Exam Updated Vital Signs BP (!) 158/89   Pulse (!) 53   Temp 98.5 F (36.9 C) (Oral)   Resp 18   Ht 5\' 5"  (1.651 m)   Wt 62.1 kg   SpO2 100%   BMI 22.80 kg/m  Physical Exam Vitals and nursing note reviewed.  Constitutional:      Appearance: She is well-developed.  HENT:      Head: Normocephalic and atraumatic.  Cardiovascular:     Rate and Rhythm: Normal rate and regular rhythm.     Heart sounds: Normal heart sounds.  Pulmonary:     Effort: Pulmonary effort is normal.     Breath sounds: Normal breath sounds.  Abdominal:     Palpations: Abdomen is soft.     Tenderness: There is no abdominal tenderness.  Skin:    General: Skin is warm and dry.  Neurological:     Mental Status: She is alert.     Comments: CN 3-12 grossly intact. 5/5 strength in all 4 extremities. Grossly normal sensation. Normal finger to nose.   Psychiatric:        Thought Content: Thought content does not include homicidal or suicidal ideation.     ED Results / Procedures / Treatments   Labs (all labs ordered are listed, but only abnormal results are displayed) Labs Reviewed  URINALYSIS, ROUTINE W REFLEX MICROSCOPIC - Abnormal; Notable for the following components:      Result Value   Leukocytes,Ua TRACE (*)    All other components within normal limits  COMPREHENSIVE METABOLIC PANEL  CBC  CBG MONITORING, ED    EKG None  Radiology CT Head Wo Contrast  Result Date: 02/27/2023 CLINICAL DATA:  Brain metastases suspected presents with mood swings, per pt she has breast cancer, and whatever oral meds they gave her causes mood swings, last episode was on yesterday when PD has to be called to the house EXAM: CT HEAD WITHOUT CONTRAST TECHNIQUE: Contiguous axial images were obtained from the base of the skull through the vertex without intravenous contrast. RADIATION DOSE REDUCTION: This exam was performed according to the departmental dose-optimization program which includes automated exposure control, adjustment of the mA and/or kV according to patient size and/or use of iterative reconstruction technique. COMPARISON:  MRI head 08/19/2014, CT head 08/18/2014 FINDINGS: Brain: No evidence of large-territorial acute infarction. No parenchymal hemorrhage. Interval increase in size of a 2.4 x  1.6 cm pituitary mass (5:28). No extra-axial collection. No mass effect or midline shift. No hydrocephalus. Basilar cisterns are patent. Vascular: No hyperdense vessel. Skull: No acute fracture or focal lesion. Sinuses/Orbits: Right maxillary and ethmoid sinus mucosal thickening. Otherwise paranasal sinuses and mastoid air cells are clear. Bilateral lens replacement. Otherwise the orbits are unremarkable. Other: None. IMPRESSION: 1. Interval increase in size of a 2.4 x 1.6 cm (from 16 x 15 cm) pituitary mass. Recommend MRI pituitary gland protocol for further evaluation. 2. Please note MRI with and without contrast is a more sensitive evaluation for metastases. Electronically Signed   By: Normajean Glasgow.D.  On: 02/27/2023 20:30    Procedures Procedures    Medications Ordered in ED Medications - No data to display  ED Course/ Medical Decision Making/ A&P                                 Medical Decision Making Amount and/or Complexity of Data Reviewed Labs: ordered.    Details: No significant electrolyte disturbance Radiology: ordered.    Details: No head bleed  Risk Prescription drug management.   Patient presents with worsening behavioral complaints.  She does have a history of breast cancer and while it does not seem like there was any evidence that this is metastatic, with her age and presentation a CT was obtained.  There is no obvious frontal lobe edema to suggest metastasis though technically this cannot be ruled out without MRI.  However MRI is not available at this facility at this hour.  She also has a pituitary gland that has grown since last being seen in 2016.  I discussed all this with neurology, Dr. Derry Lory.  His recommendation is that she needs an MRI urgently tonight and she should go to [facility].   Neurology should be consulted after the results but may also need neurosurgery depending on what is found.  From a pituitary gland perspective, I asked about vision  changes and patient seems to be indicating she has had some chronic progressive vision issues but it seems more like worsening eyesight rather than visual field defects.  Patient will go POV to [facility].  Discussed with Dr. Wilkie Aye who is excepted the patient.   Addendum: 11:42 PM After transfer was set up, MRI tech said she could actually do an MRI here.  Thus MRI was performed here and Dr. Oletta Cohn will follow-up on the results and discuss with neurology.     Final Clinical Impression(s) / ED Diagnoses Final diagnoses:  None    Rx / DC Orders ED Discharge Orders     None         Pricilla Loveless, MD 02/27/23 2226    Pricilla Loveless, MD 02/27/23 2342

## 2023-02-27 NOTE — ED Triage Notes (Signed)
Pt presents with mood swings, per pt she has breast cancer, and whatever oral meds they gave her causes mood swings, last episode was on yesterday when PD has to be called to the house, medication was changed, but pt refuses to take them, d/t the side effects.

## 2023-02-27 NOTE — ED Notes (Signed)
Patient transported to CT 

## 2023-02-27 NOTE — Discharge Instructions (Addendum)
You have a pituitary tumor in your brain.  This is not cancer but it can cause pressure as well as some hormonal abnormalities as it grows.  You need to follow-up with neurosurgery in the office as soon as possible.  Please call Dr. Wynetta Emery, listed above, for this follow-up.

## 2023-02-27 NOTE — ED Notes (Signed)
Patient returned from MRI.

## 2023-02-27 NOTE — ED Notes (Signed)
Patient transported to MRI in wheelchair

## 2023-02-28 NOTE — ED Provider Notes (Addendum)
Patient signed out to me by Dr. Criss Alvine.  MRI pending at time of signout.  Patient with history of breast cancer and was being worked up for depression, mood swings, agitation and anger.  MRI was performed to ensure that there was no metastatic disease.  Patient has a known pituitary adenoma.  This has enlarged since last CT.  MRI consistent with adenoma, no other findings.  Discussed briefly with Dr. Derry Lory, neurology.  Patient needs prompt neurosurgical follow-up but does not need hospitalization or consultation tonight.    Patient report that she was on some medication to treat her breast cancer that was causing her mood swings and erratic behavior.  She has since stopped the medication.  She feels safe, does not feel like she needs to talk to behavioral health tonight.  She will be discharged and is referred to surgery on-call.   Gilda Crease, MD 02/28/23 [phone]

## 2023-03-06 ENCOUNTER — Other Ambulatory Visit: Payer: Self-pay | Admitting: Internal Medicine

## 2023-03-06 ENCOUNTER — Ambulatory Visit (INDEPENDENT_AMBULATORY_CARE_PROVIDER_SITE_OTHER): Payer: Medicare HMO | Admitting: Nurse Practitioner

## 2023-03-06 ENCOUNTER — Encounter: Payer: Self-pay | Admitting: Nurse Practitioner

## 2023-03-06 VITALS — BP 128/68 | HR 62 | Temp 97.5°F | Ht 65.0 in | Wt 134.6 lb

## 2023-03-06 DIAGNOSIS — E782 Mixed hyperlipidemia: Secondary | ICD-10-CM

## 2023-03-06 DIAGNOSIS — Z0001 Encounter for general adult medical examination with abnormal findings: Secondary | ICD-10-CM | POA: Diagnosis not present

## 2023-03-06 DIAGNOSIS — I10 Essential (primary) hypertension: Secondary | ICD-10-CM

## 2023-03-06 DIAGNOSIS — F331 Major depressive disorder, recurrent, moderate: Secondary | ICD-10-CM | POA: Insufficient documentation

## 2023-03-06 DIAGNOSIS — F411 Generalized anxiety disorder: Secondary | ICD-10-CM | POA: Diagnosis not present

## 2023-03-06 NOTE — Progress Notes (Signed)
New Patient Office Visit  Subjective    Patient ID: JENNALISE KERSTEN, female    DOB: Aug 14, 1940  Age: 82 y.o. MRN: [id number]  CC:  Chief Complaint  Patient presents with   Establish Care   Breast Cancer    Was on chemo for breast cancer. Stopped taking it do to the side effects effected her mental health, angry outburst, emotional, anxious. Oncologist is aware.     HPI Malinda CHAILLE MICHAELS is a 82 yrs old female presents to establish care with her niece. Concerns for lack of energy and irritability. PMH breast cancer, pituitary tumor, HTN, GERD, hiperlipidemia.  Breast cancer: Completed 25-months of radiatiosn and was supposed to stay on chemo medications for five yrs. She only took  the meds for a few months an decided to discontinue treatment because of the side effects of the medication. Her niece reports that she has not been herself and has been snapping at people. "She angry easily and irritated and cursing, and yelling". Client states  " I am not myself, I feel drained. I am a person that stay on the GO, now I am dreaded to do anything. I literally have to force myself to get out of bed". She has was dx with pituitary tumor, which remains stable  I had an MRI a few weeks ago the tumor hasn't changed in size  Anxiety/Depression: Client reports his provider suggested a referral to psych, at that se declines. She reports irritability, fatigue, anhedonia. She is reluctant to medication due to black box warning of suicide. " My daughter was prescribed Lexapro and attempted suicide, and great grandmother committed suicide". Explain to her that most SSRI have a black box warning about increase suicide. She was prescribed Celexa I the past and denies any side effects or AE from the medications. He denies SI/HI. She is more incline to counseling.     03/06/2023    3:46 PM 03/06/2023    3:17 PM 11/29/2022    1:10 PM  PHQ9 SCORE ONLY  PHQ-9 Total Score 13 16 0      03/06/2023    3:44 PM  03/06/2023    3:17 PM  GAD 7 : Generalized Anxiety Score  Nervous, Anxious, on Edge 2 2  Control/stop worrying 1 1  Worry too much - different things 2 2  Trouble relaxing 2 2  Restless 2 2  Easily annoyed or irritable 2 2  Afraid - awful might happen 2 2  Total GAD 7 Score 13 13  Anxiety Difficulty Somewhat difficult Somewhat difficult    HTN She has a dx of HTN being managed with Amlodipine 2.5 mg, Benincar 40 mg , Bisoprolol-HZTC 25-6.25 mg  BP Readings from Last 3 Encounters:  03/06/23 128/68  02/28/23 134/65  11/29/22 (!) 140/60   Wt Readings from Last 3 Encounters:  03/06/23 134 lb 9.6 oz (61.1 kg)  02/27/23 137 lb (62.1 kg)  11/29/22 134 lb 12.8 oz (61.1 kg)     BP at that visit was 134/65. Management since that visit includes Amlodipine 2.5 mg, Benincar 40 mg , Bisoprolol-HZTC 25-6.25 mg She reports poor compliance with treatment, only been taking one med for the last 63-months She is not having side effects.  She is following a Regular diet. She is not exercising. She does not smoke. Use of agents associated with hypertension: none.  Outside blood pressures are normal historical data from client Symptoms: No chest pain No chest pressure  No palpitations No syncope  No dyspnea No orthopnea  No paroxysmal nocturnal dyspnea No lower extremity edema   Pertinent labs Lab Results  Component Value Date   CHOL 182 11/29/2022   HDL 56 11/29/2022   LDLCALC 107 (H) 11/29/2022   TRIG 99 11/29/2022   CHOLHDL 3.3 11/29/2022   Lab Results  Component Value Date   NA 138 02/27/2023   K 3.6 02/27/2023   CREATININE 0.68 02/27/2023   GFRNONAA >60 02/27/2023   GLUCOSE 88 02/27/2023   TSH 2.05 08/11/2022     The ASCVD Risk score (Arnett DK, et al., 2019) failed to calculate for the following reasons:   The 2019 ASCVD risk score is only valid for ages 55 to 28  ---------------------------------------------------------------------------------------------------  current on  almlodine,  Bisoprolol and hydrochlorothiazid and benicar which she reporst that she has been taking sproradically d/c amlodopina an continue bneicar  Outpatient Encounter Medications as of 03/06/2023  Medication Sig   anastrozole (ARIMIDEX) 1 MG tablet Take 1 tablet by mouth once daily   meclizine (ANTIVERT) 25 MG tablet Take 1 tablet 2 to 3 x /day as needed for Vertigo   olmesartan (BENICAR) 40 MG tablet Take  1 tablet  at Night  for BP                                                            /                                                     TAKE                                           BY                              MOUTH   [DISCONTINUED] amLODipine (NORVASC) 2.5 MG tablet Take  1 tablet  Daily for BP                                                             /                                                           Take                                 by                                  mouth  once daily   [DISCONTINUED] bisoprolol-hydrochlorothiazide (ZIAC) 2.5-6.25 MG tablet Take  1 tablet  Daily   for BP                                                                        /                                             Take                              by                        mouth                                once daily   [DISCONTINUED] pantoprazole (PROTONIX) 40 MG tablet Take 40 mg by mouth daily.   [DISCONTINUED] PARoxetine (PAXIL) 30 MG tablet Take  1 tablet Daily for Mood                                        /                                   TAKE                                                BY                                  MOUTH                                    ONCE DAILY   Cholecalciferol 50 MCG (2000 UT) CHEW Chew 6,000 Units by mouth daily. Gummies (Patient not taking: Reported on 03/06/2023)   [DISCONTINUED] naproxen (NAPROSYN) 375 MG tablet Take 1 tablet (375 mg total) by mouth 2 (two) times daily. Take with food (Patient not  taking: Reported on 03/06/2023)   [DISCONTINUED] traMADol (ULTRAM) 50 MG tablet Take 1 tablet (50 mg total) by mouth every 6 (six) hours as needed. (Patient not taking: Reported on 03/06/2023)   No facility-administered encounter medications on file as of 03/06/2023.    Past Medical History:  Diagnosis Date   Arthritis    Breast cancer, right (HCC)    Colon cancer (HCC) 1990s, 2014   Colon polyps    Depression    Dysrhythmia 04/06/2012   GERD (gastroesophageal reflux disease)    Headache(784.0)    hx migraines   History of radiation therapy  Right breast- 05/31/22-06/30/22- Dr. Antony Blackbird   Hypertension 1999   Pituitary mass (HCC) 04/06/2012   MASS FOUND AFTER PT EXPERIENCED BILATERAL BLURRED / HAZY VISION -ESP LEFT EYE.  S/P CRANIOTOMY AND EYESIGHT RETURNED TO NORMAL--PT STILL HAS A LOT OF TENDERNESS RIGHT SIDE OF NOSE --THE CRANIOTOMY WAS DONE BY TRANSNASAL APPROACH   Pneumonia yrs ago   hx of pneumonia   PONV (postoperative nausea and vomiting)    PT STATES PLEASE NOTE SHE HAS TENDERNESS RIGHT SIDE OF NOSE - SINCE TRANSNASAL CRANIOTOMY 04/2012   Pre-diabetes    Prediabetes    Vitamin D deficiency     Past Surgical History:  Procedure Laterality Date   BACK SURGERY     lower   BREAST LUMPECTOMY WITH RADIOACTIVE SEED LOCALIZATION Right 04/07/2022   Procedure: RIGHT BREAST LUMPECTOMY WITH RADIOACTIVE SEED LOCALIZATION X2;  Surgeon: Almond Lint, MD;  Location: [facility] OR;  Service: General;  Laterality: Right;   CATARACT EXTRACTION  June/july 2013   bilateral eyes   COLON RESECTION  1988   COLON SURGERY  1990's   COLONOSCOPY  09/20/2011   Procedure: COLONOSCOPY;  Surgeon: Barrie Folk, MD;  Location: [facility];  Service: Endoscopy;  Laterality: N/A;   COLONOSCOPY  12/15/2011   Procedure: COLONOSCOPY;  Surgeon: Barrie Folk, MD;  Location: [facility];  Service: Endoscopy;  Laterality: N/A;   COLONOSCOPY  01/26/2012   Procedure: COLONOSCOPY;  Surgeon: Florencia Reasons, MD;   Location: [facility];  Service: Endoscopy;  Laterality: N/A;   CRANIOTOMY  04/18/2012   Procedure: CRANIOTOMY HYPOPHYSECTOMY TRANSNASAL APPROACH;  Surgeon: Cristi Loron, MD;  Location: [facility];  Service: Neurosurgery;  Laterality: Bilateral;  Transphenoidal resection of Pituitary tumor.    CYSTOSCOPY WITH URETEROSCOPY AND STENT PLACEMENT Bilateral 10/01/2015   Procedure: CYSTOSCOPY, retrograde WITH URETEROSCOPY AND STENT PLACEMENT, ;  Surgeon: Heloise Purpura, MD;  Location: [facility];  Service: Urology;  Laterality: Bilateral;   CYSTOSCOPY WITH URETEROSCOPY AND STENT PLACEMENT Right 10/19/2015   Procedure: CYSTOSCOPY,  RIGHT URETEROSCOPY, HOLMIUM LASER RIGHT URETERAL STONE, RIGHT URETERAL STENT PLACEMENT, LEFT URETEROSCOPY, INSERTION LEFT URETERAL STENT;  Surgeon: Heloise Purpura, MD;  Location: [facility];  Service: Urology;  Laterality: Right;   EYE SURGERY     cataract bil   HOLMIUM LASER APPLICATION Right 10/19/2015   Procedure: HOLMIUM LASER  ;  Surgeon: Heloise Purpura, MD;  Location: [facility];  Service: Urology;  Laterality: Right;   HOT HEMOSTASIS  09/20/2011   Procedure: HOT HEMOSTASIS (ARGON PLASMA COAGULATION/BICAP);  Surgeon: Barrie Folk, MD;  Location: [facility];  Service: Endoscopy;  Laterality: N/A;   LAPAROSCOPIC RIGHT HEMI COLECTOMY  06/27/2012   Procedure: LAPAROSCOPIC RIGHT HEMI COLECTOMY;  Surgeon: Valarie Merino, MD;  Location: [facility];  Service: General;  Laterality: N/A;  Laparoscopic Assited Right Hemicolectomy   TRANSNASAL APPROACH  04/18/2012   Procedure: TRANSNASAL APPROACH;  Surgeon: Drema Halon, MD;  Location: [facility];  Service: ENT;  Laterality: Bilateral;  Transnasal approach   TUMOR REMOVAL     Left leg    Family History  Problem Relation Age of Onset   Heart disease Mother    Heart disease Father    Colon cancer Sister 42   Breast cancer Sister        in 90's   Dementia Sister    Cancer Maternal Aunt        unknown form of cancer dx in her  19s-50s   Liver cancer Maternal Uncle  heavy ETOH user   Colon cancer Cousin    Brain cancer Cousin        dx in his 8s; paternal cousin   Colon polyps Son    Colon polyps Son    Colon cancer Other 30   Colon polyps Other     Social History   Socioeconomic History   Marital status: Widowed    Spouse name: Not on file   Number of children: 3   Years of education: Not on file   Highest education level: Not on file  Occupational History   Not on file  Tobacco Use   Smoking status: Never   Smokeless tobacco: Never  Vaping Use   Vaping status: Never Used  Substance and Sexual Activity   Alcohol use: Yes    Comment: a couple times a year, one drink at a time   Drug use: No   Sexual activity: Not Currently    Birth control/protection: Post-menopausal  Other Topics Concern   Not on file  Social History Narrative   Not on file   Social Determinants of Health   Financial Resource Strain: Low Risk  (05/17/2022)   Overall Financial Resource Strain (CARDIA)    Difficulty of Paying Living Expenses: Not hard at all  Food Insecurity: No Food Insecurity (05/17/2022)   Hunger Vital Sign    Worried About Running Out of Food in the Last Year: Never true    Ran Out of Food in the Last Year: Never true  Transportation Needs: No Transportation Needs (05/17/2022)   PRAPARE - Administrator, Civil Service (Medical): No    Lack of Transportation (Non-Medical): No  Physical Activity: Not on file  Stress: Not on file  Social Connections: Not on file  Intimate Partner Violence: Not At Risk (05/17/2022)   Humiliation, Afraid, Rape, and Kick questionnaire    Fear of Current or Ex-Partner: No    Emotionally Abused: No    Physically Abused: No    Sexually Abused: No    Review of Systems  Constitutional:  Negative for chills, fever and malaise/fatigue.  HENT:  Negative for ear pain and sore throat.   Eyes:  Negative for double vision and pain.  Respiratory:  Negative  for cough and shortness of breath.   Cardiovascular:  Negative for chest pain and leg swelling.  Gastrointestinal:  Negative for blood in stool, constipation, nausea and vomiting.  Genitourinary:  Negative for frequency, hematuria and urgency.  Musculoskeletal:  Negative for falls and myalgias.  Skin:  Negative for itching and rash.  Neurological:  Negative for dizziness, tremors, seizures and headaches.  Endo/Heme/Allergies:  Negative for environmental allergies and polydipsia. Does not bruise/bleed easily.  Psychiatric/Behavioral:  Positive for depression. Negative for memory loss and suicidal ideas. The patient is nervous/anxious. The patient does not have insomnia.    Negative unless indicated in HPI   Objective    BP 128/68   Pulse 62   Temp (!) 97.5 F (36.4 C) (Temporal)   Ht 5\' 5"  (1.651 m)   Wt 134 lb 9.6 oz (61.1 kg)   SpO2 98%   BMI 22.40 kg/m   Physical Exam  Last CBC Lab Results  Component Value Date   WBC 5.1 02/27/2023   HGB 12.3 02/27/2023   HCT 38.1 02/27/2023   MCV 90.1 02/27/2023   MCH 29.1 02/27/2023   RDW 12.9 02/27/2023   PLT 241 02/27/2023   Last metabolic panel Lab Results  Component Value Date  GLUCOSE 88 02/27/2023   NA 138 02/27/2023   K 3.6 02/27/2023   CL 106 02/27/2023   CO2 25 02/27/2023   BUN 11 02/27/2023   CREATININE 0.68 02/27/2023   GFRNONAA >60 02/27/2023   CALCIUM 9.1 02/27/2023   PHOS 3.6 08/19/2014   PROT 6.7 02/27/2023   ALBUMIN 3.9 02/27/2023   BILITOT 0.6 02/27/2023   ALKPHOS 69 02/27/2023   AST 17 02/27/2023   ALT 13 02/27/2023   ANIONGAP 7 02/27/2023   Last lipids Lab Results  Component Value Date   CHOL 182 11/29/2022   HDL 56 11/29/2022   LDLCALC 107 (H) 11/29/2022   TRIG 99 11/29/2022   CHOLHDL 3.3 11/29/2022   Last hemoglobin A1c Lab Results  Component Value Date   HGBA1C 5.6 11/29/2022   Last thyroid functions Lab Results  Component Value Date   TSH 2.05 08/11/2022    Assessment & Plan:   Moderate episode of recurrent major depressive disorder (HCC) -     Amb ref to [facility]  GAD (generalized anxiety disorder) -     Amb ref to [facility]  Encounter for general adult medical examination with abnormal findings -     CBC with Differential/Platelet -     CMP14+EGFR -     Thyroid Panel With TSH -     Lipid panel  Essential hypertension -     CBC with Differential/Platelet  Hyperlipidemia, mixed -     Lipid panel   Eirene is 82 yrs old, no acute distress MDD/GAD: she declines medications and is referred to counseling Labs: CBC, CMP, Lipid, TSH HTN: D/c Amlodipine 2.5 mg, Bisoprolol-HZTC 25-6.25 mg. Continue Benincar 40 mg Low salt diet, CMP   Encourage healthy lifestyle choices, including diet (rich in fruits, vegetables, and lean proteins, and low in salt and simple carbohydrates) and exercise (at least 30 minutes of moderate physical activity daily).     The above assessment and management plan was discussed with the patient. The patient verbalized understanding of and has agreed to the management plan. Patient is aware to call the clinic if they develop any new symptoms or if symptoms persist or worsen. Patient is aware when to return to the clinic for a follow-up visit. Patient educated on when it is appropriate to go to the emergency department.  Return in about 2 months (around 05/06/2023).   Arrie Aran Santa Lighter, DNP [facility] [address] [place], [place] [address] [phone]

## 2023-03-07 ENCOUNTER — Encounter: Payer: Self-pay | Admitting: Nurse Practitioner

## 2023-03-07 LAB — CBC WITH DIFFERENTIAL/PLATELET
Basophils Absolute: 0 x10E3/uL (ref 0.0–0.2)
Basos: 0 %
EOS (ABSOLUTE): 0.1 x10E3/uL (ref 0.0–0.4)
Eos: 1 %
Hematocrit: 38.1 % (ref 34.0–46.6)
Hemoglobin: 12.4 g/dL (ref 11.1–15.9)
Immature Grans (Abs): 0 x10E3/uL (ref 0.0–0.1)
Immature Granulocytes: 0 %
Lymphocytes Absolute: 1.1 x10E3/uL (ref 0.7–3.1)
Lymphs: 16 %
MCH: 30 pg (ref 26.6–33.0)
MCHC: 32.5 g/dL (ref 31.5–35.7)
MCV: 92 fL (ref 79–97)
Monocytes Absolute: 0.4 x10E3/uL (ref 0.1–0.9)
Monocytes: 5 %
Neutrophils Absolute: 5.4 x10E3/uL (ref 1.4–7.0)
Neutrophils: 78 %
Platelets: 247 x10E3/uL (ref 150–450)
RBC: 4.14 x10E6/uL (ref 3.77–5.28)
RDW: 13.1 % (ref 11.7–15.4)
WBC: 7.1 x10E3/uL (ref 3.4–10.8)

## 2023-03-07 LAB — CMP14+EGFR
ALT: 12 IU/L (ref 0–32)
AST: 15 IU/L (ref 0–40)
Albumin: 4.3 g/dL (ref 3.7–4.7)
Alkaline Phosphatase: 80 IU/L (ref 44–121)
BUN/Creatinine Ratio: 21 (ref 12–28)
BUN: 17 mg/dL (ref 8–27)
Bilirubin Total: 0.4 mg/dL (ref 0.0–1.2)
CO2: 23 mmol/L (ref 20–29)
Calcium: 9.7 mg/dL (ref 8.7–10.3)
Chloride: 106 mmol/L (ref 96–106)
Creatinine, Ser: 0.81 mg/dL (ref 0.57–1.00)
Globulin, Total: 2.2 g/dL (ref 1.5–4.5)
Glucose: 86 mg/dL (ref 70–99)
Potassium: 4 mmol/L (ref 3.5–5.2)
Sodium: 142 mmol/L (ref 134–144)
Total Protein: 6.5 g/dL (ref 6.0–8.5)
eGFR: 72 mL/min/1.73

## 2023-03-07 LAB — LIPID PANEL
Chol/HDL Ratio: 3.8 ratio (ref 0.0–4.4)
Cholesterol, Total: 189 mg/dL (ref 100–199)
HDL: 50 mg/dL
LDL Chol Calc (NIH): 114 mg/dL — ABNORMAL HIGH (ref 0–99)
Triglycerides: 139 mg/dL (ref 0–149)
VLDL Cholesterol Cal: 25 mg/dL (ref 5–40)

## 2023-03-07 LAB — THYROID PANEL WITH TSH
Free Thyroxine Index: 1.8 (ref 1.2–4.9)
T3 Uptake Ratio: 22 % — ABNORMAL LOW (ref 24–39)
T4, Total: 8.1 ug/dL (ref 4.5–12.0)
TSH: 1.3 u[IU]/mL (ref 0.450–4.500)

## 2023-03-13 ENCOUNTER — Inpatient Hospital Stay: Payer: Medicare HMO | Attending: Oncology | Admitting: Oncology

## 2023-03-13 VITALS — BP 141/71 | HR 60 | Temp 98.2°F | Resp 18 | Ht 65.0 in | Wt 136.2 lb

## 2023-03-13 DIAGNOSIS — Z1721 Progesterone receptor positive status: Secondary | ICD-10-CM | POA: Diagnosis not present

## 2023-03-13 DIAGNOSIS — I10 Essential (primary) hypertension: Secondary | ICD-10-CM | POA: Diagnosis not present

## 2023-03-13 DIAGNOSIS — C50811 Malignant neoplasm of overlapping sites of right female breast: Secondary | ICD-10-CM | POA: Diagnosis not present

## 2023-03-13 DIAGNOSIS — F32A Depression, unspecified: Secondary | ICD-10-CM | POA: Diagnosis not present

## 2023-03-13 DIAGNOSIS — Z170 Estrogen receptor positive status [ER+]: Secondary | ICD-10-CM | POA: Diagnosis not present

## 2023-03-13 MED ORDER — LETROZOLE 2.5 MG PO TABS
2.5000 mg | ORAL_TABLET | Freq: Every day | ORAL | 5 refills | Status: AC
Start: 2023-03-13 — End: ?

## 2023-03-13 NOTE — Progress Notes (Signed)
[facility] OFFICE PROGRESS NOTE   Diagnosis: Breast cancer  INTERVAL HISTORY:   Kristen [name] returns as scheduled.  She began anastrozole in January after completing right breast radiation.  She reports taking the anastrozole for approximately 1.5 months.  She discontinued anastrozole secondary to altered mental status.  She reports feeling "crazy ".  These neurologic symptoms did not resolve after discontinuing anastrozole.  She remains off of anastrozole.  She has been diagnosed with a pituitary adenoma and is scheduled to see neurosurgery within the next week. She generally feels well.  Good appetite.  No pain.  No change over either breast. Objective:  Vital signs in last 24 hours:  Blood pressure (!) 141/71, pulse 60, temperature 98.2 F (36.8 C), temperature source Temporal, resp. rate 18, height 5\' 5"  (1.651 m), weight 136 lb 3.2 oz (61.8 kg), SpO2 100%.    HEENT: No visible mass in either nostril Lymphatics: No cervical, supraclavicular, or axillary nodes Resp: Lungs clear bilaterally Cardio: Regular rate and rhythm GI: No hepatosplenomegaly Vascular: No leg edema Breast: Right breast lumpectomy scars without evidence of recurrent tumor, edema and skin thickening at the lower medial right breast.  No mass in either breast.  Both axillae appear benign.    Lab Results:  Lab Results  Component Value Date   WBC 7.1 03/06/2023   HGB 12.4 03/06/2023   HCT 38.1 03/06/2023   MCV 92 03/06/2023   PLT 247 03/06/2023   NEUTROABS 5.4 03/06/2023    CMP  Lab Results  Component Value Date   NA 142 03/06/2023   K 4.0 03/06/2023   CL 106 03/06/2023   CO2 23 03/06/2023   GLUCOSE 86 03/06/2023   BUN 17 03/06/2023   CREATININE 0.81 03/06/2023   CALCIUM 9.7 03/06/2023   PROT 6.5 03/06/2023   ALBUMIN 4.3 03/06/2023   AST 15 03/06/2023   ALT 12 03/06/2023   ALKPHOS 80 03/06/2023   BILITOT 0.4 03/06/2023   GFRNONAA >60 02/27/2023   GFRAA 85 07/27/2020      Medications: I have reviewed the patient's current medications.   Assessment/Plan: Breast cancer-synchronous stage I breast cancers Mammogram/ultrasound 03/10/2022-masses at the 3:00 (1.6 m) and 11 o'clock (0.9 cm) positions of the right breast Ultrasound-guided biopsy of 3:00 and 11:00 breast masses 03/24/2022-3:00 mass revealed moderately differentiated invasive ductal adenocarcinoma, grade 2, intermediate grade DCIS without necrosis, 11:00 biopsy-invasive lobular carcinoma, grade 2 Radio guided seed lumpectomies 04/07/2022 -3:00 invasive ductal carcinoma, grade 2, 15 x 12 x 10 mm, associated DCIS with focal necrosis, negative margins, 3 mm anterior margin ER 100%, PR 1%, HER2 negative, Ki-67 5% pT1c, PN X -11:00 of lobular carcinoma, grade 2, 11x10 x 10 mm, negative margins, 4 mm medial and superior margins, ER 95%, PR 20%, HER2 negative, Ki-67 2% pT1c, PN X Right breast radiation 05/31/2022 - 06/30/2022 Anastrozole 07/01/2022-took approximately 1.5 months and then discontinued secondary to a change in her mental status Letrozole 03/13/2023 Right breast erythema and edema 05/11/2022-prescribed course of doxycycline Pituitary adenoma 2013 MRI brain 02/27/2023-sellar/suprasellar mass consistent with a pituitary macroadenoma, increased from 2016 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  She completed adjuvant right breast radiation and began anastrozole in January of this year.  She discontinued anastrozole after several months after developing altered mental status.  I feel it  is unlikely the change in her mental status was related to anastrozole.  She continues to have changes in her thought process and word finding.  We discussed resuming anastrozole.  She agrees to begin  letrozole.  We reviewed potential toxicities associated with letrozole.  She will call if she notes a change in her mental status while on letrozole.  Kristen [name] will return for an office visit in 6 months.  She will be referred for a mammogram.   She will follow-up with neurosurgery this week to evaluate the sella/suprasellar mass and possible nasal mass. Thornton Papas, MD  03/13/2023  12:04 PM

## 2023-03-14 DIAGNOSIS — D352 Benign neoplasm of pituitary gland: Secondary | ICD-10-CM | POA: Diagnosis not present

## 2023-03-23 ENCOUNTER — Encounter: Payer: Self-pay | Admitting: Internal Medicine

## 2023-03-23 ENCOUNTER — Ambulatory Visit: Payer: Medicare HMO | Admitting: Internal Medicine

## 2023-03-23 NOTE — Progress Notes (Signed)
NO SHOW

## 2023-04-19 ENCOUNTER — Ambulatory Visit
Admission: RE | Admit: 2023-04-19 | Discharge: 2023-04-19 | Disposition: A | Discharge status: Home / Self Care | Payer: Medicare HMO | Source: Ambulatory Visit | Attending: Oncology | Admitting: Oncology

## 2023-04-19 DIAGNOSIS — Z853 Personal history of malignant neoplasm of breast: Secondary | ICD-10-CM | POA: Diagnosis not present

## 2023-04-19 DIAGNOSIS — Z9889 Other specified postprocedural states: Secondary | ICD-10-CM | POA: Diagnosis not present

## 2023-04-19 DIAGNOSIS — C50811 Malignant neoplasm of overlapping sites of right female breast: Secondary | ICD-10-CM

## 2023-04-26 NOTE — Telephone Encounter (Signed)
Telephone call  

## 2023-05-07 NOTE — Progress Notes (Unsigned)
Established Patient Office Visit  Subjective   Patient ID: Kristen [name], female    DOB: 1941-04-28  Age: 82 y.o. MRN: [id number]  No chief complaint on file.   HPI  Patient Active Problem List   Diagnosis Date Noted   Moderate episode of recurrent major depressive disorder (HCC) 03/06/2023   GAD (generalized anxiety disorder) 03/06/2023   Encounter for general adult medical examination with abnormal findings 03/06/2023   Breast cancer (HCC) 05/11/2022   Osteopenia 11/23/2020   Bradycardia 04/20/2020   Abnormal glucose 06/25/2019   Essential hypertension 08/19/2014   Pituitary tumor (excised 2013) 03/12/2014   Medication management 09/10/2013   Hyperlipidemia, mixed 06/11/2013   Vitamin D deficiency    Prediabetes    History of colon cancer, stage II 10/12/2012   Overweight (BMI 25.0-29.9) 07/01/2012   GERD (gastroesophageal reflux disease)    Major depression in remission (HCC)    SVT (supraventricular tachycardia) (HCC) 04/21/2012   Past Medical History:  Diagnosis Date   Arthritis    Breast cancer, right (HCC)    Colon cancer (HCC) 1990s, 2014   Colon polyps    Depression    Dysrhythmia 04/06/2012   GERD (gastroesophageal reflux disease)    Headache(784.0)    hx migraines   History of radiation therapy    Right breast- 05/31/22-06/30/22- Dr. Antony Blackbird   Hypertension 1999   Pituitary mass (HCC) 04/06/2012   MASS FOUND AFTER PT EXPERIENCED BILATERAL BLURRED / HAZY VISION -ESP LEFT EYE.  S/P CRANIOTOMY AND EYESIGHT RETURNED TO NORMAL--PT STILL HAS A LOT OF TENDERNESS RIGHT SIDE OF NOSE --THE CRANIOTOMY WAS DONE BY TRANSNASAL APPROACH   Pneumonia yrs ago   hx of pneumonia   PONV (postoperative nausea and vomiting)    PT STATES PLEASE NOTE SHE HAS TENDERNESS RIGHT SIDE OF NOSE - SINCE TRANSNASAL CRANIOTOMY 04/2012   Pre-diabetes    Prediabetes    Vitamin D deficiency    Past Surgical History:  Procedure Laterality Date   BACK SURGERY     lower    BREAST LUMPECTOMY WITH RADIOACTIVE SEED LOCALIZATION Right 04/07/2022   Procedure: RIGHT BREAST LUMPECTOMY WITH RADIOACTIVE SEED LOCALIZATION X2;  Surgeon: Almond Lint, MD;  Location: [facility] OR;  Service: General;  Laterality: Right;   CATARACT EXTRACTION  June/july 2013   bilateral eyes   COLON RESECTION  1988   COLON SURGERY  1990's   COLONOSCOPY  09/20/2011   Procedure: COLONOSCOPY;  Surgeon: Barrie Folk, MD;  Location: [facility];  Service: Endoscopy;  Laterality: N/A;   COLONOSCOPY  12/15/2011   Procedure: COLONOSCOPY;  Surgeon: Barrie Folk, MD;  Location: [facility] ENDOSCOPY;  Service: Endoscopy;  Laterality: N/A;   COLONOSCOPY  01/26/2012   Procedure: COLONOSCOPY;  Surgeon: Florencia Reasons, MD;  Location: [facility];  Service: Endoscopy;  Laterality: N/A;   CRANIOTOMY  04/18/2012   Procedure: CRANIOTOMY HYPOPHYSECTOMY TRANSNASAL APPROACH;  Surgeon: Cristi Loron, MD;  Location: [facility];  Service: Neurosurgery;  Laterality: Bilateral;  Transphenoidal resection of Pituitary tumor.    CYSTOSCOPY WITH URETEROSCOPY AND STENT PLACEMENT Bilateral 10/01/2015   Procedure: CYSTOSCOPY, retrograde WITH URETEROSCOPY AND STENT PLACEMENT, ;  Surgeon: Heloise Purpura, MD;  Location: [facility];  Service: Urology;  Laterality: Bilateral;   CYSTOSCOPY WITH URETEROSCOPY AND STENT PLACEMENT Right 10/19/2015   Procedure: CYSTOSCOPY,  RIGHT URETEROSCOPY, HOLMIUM LASER RIGHT URETERAL STONE, RIGHT URETERAL STENT PLACEMENT, LEFT URETEROSCOPY, INSERTION LEFT URETERAL STENT;  Surgeon: Heloise Purpura, MD;  Location: [facility];  Service:  Urology;  Laterality: Right;   EYE SURGERY     cataract bil   HOLMIUM LASER APPLICATION Right 10/19/2015   Procedure: HOLMIUM LASER  ;  Surgeon: Heloise Purpura, MD;  Location: [facility];  Service: Urology;  Laterality: Right;   HOT HEMOSTASIS  09/20/2011   Procedure: HOT HEMOSTASIS (ARGON PLASMA COAGULATION/BICAP);  Surgeon: Barrie Folk, MD;  Location: [facility];  Service: Endoscopy;   Laterality: N/A;   LAPAROSCOPIC RIGHT HEMI COLECTOMY  06/27/2012   Procedure: LAPAROSCOPIC RIGHT HEMI COLECTOMY;  Surgeon: Valarie Merino, MD;  Location: [facility];  Service: General;  Laterality: N/A;  Laparoscopic Assited Right Hemicolectomy   TRANSNASAL APPROACH  04/18/2012   Procedure: TRANSNASAL APPROACH;  Surgeon: Drema Halon, MD;  Location: [facility];  Service: ENT;  Laterality: Bilateral;  Transnasal approach   TUMOR REMOVAL     Left leg   Social History   Tobacco Use   Smoking status: Never   Smokeless tobacco: Never  Vaping Use   Vaping status: Never Used  Substance Use Topics   Alcohol use: Yes    Comment: a couple times a year, one drink at a time   Drug use: No   Social History   Socioeconomic History   Marital status: Widowed    Spouse name: Not on file   Number of children: 3   Years of education: Not on file   Highest education level: Not on file  Occupational History   Not on file  Tobacco Use   Smoking status: Never   Smokeless tobacco: Never  Vaping Use   Vaping status: Never Used  Substance and Sexual Activity   Alcohol use: Yes    Comment: a couple times a year, one drink at a time   Drug use: No   Sexual activity: Not Currently    Birth control/protection: Post-menopausal  Other Topics Concern   Not on file  Social History Narrative   Not on file   Social Determinants of Health   Financial Resource Strain: Low Risk  (05/17/2022)   Overall Financial Resource Strain (CARDIA)    Difficulty of Paying Living Expenses: Not hard at all  Food Insecurity: No Food Insecurity (05/17/2022)   Hunger Vital Sign    Worried About Running Out of Food in the Last Year: Never true    Ran Out of Food in the Last Year: Never true  Transportation Needs: Unmet Transportation Needs (03/06/2023)   PRAPARE - Administrator, Civil Service (Medical): Yes    Lack of Transportation (Non-Medical): Yes  Physical Activity: Not on file  Stress: Not  on file  Social Connections: Not on file  Intimate Partner Violence: Not At Risk (05/17/2022)   Humiliation, Afraid, Rape, and Kick questionnaire    Fear of Current or Ex-Partner: No    Emotionally Abused: No    Physically Abused: No    Sexually Abused: No   Family Status  Relation Name Status   Mother  Deceased at age 71   Father  Deceased at age 57   Sister Glenda Deceased at age 6   Sister Teresa Coombs Alive   Mat Aunt  Deceased   Mat Uncle  Deceased   MGM  Deceased at age 46       heart attack   MGF  Deceased at age "young"   PGM  Deceased at age 33-39       Suicide, possible post partum depression   PGF  Deceased  Cousin  Deceased at age 40s   Cousin  Alive   Brother "Brother" Deceased at age 53   Son  Alive   Son  Deceased at age 26       gun shot   Son  Engineer, civil (consulting) Deceased at age 16  No partnership data on file   Family History  Problem Relation Age of Onset   Heart disease Mother    Heart disease Father    Colon cancer Sister 75   Breast cancer Sister        in 13's   Dementia Sister    Cancer Maternal Aunt        unknown form of cancer dx in her 82s-50s   Liver cancer Maternal Uncle        heavy ETOH user   Colon cancer Cousin    Brain cancer Cousin        dx in his 1s; paternal cousin   Colon polyps Son    Colon polyps Son    Colon cancer Other 30   Colon polyps Other    Allergies  Allergen Reactions   Aspirin Other (See Comments)    High Doses stomach trouble   Codeine Nausea And Vomiting   Fenofibrate Cough   Gabapentin Other (See Comments)    Drowsiness   Hydrochlorothiazide Other (See Comments)    Fatigue   Metoclopramide Other (See Comments)    Sedation      ROS Negative unless indicated in HPI   Objective:     There were no vitals taken for this visit. BP Readings from Last 3 Encounters:  03/13/23 (!) 141/71  03/06/23 128/68  02/28/23 134/65   Wt Readings from Last 3 Encounters:  03/13/23 136 lb 3.2 oz (61.8 kg)   03/06/23 134 lb 9.6 oz (61.1 kg)  02/27/23 137 lb (62.1 kg)      Physical Exam   No results found for any visits on 05/08/23.  Last CBC Lab Results  Component Value Date   WBC 7.1 03/06/2023   HGB 12.4 03/06/2023   HCT 38.1 03/06/2023   MCV 92 03/06/2023   MCH 30.0 03/06/2023   RDW 13.1 03/06/2023   PLT 247 03/06/2023   Last metabolic panel Lab Results  Component Value Date   GLUCOSE 86 03/06/2023   NA 142 03/06/2023   K 4.0 03/06/2023   CL 106 03/06/2023   CO2 23 03/06/2023   BUN 17 03/06/2023   CREATININE 0.81 03/06/2023   EGFR 72 03/06/2023   CALCIUM 9.7 03/06/2023   PHOS 3.6 08/19/2014   PROT 6.5 03/06/2023   ALBUMIN 4.3 03/06/2023   LABGLOB 2.2 03/06/2023   BILITOT 0.4 03/06/2023   ALKPHOS 80 03/06/2023   AST 15 03/06/2023   ALT 12 03/06/2023   ANIONGAP 7 02/27/2023   Last lipids Lab Results  Component Value Date   CHOL 189 03/06/2023   HDL 50 03/06/2023   LDLCALC 114 (H) 03/06/2023   TRIG 139 03/06/2023   CHOLHDL 3.8 03/06/2023   Last hemoglobin A1c Lab Results  Component Value Date   HGBA1C 5.6 11/29/2022   Last thyroid functions Lab Results  Component Value Date   TSH 1.300 03/06/2023   T4TOTAL 8.1 03/06/2023        Assessment & Plan:  There are no diagnoses linked to this encounter. Continue healthy lifestyle choices, including diet (rich in fruits, vegetables, and lean proteins, and low in salt and simple carbohydrates) and exercise (at least  30 minutes of moderate physical activity daily).     The above assessment and management plan was discussed with the patient. The patient verbalized understanding of and has agreed to the management plan. Patient is aware to call the clinic if they develop any new symptoms or if symptoms persist or worsen. Patient is aware when to return to the clinic for a follow-up visit. Patient educated on when it is appropriate to go to the emergency department.  No follow-ups on file.    Arrie Aran Santa Lighter, [facility] [address] [place], [place] [address] [phone]  Note: This document was prepared by [employer] Milan voice dictation technology and any errors that results from this process are unintentional.

## 2023-05-08 ENCOUNTER — Ambulatory Visit: Payer: Medicare HMO | Admitting: Nurse Practitioner

## 2023-05-15 ENCOUNTER — Ambulatory Visit: Payer: Medicare HMO | Admitting: Nurse Practitioner

## 2023-05-18 ENCOUNTER — Ambulatory Visit: Payer: Medicare HMO | Admitting: Nurse Practitioner

## 2023-05-22 NOTE — Progress Notes (Signed)
Established Patient Office Visit  Subjective  Patient ID: Kristen [name], female    DOB: 12-01-1940  Age: 82 y.o. MRN: [id number]  Chief Complaint  Patient presents with   Medical Management of Chronic Issues   " HPI  Kristen [name] is a 82 yrs old female presents on 05/23/23 for 74-months f/u for chronic diseases. She was seen 18-months follow up for MDD and GAD. At her last visit she reports that she didn't wants to get medications or a referral to psychiatry. She reports  her grandson leaves with her "I raised him and he is very irritated" My husband was antique Sports coach my grandson destroyed all of them". Reports getting 6-7 hrs. Decrease appetitive, lack of energy "My grandson suggested that I get Valium". She reports that she just need something she can take PRN for depression. Explains to client that she will need to be on a long term med and agee to be referred to psychaitry     05/23/2023    2:37 PM 03/06/2023    3:46 PM 03/06/2023    3:17 PM  PHQ9 SCORE ONLY  PHQ-9 Total Score 24 13 16     Hypertension, follow-up  BP Readings from Last 3 Encounters:  05/23/23 134/76  03/13/23 (!) 141/71  03/06/23 128/68   Wt Readings from Last 3 Encounters:  05/23/23 137 lb 3.2 oz (62.2 kg)  03/13/23 136 lb 3.2 oz (61.8 kg)  03/06/23 134 lb 9.6 oz (61.1 kg)     She was last seen for hypertension 2 months ago.  BP at that visit was 141/71. Management since that visit includes benicar 40 mg. She reports excellent compliance with treatment. She is not having side effects.  She is following a Regular diet. She is exercising. She does not smoke. Use of agents associated with hypertension: none.  Outside blood pressures not checked. Symptoms: No chest pain No chest pressure  No palpitations No syncope  No dyspnea No orthopnea  No paroxysmal nocturnal dyspnea No lower extremity edema   Pertinent labs Lab Results  Component Value Date   CHOL 189 03/06/2023   HDL 50  03/06/2023   LDLCALC 114 (H) 03/06/2023   TRIG 139 03/06/2023   CHOLHDL 3.8 03/06/2023   Lab Results  Component Value Date   NA 142 03/06/2023   K 4.0 03/06/2023   CREATININE 0.81 03/06/2023   EGFR 72 03/06/2023   GLUCOSE 86 03/06/2023   TSH 1.300 03/06/2023     The ASCVD Risk score (Arnett DK, et al., 2019) failed to calculate for the following reasons:   The 2019 ASCVD risk score is only valid for ages 31 to 69   Patient Active Problem List   Diagnosis Date Noted   Moderate episode of recurrent major depressive disorder (HCC) 03/06/2023   GAD (generalized anxiety disorder) 03/06/2023   Encounter for general adult medical examination with abnormal findings 03/06/2023   Breast cancer (HCC) 05/11/2022   Osteopenia 11/23/2020   Bradycardia 04/20/2020   Abnormal glucose 06/25/2019   Essential hypertension 08/19/2014   Pituitary tumor (excised 2013) 03/12/2014   Medication management 09/10/2013   Hyperlipidemia, mixed 06/11/2013   Vitamin D deficiency    Prediabetes    History of colon cancer, stage II 10/12/2012   Overweight (BMI 25.0-29.9) 07/01/2012   GERD (gastroesophageal reflux disease)    Major depression in remission (HCC)    SVT (supraventricular tachycardia) (HCC) 04/21/2012   Past Medical History:  Diagnosis Date   Arthritis  Breast cancer, right (HCC)    Colon cancer (HCC) 1990s, 2014   Colon polyps    Depression    Dysrhythmia 04/06/2012   GERD (gastroesophageal reflux disease)    Headache(784.0)    hx migraines   History of radiation therapy    Right breast- 05/31/22-06/30/22- Dr. Antony Blackbird   Hypertension 1999   Pituitary mass (HCC) 04/06/2012   MASS FOUND AFTER PT EXPERIENCED BILATERAL BLURRED / HAZY VISION -ESP LEFT EYE.  S/P CRANIOTOMY AND EYESIGHT RETURNED TO NORMAL--PT STILL HAS A LOT OF TENDERNESS RIGHT SIDE OF NOSE --THE CRANIOTOMY WAS DONE BY TRANSNASAL APPROACH   Pneumonia yrs ago   hx of pneumonia   PONV (postoperative nausea and  vomiting)    PT STATES PLEASE NOTE SHE HAS TENDERNESS RIGHT SIDE OF NOSE - SINCE TRANSNASAL CRANIOTOMY 04/2012   Pre-diabetes    Prediabetes    Vitamin D deficiency    Past Surgical History:  Procedure Laterality Date   BACK SURGERY     lower   BREAST LUMPECTOMY WITH RADIOACTIVE SEED LOCALIZATION Right 04/07/2022   Procedure: RIGHT BREAST LUMPECTOMY WITH RADIOACTIVE SEED LOCALIZATION X2;  Surgeon: Almond Lint, MD;  Location: [facility] OR;  Service: General;  Laterality: Right;   CATARACT EXTRACTION  June/july 2013   bilateral eyes   COLON RESECTION  1988   COLON SURGERY  1990's   COLONOSCOPY  09/20/2011   Procedure: COLONOSCOPY;  Surgeon: Barrie Folk, MD;  Location: [facility];  Service: Endoscopy;  Laterality: N/A;   COLONOSCOPY  12/15/2011   Procedure: COLONOSCOPY;  Surgeon: Barrie Folk, MD;  Location: [facility];  Service: Endoscopy;  Laterality: N/A;   COLONOSCOPY  01/26/2012   Procedure: COLONOSCOPY;  Surgeon: Florencia Reasons, MD;  Location: [facility] ENDOSCOPY;  Service: Endoscopy;  Laterality: N/A;   CRANIOTOMY  04/18/2012   Procedure: CRANIOTOMY HYPOPHYSECTOMY TRANSNASAL APPROACH;  Surgeon: Cristi Loron, MD;  Location: [facility]URO ORS;  Service: Neurosurgery;  Laterality: Bilateral;  Transphenoidal resection of Pituitary tumor.    CYSTOSCOPY WITH URETEROSCOPY AND STENT PLACEMENT Bilateral 10/01/2015   Procedure: CYSTOSCOPY, retrograde WITH URETEROSCOPY AND STENT PLACEMENT, ;  Surgeon: Heloise Purpura, MD;  Location: [facility];  Service: Urology;  Laterality: Bilateral;   CYSTOSCOPY WITH URETEROSCOPY AND STENT PLACEMENT Right 10/19/2015   Procedure: CYSTOSCOPY,  RIGHT URETEROSCOPY, HOLMIUM LASER RIGHT URETERAL STONE, RIGHT URETERAL STENT PLACEMENT, LEFT URETEROSCOPY, INSERTION LEFT URETERAL STENT;  Surgeon: Heloise Purpura, MD;  Location: [facility];  Service: Urology;  Laterality: Right;   EYE SURGERY     cataract bil   HOLMIUM LASER APPLICATION Right 10/19/2015   Procedure: HOLMIUM LASER  ;   Surgeon: Heloise Purpura, MD;  Location: [facility];  Service: Urology;  Laterality: Right;   HOT HEMOSTASIS  09/20/2011   Procedure: HOT HEMOSTASIS (ARGON PLASMA COAGULATION/BICAP);  Surgeon: Barrie Folk, MD;  Location: [facility];  Service: Endoscopy;  Laterality: N/A;   LAPAROSCOPIC RIGHT HEMI COLECTOMY  06/27/2012   Procedure: LAPAROSCOPIC RIGHT HEMI COLECTOMY;  Surgeon: Valarie Merino, MD;  Location: [facility];  Service: General;  Laterality: N/A;  Laparoscopic Assited Right Hemicolectomy   TRANSNASAL APPROACH  04/18/2012   Procedure: TRANSNASAL APPROACH;  Surgeon: Drema Halon, MD;  Location: [facility]UR[facility];  Service: ENT;  Laterality: Bilateral;  Transnasal approach   TUMOR REMOVAL     Left leg   Social History   Tobacco Use   Smoking status: Never   Smokeless tobacco: Never  Vaping Use   Vaping status: Never  Used  Substance Use Topics   Alcohol use: Yes    Comment: a couple times a year, one drink at a time   Drug use: No   Social History   Socioeconomic History   Marital status: Widowed    Spouse name: Not on file   Number of children: 3   Years of education: Not on file   Highest education level: Not on file  Occupational History   Not on file  Tobacco Use   Smoking status: Never   Smokeless tobacco: Never  Vaping Use   Vaping status: Never Used  Substance and Sexual Activity   Alcohol use: Yes    Comment: a couple times a year, one drink at a time   Drug use: No   Sexual activity: Not Currently    Birth control/protection: Post-menopausal  Other Topics Concern   Not on file  Social History Narrative   Not on file   Social Drivers of Health   Financial Resource Strain: Low Risk  (05/17/2022)   Overall Financial Resource Strain (CARDIA)    Difficulty of Paying Living Expenses: Not hard at all  Food Insecurity: No Food Insecurity (05/17/2022)   Hunger Vital Sign    Worried About Running Out of Food in the Last Year: Never true    Ran Out of Food in the  Last Year: Never true  Transportation Needs: Unmet Transportation Needs (03/06/2023)   PRAPARE - Administrator, Civil Service (Medical): Yes    Lack of Transportation (Non-Medical): Yes  Physical Activity: Not on file  Stress: Not on file  Social Connections: Not on file  Intimate Partner Violence: Not At Risk (05/17/2022)   Humiliation, Afraid, Rape, and Kick questionnaire    Fear of Current or Ex-Partner: No    Emotionally Abused: No    Physically Abused: No    Sexually Abused: No   Family Status  Relation Name Status   Mother  Deceased at age 37   Father  Deceased at age 68   Sister Glenda Deceased at age 29   Sister Teresa Coombs Alive   Mat Aunt  Deceased   Mat Uncle  Deceased   MGM  Deceased at age 74       heart attack   MGF  Deceased at age "young"   PGM  Deceased at age 68-39       Suicide, possible post partum depression   PGF  Deceased   Cousin  Deceased at age 45s   Cousin  Alive   Brother "Brother" Deceased at age 29   Son  Alive   Son  Deceased at age 79       gun shot   Son  Engineer, civil (consulting) Deceased at age 36  No partnership data on file   Family History  Problem Relation Age of Onset   Heart disease Mother    Heart disease Father    Colon cancer Sister 21   Breast cancer Sister        in 57's   Dementia Sister    Cancer Maternal Aunt        unknown form of cancer dx in her 58s-50s   Liver cancer Maternal Uncle        heavy ETOH user   Colon cancer Cousin    Brain cancer Cousin        dx in his 98s; paternal cousin   Colon polyps Son    Colon polyps  Son    Colon cancer Other 30   Colon polyps Other    Allergies  Allergen Reactions   Aspirin Other (See Comments)    High Doses stomach trouble   Codeine Nausea And Vomiting   Fenofibrate Cough   Gabapentin Other (See Comments)    Drowsiness   Hydrochlorothiazide Other (See Comments)    Fatigue   Metoclopramide Other (See Comments)    Sedation      Review of Systems   Constitutional:  Negative for chills and fever.  HENT:  Negative for congestion.   Eyes:  Negative for pain.  Respiratory:  Negative for cough and shortness of breath.   Cardiovascular:  Negative for chest pain and leg swelling.  Gastrointestinal:  Negative for nausea and vomiting.  Musculoskeletal:  Negative for joint pain and myalgias.  Skin:  Negative for rash.  Psychiatric/Behavioral:  Positive for depression. Negative for suicidal ideas. The patient is nervous/anxious. The patient does not have insomnia.    Negative unless indicated in HPI   Objective:     BP 134/76   Pulse 60   Temp (!) 97.4 F (36.3 C) (Oral)   Ht 5\' 5"  (1.651 m)   Wt 137 lb 3.2 oz (62.2 kg)   SpO2 99%   BMI 22.83 kg/m  BP Readings from Last 3 Encounters:  05/23/23 134/76  03/13/23 (!) 141/71  03/06/23 128/68   Wt Readings from Last 3 Encounters:  05/23/23 137 lb 3.2 oz (62.2 kg)  03/13/23 136 lb 3.2 oz (61.8 kg)  03/06/23 134 lb 9.6 oz (61.1 kg)      Physical Exam Vitals and nursing note reviewed.  Constitutional:      General: She is not in acute distress.    Appearance: Normal appearance.  HENT:     Head: Normocephalic and atraumatic.     Nose: Nose normal.     Mouth/Throat:     Mouth: Mucous membranes are moist.  Eyes:     General: No scleral icterus.    Extraocular Movements: Extraocular movements intact.     Conjunctiva/sclera: Conjunctivae normal.     Pupils: Pupils are equal, round, and reactive to light.  Cardiovascular:     Rate and Rhythm: Normal rate and regular rhythm.  Pulmonary:     Effort: Pulmonary effort is normal.     Breath sounds: Normal breath sounds.  Musculoskeletal:        General: Normal range of motion.     Right lower leg: No edema.     Left lower leg: No edema.  Skin:    General: Skin is warm and dry.     Findings: No rash.  Neurological:     Mental Status: She is alert and oriented to person, place, and time. Mental status is at baseline.   Psychiatric:        Attention and Perception: Attention normal.        Mood and Affect: Mood and affect normal.        Speech: Speech normal.        Behavior: Behavior normal. Behavior is cooperative.        Thought Content: Thought content does not include homicidal or suicidal ideation. Thought content does not include homicidal or suicidal plan.        Cognition and Memory: Cognition and memory normal.        Judgment: Judgment normal.    No results found for any visits on 05/23/23.  Last CBC Lab Results  Component  Value Date   WBC 7.1 03/06/2023   HGB 12.4 03/06/2023   HCT 38.1 03/06/2023   MCV 92 03/06/2023   MCH 30.0 03/06/2023   RDW 13.1 03/06/2023   PLT 247 03/06/2023   Last metabolic panel Lab Results  Component Value Date   GLUCOSE 86 03/06/2023   NA 142 03/06/2023   K 4.0 03/06/2023   CL 106 03/06/2023   CO2 23 03/06/2023   BUN 17 03/06/2023   CREATININE 0.81 03/06/2023   EGFR 72 03/06/2023   CALCIUM 9.7 03/06/2023   PHOS 3.6 08/19/2014   PROT 6.5 03/06/2023   ALBUMIN 4.3 03/06/2023   LABGLOB 2.2 03/06/2023   BILITOT 0.4 03/06/2023   ALKPHOS 80 03/06/2023   AST 15 03/06/2023   ALT 12 03/06/2023   ANIONGAP 7 02/27/2023   Last lipids Lab Results  Component Value Date   CHOL 189 03/06/2023   HDL 50 03/06/2023   LDLCALC 114 (H) 03/06/2023   TRIG 139 03/06/2023   CHOLHDL 3.8 03/06/2023   Last hemoglobin A1c Lab Results  Component Value Date   HGBA1C 5.6 11/29/2022   Last thyroid functions Lab Results  Component Value Date   TSH 1.300 03/06/2023   T4TOTAL 8.1 03/06/2023   Last vitamin D Lab Results  Component Value Date   VD25OH 39 11/29/2022   Last vitamin B12 and Folate Lab Results  Component Value Date   VITAMINB12 262 12/23/2021        Assessment & Plan:  Moderate episode of recurrent major depressive disorder (HCC) -     Ambulatory referral to Psychiatry  GAD (generalized anxiety disorder) -     Ambulatory referral to  Psychiatry  Kristen [name] is a 82 year old Caucasian female seen today for hypertension in MDD and anxiety no acute distress HTN well-controlled with Benicar 40 mg daily client to continue medication as prescribed MDD/anxiety: Client is referred to psychiatry for further care Goal BP:  For patients younger than 60: Goal BP < 140/90. For patients 60 and older: Goal BP < 150/90. For patients with diabetes: Goal BP < 140/90.  Take your medications faithfully as prescribed. Maintain a healthy weight. Get at least 150 minutes of aerobic exercise per week. Minimize salt intake, less than 2000 mg per day. Minimize alcohol intake.  DASH Eating Plan DASH stands for "Dietary Approaches to Stop Hypertension." The DASH eating plan is a healthy eating plan that has been shown to reduce high blood pressure (hypertension). Additional health benefits may include reducing the risk of type 2 diabetes mellitus, heart disease, and stroke. The DASH eating plan may also help with weight loss.  WHAT DO I NEED TO KNOW ABOUT THE DASH EATING PLAN? For the DASH eating plan, you will follow these general guidelines: Choose foods with a percent daily value for sodium of less than 5% (as listed on the food label). Use salt-free seasonings or herbs instead of table salt or sea salt. Check with your health care provider or pharmacist before using salt substitutes. Eat lower-sodium products, often labeled as "lower sodium" or "no salt added." Eat fresh foods. Eat more vegetables, fruits, and low-fat dairy products. Choose whole grains. Look for the word "whole" as the first word in the ingredient list. Choose fish and skinless chicken or [place] more often than red meat. Limit fish, poultry, and meat to 6 oz (170 g) each day. Limit sweets, desserts, sugars, and sugary drinks. Choose heart-healthy fats. Limit cheese to 1 oz (28 g) per day. Eat more home-cooked food  and less restaurant, buffet, and fast food. Limit fried  foods. Cook foods using methods other than frying. Limit canned vegetables. If you do use them, rinse them well to decrease the sodium. When eating at a restaurant, ask that your food be prepared with less salt, or no salt if possible.  WHAT FOODS CAN I EAT? Seek help from a dietitian for individual calorie needs.  Grains Whole grain or whole wheat bread. Boehme rice. Whole grain or whole wheat pasta. Quinoa, bulgur, and whole grain cereals. Low-sodium cereals. Corn or whole wheat flour tortillas. Whole grain cornbread. Whole grain crackers. Low-sodium crackers.  Vegetables Fresh or frozen vegetables (raw, steamed, roasted, or grilled). Low-sodium or reduced-sodium tomato and vegetable juices. Low-sodium or reduced-sodium tomato sauce and paste. Low-sodium or reduced-sodium canned vegetables.   Fruits All fresh, canned (in natural juice), or frozen fruits.  Meat and Other Protein Products Ground beef (85% or leaner), grass-fed beef, or beef trimmed of fat. Skinless chicken or [place]. Ground chicken or [place]. Pork trimmed of fat. All fish and seafood. Eggs. Dried beans, peas, or lentils. Unsalted nuts and seeds. Unsalted canned beans.  Dairy Low-fat dairy products, such as skim or 1% milk, 2% or reduced-fat cheeses, low-fat ricotta or cottage cheese, or plain low-fat yogurt. Low-sodium or reduced-sodium cheeses.  Fats and Oils Tub margarines without trans fats. Light or reduced-fat mayonnaise and salad dressings (reduced sodium). Avocado. Safflower, olive, or canola oils. Natural peanut or almond butter.  Other Unsalted popcorn and pretzels. The items listed above may not be a complete list of recommended foods or beverages. Contact your dietitian for more options.  WHAT FOODS ARE NOT RECOMMENDED?  Grains White bread. White pasta. White rice. Refined cornbread. Bagels and croissants. Crackers that contain trans fat.  Vegetables Creamed or fried vegetables. Vegetables in a cheese  sauce. Regular canned vegetables. Regular canned tomato sauce and paste. Regular tomato and vegetable juices.  Fruits Dried fruits. Canned fruit in light or heavy syrup. Fruit juice.  Meat and Other Protein Products Fatty cuts of meat. Ribs, chicken wings, bacon, sausage, bologna, salami, chitterlings, fatback, hot dogs, bratwurst, and packaged luncheon meats. Salted nuts and seeds. Canned beans with salt.  Dairy Whole or 2% milk, cream, half-and-half, and cream cheese. Whole-fat or sweetened yogurt. Full-fat cheeses or blue cheese. Nondairy creamers and whipped toppings. Processed cheese, cheese spreads, or cheese curds.  Condiments Onion and garlic salt, seasoned salt, table salt, and sea salt. Canned and packaged gravies. Worcestershire sauce. Tartar sauce. Barbecue sauce. Teriyaki sauce. Soy sauce, including reduced sodium. Steak sauce. Fish sauce. Oyster sauce. Cocktail sauce. Horseradish. Ketchup and mustard. Meat flavorings and tenderizers. Bouillon cubes. Hot sauce. Tabasco sauce. Marinades. Taco seasonings. Relishes.  Fats and Oils Butter, stick margarine, lard, shortening, ghee, and bacon fat. Coconut, palm kernel, or palm oils. Regular salad dressings.  Other Pickles and olives. Salted popcorn and pretzels.  The items listed above may not be a complete list of foods and beverages to avoid. Contact your dietitian for more information.  WHERE CAN I FIND MORE INFORMATION? National Heart, [employer], and [employer] Institute: [employer] Document Released: 05/12/2011 Document Revised: 10/07/2013 Document Reviewed: 03/27/2013 [facility] Patient Information 2015 [place], [place]. This information is not intended to replace advice given to you by your health care provider. Make sure you discuss any questions you have with your health care provider.   I think that you would greatly benefit from seeing a nutritionist.  If you are interested, please call [address],  DNP [phone] to schedule an appointment.  Encourage healthy lifestyle choices, including diet (rich in fruits, vegetables, and lean proteins, and low in salt and simple carbohydrates) and exercise (at least 30 minutes of moderate physical activity daily).     The above assessment and management plan was discussed with the patient. The patient verbalized understanding of and has agreed to the management plan. Patient is aware to call the clinic if they develop any new symptoms or if symptoms persist or worsen. Patient is aware when to return to the clinic for a follow-up visit. Patient educated on when it is appropriate to go to the emergency department.  Return in about 4 weeks (around 06/20/2023) for chronic diseases.    Arrie Aran Santa Lighter, [facility] [address] [place], [place] [address] [phone]    Note: This document was prepared by [employer] voice dictation technology and any errors that results from this process are unintentional.

## 2023-05-23 ENCOUNTER — Encounter: Payer: Self-pay | Admitting: Nurse Practitioner

## 2023-05-23 ENCOUNTER — Ambulatory Visit (INDEPENDENT_AMBULATORY_CARE_PROVIDER_SITE_OTHER): Payer: Medicare HMO | Admitting: Nurse Practitioner

## 2023-05-23 VITALS — BP 134/76 | HR 60 | Temp 97.4°F | Ht 65.0 in | Wt 137.2 lb

## 2023-05-23 DIAGNOSIS — F411 Generalized anxiety disorder: Secondary | ICD-10-CM

## 2023-05-23 DIAGNOSIS — F331 Major depressive disorder, recurrent, moderate: Secondary | ICD-10-CM | POA: Diagnosis not present

## 2023-06-19 NOTE — Progress Notes (Deleted)
 Established Patient Office Visit  Subjective  Patient ID: Kristen [name], female    DOB: 09/13/1940  Age: 83 y.o. MRN: [id number]  No chief complaint on file.   HPI Kristen [name] Depression, Follow-up  She  was last seen for this {NUMBERS 1-12:18279} {days/wks/mos/yrs:310907} ago. Changes made at last visit include ***.   She reports {excellent/good/fair/poor:19665} compliance with treatment. She {is/is not:21021397} having side effects. ***  She reports {DESC; GOOD/FAIR/POOR:18685} tolerance of treatment. Current symptoms include: {Symptoms; depression:1002} She feels she is {improved/worse/unchanged:3041574} since last visit.     05/23/2023    2:37 PM 03/06/2023    3:46 PM 03/06/2023    3:17 PM  Depression screen PHQ 2/9  Decreased Interest 0  0  Down, Depressed, Hopeless 3 0 2  PHQ - 2 Score 3 0 2  Altered sleeping 3 2 0  Tired, decreased energy 3 0 2  Change in appetite 3 2 2   Feeling bad or failure about yourself  3 2 3   Trouble concentrating 3 3 3   Moving slowly or fidgety/restless 3 2 2   Suicidal thoughts 3 2 2   PHQ-9 Score 24 13 16   Difficult doing work/chores Somewhat difficult Somewhat difficult Somewhat difficult     Patient Active Problem List   Diagnosis Date Noted   Moderate episode of recurrent major depressive disorder (HCC) 03/06/2023   GAD (generalized anxiety disorder) 03/06/2023   Encounter for general adult medical examination with abnormal findings 03/06/2023   Breast cancer (HCC) 05/11/2022   Osteopenia 11/23/2020   Bradycardia 04/20/2020   Abnormal glucose 06/25/2019   Essential hypertension 08/19/2014   Pituitary tumor (excised 2013) 03/12/2014   Medication management 09/10/2013   Hyperlipidemia, mixed 06/11/2013   Vitamin D  deficiency    Prediabetes    History of colon cancer, stage II 10/12/2012   Overweight (BMI 25.0-29.9) 07/01/2012   GERD (gastroesophageal reflux disease)    Major depression in remission (HCC)    SVT  (supraventricular tachycardia) (HCC) 04/21/2012   Past Medical History:  Diagnosis Date   Arthritis    Breast cancer, right (HCC)    Colon cancer (HCC) 1990s, 2014   Colon polyps    Depression    Dysrhythmia 04/06/2012   GERD (gastroesophageal reflux disease)    Headache(784.0)    hx migraines   History of radiation therapy    Right breast- 05/31/22-06/30/22- Dr. Lynwood Nasuti   Hypertension 1999   Pituitary mass (HCC) 04/06/2012   MASS FOUND AFTER PT EXPERIENCED BILATERAL BLURRED / HAZY VISION -ESP LEFT EYE.  S/P CRANIOTOMY AND EYESIGHT RETURNED TO NORMAL--PT STILL HAS A LOT OF TENDERNESS RIGHT SIDE OF NOSE --THE CRANIOTOMY WAS DONE BY TRANSNASAL APPROACH   Pneumonia yrs ago   hx of pneumonia   PONV (postoperative nausea and vomiting)    PT STATES PLEASE NOTE SHE HAS TENDERNESS RIGHT SIDE OF NOSE - SINCE TRANSNASAL CRANIOTOMY 04/2012   Pre-diabetes    Prediabetes    Vitamin D  deficiency    Past Surgical History:  Procedure Laterality Date   BACK SURGERY     lower   BREAST LUMPECTOMY WITH RADIOACTIVE SEED LOCALIZATION Right 04/07/2022   Procedure: RIGHT BREAST LUMPECTOMY WITH RADIOACTIVE SEED LOCALIZATION X2;  Surgeon: Aron Shoulders, MD;  Location: [facility];  Service: General;  Laterality: Right;   CATARACT EXTRACTION  June/july 2013   bilateral eyes   COLON RESECTION  1988   COLON SURGERY  1990's   COLONOSCOPY  09/20/2011   Procedure: COLONOSCOPY;  Surgeon: Norleen BROCKS  Dyane, MD;  Location: [facility];  Service: Endoscopy;  Laterality: N/A;   COLONOSCOPY  12/15/2011   Procedure: COLONOSCOPY;  Surgeon: Norleen JAYSON Dyane, MD;  Location: [facility];  Service: Endoscopy;  Laterality: N/A;   COLONOSCOPY  01/26/2012   Procedure: COLONOSCOPY;  Surgeon: Lamar LULLA Bunk, MD;  Location: [facility];  Service: Endoscopy;  Laterality: N/A;   CRANIOTOMY  04/18/2012   Procedure: CRANIOTOMY HYPOPHYSECTOMY TRANSNASAL APPROACH;  Surgeon: Reyes JONETTA Budge, MD;  Location: [facility];  Service:  Neurosurgery;  Laterality: Bilateral;  Transphenoidal resection of Pituitary tumor.    CYSTOSCOPY WITH URETEROSCOPY AND STENT PLACEMENT Bilateral 10/01/2015   Procedure: CYSTOSCOPY, retrograde WITH URETEROSCOPY AND STENT PLACEMENT, ;  Surgeon: Gretel Ferrara, MD;  Location: [facility];  Service: Urology;  Laterality: Bilateral;   CYSTOSCOPY WITH URETEROSCOPY AND STENT PLACEMENT Right 10/19/2015   Procedure: CYSTOSCOPY,  RIGHT URETEROSCOPY, HOLMIUM LASER RIGHT URETERAL STONE, RIGHT URETERAL STENT PLACEMENT, LEFT URETEROSCOPY, INSERTION LEFT URETERAL STENT;  Surgeon: Gretel Ferrara, MD;  Location: [facility];  Service: Urology;  Laterality: Right;   EYE SURGERY     cataract bil   HOLMIUM LASER APPLICATION Right 10/19/2015   Procedure: HOLMIUM LASER  ;  Surgeon: Gretel Ferrara, MD;  Location: [facility];  Service: Urology;  Laterality: Right;   HOT HEMOSTASIS  09/20/2011   Procedure: HOT HEMOSTASIS (ARGON PLASMA COAGULATION/BICAP);  Surgeon: Norleen JAYSON Dyane, MD;  Location: [facility];  Service: Endoscopy;  Laterality: N/A;   LAPAROSCOPIC RIGHT HEMI COLECTOMY  06/27/2012   Procedure: LAPAROSCOPIC RIGHT HEMI COLECTOMY;  Surgeon: Donnice KATHEE Lunger, MD;  Location: [facility];  Service: General;  Laterality: N/A;  Laparoscopic Assited Right Hemicolectomy   TRANSNASAL APPROACH  04/18/2012   Procedure: TRANSNASAL APPROACH;  Surgeon: Lonni FORBES Angle, MD;  Location: [facility];  Service: ENT;  Laterality: Bilateral;  Transnasal approach   TUMOR REMOVAL     Left leg   Social History   Tobacco Use   Smoking status: Never   Smokeless tobacco: Never  Vaping Use   Vaping status: Never Used  Substance Use Topics   Alcohol use: Yes    Comment: a couple times a year, one drink at a time   Drug use: No   Social History   Socioeconomic History   Marital status: Widowed    Spouse name: Not on file   Number of children: 3   Years of education: Not on file   Highest education level: Not on file  Occupational History    Not on file  Tobacco Use   Smoking status: Never   Smokeless tobacco: Never  Vaping Use   Vaping status: Never Used  Substance and Sexual Activity   Alcohol use: Yes    Comment: a couple times a year, one drink at a time   Drug use: No   Sexual activity: Not Currently    Birth control/protection: Post-menopausal  Other Topics Concern   Not on file  Social History Narrative   Not on file   Social Drivers of Health   Financial Resource Strain: Low Risk  (05/17/2022)   Overall Financial Resource Strain (CARDIA)    Difficulty of Paying Living Expenses: Not hard at all  Food Insecurity: No Food Insecurity (05/17/2022)   Hunger Vital Sign    Worried About Running Out of Food in the Last Year: Never true    Ran Out of Food in the Last Year: Never true  Transportation Needs: Unmet Transportation Needs (03/06/2023)   PRAPARE -  Administrator, Civil Service (Medical): Yes    Lack of Transportation (Non-Medical): Yes  Physical Activity: Not on file  Stress: Not on file  Social Connections: Not on file  Intimate Partner Violence: Not At Risk (05/17/2022)   Humiliation, Afraid, Rape, and Kick questionnaire    Fear of Current or Ex-Partner: No    Emotionally Abused: No    Physically Abused: No    Sexually Abused: No   Family Status  Relation Name Status   Mother  Deceased at age 65   Father  Deceased at age 2   Sister Glenda Deceased at age 58   Sister Dickey Amble Alive   Mat Aunt  Deceased   Mat Uncle  Deceased   MGM  Deceased at age 71       heart attack   MGF  Deceased at age young   PGM  Deceased at age 18-39       Suicide, possible post partum depression   PGF  Deceased   Cousin  Deceased at age 57s   Cousin  Alive   Brother Brother Deceased at age 97   Son  Alive   Son  Deceased at age 63       gun shot   Son  Engineer, Civil (consulting) Deceased at age 33  No partnership data on file   Family History  Problem Relation Age of Onset   Heart disease Mother     Heart disease Father    Colon cancer Sister 11   Breast cancer Sister        in 85's   Dementia Sister    Cancer Maternal Aunt        unknown form of cancer dx in her 49s-50s   Liver cancer Maternal Uncle        heavy ETOH user   Colon cancer Cousin    Brain cancer Cousin        dx in his 56s; paternal cousin   Colon polyps Son    Colon polyps Son    Colon cancer Other 30   Colon polyps Other    Allergies  Allergen Reactions   Aspirin  Other (See Comments)    High Doses stomach trouble   Codeine Nausea And Vomiting   Fenofibrate  Cough   Gabapentin  Other (See Comments)    Drowsiness   Hydrochlorothiazide  Other (See Comments)    Fatigue   Metoclopramide Other (See Comments)    Sedation      ROS Negative unless indicated in HPI   Objective:     There were no vitals taken for this visit. BP Readings from Last 3 Encounters:  05/23/23 134/76  03/13/23 (!) 141/71  03/06/23 128/68   Wt Readings from Last 3 Encounters:  05/23/23 137 lb 3.2 oz (62.2 kg)  03/13/23 136 lb 3.2 oz (61.8 kg)  03/06/23 134 lb 9.6 oz (61.1 kg)      Physical Exam   No results found for any visits on 06/20/23.  Last CBC Lab Results  Component Value Date   WBC 7.1 03/06/2023   HGB 12.4 03/06/2023   HCT 38.1 03/06/2023   MCV 92 03/06/2023   MCH 30.0 03/06/2023   RDW 13.1 03/06/2023   PLT 247 03/06/2023   Last metabolic panel Lab Results  Component Value Date   GLUCOSE 86 03/06/2023   NA 142 03/06/2023   K 4.0 03/06/2023   CL 106 03/06/2023   CO2 23 03/06/2023  BUN 17 03/06/2023   CREATININE 0.81 03/06/2023   EGFR 72 03/06/2023   CALCIUM  9.7 03/06/2023   PHOS 3.6 08/19/2014   PROT 6.5 03/06/2023   ALBUMIN 4.3 03/06/2023   LABGLOB 2.2 03/06/2023   BILITOT 0.4 03/06/2023   ALKPHOS 80 03/06/2023   AST 15 03/06/2023   ALT 12 03/06/2023   ANIONGAP 7 02/27/2023   Last lipids Lab Results  Component Value Date   CHOL 189 03/06/2023   HDL 50 03/06/2023   LDLCALC 114  (H) 03/06/2023   TRIG 139 03/06/2023   CHOLHDL 3.8 03/06/2023   Last hemoglobin A1c Lab Results  Component Value Date   HGBA1C 5.6 11/29/2022   Last thyroid  functions Lab Results  Component Value Date   TSH 1.300 03/06/2023   T4TOTAL 8.1 03/06/2023        Assessment & Plan:  There are no diagnoses linked to this encounter. Continue healthy lifestyle choices, including diet (rich in fruits, vegetables, and lean proteins, and low in salt and simple carbohydrates) and exercise (at least 30 minutes of moderate physical activity daily).     The above assessment and management plan was discussed with the patient. The patient verbalized understanding of and has agreed to the management plan. Patient is aware to call the clinic if they develop any new symptoms or if symptoms persist or worsen. Patient is aware when to return to the clinic for a follow-up visit. Patient educated on when it is appropriate to go to the emergency department.  No follow-ups on file.    Rayquon Uselman [place] Thompson, DNP [facility] [address] [place], [place] [address] [phone]    Note: This document was prepared by [employer] voice dictation technology and any errors that results from this process are unintentional.

## 2023-06-20 ENCOUNTER — Ambulatory Visit: Payer: Medicare HMO | Admitting: Nurse Practitioner

## 2023-06-23 ENCOUNTER — Other Ambulatory Visit: Payer: Self-pay

## 2023-06-23 ENCOUNTER — Encounter (HOSPITAL_COMMUNITY): Payer: Self-pay | Admitting: *Deleted

## 2023-06-23 ENCOUNTER — Emergency Department (HOSPITAL_COMMUNITY)
Admission: EM | Admit: 2023-06-23 | Discharge: 2023-06-23 | Disposition: A | Discharge status: Home / Self Care | Payer: Medicare HMO

## 2023-06-23 DIAGNOSIS — F989 Unspecified behavioral and emotional disorders with onset usually occurring in childhood and adolescence: Secondary | ICD-10-CM | POA: Diagnosis not present

## 2023-06-23 DIAGNOSIS — R4189 Other symptoms and signs involving cognitive functions and awareness: Secondary | ICD-10-CM | POA: Insufficient documentation

## 2023-06-23 DIAGNOSIS — R4689 Other symptoms and signs involving appearance and behavior: Secondary | ICD-10-CM | POA: Diagnosis not present

## 2023-06-23 DIAGNOSIS — R531 Weakness: Secondary | ICD-10-CM | POA: Diagnosis not present

## 2023-06-23 LAB — CBC
HCT: 34.1 % — ABNORMAL LOW (ref 36.0–46.0)
Hemoglobin: 11.1 g/dL — ABNORMAL LOW (ref 12.0–15.0)
MCH: 29.4 pg (ref 26.0–34.0)
MCHC: 32.6 g/dL (ref 30.0–36.0)
MCV: 90.2 fL (ref 80.0–100.0)
Platelets: 196 10*3/uL (ref 150–400)
RBC: 3.78 MIL/uL — ABNORMAL LOW (ref 3.87–5.11)
RDW: 12.8 % (ref 11.5–15.5)
WBC: 4.4 10*3/uL (ref 4.0–10.5)
nRBC: 0 % (ref 0.0–0.2)

## 2023-06-23 LAB — COMPREHENSIVE METABOLIC PANEL
ALT: 11 U/L (ref 0–44)
AST: 13 U/L — ABNORMAL LOW (ref 15–41)
Albumin: 3.5 g/dL (ref 3.5–5.0)
Alkaline Phosphatase: 54 U/L (ref 38–126)
Anion gap: 4 — ABNORMAL LOW (ref 5–15)
BUN: 13 mg/dL (ref 8–23)
CO2: 25 mmol/L (ref 22–32)
Calcium: 8.8 mg/dL — ABNORMAL LOW (ref 8.9–10.3)
Chloride: 108 mmol/L (ref 98–111)
Creatinine, Ser: 0.69 mg/dL (ref 0.44–1.00)
GFR, Estimated: >60 mL/min (ref >60)
Glucose, Bld: 95 mg/dL (ref 70–99)
Potassium: 3.9 mmol/L (ref 3.5–5.1)
Sodium: 137 mmol/L (ref 135–145)
Total Bilirubin: 0.6 mg/dL (ref 0.0–1.2)
Total Protein: 6.1 g/dL — ABNORMAL LOW (ref 6.5–8.1)

## 2023-06-23 LAB — URINALYSIS, ROUTINE W REFLEX MICROSCOPIC
Bilirubin Urine: NEGATIVE
Glucose, UA: NEGATIVE mg/dL
Hgb urine dipstick: NEGATIVE
Ketones, ur: NEGATIVE mg/dL
Leukocytes,Ua: NEGATIVE
Nitrite: NEGATIVE
Protein, ur: NEGATIVE mg/dL
Specific Gravity, Urine: 1.012 (ref 1.005–1.030)
pH: 7 (ref 5.0–8.0)

## 2023-06-23 NOTE — ED Triage Notes (Addendum)
Pt brought in by her son with c/o generalized weakness, decreased appetite, and feeling sad and as if "no one wants to help me anymore" x 1 month. Last night pt started talking about suicide so her son decided to bring her in for evaluation. Pt denies a plan for suicide and reports she feels like that because her medication causes her to feel that way. Pt reports that medication was for breast cancer and she hasn't taken it for months, but still reports she has "bad thoughts". Son reports she is so weak that she is almost falling over.

## 2023-06-23 NOTE — ED Provider Notes (Signed)
[place] EMERGENCY DEPARTMENT AT [facility] Provider Note   CSN: [id number] Arrival date & time: 06/23/23  1032     History  Chief Complaint  Patient presents with   Weakness   Suicidal    Kristen [name] is a 83 y.o. female.  This is a 83 year old female presenting emergency department for psych evaluation for suicidal ideation.  No physical complaints although some reported generalized weakness with triage note.  Reports episodic and feels normal self currently.  Son is in the room to provide more information.  He reports that over the past month or so patient has had these episodic episodes of behavioral disturbances with crying and threatening suicide.  They are seemingly worse at night and in the morning, but patient fine the day.  Has had some cognitive issues as well over the past several months.  Patient currently is alert and oriented she has no specific complaints.  Denies suicidal ideation, denies homicidal ideation, and hallucination.  No toxic ingestion prior to arrival.  Denies drugs and alcohol.  Son notes that patient has not been taking blood pressure medicine   Weakness      Home Medications Prior to Admission medications   Medication Sig Start Date End Date Taking? Authorizing Provider  Cholecalciferol 50 MCG (2000 UT) CHEW Chew 6,000 Units by mouth daily. Gummies   Yes [provider]  meclizine (ANTIVERT) 25 MG tablet Take 1 tablet 2 to 3 x /day as needed for Vertigo 08/14/22  Yes Lucky Cowboy, MD  letrozole [facility]) 2.5 MG tablet Take 1 tablet (2.5 mg total) by mouth daily. Patient not taking: Reported on 06/23/2023 03/13/23   Ladene Artist, MD  olmesartan (BENICAR) 40 MG tablet Take 1 tablet at Night for BP                                                                   /                                                                   TAKE                                         BY                                                  MOUTH Patient not taking: Reported on 06/23/2023 03/06/23   Lucky Cowboy, MD      Allergies    Aspirin, Fenofibrate, Gabapentin, Hydrochlorothiazide, Metoclopramide, and Codeine    Review of Systems   Review of Systems  Neurological:  Positive for weakness.    Physical Exam Updated Vital Signs BP (!) 181/80   Pulse (!) 51   Temp 97.9 F (36.6 C) (Oral)   Resp  13   Ht 5\' 6"  (1.676 m)   Wt 62.1 kg   SpO2 96%   BMI 22.11 kg/m  Physical Exam Vitals and nursing note reviewed.  Constitutional:      General: She is not in acute distress.    Appearance: She is not toxic-appearing.  HENT:     Head: Normocephalic.     Nose: Nose normal.  Eyes:     Conjunctiva/sclera: Conjunctivae normal.  Cardiovascular:     Rate and Rhythm: Normal rate and regular rhythm.  Pulmonary:     Effort: Pulmonary effort is normal.     Breath sounds: Normal breath sounds.  Abdominal:     General: Abdomen is flat. There is no distension.     Palpations: Abdomen is soft.     Tenderness: There is no abdominal tenderness. There is no guarding or rebound.  Musculoskeletal:        General: Normal range of motion.  Skin:    General: Skin is warm.  Neurological:     Mental Status: She is alert and oriented to person, place, and time.  Psychiatric:        Attention and Perception: Attention and perception normal.        Mood and Affect: Mood and affect normal.        Speech: Speech normal.        Behavior: Behavior normal. Behavior is cooperative.        Thought Content: Thought content is not paranoid. Thought content does not include homicidal or suicidal ideation. Thought content does not include homicidal or suicidal plan.     ED Results / Procedures / Treatments   Labs (all labs ordered are listed, but only abnormal results are displayed) Labs Reviewed  CBC - Abnormal; Notable for the following components:      Result Value   RBC 3.78 (*)    Hemoglobin 11.1 (*)    HCT 34.1 (*)    All  other components within normal limits  COMPREHENSIVE METABOLIC PANEL - Abnormal; Notable for the following components:   Calcium 8.8 (*)    Total Protein 6.1 (*)    AST 13 (*)    Anion gap 4 (*)    All other components within normal limits  URINALYSIS, ROUTINE W REFLEX MICROSCOPIC    EKG None  Radiology No results found.  Procedures Procedures    Medications Ordered in ED Medications - No data to display  ED Course/ Medical Decision Making/ A&P Clinical Course as of 06/23/23 1602  Fri Jun 23, 2023  1442 Workup reassuring.  No leukocytosis to suggest systemic infection.  Mild anemia noted, but no indication to transfuse.  Comprehensive metabolic panel with no significant metabolic derangements.  No transaminitis to suggest hepatic encephalopathy.  UA negative for acute pathology.  Patient is alert and orient x 3, continues to be without behavioral disturbances and adamantly denies SI, HI or hallucinations.  Feel that she is safe for discharge at this time to follow-up with primary doctor. Son feels safe with plan.  [TY]    Clinical Course User Index [TY] Coral Spikes, DO                                 Medical Decision Making This is a well-appearing 83 year old female presenting emergency department for psych evaluation.  She is bradycardic, afebrile, hypertensive maintaining ox saturation on room air.  She has no specific  complaint currently.  Denying SI, HI or hallucinations.  She cannot fully elaborate what happens during these episodes son states are typically worse at night and in the morning.  Per chart review saw PCP who referred her to psychiatry and is noted in her chart that she has history of depression.  Does not appear per further chart review that she takes any depression medications.  I had a long discussion with patient and patient's son that symptoms could be secondary to underlying dementia given her cognitive decline and the timing of her symptoms typically  with sundowning type effect.  Will get basic screening labs, but given patient is not actively SI, HI or having hallucinations there is no indication for inpatient psych at this time.  Encouraged to follow-up with their psychiatrist they have been referred to.  Patient's son states that family does stay with her and that they do feel safe taking her home.  See ED course for further MDM and final disposition.  Amount and/or Complexity of Data Reviewed Labs: ordered. ECG/medicine tests: ordered.          Final Clinical Impression(s) / ED Diagnoses Final diagnoses:  Cognitive and behavioral changes    Rx / DC Orders ED Discharge Orders     None         Coral Spikes, DO 06/23/23 1602

## 2023-06-23 NOTE — Discharge Instructions (Signed)
As discussed please follow-up with your primary doctor and psychiatrist.  Return immediately if you develop fevers, chills, chest pain, shortness of breath.  Return if you develop return of suicidal thoughts or you feel unsafe at home.

## 2023-06-25 NOTE — Progress Notes (Deleted)
Established Patient Office Visit  Subjective  Patient ID: Kristen [name], female    DOB: 19-Mar-1941  Age: 83 y.o. MRN: [id number]  No chief complaint on file.   HPI Nakaila FATEEMA MOTOLA Depression, Follow-up  She  was last seen for this {NUMBERS 1-12:18279} {days/wks/mos/yrs:310907} ago. Changes made at last visit include ***.   She reports {excellent/good/fair/poor:19665} compliance with treatment. She {is/is not:21021397} having side effects. ***  She reports {DESC; GOOD/FAIR/POOR:18685} tolerance of treatment. Current symptoms include: {Symptoms; depression:1002} She feels she is {improved/worse/unchanged:3041574} since last visit.     05/23/2023    2:37 PM 03/06/2023    3:46 PM 03/06/2023    3:17 PM  Depression screen PHQ 2/9  Decreased Interest 0  0  Down, Depressed, Hopeless 3 0 2  PHQ - 2 Score 3 0 2  Altered sleeping 3 2 0  Tired, decreased energy 3 0 2  Change in appetite 3 2 2   Feeling bad or failure about yourself  3 2 3   Trouble concentrating 3 3 3   Moving slowly or fidgety/restless 3 2 2   Suicidal thoughts 3 2 2   PHQ-9 Score 24 13 16   Difficult doing work/chores Somewhat difficult Somewhat difficult Somewhat difficult     Patient Active Problem List   Diagnosis Date Noted   Moderate episode of recurrent major depressive disorder (HCC) 03/06/2023   GAD (generalized anxiety disorder) 03/06/2023   Encounter for general adult medical examination with abnormal findings 03/06/2023   Breast cancer (HCC) 05/11/2022   Osteopenia 11/23/2020   Bradycardia 04/20/2020   Abnormal glucose 06/25/2019   Essential hypertension 08/19/2014   Pituitary tumor (excised 2013) 03/12/2014   Medication management 09/10/2013   Hyperlipidemia, mixed 06/11/2013   Vitamin D deficiency    Prediabetes    History of colon cancer, stage II 10/12/2012   Overweight (BMI 25.0-29.9) 07/01/2012   GERD (gastroesophageal reflux disease)    Major depression in remission (HCC)    SVT  (supraventricular tachycardia) (HCC) 04/21/2012   Past Medical History:  Diagnosis Date   Arthritis    Breast cancer, right (HCC)    Colon cancer (HCC) 1990s, 2014   Colon polyps    Depression    Dysrhythmia 04/06/2012   GERD (gastroesophageal reflux disease)    Headache(784.0)    hx migraines   History of radiation therapy    Right breast- 05/31/22-06/30/22- Dr. Antony Blackbird   Hypertension 1999   Pituitary mass (HCC) 04/06/2012   MASS FOUND AFTER PT EXPERIENCED BILATERAL BLURRED / HAZY VISION -ESP LEFT EYE.  S/P CRANIOTOMY AND EYESIGHT RETURNED TO NORMAL--PT STILL HAS A LOT OF TENDERNESS RIGHT SIDE OF NOSE --THE CRANIOTOMY WAS DONE BY TRANSNASAL APPROACH   Pneumonia yrs ago   hx of pneumonia   PONV (postoperative nausea and vomiting)    PT STATES PLEASE NOTE SHE HAS TENDERNESS RIGHT SIDE OF NOSE - SINCE TRANSNASAL CRANIOTOMY 04/2012   Pre-diabetes    Prediabetes    Vitamin D deficiency    Past Surgical History:  Procedure Laterality Date   BACK SURGERY     lower   BREAST LUMPECTOMY WITH RADIOACTIVE SEED LOCALIZATION Right 04/07/2022   Procedure: RIGHT BREAST LUMPECTOMY WITH RADIOACTIVE SEED LOCALIZATION X2;  Surgeon: Almond Lint, MD;  Location: [facility];  Service: General;  Laterality: Right;   CATARACT EXTRACTION  June/july 2013   bilateral eyes   COLON RESECTION  1988   COLON SURGERY  1990's   COLONOSCOPY  09/20/2011   Procedure: COLONOSCOPY;  Surgeon: Everardo All  Madilyn Fireman, MD;  Location: [facility];  Service: Endoscopy;  Laterality: N/A;   COLONOSCOPY  12/15/2011   Procedure: COLONOSCOPY;  Surgeon: Barrie Folk, MD;  Location: [facility];  Service: Endoscopy;  Laterality: N/A;   COLONOSCOPY  01/26/2012   Procedure: COLONOSCOPY;  Surgeon: Florencia Reasons, MD;  Location: [facility];  Service: Endoscopy;  Laterality: N/A;   CRANIOTOMY  04/18/2012   Procedure: CRANIOTOMY HYPOPHYSECTOMY TRANSNASAL APPROACH;  Surgeon: Cristi Loron, MD;  Location: [facility];  Service:  Neurosurgery;  Laterality: Bilateral;  Transphenoidal resection of Pituitary tumor.    CYSTOSCOPY WITH URETEROSCOPY AND STENT PLACEMENT Bilateral 10/01/2015   Procedure: CYSTOSCOPY, retrograde WITH URETEROSCOPY AND STENT PLACEMENT, ;  Surgeon: Heloise Purpura, MD;  Location: [facility];  Service: Urology;  Laterality: Bilateral;   CYSTOSCOPY WITH URETEROSCOPY AND STENT PLACEMENT Right 10/19/2015   Procedure: CYSTOSCOPY,  RIGHT URETEROSCOPY, HOLMIUM LASER RIGHT URETERAL STONE, RIGHT URETERAL STENT PLACEMENT, LEFT URETEROSCOPY, INSERTION LEFT URETERAL STENT;  Surgeon: Heloise Purpura, MD;  Location: [facility];  Service: Urology;  Laterality: Right;   EYE SURGERY     cataract bil   HOLMIUM LASER APPLICATION Right 10/19/2015   Procedure: HOLMIUM LASER  ;  Surgeon: Heloise Purpura, MD;  Location: [facility];  Service: Urology;  Laterality: Right;   HOT HEMOSTASIS  09/20/2011   Procedure: HOT HEMOSTASIS (ARGON PLASMA COAGULATION/BICAP);  Surgeon: Barrie Folk, MD;  Location: [facility];  Service: Endoscopy;  Laterality: N/A;   LAPAROSCOPIC RIGHT HEMI COLECTOMY  06/27/2012   Procedure: LAPAROSCOPIC RIGHT HEMI COLECTOMY;  Surgeon: Valarie Merino, MD;  Location: [facility];  Service: General;  Laterality: N/A;  Laparoscopic Assited Right Hemicolectomy   TRANSNASAL APPROACH  04/18/2012   Procedure: TRANSNASAL APPROACH;  Surgeon: Drema Halon, MD;  Location: [facility];  Service: ENT;  Laterality: Bilateral;  Transnasal approach   TUMOR REMOVAL     Left leg   Social History   Tobacco Use   Smoking status: Never   Smokeless tobacco: Never  Vaping Use   Vaping status: Never Used  Substance Use Topics   Alcohol use: Not Currently    Comment: a couple times a year, one drink at a time   Drug use: No   Social History   Socioeconomic History   Marital status: Widowed    Spouse name: Not on file   Number of children: 3   Years of education: Not on file   Highest education level: Not on file  Occupational  History   Not on file  Tobacco Use   Smoking status: Never   Smokeless tobacco: Never  Vaping Use   Vaping status: Never Used  Substance and Sexual Activity   Alcohol use: Not Currently    Comment: a couple times a year, one drink at a time   Drug use: No   Sexual activity: Not Currently    Birth control/protection: Post-menopausal  Other Topics Concern   Not on file  Social History Narrative   Not on file   Social Drivers of Health   Financial Resource Strain: Low Risk  (05/17/2022)   Overall Financial Resource Strain (CARDIA)    Difficulty of Paying Living Expenses: Not hard at all  Food Insecurity: No Food Insecurity (05/17/2022)   Hunger Vital Sign    Worried About Running Out of Food in the Last Year: Never true    Ran Out of Food in the Last Year: Never true  Transportation Needs: Unmet Transportation Needs (03/06/2023)  PRAPARE - Administrator, Civil Service (Medical): Yes    Lack of Transportation (Non-Medical): Yes  Physical Activity: Not on file  Stress: Not on file  Social Connections: Not on file  Intimate Partner Violence: Not At Risk (05/17/2022)   Humiliation, Afraid, Rape, and Kick questionnaire    Fear of Current or Ex-Partner: No    Emotionally Abused: No    Physically Abused: No    Sexually Abused: No   Family Status  Relation Name Status   Mother  Deceased at age 41   Father  Deceased at age 67   Sister Glenda Deceased at age 65   Sister Teresa Coombs Alive   Mat Aunt  Deceased   Mat Uncle  Deceased   MGM  Deceased at age 101       heart attack   MGF  Deceased at age "young"   PGM  Deceased at age 45-39       Suicide, possible post partum depression   PGF  Deceased   Cousin  Deceased at age 49s   Cousin  Alive   Brother "Brother" Deceased at age 68   Son  Alive   Son  Deceased at age 57       gun shot   Son  Engineer, civil (consulting) Deceased at age 41  No partnership data on file   Family History  Problem Relation Age of Onset    Heart disease Mother    Heart disease Father    Colon cancer Sister 42   Breast cancer Sister        in 35's   Dementia Sister    Cancer Maternal Aunt        unknown form of cancer dx in her 46s-50s   Liver cancer Maternal Uncle        heavy ETOH user   Colon cancer Cousin    Brain cancer Cousin        dx in his 58s; paternal cousin   Colon polyps Son    Colon polyps Son    Colon cancer Other 30   Colon polyps Other    Allergies  Allergen Reactions   Aspirin Other (See Comments)    High Doses stomach trouble   Fenofibrate Cough   Gabapentin Other (See Comments)    Drowsiness   Hydrochlorothiazide Other (See Comments)    Fatigue   Metoclopramide Other (See Comments)    Sedation   Codeine Nausea And Vomiting, Nausea Only and Rash      ROS Negative unless indicated in HPI   Objective:     There were no vitals taken for this visit. BP Readings from Last 3 Encounters:  06/23/23 (!) 181/80  05/23/23 134/76  03/13/23 (!) 141/71   Wt Readings from Last 3 Encounters:  06/23/23 137 lb (62.1 kg)  05/23/23 137 lb 3.2 oz (62.2 kg)  03/13/23 136 lb 3.2 oz (61.8 kg)      Physical Exam   No results found for any visits on 06/26/23.  Last CBC Lab Results  Component Value Date   WBC 4.4 06/23/2023   HGB 11.1 (L) 06/23/2023   HCT 34.1 (L) 06/23/2023   MCV 90.2 06/23/2023   MCH 29.4 06/23/2023   RDW 12.8 06/23/2023   PLT 196 06/23/2023   Last metabolic panel Lab Results  Component Value Date   GLUCOSE 95 06/23/2023   NA 137 06/23/2023   K 3.9 06/23/2023   CL 108 06/23/2023  CO2 25 06/23/2023   BUN 13 06/23/2023   CREATININE 0.69 06/23/2023   EGFR 72 03/06/2023   CALCIUM 8.8 (L) 06/23/2023   PHOS 3.6 08/19/2014   PROT 6.1 (L) 06/23/2023   ALBUMIN 3.5 06/23/2023   LABGLOB 2.2 03/06/2023   BILITOT 0.6 06/23/2023   ALKPHOS 54 06/23/2023   AST 13 (L) 06/23/2023   ALT 11 06/23/2023   ANIONGAP 4 (L) 06/23/2023   Last lipids Lab Results  Component  Value Date   CHOL 189 03/06/2023   HDL 50 03/06/2023   LDLCALC 114 (H) 03/06/2023   TRIG 139 03/06/2023   CHOLHDL 3.8 03/06/2023   Last hemoglobin A1c Lab Results  Component Value Date   HGBA1C 5.6 11/29/2022   Last thyroid functions Lab Results  Component Value Date   TSH 1.300 03/06/2023   T4TOTAL 8.1 03/06/2023        Assessment & Plan:  There are no diagnoses linked to this encounter. Continue healthy lifestyle choices, including diet (rich in fruits, vegetables, and lean proteins, and low in salt and simple carbohydrates) and exercise (at least 30 minutes of moderate physical activity daily).     The above assessment and management plan was discussed with the patient. The patient verbalized understanding of and has agreed to the management plan. Patient is aware to call the clinic if they develop any new symptoms or if symptoms persist or worsen. Patient is aware when to return to the clinic for a follow-up visit. Patient educated on when it is appropriate to go to the emergency department.  No follow-ups on file.    Arrie Aran Santa Lighter, [facility] [address] [place], [place] [address] [phone]    Note: This document was prepared by [employer] Milan voice dictation technology and any errors that results from this process are unintentional.

## 2023-06-26 ENCOUNTER — Ambulatory Visit: Payer: Medicare HMO | Admitting: Nurse Practitioner

## 2023-06-26 ENCOUNTER — Encounter: Payer: Self-pay | Admitting: Nurse Practitioner

## 2023-06-28 DIAGNOSIS — F411 Generalized anxiety disorder: Secondary | ICD-10-CM | POA: Diagnosis not present

## 2023-06-28 DIAGNOSIS — I10 Essential (primary) hypertension: Secondary | ICD-10-CM | POA: Diagnosis not present

## 2023-07-31 DIAGNOSIS — I10 Essential (primary) hypertension: Secondary | ICD-10-CM | POA: Diagnosis not present

## 2023-07-31 DIAGNOSIS — R413 Other amnesia: Secondary | ICD-10-CM | POA: Diagnosis not present

## 2023-07-31 DIAGNOSIS — F411 Generalized anxiety disorder: Secondary | ICD-10-CM | POA: Diagnosis not present

## 2023-08-10 ENCOUNTER — Other Ambulatory Visit: Payer: Self-pay | Admitting: Nurse Practitioner

## 2023-08-22 DIAGNOSIS — F411 Generalized anxiety disorder: Secondary | ICD-10-CM | POA: Diagnosis not present

## 2023-08-22 DIAGNOSIS — R413 Other amnesia: Secondary | ICD-10-CM | POA: Diagnosis not present

## 2023-08-22 DIAGNOSIS — I10 Essential (primary) hypertension: Secondary | ICD-10-CM | POA: Diagnosis not present

## 2023-09-11 ENCOUNTER — Inpatient Hospital Stay: Payer: PPO | Attending: Oncology | Admitting: Oncology

## 2023-09-12 ENCOUNTER — Encounter: Payer: Self-pay | Admitting: *Deleted

## 2023-09-12 NOTE — Progress Notes (Signed)
 Ms. Tillison was "no show" for her office visit on 09/11/23. Scheduling message sent to reschedule for 1 month.

## 2023-09-13 ENCOUNTER — Telehealth: Payer: Self-pay | Admitting: Oncology

## 2023-09-13 NOTE — Telephone Encounter (Signed)
 LMOM for patient to return call to reschedule no show appt on 09/11/2023

## 2023-09-18 ENCOUNTER — Telehealth: Payer: Self-pay | Admitting: Oncology

## 2023-09-18 NOTE — Telephone Encounter (Signed)
 Attempted to contact patient to reschedule appt in 1 month with Dr. Scherrie Curt

## 2023-09-18 NOTE — Telephone Encounter (Signed)
 Called to reschedule missed OV appt. Left message with pt's neice to return call for scheduling.

## 2023-09-22 ENCOUNTER — Telehealth: Payer: Self-pay | Admitting: Oncology

## 2023-09-22 NOTE — Telephone Encounter (Signed)
 Called to reschedule missed appointments. Phone was answered and then disconnected after asking for patient. 4th attempt to reach out, closing inbasket.

## 2023-10-19 DIAGNOSIS — F411 Generalized anxiety disorder: Secondary | ICD-10-CM | POA: Diagnosis not present

## 2023-11-23 ENCOUNTER — Ambulatory Visit: Payer: Medicare HMO | Admitting: Nurse Practitioner

## 2024-01-23 DIAGNOSIS — R413 Other amnesia: Secondary | ICD-10-CM | POA: Diagnosis not present

## 2024-01-23 DIAGNOSIS — R42 Dizziness and giddiness: Secondary | ICD-10-CM | POA: Diagnosis not present

## 2024-01-23 DIAGNOSIS — F331 Major depressive disorder, recurrent, moderate: Secondary | ICD-10-CM | POA: Diagnosis not present

## 2024-01-23 DIAGNOSIS — F411 Generalized anxiety disorder: Secondary | ICD-10-CM | POA: Diagnosis not present

## 2024-01-23 DIAGNOSIS — I10 Essential (primary) hypertension: Secondary | ICD-10-CM | POA: Diagnosis not present

## 2024-02-13 DIAGNOSIS — I10 Essential (primary) hypertension: Secondary | ICD-10-CM | POA: Diagnosis not present

## 2024-02-13 DIAGNOSIS — F411 Generalized anxiety disorder: Secondary | ICD-10-CM | POA: Diagnosis not present

## 2024-02-13 DIAGNOSIS — R413 Other amnesia: Secondary | ICD-10-CM | POA: Diagnosis not present

## 2024-02-29 DIAGNOSIS — E860 Dehydration: Secondary | ICD-10-CM | POA: Diagnosis not present

## 2024-02-29 DIAGNOSIS — R42 Dizziness and giddiness: Secondary | ICD-10-CM | POA: Diagnosis not present

## 2024-02-29 DIAGNOSIS — N390 Urinary tract infection, site not specified: Secondary | ICD-10-CM | POA: Diagnosis not present

## 2024-02-29 DIAGNOSIS — R413 Other amnesia: Secondary | ICD-10-CM | POA: Diagnosis not present

## 2024-03-07 DIAGNOSIS — E860 Dehydration: Secondary | ICD-10-CM | POA: Diagnosis not present

## 2024-03-07 DIAGNOSIS — R413 Other amnesia: Secondary | ICD-10-CM | POA: Diagnosis not present

## 2024-03-07 DIAGNOSIS — N390 Urinary tract infection, site not specified: Secondary | ICD-10-CM | POA: Diagnosis not present

## 2024-03-07 DIAGNOSIS — R42 Dizziness and giddiness: Secondary | ICD-10-CM | POA: Diagnosis not present

## 2024-03-27 ENCOUNTER — Other Ambulatory Visit: Payer: Self-pay | Admitting: Physician Assistant

## 2024-03-27 DIAGNOSIS — Z9889 Other specified postprocedural states: Secondary | ICD-10-CM

## 2024-04-09 ENCOUNTER — Encounter: Payer: Medicare HMO | Admitting: Internal Medicine

## 2024-04-19 ENCOUNTER — Ambulatory Visit
Admission: RE | Admit: 2024-04-19 | Discharge: 2024-04-19 | Disposition: A | Discharge status: Home / Self Care | Source: Ambulatory Visit | Attending: Physician Assistant | Admitting: Physician Assistant

## 2024-04-19 DIAGNOSIS — Z9889 Other specified postprocedural states: Secondary | ICD-10-CM

## 2024-04-19 DIAGNOSIS — N644 Mastodynia: Secondary | ICD-10-CM | POA: Diagnosis not present

## 2024-05-27 DIAGNOSIS — R42 Dizziness and giddiness: Secondary | ICD-10-CM | POA: Diagnosis not present

## 2024-05-27 DIAGNOSIS — E860 Dehydration: Secondary | ICD-10-CM | POA: Diagnosis not present
